# Patient Record
Sex: Female | Born: 1975 | Race: White | Hispanic: Yes | Marital: Married | Smoking: Never smoker
Health system: Southern US, Community
[De-identification: ages and names within clinical notes are randomized; demographics above are authoritative.]

## PROBLEM LIST (undated history)

## (undated) DIAGNOSIS — E119 Type 2 diabetes mellitus without complications: Secondary | ICD-10-CM

## (undated) DIAGNOSIS — R87619 Unspecified abnormal cytological findings in specimens from cervix uteri: Secondary | ICD-10-CM

## (undated) DIAGNOSIS — K219 Gastro-esophageal reflux disease without esophagitis: Secondary | ICD-10-CM

## (undated) DIAGNOSIS — Z9109 Other allergy status, other than to drugs and biological substances: Secondary | ICD-10-CM

## (undated) DIAGNOSIS — J45909 Unspecified asthma, uncomplicated: Secondary | ICD-10-CM

## (undated) HISTORY — PX: CERVICAL BIOPSY  W/ LOOP ELECTRODE EXCISION: SUR135

---

## 2001-05-21 ENCOUNTER — Encounter: Payer: Self-pay | Admitting: Emergency Medicine

## 2001-05-21 ENCOUNTER — Emergency Department (HOSPITAL_COMMUNITY): Admission: EM | Admit: 2001-05-21 | Discharge: 2001-05-22 | Payer: Self-pay | Admitting: Emergency Medicine

## 2001-06-19 ENCOUNTER — Emergency Department (HOSPITAL_COMMUNITY): Admission: EM | Admit: 2001-06-19 | Discharge: 2001-06-19 | Payer: Self-pay

## 2001-06-20 ENCOUNTER — Emergency Department (HOSPITAL_COMMUNITY): Admission: EM | Admit: 2001-06-20 | Discharge: 2001-06-20 | Payer: Self-pay | Admitting: Emergency Medicine

## 2001-07-25 ENCOUNTER — Emergency Department (HOSPITAL_COMMUNITY): Admission: EM | Admit: 2001-07-25 | Discharge: 2001-07-26 | Payer: Self-pay | Admitting: Emergency Medicine

## 2001-09-14 ENCOUNTER — Encounter: Payer: Self-pay | Admitting: *Deleted

## 2001-09-14 ENCOUNTER — Inpatient Hospital Stay (HOSPITAL_COMMUNITY): Admission: AD | Admit: 2001-09-14 | Discharge: 2001-09-14 | Payer: Self-pay | Admitting: Obstetrics & Gynecology

## 2001-10-21 ENCOUNTER — Emergency Department (HOSPITAL_COMMUNITY): Admission: EM | Admit: 2001-10-21 | Discharge: 2001-10-21 | Payer: Self-pay | Admitting: Emergency Medicine

## 2001-12-27 ENCOUNTER — Other Ambulatory Visit: Admission: RE | Admit: 2001-12-27 | Discharge: 2001-12-27 | Payer: Self-pay | Admitting: Obstetrics and Gynecology

## 2002-01-23 ENCOUNTER — Encounter: Admission: RE | Admit: 2002-01-23 | Discharge: 2002-04-23 | Payer: Self-pay | Admitting: Obstetrics and Gynecology

## 2002-02-05 ENCOUNTER — Inpatient Hospital Stay (HOSPITAL_COMMUNITY): Admission: AD | Admit: 2002-02-05 | Discharge: 2002-02-05 | Payer: Self-pay | Admitting: Obstetrics and Gynecology

## 2002-02-07 ENCOUNTER — Inpatient Hospital Stay (HOSPITAL_COMMUNITY): Admission: AD | Admit: 2002-02-07 | Discharge: 2002-02-07 | Payer: Self-pay | Admitting: *Deleted

## 2002-02-08 ENCOUNTER — Encounter: Payer: Self-pay | Admitting: *Deleted

## 2002-02-08 ENCOUNTER — Observation Stay (HOSPITAL_COMMUNITY): Admission: AD | Admit: 2002-02-08 | Discharge: 2002-02-09 | Payer: Self-pay | Admitting: *Deleted

## 2002-02-12 ENCOUNTER — Encounter (HOSPITAL_COMMUNITY): Admission: RE | Admit: 2002-02-12 | Discharge: 2002-02-12 | Payer: Self-pay | Admitting: *Deleted

## 2002-02-14 ENCOUNTER — Inpatient Hospital Stay (HOSPITAL_COMMUNITY): Admission: AD | Admit: 2002-02-14 | Discharge: 2002-02-16 | Payer: Self-pay | Admitting: *Deleted

## 2002-05-16 ENCOUNTER — Emergency Department (HOSPITAL_COMMUNITY): Admission: EM | Admit: 2002-05-16 | Discharge: 2002-05-16 | Payer: Self-pay | Admitting: *Deleted

## 2003-01-03 ENCOUNTER — Encounter: Payer: Self-pay | Admitting: Emergency Medicine

## 2003-01-03 ENCOUNTER — Emergency Department (HOSPITAL_COMMUNITY): Admission: EM | Admit: 2003-01-03 | Discharge: 2003-01-04 | Payer: Self-pay | Admitting: Emergency Medicine

## 2003-11-13 ENCOUNTER — Emergency Department (HOSPITAL_COMMUNITY): Admission: EM | Admit: 2003-11-13 | Discharge: 2003-11-13 | Payer: Self-pay | Admitting: Emergency Medicine

## 2003-12-02 ENCOUNTER — Encounter: Admission: RE | Admit: 2003-12-02 | Discharge: 2003-12-02 | Payer: Self-pay | Admitting: Obstetrics and Gynecology

## 2003-12-29 ENCOUNTER — Ambulatory Visit (HOSPITAL_COMMUNITY): Admission: RE | Admit: 2003-12-29 | Discharge: 2003-12-29 | Payer: Self-pay | Admitting: Obstetrics and Gynecology

## 2003-12-29 ENCOUNTER — Encounter (INDEPENDENT_AMBULATORY_CARE_PROVIDER_SITE_OTHER): Payer: Self-pay | Admitting: *Deleted

## 2004-01-04 ENCOUNTER — Inpatient Hospital Stay (HOSPITAL_COMMUNITY): Admission: AD | Admit: 2004-01-04 | Discharge: 2004-01-04 | Payer: Self-pay | Admitting: Family Medicine

## 2004-01-07 ENCOUNTER — Emergency Department (HOSPITAL_COMMUNITY): Admission: EM | Admit: 2004-01-07 | Discharge: 2004-01-07 | Payer: Self-pay | Admitting: Emergency Medicine

## 2004-03-28 ENCOUNTER — Emergency Department (HOSPITAL_COMMUNITY): Admission: EM | Admit: 2004-03-28 | Discharge: 2004-03-28 | Payer: Self-pay | Admitting: Emergency Medicine

## 2004-07-16 ENCOUNTER — Ambulatory Visit: Payer: Self-pay | Admitting: Family Medicine

## 2004-08-12 ENCOUNTER — Emergency Department (HOSPITAL_COMMUNITY): Admission: EM | Admit: 2004-08-12 | Discharge: 2004-08-13 | Payer: Self-pay | Admitting: Emergency Medicine

## 2004-11-12 ENCOUNTER — Emergency Department (HOSPITAL_COMMUNITY): Admission: EM | Admit: 2004-11-12 | Discharge: 2004-11-12 | Payer: Self-pay | Admitting: Emergency Medicine

## 2004-11-19 ENCOUNTER — Emergency Department (HOSPITAL_COMMUNITY): Admission: EM | Admit: 2004-11-19 | Discharge: 2004-11-19 | Payer: Self-pay | Admitting: Emergency Medicine

## 2004-12-31 ENCOUNTER — Emergency Department (HOSPITAL_COMMUNITY): Admission: EM | Admit: 2004-12-31 | Discharge: 2005-01-01 | Payer: Self-pay | Admitting: Emergency Medicine

## 2005-08-27 ENCOUNTER — Emergency Department (HOSPITAL_COMMUNITY): Admission: EM | Admit: 2005-08-27 | Discharge: 2005-08-28 | Payer: Self-pay | Admitting: Emergency Medicine

## 2005-10-11 ENCOUNTER — Emergency Department (HOSPITAL_COMMUNITY): Admission: EM | Admit: 2005-10-11 | Discharge: 2005-10-11 | Payer: Self-pay | Admitting: Emergency Medicine

## 2005-10-12 ENCOUNTER — Emergency Department (HOSPITAL_COMMUNITY): Admission: EM | Admit: 2005-10-12 | Discharge: 2005-10-13 | Payer: Self-pay | Admitting: Emergency Medicine

## 2005-10-14 ENCOUNTER — Emergency Department (HOSPITAL_COMMUNITY): Admission: EM | Admit: 2005-10-14 | Discharge: 2005-10-14 | Payer: Self-pay | Admitting: Family Medicine

## 2005-10-17 ENCOUNTER — Emergency Department (HOSPITAL_COMMUNITY): Admission: EM | Admit: 2005-10-17 | Discharge: 2005-10-17 | Payer: Self-pay | Admitting: Family Medicine

## 2005-10-21 ENCOUNTER — Emergency Department (HOSPITAL_COMMUNITY): Admission: EM | Admit: 2005-10-21 | Discharge: 2005-10-21 | Payer: Self-pay | Admitting: Family Medicine

## 2005-10-21 ENCOUNTER — Ambulatory Visit (HOSPITAL_COMMUNITY): Admission: RE | Admit: 2005-10-21 | Discharge: 2005-10-21 | Payer: Self-pay | Admitting: Family Medicine

## 2006-04-01 ENCOUNTER — Emergency Department (HOSPITAL_COMMUNITY): Admission: EM | Admit: 2006-04-01 | Discharge: 2006-04-02 | Payer: Self-pay | Admitting: Emergency Medicine

## 2006-05-09 ENCOUNTER — Ambulatory Visit (HOSPITAL_COMMUNITY): Admission: RE | Admit: 2006-05-09 | Discharge: 2006-05-09 | Payer: Self-pay | Admitting: Family Medicine

## 2006-05-09 ENCOUNTER — Ambulatory Visit: Payer: Self-pay | Admitting: Family Medicine

## 2006-05-11 ENCOUNTER — Ambulatory Visit: Payer: Self-pay | Admitting: *Deleted

## 2006-05-13 ENCOUNTER — Emergency Department (HOSPITAL_COMMUNITY): Admission: EM | Admit: 2006-05-13 | Discharge: 2006-05-14 | Payer: Self-pay | Admitting: Emergency Medicine

## 2006-05-30 ENCOUNTER — Ambulatory Visit: Payer: Self-pay | Admitting: Family Medicine

## 2006-05-31 ENCOUNTER — Inpatient Hospital Stay (HOSPITAL_COMMUNITY): Admission: AD | Admit: 2006-05-31 | Discharge: 2006-05-31 | Payer: Self-pay | Admitting: Gynecology

## 2006-06-07 ENCOUNTER — Inpatient Hospital Stay (HOSPITAL_COMMUNITY): Admission: RE | Admit: 2006-06-07 | Discharge: 2006-06-07 | Payer: Self-pay | Admitting: Gynecology

## 2006-06-28 ENCOUNTER — Inpatient Hospital Stay (HOSPITAL_COMMUNITY): Admission: AD | Admit: 2006-06-28 | Discharge: 2006-06-28 | Payer: Self-pay | Admitting: Gynecology

## 2006-08-17 ENCOUNTER — Inpatient Hospital Stay: Admission: AD | Admit: 2006-08-17 | Discharge: 2006-08-17 | Payer: Self-pay | Admitting: Obstetrics and Gynecology

## 2006-09-07 ENCOUNTER — Ambulatory Visit (HOSPITAL_COMMUNITY): Admission: RE | Admit: 2006-09-07 | Discharge: 2006-09-07 | Payer: Self-pay | Admitting: Obstetrics & Gynecology

## 2006-10-06 ENCOUNTER — Ambulatory Visit (HOSPITAL_COMMUNITY): Admission: RE | Admit: 2006-10-06 | Discharge: 2006-10-06 | Payer: Self-pay | Admitting: Obstetrics & Gynecology

## 2006-11-03 ENCOUNTER — Ambulatory Visit: Payer: Self-pay | Admitting: Vascular Surgery

## 2006-11-03 ENCOUNTER — Ambulatory Visit: Admission: RE | Admit: 2006-11-03 | Discharge: 2006-11-03 | Payer: Self-pay | Admitting: Family Medicine

## 2006-12-10 ENCOUNTER — Inpatient Hospital Stay (HOSPITAL_COMMUNITY): Admission: AD | Admit: 2006-12-10 | Discharge: 2006-12-10 | Payer: Self-pay | Admitting: Obstetrics & Gynecology

## 2006-12-10 ENCOUNTER — Ambulatory Visit: Payer: Self-pay | Admitting: Obstetrics & Gynecology

## 2007-01-07 ENCOUNTER — Emergency Department (HOSPITAL_COMMUNITY): Admission: EM | Admit: 2007-01-07 | Discharge: 2007-01-07 | Payer: Self-pay | Admitting: Emergency Medicine

## 2007-01-15 ENCOUNTER — Inpatient Hospital Stay (HOSPITAL_COMMUNITY): Admission: AD | Admit: 2007-01-15 | Discharge: 2007-01-15 | Payer: Self-pay | Admitting: Obstetrics & Gynecology

## 2007-01-25 ENCOUNTER — Inpatient Hospital Stay (HOSPITAL_COMMUNITY): Admission: AD | Admit: 2007-01-25 | Discharge: 2007-01-28 | Payer: Self-pay | Admitting: Gynecology

## 2007-01-26 ENCOUNTER — Ambulatory Visit: Payer: Self-pay | Admitting: Obstetrics & Gynecology

## 2007-02-06 ENCOUNTER — Ambulatory Visit: Payer: Self-pay | Admitting: Certified Nurse Midwife

## 2007-02-06 ENCOUNTER — Inpatient Hospital Stay (HOSPITAL_COMMUNITY): Admission: AD | Admit: 2007-02-06 | Discharge: 2007-02-06 | Payer: Self-pay | Admitting: Obstetrics & Gynecology

## 2007-02-08 ENCOUNTER — Emergency Department (HOSPITAL_COMMUNITY): Admission: EM | Admit: 2007-02-08 | Discharge: 2007-02-08 | Payer: Self-pay | Admitting: Emergency Medicine

## 2007-02-20 ENCOUNTER — Ambulatory Visit: Payer: Self-pay | Admitting: Family Medicine

## 2007-03-22 ENCOUNTER — Ambulatory Visit: Payer: Self-pay | Admitting: Family Medicine

## 2007-03-31 ENCOUNTER — Emergency Department (HOSPITAL_COMMUNITY): Admission: EM | Admit: 2007-03-31 | Discharge: 2007-04-01 | Payer: Self-pay | Admitting: Emergency Medicine

## 2007-04-05 ENCOUNTER — Ambulatory Visit: Payer: Self-pay | Admitting: Family Medicine

## 2007-04-24 ENCOUNTER — Emergency Department (HOSPITAL_COMMUNITY): Admission: EM | Admit: 2007-04-24 | Discharge: 2007-04-24 | Payer: Self-pay | Admitting: Emergency Medicine

## 2007-06-06 ENCOUNTER — Encounter (INDEPENDENT_AMBULATORY_CARE_PROVIDER_SITE_OTHER): Payer: Self-pay | Admitting: *Deleted

## 2007-09-21 ENCOUNTER — Ambulatory Visit: Payer: Self-pay | Admitting: Internal Medicine

## 2007-10-15 ENCOUNTER — Encounter: Payer: Self-pay | Admitting: Family Medicine

## 2007-10-15 ENCOUNTER — Encounter (INDEPENDENT_AMBULATORY_CARE_PROVIDER_SITE_OTHER): Payer: Self-pay | Admitting: Internal Medicine

## 2007-10-15 ENCOUNTER — Ambulatory Visit: Payer: Self-pay | Admitting: Family Medicine

## 2007-10-15 LAB — CONVERTED CEMR LAB
Chlamydia, DNA Probe: NEGATIVE
GC Probe Amp, Genital: NEGATIVE

## 2008-01-30 ENCOUNTER — Emergency Department (HOSPITAL_COMMUNITY): Admission: EM | Admit: 2008-01-30 | Discharge: 2008-01-30 | Payer: Self-pay | Admitting: Emergency Medicine

## 2008-04-16 ENCOUNTER — Emergency Department (HOSPITAL_COMMUNITY): Admission: EM | Admit: 2008-04-16 | Discharge: 2008-04-17 | Payer: Self-pay | Admitting: Emergency Medicine

## 2008-05-02 ENCOUNTER — Ambulatory Visit: Payer: Self-pay | Admitting: Internal Medicine

## 2008-10-16 ENCOUNTER — Ambulatory Visit: Payer: Self-pay | Admitting: Internal Medicine

## 2008-10-24 ENCOUNTER — Emergency Department (HOSPITAL_COMMUNITY): Admission: EM | Admit: 2008-10-24 | Discharge: 2008-10-25 | Payer: Self-pay | Admitting: Emergency Medicine

## 2008-10-27 ENCOUNTER — Emergency Department (HOSPITAL_COMMUNITY): Admission: EM | Admit: 2008-10-27 | Discharge: 2008-10-27 | Payer: Self-pay | Admitting: Emergency Medicine

## 2008-10-29 ENCOUNTER — Ambulatory Visit: Payer: Self-pay | Admitting: Internal Medicine

## 2008-10-31 ENCOUNTER — Ambulatory Visit: Payer: Self-pay | Admitting: Family Medicine

## 2008-12-10 ENCOUNTER — Ambulatory Visit: Payer: Self-pay | Admitting: Internal Medicine

## 2009-06-18 ENCOUNTER — Emergency Department (HOSPITAL_COMMUNITY): Admission: EM | Admit: 2009-06-18 | Discharge: 2009-06-18 | Payer: Self-pay | Admitting: Emergency Medicine

## 2009-06-21 ENCOUNTER — Emergency Department (HOSPITAL_COMMUNITY): Admission: EM | Admit: 2009-06-21 | Discharge: 2009-06-21 | Payer: Self-pay | Admitting: Emergency Medicine

## 2009-06-29 ENCOUNTER — Ambulatory Visit: Payer: Self-pay | Admitting: Internal Medicine

## 2009-08-20 ENCOUNTER — Ambulatory Visit: Payer: Self-pay | Admitting: Family Medicine

## 2009-09-19 HISTORY — PX: OTHER SURGICAL HISTORY: SHX169

## 2009-10-15 ENCOUNTER — Ambulatory Visit (HOSPITAL_COMMUNITY): Admission: RE | Admit: 2009-10-15 | Discharge: 2009-10-15 | Payer: Self-pay | Admitting: Family Medicine

## 2009-10-15 ENCOUNTER — Ambulatory Visit: Payer: Self-pay | Admitting: Family Medicine

## 2009-10-15 LAB — CONVERTED CEMR LAB
AST: 12 units/L (ref 0–37)
Albumin: 4 g/dL (ref 3.5–5.2)
Alkaline Phosphatase: 116 units/L (ref 39–117)
BUN: 12 mg/dL (ref 6–23)
Creatinine, Ser: 0.67 mg/dL (ref 0.40–1.20)
Glucose, Bld: 99 mg/dL (ref 70–99)
Potassium: 3.8 meq/L (ref 3.5–5.3)
Total Bilirubin: 0.2 mg/dL — ABNORMAL LOW (ref 0.3–1.2)

## 2010-03-04 ENCOUNTER — Ambulatory Visit: Payer: Self-pay | Admitting: Family Medicine

## 2010-03-04 LAB — CONVERTED CEMR LAB
Eosinophils Absolute: 0.3 10*3/uL (ref 0.0–0.7)
Eosinophils Relative: 3 % (ref 0–5)
Free T4: 0.89 ng/dL (ref 0.80–1.80)
HCT: 41.1 % (ref 36.0–46.0)
Hemoglobin: 13.5 g/dL (ref 12.0–15.0)
Lymphocytes Relative: 33 % (ref 12–46)
Lymphs Abs: 2.8 10*3/uL (ref 0.7–4.0)
MCV: 89.7 fL (ref 78.0–100.0)
Monocytes Relative: 7 % (ref 3–12)
Platelets: 342 10*3/uL (ref 150–400)
RBC: 4.58 M/uL (ref 3.87–5.11)
Vit D, 25-Hydroxy: 24 ng/mL — ABNORMAL LOW (ref 30–89)
WBC: 8.5 10*3/uL (ref 4.0–10.5)

## 2010-04-06 ENCOUNTER — Ambulatory Visit: Payer: Self-pay | Admitting: Family Medicine

## 2010-04-14 ENCOUNTER — Emergency Department (HOSPITAL_COMMUNITY): Admission: EM | Admit: 2010-04-14 | Discharge: 2010-04-14 | Payer: Self-pay | Admitting: Emergency Medicine

## 2010-04-15 ENCOUNTER — Ambulatory Visit: Payer: Self-pay | Admitting: Internal Medicine

## 2010-04-29 ENCOUNTER — Ambulatory Visit: Payer: Self-pay | Admitting: Internal Medicine

## 2010-04-30 ENCOUNTER — Encounter: Admission: RE | Admit: 2010-04-30 | Discharge: 2010-04-30 | Payer: Self-pay | Admitting: Family Medicine

## 2010-06-17 ENCOUNTER — Ambulatory Visit: Payer: Self-pay | Admitting: Obstetrics and Gynecology

## 2010-06-24 ENCOUNTER — Ambulatory Visit (HOSPITAL_COMMUNITY): Admission: RE | Admit: 2010-06-24 | Discharge: 2010-06-24 | Payer: Self-pay | Admitting: Obstetrics and Gynecology

## 2010-07-07 ENCOUNTER — Ambulatory Visit: Payer: Self-pay | Admitting: Obstetrics & Gynecology

## 2010-10-09 ENCOUNTER — Encounter: Payer: Self-pay | Admitting: *Deleted

## 2010-12-03 ENCOUNTER — Emergency Department (HOSPITAL_COMMUNITY)
Admission: EM | Admit: 2010-12-03 | Discharge: 2010-12-04 | Disposition: A | Discharge status: Home / Self Care | Payer: Self-pay | Attending: Emergency Medicine | Admitting: Emergency Medicine

## 2010-12-03 ENCOUNTER — Emergency Department (HOSPITAL_COMMUNITY): Payer: Self-pay

## 2010-12-03 DIAGNOSIS — J45909 Unspecified asthma, uncomplicated: Secondary | ICD-10-CM | POA: Insufficient documentation

## 2010-12-03 DIAGNOSIS — R112 Nausea with vomiting, unspecified: Secondary | ICD-10-CM | POA: Insufficient documentation

## 2010-12-03 DIAGNOSIS — L989 Disorder of the skin and subcutaneous tissue, unspecified: Secondary | ICD-10-CM | POA: Insufficient documentation

## 2010-12-03 DIAGNOSIS — R079 Chest pain, unspecified: Secondary | ICD-10-CM | POA: Insufficient documentation

## 2010-12-03 DIAGNOSIS — R3915 Urgency of urination: Secondary | ICD-10-CM | POA: Insufficient documentation

## 2010-12-03 DIAGNOSIS — K2990 Gastroduodenitis, unspecified, without bleeding: Secondary | ICD-10-CM | POA: Insufficient documentation

## 2010-12-03 DIAGNOSIS — K2970 Gastritis, unspecified, without bleeding: Secondary | ICD-10-CM | POA: Insufficient documentation

## 2010-12-03 DIAGNOSIS — R1013 Epigastric pain: Secondary | ICD-10-CM | POA: Insufficient documentation

## 2010-12-03 DIAGNOSIS — R350 Frequency of micturition: Secondary | ICD-10-CM | POA: Insufficient documentation

## 2010-12-03 LAB — CBC
Hemoglobin: 11.4 g/dL — ABNORMAL LOW (ref 12.0–15.0)
MCV: 85.4 fL (ref 78.0–100.0)
Platelets: 323 10*3/uL (ref 150–400)
RBC: 3.9 MIL/uL (ref 3.87–5.11)
WBC: 11.3 10*3/uL — ABNORMAL HIGH (ref 4.0–10.5)

## 2010-12-03 LAB — DIFFERENTIAL
Basophils Relative: 0 % (ref 0–1)
Eosinophils Absolute: 0.3 10*3/uL (ref 0.0–0.7)
Lymphs Abs: 3.6 10*3/uL (ref 0.7–4.0)
Neutro Abs: 6.4 10*3/uL (ref 1.7–7.7)
Neutrophils Relative %: 57 % (ref 43–77)

## 2010-12-03 LAB — COMPREHENSIVE METABOLIC PANEL
Albumin: 3.1 g/dL — ABNORMAL LOW (ref 3.5–5.2)
BUN: 8 mg/dL (ref 6–23)
Creatinine, Ser: 0.5 mg/dL (ref 0.4–1.2)
GFR calc Af Amer: >60 mL/min (ref >60)
Total Protein: 6.7 g/dL (ref 6.0–8.3)

## 2010-12-03 LAB — POCT CARDIAC MARKERS
CKMB, poc: <1 ng/mL — ABNORMAL LOW (ref 1.0–8.0)
Troponin i, poc: <0.05 ng/mL (ref 0.00–0.09)

## 2010-12-04 LAB — URINALYSIS, ROUTINE W REFLEX MICROSCOPIC
Nitrite: NEGATIVE
Specific Gravity, Urine: 1.009 (ref 1.005–1.030)
pH: 6 (ref 5.0–8.0)

## 2010-12-04 LAB — POCT PREGNANCY, URINE
Preg Test, Ur: NEGATIVE
Preg Test, Ur: NEGATIVE

## 2010-12-05 LAB — URINE CULTURE

## 2010-12-23 LAB — POCT I-STAT, CHEM 8
Chloride: 105 mEq/L (ref 96–112)
Glucose, Bld: 97 mg/dL (ref 70–99)
HCT: 49 % — ABNORMAL HIGH (ref 36.0–46.0)
Potassium: 3.8 mEq/L (ref 3.5–5.1)

## 2010-12-24 LAB — URINE MICROSCOPIC-ADD ON

## 2010-12-24 LAB — DIFFERENTIAL
Basophils Absolute: 0 10*3/uL (ref 0.0–0.1)
Basophils Relative: 0 % (ref 0–1)
Neutro Abs: 11.2 10*3/uL — ABNORMAL HIGH (ref 1.7–7.7)
Neutrophils Relative %: 88 % — ABNORMAL HIGH (ref 43–77)

## 2010-12-24 LAB — URINALYSIS, ROUTINE W REFLEX MICROSCOPIC
Hgb urine dipstick: NEGATIVE
Nitrite: NEGATIVE
Protein, ur: NEGATIVE mg/dL
Specific Gravity, Urine: 1.024 (ref 1.005–1.030)
Urobilinogen, UA: 0.2 mg/dL (ref 0.0–1.0)

## 2010-12-24 LAB — COMPREHENSIVE METABOLIC PANEL
Alkaline Phosphatase: 130 U/L — ABNORMAL HIGH (ref 39–117)
BUN: 7 mg/dL (ref 6–23)
Chloride: 103 mEq/L (ref 96–112)
GFR calc non Af Amer: >60 mL/min (ref >60)
Glucose, Bld: 146 mg/dL — ABNORMAL HIGH (ref 70–99)
Potassium: 3.8 mEq/L (ref 3.5–5.1)
Total Bilirubin: 0.5 mg/dL (ref 0.3–1.2)
Total Protein: 8.5 g/dL — ABNORMAL HIGH (ref 6.0–8.3)

## 2010-12-24 LAB — CBC
HCT: 42.4 % (ref 36.0–46.0)
Hemoglobin: 14.6 g/dL (ref 12.0–15.0)
RDW: 12.6 % (ref 11.5–15.5)

## 2010-12-24 LAB — POCT CARDIAC MARKERS
Myoglobin, poc: 54.6 ng/mL (ref 12–200)
Troponin i, poc: <0.05 ng/mL (ref 0.00–0.09)

## 2010-12-24 LAB — LIPASE, BLOOD: Lipase: 16 U/L (ref 11–59)

## 2010-12-24 LAB — PROTIME-INR
INR: 1.3 (ref 0.00–1.49)
Prothrombin Time: 15.8 seconds — ABNORMAL HIGH (ref 11.6–15.2)

## 2010-12-24 LAB — GLUCOSE, CAPILLARY

## 2011-01-04 LAB — CBC
HCT: 37.1 % (ref 36.0–46.0)
Platelets: 329 10*3/uL (ref 150–400)
RBC: 4.26 MIL/uL (ref 3.87–5.11)
WBC: 11.9 10*3/uL — ABNORMAL HIGH (ref 4.0–10.5)

## 2011-01-04 LAB — URINALYSIS, ROUTINE W REFLEX MICROSCOPIC
Bilirubin Urine: NEGATIVE
Ketones, ur: NEGATIVE mg/dL
Nitrite: NEGATIVE
Urobilinogen, UA: 1 mg/dL (ref 0.0–1.0)

## 2011-01-04 LAB — CULTURE, ROUTINE-ABSCESS

## 2011-01-04 LAB — POCT I-STAT, CHEM 8
BUN: 10 mg/dL (ref 6–23)
Creatinine, Ser: 0.6 mg/dL (ref 0.4–1.2)
Potassium: 3.2 mEq/L — ABNORMAL LOW (ref 3.5–5.1)
Sodium: 141 mEq/L (ref 135–145)

## 2011-01-04 LAB — DIFFERENTIAL
Eosinophils Relative: 5 % (ref 0–5)
Lymphocytes Relative: 26 % (ref 12–46)
Lymphs Abs: 3.1 10*3/uL (ref 0.7–4.0)
Neutrophils Relative %: 61 % (ref 43–77)

## 2011-01-04 LAB — WET PREP, GENITAL: Yeast Wet Prep HPF POC: NONE SEEN

## 2011-01-04 LAB — URINE MICROSCOPIC-ADD ON

## 2011-02-04 NOTE — Group Therapy Note (Signed)
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  The patient had profuse bleeding on the colposcopic biopsy and  therefore we decided to do the LEEP in the hospital.  The patient has no  significant past medical history although she was a gestational diabetic  during her pregnancy and we will get a metabolic panel.  She has no  allergies, takes no medication on a regular basis, and is using Depo-Provera  for contraception.  She has had no history of surgery in the past with the  exception of three normal deliveries.   IMPRESSION:  Moderate dysplasia, cervical intraepithelial neoplasia grade 2.  To be scheduled for inpatient LEEP because of a history of excessive  bleeding on colposcopy.                                               Argentina Donovan, MD    PR/MEDQ  D:  12/02/2003  T:  12/02/2003  Job:  [id number]

## 2011-02-04 NOTE — Group Therapy Note (Signed)
NAMETASHEENA, WAMBOLT NO.:  [id number]   MEDICAL RECORD NO.:  [id number]          PATIENT TYPE:  WOC   LOCATION:  [facility]                   FACILITY:  [facility]   PHYSICIAN:  Elsie Lincoln, MD      DATE OF BIRTH:  15-Jan-1976   DATE OF SERVICE:  07/16/2004                                    CLINIC NOTE   CHIEF COMPLAINT:  Referral for right lower quadrant mass.   SUBJECTIVE:  Alexandra Aguirre is here today for referral from [facility] on  [address].  She has two separate complaints.  One is of a right lower  quadrant growth on her abdomen.  She states she noticed it after her son was  born 2 years ago and it has slowly gotten bigger.  She says it is usually  nontender but there is some pain if she pushes on it.  There is no change  with coughing or Valsalva.  No change with bowel movements and she has had  no change in her bowel movements.  Denies any fevers and no external skin  changes.  Denies any trauma to that area.   Her second complaint is of suprapubic abdominal pain.  She states that is  also worse with bending over.  Denies any dyspareunia but her husband is in  the room with her.  States she has occasional dysuria off and on for the  last 1-2 months.  No hematuria.  She has a history of some white-colored  vaginal discharge but none currently.  She does have a history of a LEEP  back on December 29, 2003 for an abnormal colposcopy with CIN-2 to CIN-3.  She  has a follow-up Pap smear done in September at [facility] which was  normal.   OBSTETRICAL AND GYNECOLOGICAL HISTORY:  She is a G3 P3-0-0-3.  She is on  Depo-Provera for birth control.   PAST MEDICAL PROBLEMS:  Negative.   PAST SURGERIES:  LEEP.   OBJECTIVE:  VITAL SIGNS:  Noted.  GENERAL:  She is an overweight, short, Hispanic female in no acute distress.  ABDOMEN:  Soft, nontender, nondistended, no hepatosplenomegaly.  There is a  small, mobile, 2 cm x 2 cm nodule to the right of her umbilicus  near the  right lower quadrant.  There is no skin dimpling, no tenderness to  palpation, no drainage from the lesion.  PELVIC:  Reveals normal external female genitalia, normal urethra and  vaginal walls.  The cervix appears quite short and adherent to the vaginal  wall.  There are no lesions noted and there is no discharge noted.   ASSESSMENT AND PLAN:  1.  Skin nodule.  I suspect this is either a fibroadenoma or a lipoma,      although it is somewhat firm to be a lipoma.  Due to the fact that it      has increased in size in the last 2 years I am going to refer her to      dermatology through the [employer] since she is self-      pay.  That paperwork needs to be filled out before she can be referred.      [facility] does accept [facility].  2.  Suprapubic pain.  This is likely just secondary to her LEEP procedure.      I have checked a urinalysis to rule out infection.  I do not see      anything on the abdomen that is concerning.   She is to return to have her [employer] paperwork filled  out with a translator and then should return to [facility] for follow-up  Pap smears from her LEEP procedure.      LC/MEDQ  D:  07/16/2004  T:  07/16/2004  Job:  [id number]

## 2011-02-04 NOTE — Op Note (Signed)
Alexandra Aguirre, Alexandra Aguirre                            ACCOUNT NO.:  [id number]   MEDICAL RECORD NO.:  [id number]                   PATIENT TYPE:  AMB   LOCATION:  [facility]                                  [facility]   PHYSICIAN:  Phil D. Okey Dupre, M.D.                  DATE OF BIRTH:  1975-09-22   DATE OF PROCEDURE:  12/29/2003  DATE OF DISCHARGE:                                 OPERATIVE REPORT   PREOPERATIVE DIAGNOSIS:  Moderate cervical dysplasia.   POSTOPERATIVE DIAGNOSIS:  Pending pathology report.   PROCEDURE:  Loop electrosurgical excision procedure conization of the  cervix.   SURGEON:  Javier Glazier. Okey Dupre, M.D.   ANESTHESIA:  General.   The procedure went as follows:  Prior to the procedure with an interpreter  present, we discussed the procedure with the patient, possible  complications, and limitations of activity afterwards.  We also told her  that usually we do this in the clinic but because when they did the  colposcopy she had a problem with bleeding, so that could be better handled  in the OR if bleeding happens.   The procedure went as follows:  Under satisfactory general anesthesia, the  patient in the dorsal lithotomy position, a Graves speculum that was  insulated was placed in the vagina to isolate the cervix.  In the middle of  the speculum, lateral retractors were then inserted and using a 2 x 0.8 cm  LEEP loop with 45 watts of blended cutting, the LEEP biopsy was taken after  lateral sutures of 1 chromic catgut were placed in both of the lateral  cervical angles because of the risk of bleeding in this patient.  Hot  cautery was then used using 50 watts and a large ball coagulating device to  control the bleeding as much as possible, although she did bleed rather  briskly.  Several other figure-of-eight sutures were placed in the area of  the biopsy site for bleeding control and eventually packed with Monsel's  solution, the bleeding seemed to be completely controlled at  the end of the  procedure.  The biopsy specimen was pinned at 12 o'clock on the cork and  sent for pathologic diagnosis.  The patient was then transferred to the  recovery room in satisfactory condition, having tolerated the procedure  well.                                               Phil D. Okey Dupre, M.D.    PDR/MEDQ  D:  12/29/2003  T:  12/30/2003  Job:  [id number]

## 2011-04-01 ENCOUNTER — Other Ambulatory Visit (HOSPITAL_COMMUNITY): Payer: Self-pay | Admitting: Family Medicine

## 2011-04-01 ENCOUNTER — Other Ambulatory Visit: Payer: Self-pay | Admitting: Family Medicine

## 2011-04-01 DIAGNOSIS — R102 Pelvic and perineal pain: Secondary | ICD-10-CM

## 2011-04-05 ENCOUNTER — Ambulatory Visit (HOSPITAL_COMMUNITY)
Admission: RE | Admit: 2011-04-05 | Discharge: 2011-04-05 | Disposition: A | Discharge status: Home / Self Care | Payer: Self-pay | Source: Ambulatory Visit | Attending: Family Medicine | Admitting: Family Medicine

## 2011-04-05 ENCOUNTER — Other Ambulatory Visit (HOSPITAL_COMMUNITY): Payer: Self-pay | Admitting: Family Medicine

## 2011-04-05 DIAGNOSIS — R1031 Right lower quadrant pain: Secondary | ICD-10-CM | POA: Insufficient documentation

## 2011-04-05 DIAGNOSIS — R102 Pelvic and perineal pain: Secondary | ICD-10-CM

## 2011-06-15 LAB — POCT I-STAT, CHEM 8
BUN: 11
Creatinine, Ser: 0.6
Glucose, Bld: 111 — ABNORMAL HIGH
Hemoglobin: 15.3 — ABNORMAL HIGH
Potassium: 4.2

## 2011-06-15 LAB — CBC
HCT: 41.4
MCV: 87.1
RBC: 4.76
WBC: 11 — ABNORMAL HIGH

## 2011-06-15 LAB — DIFFERENTIAL
Eosinophils Absolute: 0.6
Lymphs Abs: 2.6
Monocytes Relative: 8
Neutrophils Relative %: 63

## 2011-06-17 LAB — URINE MICROSCOPIC-ADD ON

## 2011-06-17 LAB — URINALYSIS, ROUTINE W REFLEX MICROSCOPIC
Bilirubin Urine: NEGATIVE
Glucose, UA: NEGATIVE
Hgb urine dipstick: NEGATIVE
Ketones, ur: NEGATIVE
Nitrite: NEGATIVE
Protein, ur: NEGATIVE
Specific Gravity, Urine: 1.018
Urobilinogen, UA: 0.2
pH: 5.5

## 2011-06-17 LAB — URINE CULTURE: Colony Count: 85000

## 2011-06-17 LAB — POCT PREGNANCY, URINE: Preg Test, Ur: NEGATIVE

## 2011-07-03 ENCOUNTER — Emergency Department (HOSPITAL_COMMUNITY)
Admission: EM | Admit: 2011-07-03 | Discharge: 2011-07-03 | Disposition: A | Discharge status: Home / Self Care | Payer: Self-pay | Attending: Emergency Medicine | Admitting: Emergency Medicine

## 2011-07-03 ENCOUNTER — Emergency Department (HOSPITAL_COMMUNITY): Payer: Self-pay

## 2011-07-03 DIAGNOSIS — M79609 Pain in unspecified limb: Secondary | ICD-10-CM | POA: Insufficient documentation

## 2011-07-03 DIAGNOSIS — M25579 Pain in unspecified ankle and joints of unspecified foot: Secondary | ICD-10-CM | POA: Insufficient documentation

## 2011-07-03 DIAGNOSIS — J45909 Unspecified asthma, uncomplicated: Secondary | ICD-10-CM | POA: Insufficient documentation

## 2011-07-04 LAB — COMPREHENSIVE METABOLIC PANEL
Albumin: 3.3 — ABNORMAL LOW
Alkaline Phosphatase: 129 — ABNORMAL HIGH
BUN: 9
CO2: 25
Chloride: 106
Creatinine, Ser: 0.61
GFR calc non Af Amer: >60
Glucose, Bld: 94
Potassium: 3.9
Total Bilirubin: 0.4

## 2011-07-04 LAB — CBC
HCT: 37.7
Hemoglobin: 12.8
MCV: 85.3
RBC: 4.43
WBC: 10

## 2011-07-04 LAB — DIFFERENTIAL
Basophils Absolute: 0.1
Basophils Relative: 1
Lymphocytes Relative: 26
Monocytes Absolute: 0.9 — ABNORMAL HIGH
Neutro Abs: 5.7

## 2011-07-04 LAB — SEDIMENTATION RATE: Sed Rate: 25 — ABNORMAL HIGH

## 2011-07-05 LAB — DIFFERENTIAL
Basophils Absolute: 0.1
Basophils Relative: 1
Lymphocytes Relative: 32
Monocytes Relative: 8
Neutro Abs: 5.6
Neutrophils Relative %: 51

## 2011-07-05 LAB — CBC
Hemoglobin: 12.5
MCHC: 34.3
RBC: 4.39
RDW: 18.4 — ABNORMAL HIGH

## 2011-07-16 ENCOUNTER — Emergency Department (HOSPITAL_COMMUNITY)
Admission: EM | Admit: 2011-07-16 | Discharge: 2011-07-16 | Disposition: A | Discharge status: Home / Self Care | Payer: Self-pay | Attending: Emergency Medicine | Admitting: Emergency Medicine

## 2011-07-16 DIAGNOSIS — Z8639 Personal history of other endocrine, nutritional and metabolic disease: Secondary | ICD-10-CM | POA: Insufficient documentation

## 2011-07-16 DIAGNOSIS — J45909 Unspecified asthma, uncomplicated: Secondary | ICD-10-CM | POA: Insufficient documentation

## 2011-07-16 DIAGNOSIS — Z862 Personal history of diseases of the blood and blood-forming organs and certain disorders involving the immune mechanism: Secondary | ICD-10-CM | POA: Insufficient documentation

## 2011-07-16 DIAGNOSIS — M79609 Pain in unspecified limb: Secondary | ICD-10-CM | POA: Insufficient documentation

## 2012-05-04 ENCOUNTER — Encounter (HOSPITAL_COMMUNITY): Payer: Self-pay | Admitting: *Deleted

## 2012-05-04 ENCOUNTER — Emergency Department (HOSPITAL_COMMUNITY)
Admission: EM | Admit: 2012-05-04 | Discharge: 2012-05-04 | Disposition: A | Discharge status: Home / Self Care | Payer: Self-pay | Attending: Emergency Medicine | Admitting: Emergency Medicine

## 2012-05-04 ENCOUNTER — Emergency Department (HOSPITAL_COMMUNITY): Payer: Self-pay

## 2012-05-04 DIAGNOSIS — R350 Frequency of micturition: Secondary | ICD-10-CM | POA: Insufficient documentation

## 2012-05-04 DIAGNOSIS — R0602 Shortness of breath: Secondary | ICD-10-CM | POA: Insufficient documentation

## 2012-05-04 DIAGNOSIS — R109 Unspecified abdominal pain: Secondary | ICD-10-CM | POA: Insufficient documentation

## 2012-05-04 DIAGNOSIS — R079 Chest pain, unspecified: Secondary | ICD-10-CM | POA: Insufficient documentation

## 2012-05-04 DIAGNOSIS — R42 Dizziness and giddiness: Secondary | ICD-10-CM | POA: Insufficient documentation

## 2012-05-04 DIAGNOSIS — M549 Dorsalgia, unspecified: Secondary | ICD-10-CM | POA: Insufficient documentation

## 2012-05-04 HISTORY — DX: Unspecified asthma, uncomplicated: J45.909

## 2012-05-04 HISTORY — DX: Gastro-esophageal reflux disease without esophagitis: K21.9

## 2012-05-04 LAB — URINALYSIS, ROUTINE W REFLEX MICROSCOPIC
Bilirubin Urine: NEGATIVE
Glucose, UA: NEGATIVE mg/dL
Hgb urine dipstick: NEGATIVE
Ketones, ur: NEGATIVE mg/dL
Nitrite: NEGATIVE
Protein, ur: NEGATIVE mg/dL
Specific Gravity, Urine: 1.023 (ref 1.005–1.030)
Urobilinogen, UA: 1 mg/dL (ref 0.0–1.0)
pH: 6 (ref 5.0–8.0)

## 2012-05-04 LAB — CBC WITH DIFFERENTIAL/PLATELET
Basophils Absolute: 0 10*3/uL (ref 0.0–0.1)
Basophils Relative: 0 % (ref 0–1)
Eosinophils Absolute: 0.4 10*3/uL (ref 0.0–0.7)
Eosinophils Relative: 3 % (ref 0–5)
HCT: 39.1 % (ref 36.0–46.0)
Hemoglobin: 13.3 g/dL (ref 12.0–15.0)
Lymphocytes Relative: 32 % (ref 12–46)
Lymphs Abs: 3.8 10*3/uL (ref 0.7–4.0)
MCH: 29.8 pg (ref 26.0–34.0)
MCHC: 34 g/dL (ref 30.0–36.0)
MCV: 87.5 fL (ref 78.0–100.0)
Monocytes Absolute: 1 K/uL (ref 0.1–1.0)
Monocytes Relative: 8 % (ref 3–12)
Neutro Abs: 6.5 K/uL (ref 1.7–7.7)
Neutrophils Relative %: 56 % (ref 43–77)
Platelets: 336 10*3/uL (ref 150–400)
RBC: 4.47 MIL/uL (ref 3.87–5.11)
RDW: 13.1 % (ref 11.5–15.5)
WBC: 11.7 K/uL — ABNORMAL HIGH (ref 4.0–10.5)

## 2012-05-04 LAB — COMPREHENSIVE METABOLIC PANEL WITH GFR
AST: 23 U/L (ref 0–37)
Alkaline Phosphatase: 122 U/L — ABNORMAL HIGH (ref 39–117)
BUN: 10 mg/dL (ref 6–23)
CO2: 25 meq/L (ref 19–32)
Chloride: 101 meq/L (ref 96–112)
Creatinine, Ser: 0.51 mg/dL (ref 0.50–1.10)
GFR calc non Af Amer: >90 mL/min (ref >90)
Potassium: 3.6 meq/L (ref 3.5–5.1)
Total Bilirubin: 0.2 mg/dL — ABNORMAL LOW (ref 0.3–1.2)

## 2012-05-04 LAB — URINE MICROSCOPIC-ADD ON

## 2012-05-04 LAB — COMPREHENSIVE METABOLIC PANEL
ALT: 22 U/L (ref 0–35)
Albumin: 3.5 g/dL (ref 3.5–5.2)
Calcium: 9.2 mg/dL (ref 8.4–10.5)
GFR calc Af Amer: >90 mL/min (ref >90)
Glucose, Bld: 111 mg/dL — ABNORMAL HIGH (ref 70–99)
Sodium: 137 mEq/L (ref 135–145)
Total Protein: 7.6 g/dL (ref 6.0–8.3)

## 2012-05-04 LAB — LIPASE, BLOOD: Lipase: 29 U/L (ref 11–59)

## 2012-05-04 LAB — PREGNANCY, URINE: Preg Test, Ur: NEGATIVE

## 2012-05-04 MED ORDER — MORPHINE SULFATE 4 MG/ML IJ SOLN
4.0000 mg | Freq: Once | INTRAMUSCULAR | Status: AC
  Administered 2012-05-04: 4 mg via INTRAVENOUS
  Filled 2012-05-04: qty 1

## 2012-05-04 MED ORDER — SODIUM CHLORIDE 0.9 % IV BOLUS (SEPSIS)
1000.0000 mL | Freq: Once | INTRAVENOUS | Status: AC
  Administered 2012-05-04: 1000 mL via INTRAVENOUS

## 2012-05-04 MED ORDER — GI COCKTAIL ~~LOC~~
30.0000 mL | Freq: Once | ORAL | Status: AC
  Administered 2012-05-04: 30 mL via ORAL
  Filled 2012-05-04: qty 30

## 2012-05-04 NOTE — ED Notes (Signed)
Pt reports several day hx of lower back pain, reports urinary frequency, mid abdomimal pain with no nause or vomiting, and left sided chest pain, described as needle sensation (intermittent), pt also reports dizziness.

## 2012-05-04 NOTE — ED Provider Notes (Signed)
History     CSN: [id number]  Arrival date & time 05/04/12  1616   First MD Initiated Contact with Patient 05/04/12 1825      Chief Complaint  Patient presents with  . Back Pain  . Abdominal Pain  . Urinary Frequency    (Consider location/radiation/quality/duration/timing/severity/associated sxs/prior treatment) Patient is a 36 y.o. female presenting with back pain, chest pain, and abdominal pain. The history is provided by the patient. The history is limited by a language barrier. A language interpreter was used.  Back Pain  This is a new problem. Episode onset: 15 days ago. The problem occurs constantly. The problem has not changed since onset.The pain is associated with no known injury. The pain is present in the thoracic spine. The pain is moderate. The symptoms are aggravated by certain positions. The pain is the same all the time. Associated symptoms include chest pain and abdominal pain. Pertinent negatives include no fever, no numbness, no headaches, no abdominal swelling, no bowel incontinence, no perianal numbness, no bladder incontinence, no dysuria, no pelvic pain, no paresthesias, no paresis, no tingling and no weakness. She has tried NSAIDs for the symptoms. The treatment provided moderate relief.  Chest Pain Episode onset: 8 days ago. Chest pain occurs intermittently. The severity of the pain is mild. The quality of the pain is described as sharp. The pain does not radiate. Exacerbated by: pressing on it. Primary symptoms include abdominal pain, nausea and vomiting (vomited once this morning). Pertinent negatives for primary symptoms include no fever, no shortness of breath, no cough and no dizziness.  Pertinent negatives for associated symptoms include no lower extremity edema, no numbness and no weakness. Associated medical issues comments: history of gastritis. She tried NSAIDs for the symptoms.    Abdominal Pain The primary symptoms of the illness include abdominal pain,  nausea and vomiting (vomited once this morning). The primary symptoms of the illness do not include fever, shortness of breath, diarrhea, dysuria, vaginal discharge or vaginal bleeding. Episode onset: 4 days ago. The onset of the illness was sudden. The problem has been gradually worsening.  The abdominal pain is located in the epigastric region. The severity of the abdominal pain is 7/10. The abdominal pain is relieved by nothing. The abdominal pain is exacerbated by eating.  Additional symptoms associated with the illness include back pain. Symptoms associated with the illness do not include chills, constipation, urgency or frequency. Associated medical issues comments: history of gastritis.    Past Medical History  Diagnosis Date  . GERD (gastroesophageal reflux disease)   . Asthma     History reviewed. No pertinent past surgical history.  History reviewed. No pertinent family history.  History  Substance Use Topics  . Smoking status: Never Smoker   . Smokeless tobacco: Not on file  . Alcohol Use: No    OB History    Grav Para Term Preterm Abortions TAB SAB Ect Mult Living                  Review of Systems  Constitutional: Negative for fever and chills.  HENT: Negative for congestion and rhinorrhea.   Eyes: Negative.   Respiratory: Negative for cough and shortness of breath.   Cardiovascular: Positive for chest pain. Negative for leg swelling.  Gastrointestinal: Positive for nausea, vomiting (vomited once this morning) and abdominal pain. Negative for diarrhea, constipation and bowel incontinence.  Genitourinary: Negative for bladder incontinence, dysuria, urgency, frequency, flank pain, decreased urine volume, vaginal bleeding, vaginal discharge, difficulty  urinating, vaginal pain and pelvic pain.  Musculoskeletal: Positive for back pain.  Skin: Negative for wound.  Neurological: Negative for dizziness, tingling, weakness, light-headedness, numbness, headaches and  paresthesias.  Psychiatric/Behavioral: Negative.   All other systems reviewed and are negative.    Allergies  Review of patient's allergies indicates no known allergies.  Home Medications   Current Outpatient Rx  Name Route Sig Dispense Refill  . PANTOPRAZOLE SODIUM 40 MG PO TBEC Oral Take 40 mg by mouth daily.      BP 112/73  Pulse 78  Temp 98 F (36.7 C) (Oral)  Resp 16  SpO2 99%  Physical Exam  Nursing note and vitals reviewed. Constitutional: She is oriented to person, place, and time. She appears well-developed and well-nourished. No distress.  HENT:  Head: Normocephalic and atraumatic.  Right Ear: External ear normal.  Left Ear: External ear normal.  Nose: Nose normal.  Mouth/Throat: Oropharynx is clear and moist.  Eyes: Right eye exhibits no discharge. Left eye exhibits no discharge.  Neck: Neck supple. No thyromegaly present.  Cardiovascular: Normal rate, regular rhythm, normal heart sounds and intact distal pulses.   Pulmonary/Chest: Effort normal and breath sounds normal. No respiratory distress. She has no wheezes. She has no rales.    Abdominal: Soft. She exhibits no distension. There is tenderness in the epigastric area.  Musculoskeletal: She exhibits no edema.       Thoracic back: She exhibits tenderness (lateral mid-thoracic back bilaterally).  Lymphadenopathy:    She has no axillary adenopathy.  Neurological: She is alert and oriented to person, place, and time. She has normal strength. No sensory deficit. She exhibits normal muscle tone.  Skin: Skin is warm and dry. She is not diaphoretic. No pallor.    ED Course  Procedures (including critical care time)  Labs Reviewed  URINALYSIS, ROUTINE W REFLEX MICROSCOPIC - Abnormal; Notable for the following:    Leukocytes, UA SMALL (*)     All other components within normal limits  URINE MICROSCOPIC-ADD ON - Abnormal; Notable for the following:    Squamous Epithelial / LPF FEW (*)     Casts HYALINE  CASTS (*)     All other components within normal limits  CBC WITH DIFFERENTIAL - Abnormal; Notable for the following:    WBC 11.7 (*)     All other components within normal limits  COMPREHENSIVE METABOLIC PANEL - Abnormal; Notable for the following:    Glucose, Bld 111 (*)     Alkaline Phosphatase 122 (*)     Total Bilirubin 0.2 (*)     All other components within normal limits  LIPASE, BLOOD  PREGNANCY, URINE  URINE CULTURE   Dg Chest 2 View  05/04/2012  *RADIOLOGY REPORT*  Clinical Data: Back pain.  Abdominal pain.  Urinary frequency. Left-sided chest pain, shortness of breath, dizziness.  History of asthma.  CHEST - 2 VIEW  Comparison: 12/04/2010  Findings: Cardiomediastinal silhouette is within normal limits. There are mild perihilar bronchitic changes.  There are no focal consolidations or pleural effusions.  Mild lumbar degenerative changes are noted.  IMPRESSION:  1.  Bronchitic changes. 2. No focal pulmonary abnormality.  Original Report Authenticated By: Patterson Hammersmith, M.D.     Date: 05/04/2012  Rate: 66  Rhythm: normal sinus rhythm  QRS Axis: normal  Intervals: normal  ST/T Wave abnormalities: normal  Conduction Disutrbances:none  Narrative Interpretation:   Old EKG Reviewed: unchanged   1. Abdominal pain   2. Back pain  MDM  36 yo hispanic female with multiple complaints over past 2 weeks. 2 weeks of non-midline bilateral thoracic pain without urinary or neurologic symptoms. Also having intermittent chest pain worsened by pushing on her breast with intermittent dyspnea. Worst pain seems to be epigastric abdominal pain similar to prior episode of gastritis per patient. Remains on her protonix currently. No signs of peritonitis. EKG benign, CXR normal. Doubt ACS, PE or dissection. Urine shows small leukocytes but otherwise no urinary symptoms or fever, will send for culture. Labs benign, symptoms improved with GI cocktail and morphine. Likely recurrence of her  GERD/Gastritis. Recommended continuing PPI and adding zantac if she feels necessary. Otherwise back pain and chest pain appear benign. Discussed needing PCP as well as GI follow up.        Pricilla Loveless, MD 05/04/12 2220

## 2012-05-06 LAB — URINE CULTURE: Colony Count: 65000

## 2012-05-07 NOTE — ED Provider Notes (Signed)
I saw and evaluated the patient, reviewed the resident's note and I agree with the findings and plan.  I agree with resident ECG interpretation, I reviewed it myself.  Pt with multiple chronic sounding complaints, no acute distress, vitals are ok.  Pt responded to treatment in the ED.  No acute need for imaging given resolution of back pains and epigastric pain.  Doubt surgical process.  Pt is already on PPI, urged follow up with PCP or GI.      Gavin Pound. Purvi Ruehl, MD 05/07/12 1601

## 2013-01-17 ENCOUNTER — Emergency Department (HOSPITAL_COMMUNITY): Payer: Self-pay

## 2013-01-17 ENCOUNTER — Encounter (HOSPITAL_COMMUNITY): Payer: Self-pay | Admitting: Emergency Medicine

## 2013-01-17 ENCOUNTER — Emergency Department (HOSPITAL_COMMUNITY)
Admission: EM | Admit: 2013-01-17 | Discharge: 2013-01-17 | Disposition: A | Discharge status: Home / Self Care | Payer: Self-pay | Attending: Emergency Medicine | Admitting: Emergency Medicine

## 2013-01-17 DIAGNOSIS — J45901 Unspecified asthma with (acute) exacerbation: Secondary | ICD-10-CM | POA: Insufficient documentation

## 2013-01-17 DIAGNOSIS — N898 Other specified noninflammatory disorders of vagina: Secondary | ICD-10-CM | POA: Insufficient documentation

## 2013-01-17 DIAGNOSIS — R059 Cough, unspecified: Secondary | ICD-10-CM | POA: Insufficient documentation

## 2013-01-17 DIAGNOSIS — Z79899 Other long term (current) drug therapy: Secondary | ICD-10-CM | POA: Insufficient documentation

## 2013-01-17 DIAGNOSIS — R05 Cough: Secondary | ICD-10-CM | POA: Insufficient documentation

## 2013-01-17 DIAGNOSIS — K219 Gastro-esophageal reflux disease without esophagitis: Secondary | ICD-10-CM | POA: Insufficient documentation

## 2013-01-17 DIAGNOSIS — N939 Abnormal uterine and vaginal bleeding, unspecified: Secondary | ICD-10-CM

## 2013-01-17 DIAGNOSIS — J40 Bronchitis, not specified as acute or chronic: Secondary | ICD-10-CM

## 2013-01-17 DIAGNOSIS — J45909 Unspecified asthma, uncomplicated: Secondary | ICD-10-CM

## 2013-01-17 LAB — BASIC METABOLIC PANEL
BUN: 8 mg/dL (ref 6–23)
Chloride: 100 mEq/L (ref 96–112)
GFR calc Af Amer: >90 mL/min (ref >90)
Potassium: 3.6 mEq/L (ref 3.5–5.1)
Sodium: 140 mEq/L (ref 135–145)

## 2013-01-17 LAB — CBC WITH DIFFERENTIAL/PLATELET
Basophils Absolute: 0.1 10*3/uL (ref 0.0–0.1)
Basophils Relative: 1 % (ref 0–1)
HCT: 38.1 % (ref 36.0–46.0)
MCH: 30 pg (ref 26.0–34.0)
MCV: 83.4 fL (ref 78.0–100.0)
Monocytes Relative: 8 % (ref 3–12)
Neutro Abs: 6.9 10*3/uL (ref 1.7–7.7)
Platelets: 399 10*3/uL (ref 150–400)
RBC: 4.57 MIL/uL (ref 3.87–5.11)
RDW: 13.3 % (ref 11.5–15.5)
WBC: 12.8 10*3/uL — ABNORMAL HIGH (ref 4.0–10.5)

## 2013-01-17 LAB — POCT PREGNANCY, URINE: Preg Test, Ur: NEGATIVE

## 2013-01-17 MED ORDER — ALBUTEROL SULFATE HFA 108 (90 BASE) MCG/ACT IN AERS: 2.0000 | INHALATION_SPRAY | RESPIRATORY_TRACT | Status: DC | PRN

## 2013-01-17 MED ORDER — PREDNISONE 20 MG PO TABS: 60.0000 mg | ORAL_TABLET | Freq: Every day | ORAL | Status: DC

## 2013-01-17 MED ORDER — ALBUTEROL SULFATE (5 MG/ML) 0.5% IN NEBU
INHALATION_SOLUTION | RESPIRATORY_TRACT | Status: AC
  Administered 2013-01-17: 2.5 mg via RESPIRATORY_TRACT
  Filled 2013-01-17: qty 0.5

## 2013-01-17 MED ORDER — ALBUTEROL SULFATE HFA 108 (90 BASE) MCG/ACT IN AERS
2.0000 | INHALATION_SPRAY | RESPIRATORY_TRACT | Status: DC | PRN
  Administered 2013-01-17: 2 via RESPIRATORY_TRACT
  Filled 2013-01-17: qty 6.7

## 2013-01-17 MED ORDER — ALBUTEROL SULFATE (5 MG/ML) 0.5% IN NEBU
2.5000 mg | INHALATION_SOLUTION | Freq: Once | RESPIRATORY_TRACT | Status: AC
  Administered 2013-01-17: 2.5 mg via RESPIRATORY_TRACT

## 2013-01-17 MED ORDER — IPRATROPIUM BROMIDE 0.02 % IN SOLN
0.5000 mg | Freq: Once | RESPIRATORY_TRACT | Status: AC
  Administered 2013-01-17: 0.5 mg via RESPIRATORY_TRACT
  Filled 2013-01-17: qty 2.5

## 2013-01-17 MED ORDER — IPRATROPIUM BROMIDE 0.02 % IN SOLN
0.5000 mg | Freq: Once | RESPIRATORY_TRACT | Status: AC
  Administered 2013-01-17: 0.5 mg via RESPIRATORY_TRACT
  Filled 2013-01-17 (×2): qty 2.5

## 2013-01-17 MED ORDER — AZITHROMYCIN 250 MG PO TABS: ORAL_TABLET | ORAL | Status: DC

## 2013-01-17 MED ORDER — ALBUTEROL SULFATE (5 MG/ML) 0.5% IN NEBU
5.0000 mg | INHALATION_SOLUTION | Freq: Once | RESPIRATORY_TRACT | Status: AC
  Administered 2013-01-17: 5 mg via RESPIRATORY_TRACT
  Filled 2013-01-17 (×2): qty 1

## 2013-01-17 MED ORDER — METHYLPREDNISOLONE SODIUM SUCC 125 MG IJ SOLR
125.0000 mg | Freq: Once | INTRAMUSCULAR | Status: AC
  Administered 2013-01-17: 125 mg via INTRAMUSCULAR
  Filled 2013-01-17: qty 2

## 2013-01-17 NOTE — ED Provider Notes (Signed)
History     CSN: [id number]  Arrival date & time 01/17/13  1430   First MD Initiated Contact with Patient 01/17/13 1758      No chief complaint on file.   (Consider location/radiation/quality/duration/timing/severity/associated sxs/prior treatment) HPI Comments: Comes to the emergency department for evaluation of difficulty breathing. Patient has a history of asthma, reports that her breathing has been progressively worsening for about 3 weeks. She is out of her albuterol. She has been using Primatene tabs without improvement. Patient has had a mild headache. She has not had any fever. Patient does report some abdominal cramping as well, has attributed that to her menstrual period. No nausea or vomiting. No diarrhea.   Past Medical History  Diagnosis Date  . GERD (gastroesophageal reflux disease)   . Asthma     History reviewed. No pertinent past surgical history.  No family history on file.  History  Substance Use Topics  . Smoking status: Never Smoker   . Smokeless tobacco: Not on file  . Alcohol Use: No    OB History   Grav Para Term Preterm Abortions TAB SAB Ect Mult Living                  Review of Systems  Constitutional: Negative for fever.  Respiratory: Positive for cough, shortness of breath and wheezing.   Genitourinary: Positive for vaginal bleeding.  All other systems reviewed and are negative.    Allergies  Review of patient's allergies indicates no known allergies.  Home Medications   Current Outpatient Rx  Name  Route  Sig  Dispense  Refill  . albuterol (PROVENTIL HFA;VENTOLIN HFA) 108 (90 BASE) MCG/ACT inhaler   Inhalation   Inhale 2 puffs into the lungs every 6 (six) hours as needed for wheezing or shortness of breath.         Marland Kitchen Ephedrine-Guaifenesin (PRIMATENE ASTHMA PO)   Oral   Take 1 tablet by mouth every 4 (four) hours as needed (asthma).         . loratadine (CLARITIN) 10 MG tablet   Oral   Take 10 mg by mouth daily.         . pantoprazole (PROTONIX) 40 MG tablet   Oral   Take 40 mg by mouth daily.           BP 104/91  Pulse 102  Temp(Src) 98.8 F (37.1 C) (Oral)  Resp 24  SpO2 97%  LMP 01/10/2013  Physical Exam  Constitutional: She is oriented to person, place, and time. She appears well-developed and well-nourished. No distress.  HENT:  Head: Normocephalic and atraumatic.  Right Ear: Hearing normal.  Nose: Nose normal.  Mouth/Throat: Oropharynx is clear and moist and mucous membranes are normal.  Eyes: Conjunctivae and EOM are normal. Pupils are equal, round, and reactive to light.  Neck: Normal range of motion. Neck supple.  Cardiovascular: Normal rate, regular rhythm, S1 normal and S2 normal.  Exam reveals no gallop and no friction rub.   No murmur heard. Pulmonary/Chest: Effort normal. No respiratory distress. She has no decreased breath sounds. She has wheezes in the right middle field, the right lower field, the left middle field and the left lower field. She has no rhonchi. She has no rales. She exhibits no tenderness.  Abdominal: Soft. Normal appearance and bowel sounds are normal. There is no hepatosplenomegaly. There is no tenderness. There is no rebound, no guarding, no tenderness at McBurney's point and negative Murphy's sign. No hernia.  Musculoskeletal: Normal  range of motion.  Neurological: She is alert and oriented to person, place, and time. She has normal strength. No cranial nerve deficit or sensory deficit. Coordination normal. GCS eye subscore is 4. GCS verbal subscore is 5. GCS motor subscore is 6.  Skin: Skin is warm, dry and intact. No rash noted. No cyanosis.  Psychiatric: She has a normal mood and affect. Her speech is normal and behavior is normal. Thought content normal.    ED Course  Procedures (including critical care time)  Labs Reviewed - No data to display Dg Abd Acute W/chest  01/17/2013  *RADIOLOGY REPORT*  Clinical Data: Asthma, abdominal pain  ACUTE ABDOMEN  SERIES (ABDOMEN 2 VIEW & CHEST 1 VIEW)  Comparison: 05/04/2012  Findings: Cardiomediastinal silhouette is stable.  No acute infiltrate or pleural effusion.  No pulmonary edema.  Bony thorax is stable.  There is nonspecific nonobstructive bowel gas pattern.  No free abdominal air.  Hepatomegaly is suspected.  IMPRESSION: No acute disease.  Nonspecific nonobstructive bowel gas pattern. No free abdominal air.  Hepatomegaly is suspected.   Original Report Authenticated By: Natasha Mead, M.D.      Diagnosis: 1. Asthma exacerbation 2. Bronchitis 3. Abnormal Uterine Bleeding     MDM  Comes to the emergency department for evaluation of cough and difficulty breathing. Patient has run out of her albuterol, has been using her son's. She has not had relief. Patient has not had any fever. Cough is nonproductive. She did get improvement with nebulizers here in the ER. Oxygen saturation is normal. She has minimal bronchospasm present. Will be discharged and continue outpatient treatment with bronchodilators, Zithromax and prednisone.  At time of discharge, patient mentioned that she has been having irregular menstrual bleeding. She did not have any abdominal pain, pelvic pain. Pregnancy was negative. Will refer her to gynecology for further evaluation.        Gilda Crease, MD 01/22/13 [phone]

## 2013-01-17 NOTE — ED Notes (Signed)
astma has beenbad x 3 weeks meds not working

## 2013-01-17 NOTE — ED Notes (Signed)
MD at bedside. 

## 2013-03-12 ENCOUNTER — Emergency Department (HOSPITAL_COMMUNITY)
Admission: EM | Admit: 2013-03-12 | Discharge: 2013-03-12 | Disposition: A | Discharge status: Home / Self Care | Payer: Self-pay | Attending: Emergency Medicine | Admitting: Emergency Medicine

## 2013-03-12 ENCOUNTER — Emergency Department (HOSPITAL_COMMUNITY): Payer: Self-pay

## 2013-03-12 ENCOUNTER — Encounter (HOSPITAL_COMMUNITY): Payer: Self-pay | Admitting: *Deleted

## 2013-03-12 DIAGNOSIS — K219 Gastro-esophageal reflux disease without esophagitis: Secondary | ICD-10-CM | POA: Insufficient documentation

## 2013-03-12 DIAGNOSIS — Y929 Unspecified place or not applicable: Secondary | ICD-10-CM | POA: Insufficient documentation

## 2013-03-12 DIAGNOSIS — Y93G1 Activity, food preparation and clean up: Secondary | ICD-10-CM | POA: Insufficient documentation

## 2013-03-12 DIAGNOSIS — S61209A Unspecified open wound of unspecified finger without damage to nail, initial encounter: Secondary | ICD-10-CM | POA: Insufficient documentation

## 2013-03-12 DIAGNOSIS — J45909 Unspecified asthma, uncomplicated: Secondary | ICD-10-CM | POA: Insufficient documentation

## 2013-03-12 DIAGNOSIS — Z79899 Other long term (current) drug therapy: Secondary | ICD-10-CM | POA: Insufficient documentation

## 2013-03-12 DIAGNOSIS — S61219A Laceration without foreign body of unspecified finger without damage to nail, initial encounter: Secondary | ICD-10-CM

## 2013-03-12 DIAGNOSIS — Z23 Encounter for immunization: Secondary | ICD-10-CM | POA: Insufficient documentation

## 2013-03-12 DIAGNOSIS — W292XXA Contact with other powered household machinery, initial encounter: Secondary | ICD-10-CM | POA: Insufficient documentation

## 2013-03-12 MED ORDER — TETANUS-DIPHTH-ACELL PERTUSSIS 5-2.5-18.5 LF-MCG/0.5 IM SUSP
0.5000 mL | Freq: Once | INTRAMUSCULAR | Status: AC
  Administered 2013-03-12: 0.5 mL via INTRAMUSCULAR
  Filled 2013-03-12: qty 0.5

## 2013-03-12 MED ORDER — OXYCODONE-ACETAMINOPHEN 5-325 MG PO TABS
1.0000 | ORAL_TABLET | Freq: Once | ORAL | Status: AC
  Administered 2013-03-12: 1 via ORAL
  Filled 2013-03-12: qty 1

## 2013-03-12 NOTE — ED Notes (Signed)
Suture cart set-up done.

## 2013-03-12 NOTE — ED Notes (Signed)
PT cut left hand with knife while washing dishes today to left middle tip of finger

## 2013-03-12 NOTE — ED Provider Notes (Signed)
History    This chart was scribed for a non-physician practitioner, Dierdre Forth, PA-C, working with Carleene Cooper III, MD by Frederik Pear, ED Scribe. This patient was seen in room TR09C/TR09C and the patient's care was started at 2029.  CSN: [id number] Arrival date & time 03/12/13  1752  First MD Initiated Contact with Patient 03/12/13 2029     Chief Complaint  Patient presents with  . Laceration   (Consider location/radiation/quality/duration/timing/severity/associated sxs/prior Treatment) Patient is a 37 y.o. female presenting with skin laceration. The history is provided by the patient and medical records. No language interpreter was used.  Laceration Location:  Hand Hand laceration location:  L finger Length (cm):  3 Bleeding: uncontrolled   Time since incident:  4 hours Laceration mechanism:  Knife Tetanus status:  Out of date   HPI Comments: Alexandra Aguirre is a 37 y.o. female who presents to the Emergency Department complaining of a laceration to the distal tip of the left middle finger that began at 1700 when she was washing dishes and sliced her hand on a knife that she was washing. In ED, the bleeding is controlled. She denies any associated symptoms. She reports that she was unable to get the bleeding under control so she placed her hands in coffee grounds until it subdued and then cleaned the wound with alcohol. She has a h/o of allergies for which has an albuterol inhaler that she takes as needed.   No PCP.   Past Medical History  Diagnosis Date  . GERD (gastroesophageal reflux disease)   . Asthma    History reviewed. No pertinent past surgical history. No family history on file. History  Substance Use Topics  . Smoking status: Never Smoker   . Smokeless tobacco: Not on file  . Alcohol Use: No   OB History   Grav Para Term Preterm Abortions TAB SAB Ect Mult Living                 Review of Systems  Constitutional: Negative for fever, diaphoresis,  appetite change, fatigue and unexpected weight change.  HENT: Negative for mouth sores and neck stiffness.   Eyes: Negative for visual disturbance.  Respiratory: Negative for cough, chest tightness, shortness of breath and wheezing.   Cardiovascular: Negative for chest pain.  Gastrointestinal: Negative for nausea, vomiting, abdominal pain, diarrhea and constipation.  Endocrine: Negative for polydipsia, polyphagia and polyuria.  Genitourinary: Negative for dysuria, urgency, frequency and hematuria.  Musculoskeletal: Negative for back pain.  Skin: Positive for wound (laceration). Negative for rash.  Allergic/Immunologic: Negative for immunocompromised state.  Neurological: Negative for syncope, light-headedness and headaches.  Hematological: Does not bruise/bleed easily.  Psychiatric/Behavioral: Negative for sleep disturbance. The patient is not nervous/anxious.     Allergies  Review of patient's allergies indicates no known allergies.  Home Medications   Current Outpatient Rx  Name  Route  Sig  Dispense  Refill  . albuterol (PROVENTIL HFA;VENTOLIN HFA) 108 (90 BASE) MCG/ACT inhaler   Inhalation   Inhale 2 puffs into the lungs every 6 (six) hours as needed for wheezing or shortness of breath.         . pantoprazole (PROTONIX) 40 MG tablet   Oral   Take 40 mg by mouth daily.         Marland Kitchen PRESCRIPTION MEDICATION   Oral   Take 1 tablet by mouth at bedtime. Birth control pill from clinic at [facility].          BP  122/65  Pulse 82  Temp(Src) 97.9 F (36.6 C) (Oral)  Resp 18  SpO2 99%  LMP 03/12/2013 Physical Exam  Nursing note and vitals reviewed. Constitutional: She appears well-developed and well-nourished. No distress.  HENT:  Head: Normocephalic and atraumatic.  Mouth/Throat: Oropharynx is clear and moist. No oropharyngeal exudate.  Eyes: Conjunctivae are normal. No scleral icterus.  Neck: Normal range of motion. Neck supple.  Cardiovascular: Normal rate,  regular rhythm and intact distal pulses.   Pulmonary/Chest: Effort normal and breath sounds normal. No respiratory distress. She has no wheezes.  Abdominal: Soft. Bowel sounds are normal. She exhibits no mass. There is no tenderness. There is no rebound and no guarding.  Musculoskeletal: Normal range of motion. She exhibits no edema.  Neurological: She is alert.  Speech is clear and goal oriented Moves extremities without ataxia  Skin: Skin is warm and dry. Laceration noted. She is not diaphoretic.  3 cm hemostatic, straight laceration to the distal tip of the left middle finger that is not through and through.   Psychiatric: She has a normal mood and affect.    ED Course  Procedures (including critical care time)  DIAGNOSTIC STUDIES: Oxygen Saturation is 99% on room air, normal by my interpretation.    COORDINATION OF CARE:  20:40- Discussed planned course of treatment with the patient, including repairing the laceration and a tetanus shot, who is agreeable at this time.  20:45: - LACERATION REPAIR Performed by: Therese Sarah PA-C Consent: Verbal consent obtained. Risks and benefits: risks, benefits and alternatives were discussed Patient identity confirmed: provided demographic data Time out performed prior to procedure Prepped and Draped in normal sterile fashion Wound explored Laceration Location: distal tip of the left middle finger Laceration Length: 3 cm No Foreign Bodies seen or palpated Anesthesia: local infiltration Local anesthetic: lidocaine 2% without epinephrine Anesthetic total: 4 cc Irrigation method: syringe Amount of cleaning: standard Skin closure: sutures with 5/0 prolene Number of sutures or staples: 5 Technique: simple interrupted Patient tolerance: Patient tolerated the procedure well with no immediate complications.  Labs Reviewed - No data to display Dg Hand Complete Left  03/12/2013   *RADIOLOGY REPORT*  Clinical Data: Laceration to the  long finger.  LEFT HAND - COMPLETE 3+ VIEW  Comparison: None.  Findings: There is no fracture, dislocation, arthritis, or other osseous abnormality.  Laceration to the tip of the long finger is noted.  No radiodense foreign body in the soft tissues.  IMPRESSION: Soft tissue laceration of the long finger.  Otherwise, normal exam.   Original Report Authenticated By: Francene Boyers, M.D.   1. Laceration of finger, left, initial encounter     MDM  Alexandra Aguirre presents with laceration.  Tdap booster given.Pressure irrigation performed. Laceration occurred < 8 hours prior to repair which was well tolerated. Pt has no co morbidities to effect normal wound healing. Discussed suture home care w pt and answered questions. Pt to f-u for wound check and suture removal in 7 days. Pt is hemodynamically stable w no complaints prior to dc.  I have also discussed reasons to return immediately to the ER.  Patient expresses understanding and agrees with plan.  I personally performed the services described in this documentation, which was scribed in my presence. The recorded information has been reviewed and is accurate.    Dahlia Client Mackenzye Mackel, PA-C 03/12/13 2102

## 2013-03-12 NOTE — ED Notes (Signed)
PT ambulated with baseline gait; VSS; A&Ox3; no signs of distress; respirations even and unlabored; skin warm and dry; no questions upon discharge.  

## 2013-03-12 NOTE — ED Notes (Signed)
Pt taken to xray from Triage.

## 2013-03-13 NOTE — ED Provider Notes (Signed)
Medical screening examination/treatment/procedure(s) were performed by non-physician practitioner and as supervising physician I was immediately available for consultation/collaboration.   Carleene Cooper III, MD 03/13/13 [phone]

## 2013-03-18 ENCOUNTER — Encounter (HOSPITAL_COMMUNITY): Payer: Self-pay | Admitting: Emergency Medicine

## 2013-03-18 ENCOUNTER — Emergency Department (HOSPITAL_COMMUNITY)
Admission: EM | Admit: 2013-03-18 | Discharge: 2013-03-18 | Disposition: A | Discharge status: Home / Self Care | Payer: Self-pay | Attending: Emergency Medicine | Admitting: Emergency Medicine

## 2013-03-18 DIAGNOSIS — Z8719 Personal history of other diseases of the digestive system: Secondary | ICD-10-CM | POA: Insufficient documentation

## 2013-03-18 DIAGNOSIS — Z4802 Encounter for removal of sutures: Secondary | ICD-10-CM | POA: Insufficient documentation

## 2013-03-18 DIAGNOSIS — Z79899 Other long term (current) drug therapy: Secondary | ICD-10-CM | POA: Insufficient documentation

## 2013-03-18 DIAGNOSIS — J45909 Unspecified asthma, uncomplicated: Secondary | ICD-10-CM | POA: Insufficient documentation

## 2013-03-18 NOTE — ED Notes (Signed)
Pt had sutures to finger on 6/24 after cutting it with a knife. States finger throbs. Edges of wound do not appear completely healed.

## 2013-03-18 NOTE — ED Provider Notes (Signed)
History    CSN: [id number] Arrival date & time 03/18/13  1103  First MD Initiated Contact with Patient 03/18/13 1317     Chief Complaint  Patient presents with  . Suture / Staple Removal   (Consider location/radiation/quality/duration/timing/severity/associated sxs/prior Treatment) The history is provided by the patient and a relative. No language interpreter was used.  Alexandra Aguirre is a 37 y/o F with PMHx of GERD and Asthma presenting to the ED, with children - son as interpreter- for suture removal - patient seen in ED on 03/12/2013 for laceration to the distal finger pad of the left long finger via knife accident - laceration was closed with 5 sutures. Patient reported mild throbbing sensation. Stated that she has been cleaning it every day. Denied numbness, tingling, drainage, swelling.  Tetanus booster given at time of suture application.   Past Medical History  Diagnosis Date  . GERD (gastroesophageal reflux disease)   . Asthma    History reviewed. No pertinent past surgical history. History reviewed. No pertinent family history. History  Substance Use Topics  . Smoking status: Never Smoker   . Smokeless tobacco: Not on file  . Alcohol Use: No   OB History   Grav Para Term Preterm Abortions TAB SAB Ect Mult Living                 Review of Systems  Constitutional: Negative for fever and chills.  Skin: Positive for wound.  Neurological: Negative for weakness and numbness.  All other systems reviewed and are negative.    Allergies  Review of patient's allergies indicates no known allergies.  Home Medications   Current Outpatient Rx  Name  Route  Sig  Dispense  Refill  . Acetaminophen (TYLENOL PO)   Oral   Take 1-2 tablets by mouth every 6 (six) hours as needed (pain).         Marland Kitchen albuterol (PROVENTIL HFA;VENTOLIN HFA) 108 (90 BASE) MCG/ACT inhaler   Inhalation   Inhale 2 puffs into the lungs every 6 (six) hours as needed for wheezing or shortness of  breath.         . Ibuprofen (ADVIL PO)   Oral   Take 2 tablets by mouth every 8 (eight) hours as needed (pain).          BP 115/64  Pulse 85  Temp(Src) 98.4 F (36.9 C) (Oral)  Resp 18  SpO2 97%  LMP 03/12/2013 Physical Exam  Nursing note and vitals reviewed. Constitutional: She is oriented to person, place, and time. She appears well-developed and well-nourished. No distress.  HENT:  Head: Normocephalic and atraumatic.  Neck: Normal range of motion. Neck supple.  Cardiovascular: Normal rate, regular rhythm and normal heart sounds.  Exam reveals no friction rub.   No murmur heard. Pulses:      Radial pulses are 2+ on the right side, and 2+ on the left side.  Pulmonary/Chest: Effort normal and breath sounds normal. No respiratory distress. She has no wheezes. She has no rales.  Musculoskeletal:  Full ROM to the left long finger without discomfort Strength 5+/5+ with resistance Mild discomfort upon palpation to the tip of the distal pad of left long finger  Neurological: She is alert and oriented to person, place, and time. She exhibits normal muscle tone. Coordination normal.  Sensation intact   Skin: Skin is warm and dry. No rash noted. She is not diaphoretic. No erythema.  Approximately, 3 cm laceration healed over - 5 sutures placed to distal  portion of finger pad of left long finger. Proper closure - scabbed. Negative erythema, swelling, drainage noted. Negative signs of infection.   Psychiatric: She has a normal mood and affect. Her behavior is normal. Thought content normal.    ED Course  Procedures (including critical care time) Labs Reviewed - No data to display No results found.  1. Encounter for removal of sutures     MDM  Patient presenting to ED for wound check and suture removal from laceration that occurred on 03/12/2013. Properly healed over laceration to distal finger pad of left long finger. Mild tenderness upon palpation to the left long finger.  Negative swelling, erythema, drainage, dehiscence. Strength 5+/5+ - full ROM to finger without discomfort. Sutures removed - patient tolerated well. Wound cleaned. Steri-strip placed to site. Discussed wound care. Patient stable, afebrile. Discharged patient. Discussed with patient to continue to monitor symptoms and if symptoms are to worsen or change to report back to the ED - strict return instructions given.  Patient agreed to plan of care, understood, all questions answered.    Raymon Mutton, PA-C 03/18/13 1804

## 2013-03-18 NOTE — ED Notes (Signed)
Pt here for suture removal to left finger 

## 2013-03-19 NOTE — ED Provider Notes (Signed)
Medical screening examination/treatment/procedure(s) were performed by non-physician practitioner and as supervising physician I was immediately available for consultation/collaboration.  Derwood Kaplan, MD 03/19/13 1535

## 2013-08-27 ENCOUNTER — Ambulatory Visit: Payer: Self-pay

## 2013-08-29 ENCOUNTER — Ambulatory Visit: Payer: No Typology Code available for payment source | Attending: Internal Medicine

## 2013-08-29 VITALS — BP 132/76 | HR 75 | Temp 98.4°F | Resp 16 | Ht 61.0 in | Wt 191.0 lb

## 2013-08-29 DIAGNOSIS — Z23 Encounter for immunization: Secondary | ICD-10-CM

## 2013-08-29 DIAGNOSIS — K2970 Gastritis, unspecified, without bleeding: Secondary | ICD-10-CM

## 2013-08-29 DIAGNOSIS — G43909 Migraine, unspecified, not intractable, without status migrainosus: Secondary | ICD-10-CM

## 2013-08-29 MED ORDER — BUTALBITAL-APAP-CAFFEINE 50-325-40 MG PO TABS: ORAL_TABLET | ORAL | Status: DC

## 2013-08-29 MED ORDER — OMEPRAZOLE 20 MG PO CPDR: 20.0000 mg | DELAYED_RELEASE_CAPSULE | Freq: Every day | ORAL | Status: DC

## 2013-08-29 MED ORDER — TOPIRAMATE 50 MG PO TABS: ORAL_TABLET | ORAL | Status: DC

## 2013-08-29 NOTE — Progress Notes (Unsigned)
Pt here to establish care for hx asthma,migraine headaches starting from bilat temporal to posterior x 2 weeks Swelling to lower extrem with sob Pacific interpretor used for spanish language Pt requesting flu vaccine Need medication for gastritis

## 2013-08-29 NOTE — Patient Instructions (Signed)
Dolor de cabeza, preguntas frecuentes y sus respuestas  (Headaches, Frequently Asked Questions)  CEFALEAS MIGRAÑOSAS  P: ¿Qué es la migraña? ¿Qué la ocasiona? ¿Cómo puedo tratarla?  R: En general, la migraña comienza como un dolor apagado. Luego progresa hacia un dolor, constante, punzante y como un latido. Sentirá este dolor en las sienes. Podrá sentir dolor en la parte anterior o posterior de la cabeza, o en uno o ambos lados. El dolor suele estar acompañado de una combinación de:  · Náuseas.  · Vómitos.  · Sensibilidad a la luz y los ruidos.  Algunas personas (un 15%) experimentan un aura (ver abajo) antes de un ataque. La causa de la migraña se debe a reacciones químicas del cerebro. El tratamiento para la migraña puede incluir medicamentos de venta libre. También puede incluir técnicas de autoayuda. Estas incluyen entrenamientos para la relajación y biorretroalimentación.   P: ¿Qué es un aura?  R: Alrededor del 15% de las personas con migraña tiene un "aura". Es una señal de síntomas neurológicos que ocurren antes de un dolor de cabeza por migrañas. Podrá ver líneas onduladas o irregulares, puntos o luces parpadeantes. Podrá experimentar visión de túnel o puntos ciegos en uno o ambos ojos. El aura puede incluir alucinaciones visuales o auditivas (algo que se imagina). Puede incluir trastornos en el olfato (como olores extraños), el tacto o el gusto. Entre otros síntomas se incluyen:  · Adormecimiento.  · Sensación de hormigueo.  · Dificultad para recordar o decir la palabra correcta.  Estos episodios neurológicos pueden durar hasta 60 minutos. Los síntomas desaparecerán a medida que el dolor de cabeza comience.  P:¿Qué es un disparador?  R: Ciertos factores físicos o ambientales pueden llevar a "disparar" una migraña. Estos son:  · Alimentos.  · Cambios hormonales.  · Clima.  · Estrés.  Es importante recordar que los disparadores son diferentes entre si. Para ayudar a prevenir ataques de migrañas, necesitará  descubrir cuáles son los disparadores que le afectan. Lleve un diario sobre sus dolores de cabeza. Este es un buen modo para descubrir los disparadores. El diario le ayudará en el momento de hablar con el profesional acerca de su enfermedad.  P: ¿El clima afecta en las migrañas?  R: La luz solar, el calor, la humedad y lo cambios drásticos en la presión barométrica pueden llevar a, o "disparar" un ataque de migraña en algunas personas. Pero estudios han demostrado que el clima no actúa como disparador para todas las personas con migraña.  P: ¿Cuál es la relación entre la migraña y la hormonas?  R: Las hormonas inician y regulan muchas de las funciones corporales. Las hormonas mantienen el balance en el cuerpo dentro de los constantes cambios de ambiente. Algunas veces, el nivel de hormonas en el cuerpo se desbalancea. Por ejemplo, durante la menstruación, el embarazo o la menopausia. Pueden ser la causa de un ataque de migraña. De hecho, alrededor de tres cuartos de las mujeres con migraña informan que sus ataques están relacionados con el ciclo menstrual.   P: ¿Aumenta el riesgo de sufrir un choque cardíaco en las personas que padecen migraña?  R: La probabilidad de que un ataque de migraña ocasione un ataque cardíaco es muy remota. Esto no quiere decir que una persona que sufre de migraña no pueda tener un ataque cardíaco asociado con ella. En las personas menores de 40 años, el factor más común para un ataque es la migraña. Pero durante la vida de una persona, la ocurrencia de un dolor de cabeza   por migraña está asociada con una reducción en el riesgo de morir por un ataque cerebrovascular.   P: ¿Cuáles son los medicamentos para la migraña?  R: La medicación precisa se utiliza para tratar el dolor de cabeza una vez que ha comenzado. Son ejemplos, medicamentos de venta libre, desinflamatorios sin esteroides, ergotamínicos y triptanos.   P: ¿Qué son los triptanos?  R: Lo triptanos son una nueva clase de  medicamentos abortivos. Son específicos para tratar este problema. Los triptanos son vasoconstrictores. Moderan algunas reacciones químicas del cerebro. Los triptanos trabajan como receptores del cerebro. Ayudan a restaurar el balance de un neurotransmisor denominado serotonina. Se cree que las fluctuaciones en los niveles de serotonina son la causa principal de la migraña.   P: ¿Son efectivos los medicamentos de venta libre para la migraña?  R: Los medicamentos de venta libre pueden ser efectivos para aliviar dolores leves a moderados y los síntomas asociados a la migraña. Pero deberá consultar a un médico antes de comenzar cualquier tratamiento para la migraña.   P: ¿Cuáles son los medicamentos de prevención de la migraña?  R: Se suele denominar tratamiento "profiláctico" a los medicamentos para la prevención de la migraña. Se utilizan para reducir la frecuencia, gravedad y duración de los ataques de migraña. Son ejemplos de medicamentos de prevención: antiepilépticos, antidepresivos, bloqueadores beta, bloqueadores de los canales de calcio y medicamentos antiinflamatorios sin esteroides.  P: ¿ Por qué se utilizan anticonvulsivantes para tratar la migraña?  R: Durante los últimos años, ha habido un creciente interés en las drogas antiepilépticas para la prevención de la migraña. A menudo se los conoce como "anticonvulsivantes". La epilepsia y la migraña suceden por reacciones similares en el cerebro.   P: ¿ Por qué se utilizan antidepresivos para tratar la migraña?  R: Los antidepresivos típicamente se utilizan para tratar a las personas con depresión. Pueden reducir la frecuencia de la migraña a través de la regulación de los niveles químicos, como la serotonina, en el cerebro.   P: ¿ Por qué se utilizan terapias alternativas para tratar la migraña?  R: El término "terapias alternativas" suelen utilizarse para describir los tratamientos que se considera que están por fuera de alcance la medicina occidental  convencional. Son ejemplos de las terapias alternativas: la acupuntura, la acupresión y el yoga. Otra terapia alternativa común es la terapia herbal. Se cree que algunas hierbas ayudan a aliviar los dolores de cabeza. Siempre consulte con el profesional acerca de las terapias alternativas antes de utilizarlas. Algunos productos herbales contienen arsénico y otras toxinas.  DOLORES DE CABEZA POR TENSIÓN  P: ¿Qué es un dolor de cabeza por tensión? ¿Qué lo ocasiona? ¿Cómo puedo tratarlo?  R: Los dolores de cabeza por tensión ocurren al azar. A menudo son el resultado de estrés temporario, ansiedad, fatiga o ira. Los síntomas incluyen dolor en las sienes, una sensación como de tener una banda alrededor de la cabeza (un dolor que "presiona"). Los síntomas pueden incluir una sensación de empuje, de presión y contracción de los músculos de la cabeza y el cuello. El dolor comienza en la frente, sienes o en la parte posterior de la cabeza y el cuello. El tratamiento para los dolores de cabeza por tensión puede incluir medicamentos de venta libre. También puede incluir técnicas de autoayuda con entrenamientos para la relajación y biorretroalimentación.  CEFALEA EN RACIMOS  P: ¿Qué es una cefalea en racimos? ¿Qué la ocasiona? ¿Cómo puedo tratarla?  R: La cefalea en racimos toma su nombre debido a que los ataques vienen   en grupos. El dolor aparece con poco o ningún aviso. Normalmente ocurre de un lado de la cabeza. Muchas veces el dolor viene acompañado de un lagrimeo u ojo rojo y goteo de la nariz del mismo lado que el dolor. Se cree que la causa es una reacción en las sustancias químicas del cerebro. Se describe como el caso más grave e intenso de cualquier tipo de dolor de cabeza. El tratamiento incluye medicamentos bajo receta y oxígeno.  CEFALEA SINUSAL  P: ¿Qué es una cefalea sinusal? ¿Qué la ocasiona? ¿Cómo puedo tratarla?  R: Cuando se inflama una cavidad en los huesos de la cara y el cráneo (sinus) ocasiona un dolor  localizado. Esta enfermedad generalmente es el resultado de una reacción alérgica, un tumor o una infección. Si el dolor de cabeza está ocasionado por un bloqueo del sinus, como una infección, probablemente tendrá fiebre. Una imagen de rayos X confirmará el bloqueo del sinus. El tratamiento indicado por el médico podrá incluir antibióticos para la infección, y también antihistamínicos o descongestivos.   DOLOR DE CABEZA POR EFECTO "REBOTE"  P: ¿Qué es un dolor de cabeza por efecto "rebote"? ¿Qué lo ocasiona? ¿Cómo puedo tratarlo?  R: Si se toman medicamentos para el dolor de cabeza muy a menudo puede llevar a la enfermedad conocida como "dolor de cabeza por rebote". Un patrón de abuso de medicamentos para el dolor de cabeza supone tomarlos más de dos veces por semana o en cantidades excesivas. Esto significa más que lo que indica el envase o el médico. Con los dolores de cabeza por rebote, los medicamentos no sólo dejan de aliviar el dolor sino que además comienzan a ocasionar dolores de cabeza. Los médicos tratan los dolores de cabeza por rebote mediante la disminución del medicamento del que se ha abusado. A veces el medico podrá sustituir gradualmente por un tipo diferente de tratamiento o medicación. Dejar de consumirlo podría ser difícil. El abuso regular de un medicamento aumenta el potencial que se produzcan efectos secundarios graves. Consulte con un médico si utiliza regularmente medicamentos para el dolor de cabeza más de dos días por semana o más de lo que indica el envase.  PREGUNTAS Y RESPUESTAS ADICIONALES  P: ¿Qué es la biorretroalimentación?  R: La biorretroalimentación es un tratamiento de autoayuda. La biorretroalimentación utiliza un equipamiento especial para controlar los movimientos involuntarios del cuerpo y las respuestas físicas. La biorretroalimentación controla:  · Respiración.  · Pulso.  · Latidos cardíacos.  · Temperatura.  · Tensión muscular.  · Actividad cerebrales.  La  biorretroalimentación le ayudará a mejorar y perfeccionar sus ejercicios de relajación. Aprenderá a controlar las respuestas físicas relacionadas con el estrés. Una vez que se dominan las técnicas no necesitará más el equipamiento.  P: ¿Son hereditarios los dolores de cabeza?  R: Según algunas estimaciones, aproximadamente 28 millones de estadounidenses sufren migraña. Cuatro de cada cinco (80%) informan una historia familiar de migraña. Los investigadores no pueden asegurar si se trata de un problema genético o una predisposición familiar. A pesar de esto, un niño tiene 50% de probabilidades de sufrir migraña si uno de sus padres la sufre. El niño tiene un 75% de probabilidades si ambos padres la sufren.   P. ¿Puede un niño tener migraña?  R: En el momento de ingresar a la escuela secundaria, la mayoría de los jóvenes han experimentado algún tipo de cefalea. Algunos abordajes o medicamentos seguros y efectivos pueden evitar las cefaleas o detenerlas luego de que han comenzado.   P. ¿Qué tipo de   especialista debe ver para diagnosticar y tratar una cefalea?  R: Comience con su médico de cabecera. Converse acerca de su experiencia y abordaje de las cefaleas. Comente los métodos de clasificación, diagnóstico y tratamiento. El profesional decidirá si lo derivará a un especialista, según los síntomas u otras enfermedades. El hecho de sufrir diabetes, alergias, etc, puede requerir un abordaje más complejo. La [employer] ([employer] Cefaleas) proporcionará, a pedido, una lista de los médicos que son miembros de su estado.  Document Released: 08/18/2008 Document Revised: 11/28/2011  [employer]® Patient Information ©2014 [employer].

## 2013-09-19 ENCOUNTER — Emergency Department (HOSPITAL_COMMUNITY): Payer: No Typology Code available for payment source

## 2013-09-19 ENCOUNTER — Emergency Department (HOSPITAL_COMMUNITY)
Admission: EM | Admit: 2013-09-19 | Discharge: 2013-09-19 | Disposition: A | Discharge status: Home / Self Care | Payer: No Typology Code available for payment source | Attending: Emergency Medicine | Admitting: Emergency Medicine

## 2013-09-19 ENCOUNTER — Encounter (HOSPITAL_COMMUNITY): Payer: Self-pay | Admitting: Emergency Medicine

## 2013-09-19 DIAGNOSIS — R319 Hematuria, unspecified: Secondary | ICD-10-CM | POA: Insufficient documentation

## 2013-09-19 DIAGNOSIS — Z79899 Other long term (current) drug therapy: Secondary | ICD-10-CM | POA: Insufficient documentation

## 2013-09-19 DIAGNOSIS — Y92009 Unspecified place in unspecified non-institutional (private) residence as the place of occurrence of the external cause: Secondary | ICD-10-CM | POA: Insufficient documentation

## 2013-09-19 DIAGNOSIS — R109 Unspecified abdominal pain: Secondary | ICD-10-CM | POA: Insufficient documentation

## 2013-09-19 DIAGNOSIS — R42 Dizziness and giddiness: Secondary | ICD-10-CM | POA: Insufficient documentation

## 2013-09-19 DIAGNOSIS — Y939 Activity, unspecified: Secondary | ICD-10-CM | POA: Insufficient documentation

## 2013-09-19 DIAGNOSIS — J45909 Unspecified asthma, uncomplicated: Secondary | ICD-10-CM | POA: Insufficient documentation

## 2013-09-19 DIAGNOSIS — R630 Anorexia: Secondary | ICD-10-CM | POA: Insufficient documentation

## 2013-09-19 DIAGNOSIS — S060X0A Concussion without loss of consciousness, initial encounter: Secondary | ICD-10-CM

## 2013-09-19 DIAGNOSIS — K219 Gastro-esophageal reflux disease without esophagitis: Secondary | ICD-10-CM | POA: Insufficient documentation

## 2013-09-19 DIAGNOSIS — R51 Headache: Secondary | ICD-10-CM

## 2013-09-19 DIAGNOSIS — N889 Noninflammatory disorder of cervix uteri, unspecified: Secondary | ICD-10-CM | POA: Insufficient documentation

## 2013-09-19 DIAGNOSIS — R519 Headache, unspecified: Secondary | ICD-10-CM

## 2013-09-19 DIAGNOSIS — W07XXXA Fall from chair, initial encounter: Secondary | ICD-10-CM | POA: Insufficient documentation

## 2013-09-19 DIAGNOSIS — W1809XA Striking against other object with subsequent fall, initial encounter: Secondary | ICD-10-CM | POA: Insufficient documentation

## 2013-09-19 DIAGNOSIS — IMO0002 Reserved for concepts with insufficient information to code with codable children: Secondary | ICD-10-CM | POA: Insufficient documentation

## 2013-09-19 DIAGNOSIS — M533 Sacrococcygeal disorders, not elsewhere classified: Secondary | ICD-10-CM

## 2013-09-19 LAB — URINALYSIS, ROUTINE W REFLEX MICROSCOPIC
BILIRUBIN URINE: NEGATIVE
GLUCOSE, UA: NEGATIVE mg/dL
KETONES UR: NEGATIVE mg/dL
Leukocytes, UA: NEGATIVE
Nitrite: NEGATIVE
PH: 7 (ref 5.0–8.0)
PROTEIN: NEGATIVE mg/dL
Specific Gravity, Urine: 1.013 (ref 1.005–1.030)
Urobilinogen, UA: 1 mg/dL (ref 0.0–1.0)

## 2013-09-19 LAB — URINE MICROSCOPIC-ADD ON

## 2013-09-19 LAB — HCG, QUANTITATIVE, PREGNANCY: hCG, Beta Chain, Quant, S: <1 m[IU]/mL (ref <5)

## 2013-09-19 MED ORDER — HYDROCODONE-ACETAMINOPHEN 5-325 MG PO TABS
1.0000 | ORAL_TABLET | Freq: Once | ORAL | Status: AC
  Administered 2013-09-19: 1 via ORAL
  Filled 2013-09-19: qty 1

## 2013-09-19 MED ORDER — HYDROCODONE-ACETAMINOPHEN 5-325 MG PO TABS: 1.0000 | ORAL_TABLET | ORAL | Status: DC | PRN

## 2013-09-19 NOTE — ED Provider Notes (Signed)
CSN: [id number]     Arrival date & time 09/19/13  1615 History   First MD Initiated Contact with Patient 09/19/13 1822     Chief Complaint  Patient presents with  . Fall  . Head Injury  . Tailbone Pain   (Consider location/radiation/quality/duration/timing/severity/associated sxs/prior Treatment) HPI Comments: Alexandra Aguirre is a 38 year-old female with a past medical history of GERD and Asthma, presenting the Emergency Department with a chief complaint of headache and back pain.  She reports she was standing on a chair when she lost her balance and fell on to her bottom and hit her head on a table.  She denies LOC.  She reports feeling lightheaded and confused for a short time after the fall.  She was able to immediately speak and say "my tailbone hurts" to her children.  She reports a constant occipital headache which is aggravated by palpation.  The patient also reports she has hematuria. Patient's last menstrual period was 08/27/2013. No history of a Pap smear.   The history is provided by the patient. A language interpreter was used.    Past Medical History  Diagnosis Date  . GERD (gastroesophageal reflux disease)   . Asthma    No past surgical history on file. No family history on file. History  Substance Use Topics  . Smoking status: Never Smoker   . Smokeless tobacco: Not on file  . Alcohol Use: No   OB History   Grav Para Term Preterm Abortions TAB SAB Ect Mult Living                 Review of Systems  Constitutional: Negative for fever and chills.  Gastrointestinal: Positive for abdominal pain. Negative for vomiting, diarrhea, constipation, blood in stool and anal bleeding.  Genitourinary: Positive for hematuria.  Musculoskeletal: Positive for back pain and gait problem.  Neurological: Positive for light-headedness and headaches. Negative for seizures.    Allergies  Review of patient's allergies indicates no known allergies.  Home Medications   Current Outpatient  Rx  Name  Route  Sig  Dispense  Refill  . acetaminophen (TYLENOL) 500 MG tablet   Oral   Take 500 mg by mouth daily as needed for moderate pain.         Marland Kitchen albuterol (PROVENTIL HFA;VENTOLIN HFA) 108 (90 BASE) MCG/ACT inhaler   Inhalation   Inhale 2 puffs into the lungs every 6 (six) hours as needed for wheezing or shortness of breath.         Marland Kitchen ibuprofen (ADVIL,MOTRIN) 200 MG tablet   Oral   Take 200 mg by mouth daily as needed for mild pain.         Marland Kitchen omeprazole (PRILOSEC) 20 MG capsule   Oral   Take 20 mg by mouth daily.         Marland Kitchen topiramate (TOPAMAX) 50 MG tablet   Oral   Take 50 mg by mouth daily as needed (for headache).          BP 97/47  Pulse 65  Temp(Src) 98.2 F (36.8 C) (Oral)  Resp 18  SpO2 99%  LMP 08/28/2013 Physical Exam  Nursing note and vitals reviewed. Constitutional: She is oriented to person, place, and time. She appears well-developed and well-nourished. No distress.  Appears uncomfortable  HENT:  Head: Normocephalic. Head is with raccoon's eyes and with contusion. Head is without Battle's sign, without abrasion and without laceration.    Eyes: EOM are normal. Pupils are equal, round, and  reactive to light.  Neck: Neck supple.  Cardiovascular: Normal rate and regular rhythm.   Pulmonary/Chest: Effort normal. She has no decreased breath sounds. She has no wheezes. She has no rales.  Abdominal: Soft. Normal appearance. There is tenderness in the suprapubic area. There is no rigidity, no guarding, no CVA tenderness, no tenderness at McBurney's point and negative Murphy's sign.  Genitourinary:  Bleeding per cervical os.  Cervix with raised condyloma like abnormality appears to be small  at the 3 o'clock position. Cervix tender to palpation and hard.  Uterus without large palpable mass.  Musculoskeletal:       Back:  No midline C-spine, T-spine, or L-spine tenderness with no step-offs, crepitus, or deformities noted. Tenderness to Coccyx, no  crepitus, no ecchymosis.     Neurological: She is alert and oriented to person, place, and time. No cranial nerve deficit or sensory deficit. She exhibits normal muscle tone. Coordination normal. GCS eye subscore is 4. GCS verbal subscore is 5. GCS motor subscore is 6.  Skin: Skin is warm. No rash noted.  Psychiatric: She has a normal mood and affect. Her behavior is normal.    ED Course  Procedures (including critical care time) Labs Review Labs Reviewed  URINALYSIS, ROUTINE W REFLEX MICROSCOPIC - Abnormal; Notable for the following:    Hgb urine dipstick LARGE (*)    All other components within normal limits  HCG, QUANTITATIVE, PREGNANCY  URINE MICROSCOPIC-ADD ON   Imaging Review No results found.  EKG Interpretation   None       MDM   1. Coccyx pain   2. Cervix abnormality   3. Concussion, without loss of consciousness, initial encounter   4. Headache    Pt with a history of fall presents several days after.  No neurologic findings, mild tenderness to occipital scalp.  GU exam concerning for a cervical lesion, patient without a history of pap smear and will have her follow up with OB/GYN. Coccyx likely contusion. Discussed patient history, condition, and labs with Dr. Karma GanjaLinker who agrees the patient can be evaluated as an out-pt. [facility], he will assume patient care at this time.     Clabe SealLauren M Kinta Martis, PA-C 09/21/13 1650

## 2013-09-19 NOTE — Discharge Instructions (Signed)
Your x-rays did not show any broken bones. Please followup with a primary care provider for an OB/GYN specialist for further evaluations of your abnormal pelvic exam and cervix findings.    Concusin - Adultos (Concussion, Adult) Una concusin, o traumatismo cerebral cerrado, es una lesin cerebral causada por un golpe directo en la cabeza o por un movimiento rpido y brusco sacudida) de la cabeza o el cuello. Generalmente no pone en peligro la vida. An as, los efectos de una concusin pueden ser graves. Si sufri una concusin antes, es ms probable que experimente sntomas similares a una concusin despus de un golpe directo en la cabeza.  CAUSAS   Un golpe directo en la cabeza, como al chocar contra otro jugador en un partido de ftbol, recibir un golpe en una lucha o golpearse la cabeza con una superficie dura.  Una sacudida de la cabeza o el cuello que hace que el cerebro se mueva de adelante hacia atrs dentro del crneo, como en un choque automovilstico. SIGNOS Y SNTOMAS  Los signos de una concusin pueden ser difciles de Chief Strategy Officer. En un primer momento, los pacientes, familiares y profesionales tal vez no los adviertan. Puede ser que aparentemente est normal pero que acte o se sienta diferente. Los sntomas son transitorios pero generalmente duran [address], [address] o an ms. Algunos sntomas pueden aparecer inmediatamente mientras otros pueden manifestarse despus de algunas horas o [address]  [address]. Cada lesin en la cabeza es diferente. Los sntomas son:   Dolor de cabeza leve a moderado, que no se [place].  Sensacin de presin dentro de la cabeza.  Presentar ms dificultad que lo habitual para:  Aprender o recordar cosas que ha escuchado.  Responder preguntas.  Prestar atencin o concentrarse.  Organizar las tareas diarias.  Tomar decisiones y [employer].  Lentitud para pensar, actuar, hablar o leer.  Sentirse perdido o fcilmente confundido.  Sentirse cansado  todo el tiempo o con falta de Engineer, drilling (fatigado).  Sentirse somnoliento.  Trastornos del sueo.  Dormir ms que lo habitual.  Dormir menos que lo habitual.  Problemas para conciliar el sueo.  Problemas para dormir (insomnio).  Prdida del equilibrio, sensacin de mareo.  Nuseas o vmitos.  Adormecimiento u hormigueo.  Mayor sensibilidad para:  Los sonidos.  Las luces.  Distracciones.  Problemas visuales o fcil cansancio en los ojos.  Prdida del sentido del gusto o Cabin crew.  Pitidos en el odo.  Cambios en el humor como sentirse triste o ansioso.  Irritacin, enojo por cosas pequeas o sin motivos.  Falta de motivacin.  Ver o escuchar cosas que otras personas no ven ni escuchan (alucinaciones). DIAGNSTICO  El mdico diagnosticar una concusin basndose en la descripcin del traumatismo y los sntomas. Le preguntar si sufri un desmayo (perdi la conciencia) y si tiene dificultad para recordar los sucesos ocurridos inmediatamente antes y Therapist, sports traumatismo.  La evaluacin tambin puede incluir:   Un escner cerebral para encontrar signos de lesin cerebral. Aunque los estudios no [employer], igual puede haber sufrido una concusin.  Anlisis de sangre para asegurarse de que no hay otros problemas. TRATAMIENTO   La mayor parte de las concusiones se tratan en el servicio de emergencias o en el consultorio mdico. Es posible que Hydrologist en el hospital durante la noche para completar el [place].  Informe a su mdico si toma medicamentos, incluyendo los medicamentos recetados, medicamentos de venta libre y remedios naturales. Algunos medicamentos, como los anticoagulantes y la aspirina, pueden aumentar la probabilidad de sufrir  complicaciones. Tambin informe a su mdico si ha bebido alcohol o consume drogas. Esta informacin puede Licensed conveyancer.  El profesional le dar el alta con algunas instrucciones que deber seguir.  La  velocidad de recuperacin de una concusin depende de varios factores. Entre ellos se incluyen la gravedad de la concusin, la zona del cerebro lesionada, la edad y [employer] de salud previo a la lesin.  La [employer]son de las personas que han sufrido una lesin leve se recuperan completamente. La recuperacin puede llevar tiempo. En general, la recuperacin es ms lenta en las [employer]. A aquellas personas que ya han sufrido una concusin en el pasado, les llevar ms tiempo recuperarse de la lesin actual. INSTRUCCIONES PARA EL CUIDADO EN EL HOGAR  Instrucciones generales   Siga cuidadosamente las indicaciones que su mdico le ha dado.  Slo tome medicamentos de venta libre o recetados para Primary school teacher, Environmental health practitioner o bajar la [place], segn las indicaciones de su mdico.  Tome slo los medicamentos que su mdico le haya autorizado.  No beba alcohol hasta que su mdico lo autorice. El alcohol y otras sustancias pueden demorar su recuperacin y ponerlo en riesgo de nuevas lesiones.  Si le resulta ms difcil que lo habitual recordar las cosas, escrbalas.  Trate de hacer una cosa por vez si se distrae con facilidad. Por ejemplo, no mire televisin mientras prepara la cena.  Consulte con familiares y amigos si debe tomar decisiones importantes.  Cumpla con todas las visitas de control. Se recomienda realizar varias evaluaciones de los sntomas para favorecer su recuperacin.  Controle sus sntomas y pdale a otras personas que tambin lo hagan. En algunos casos pueden aparecer complicaciones luego de una concusin. Los [employer] con lesin cerebral Conservator, museum/gallery un mayor riesgo de complicaciones importantes como la formacin de un cogulo de sangre en el cerebro.  Informe a los [employer], al departamento de enfermera de la escuela, al consejero escolar, Conservation officer, historic buildings acerca de los sntomas y Engineer, structural que tiene. Hgales saber lo que puede y lo que no Scientist, product/process development. Ellos  debern observar:  Aumento en los problemas de atencin o Librarian, academic.  Aumento en dificultades de memoria o en el aprendizaje de informacin nueva.  Aumento del tiempo que necesita para completar tareas o encargos.  Aumento de la irritabilidad o disminucin de la capacidad para Social worker.  Aparicin de nuevos sntomas.  Reposo. El descanso favorece la curacin del cerebro. Asegrese de que:  Duerme bien por la noche. Evite quedarse despierto muy tarde por la noche.  Debe irse a dormir a la [employer] de [address] y los fines de [place].  Descanse Administrator. Promueva las siestas durante el da o momentos de descanso cuando parece cansado.  Limite las actividades que requieren mucho atencin o Librarian, academic. Ellas son:  Tareas para el hogar o trabajos relacionados con el empleo.  Mirar televisin.  Trabajar en la computadora.  Evite toda situacin en la que se pudiera producir otra lesin cerebral (ftbol, hockey, artes marciales, andar a caballo). El problema empeorar cada vez que experimente un sndrome postconmocional. Debe evitar estas actividades hasta que sea evaluado por el mdico correspondiente. Regreso a las [employer] regreso a las actividades habituales debe ser gradual. El cuerpo y el cerebro necesitan su tiempo para recuperarse.  No retome la prctica de deportes ni otras actividades deportivas hasta que su mdico le diga que puede [place].  Consulte a su mdico sobre cundo puede  conducir automviles, andar en bicicleta u operar maquinarias pesadas. La capacidad para reaccionar puede ser ms lenta luego de una lesin cerebral. Nunca realice estas actividades si se siente mareado.  Pregunte a su mdico cuando podr volver a la escuela o al Aleen Campi. Prevencin de otra concusin Es muy importante que evite otro golpe en la cabeza, especialmente antes de que se haya recuperado. En casos raros, un nuevo traumatismo puede causar  daos cerebrales permanentes, hinchazn del cerebro y [employer]. El riesgo es mayor durante los primeros 7 a 10 das despus de una lesin en la cabeza. Evite los traumatismos:   Use el cinturn de seguridad al conducir su automvil.  Beba alcohol slo con moderacin.  Use un casco cuando ande en bicicleta, esque, patine o realice actividades similares.  Evite actividades que podran causar una segunda conmocin cerebral, como deportes de contacto o recreativos hasta que su mdico lo autorice.  Implemente medidas de seguridad en el hogar.  Evite el desorden en pisos y escaleras.  Coloque barras en los baos y pasamanos en las escaleras.  Ponga alfombras antideslizantes en pisos y baeras.  Mejore la iluminacin en zonas de penumbra. SOLICITE ATENCIN MDICA SI:   Aumentan sus problemas de atencin o concentracin.  Aumentan las dificultades en la memoria o en el aprendizaje de informacin nueva.  Necesita ms tiempo que antes para completar las tareas o asignaciones.  Aumenta la irritabilidad o disminuye la capacidad para Social worker.  Tiene ms sntomas que antes. Solicite atencin mdica si presenta alguno de los siguientes sntomas durante ms de 2 semanas despus de su lesin:   Cefaleas que duran mucho (crnica).  Mareos o problemas con el equilibrio.  Nuseas.  Problemas de visin.  Aumento de la sensibilidad a los ruidos o a Statistician.  Depresin y cambios en el estado de nimo.  Ansiedad o irritabilidad.  Tiene problemas de [place].  Dificultad para concentrarse o Engineer, technical sales.  Problemas para dormir.  Cansancio permanente. SOLICITE ATENCIN MDICA DE INMEDIATO SI:   Los dolores de [place] son intensos o empeoran. Esto puede ser un signo de que hay un cogulo en la cabeza.  Siente debilidad (an si es slo en Edison Simon, una pierna o parte del rostro).  Siente entumecimiento.  Le falta coordinacin.  Vomita repetidas  veces.  Est ms somnoliento.  Una pupila est ms grande que la otra.  Sufre convulsiones.  Tiene dificultad para hablar.  Se siente repentinamente confuso. Esto puede ser un signo de que hay un cogulo en el cerebro.  Aumenta la confusin, la agitacin o la irritabilidad.  No puede Nutritional therapist o lugares.  Siente dolor en el cuello.  Le resulta difcil despertarse.  Tiene cambios de conducta inusuales.  Pierde la conciencia. ASEGRESE DE QUE:   Comprende estas instrucciones.  Controlar su afeccin.  Recibir ayuda de inmediato si no mejora o si empeora. Document Released: 08/18/2008 Document Revised: 05/08/2013 [facility] Patient Information 2014 [place], [place].    Traumatismo del cxis  (Tailbone Injury)  El cxis es el hueso pequeo que se encuentra en el extremo inferior de la columna vertebral. Una lesin en el cxis puede implicar la distensin de los tejidos, hematomas o un hueso roto (fracture). Las mujeres son ms vulnerables debido a que su pelvis es ms ancha.  CAUSAS  Este tipo de lesin generalmente se produce al caer sobre coxis. La tensin o la friccin repetida por actividades como el remo y el ciclismo tambin puede lesionar la  zona. El cccix puede lesionarse durante el parto. Las infecciones o tumores tambin pueden ejercer presin en el coxis y Programmer, multimedia. A veces, la causa es desconocida.  SNTOMAS   Hematomas.  Dolor al sentarse.  Movimientos intestinales dolorosos.  En las mujeres, dolor durante las [employer]. DIAGNSTICO  El mdico podr hacer el diagnstico basndose en los sntomas y el examen fsico Podrn tomarle una radiografa si se sospecha una fractura. Tambin podr indicarle una resonancia magntica para evaluar los sntomas.  TRATAMIENTO  El mdico podr prescribir algunos medicamentos para Primary school teacher. La mayora de las lesiones en el coxis mejoran por s mismas despus de 4 a 6 semanas. Sin embargo, si la  causa es una infeccin o un tumor, el tiempo de Paediatric nurse.  PREVENCIN  Use protectores y equipo deportivo adecuados , cuando practique ciclismo y remo. Esto puede ayudar a prevenir una lesin por tensin repetida o friccin.  INSTRUCCIONES PARA EL CUIDADO DOMICILIARIO  Aplique hielo sobre la zona lesionada.  Ponga el hielo en una bolsa plstica.  Colquese una toalla entre la piel y la bolsa de hielo.  Deje la bolsa de hielo durante 15 a 3 a 4 veces por da, durante los primeros 1  2 das.  Si se sienta sobre un aro de goma o inflable o sobre un almohadn podr Engineer, materials. Inclnese hacia adelante al sentarse para disminuir las [employer].  Evite estar sentado durante largos perodos.  Aumente la actividad cuando el dolor se lo permita.  Utilice los medicamentos de venta libre o de prescripcin para Chief Technology Officer, Environmental health practitioner o la [place], segn se lo indique el profesional que lo asiste.  Puede usar laxantes si siente dolor al mover el intestino, o segn las indicaciones del mdico.  Consuma una dieta rica en fibra para prevenir la constipacin.  Cumpla con todas las visitas de control, segn le indique su mdico. SOLICITE ATENCIN MDICA SI:  El dolor empeora.  Al mover el intestino siente una molestia intensa.  No puede mover el intestino.  Tiene fiebre. ASEGRESE DE QUE:   Comprende estas instrucciones.  Controlar su enfermedad.  Solicitar ayuda de inmediato si no mejora o si empeora. Document Released: 06/15/2005 Document Revised: 11/28/2011 [facility] Patient Information 2014 [place], [place].   Cncer de cuello uterino (Cervical Cancer) El cuello uterino es la abertura y la parte inferior del tero, que se encuentra entre la vagina y Careers information officer. El cncer de cuello uterino es un tipo de tumor bastante frecuente. Ocurre con ms frecuencia en mujeres entre los 40 y los 55 aos. Las clulas del cuello uterino actan en gran parte como las  clulas de la piel. Estas clulas estn expuestas a toxinas, virus y bacterias que pueden causar modificaciones anormales.  Hay dos tipos de cncer de cuello de tero:   Carcinoma de clulas escamosas: este tipo de cncer se inicia en las clulas planas, similares a escamas que recubren el cuello uterino. El carcinoma de clulas escamosas puede desarrollarse por una infeccin transmitida sexualmente causada por el virus del papiloma humano (VPH).   Adenocarcinoma: este tipo de cncer cervical se inicia en las clulas glandulares que cubren el cuello del tero: FACTORES DE RIESGO El riesgo de sufrir cncer de cuello uterino se relaciona con el estilo de vida, la historia sexual, la salud y Copywriter, advertising inmunolgico. Los riesgos de cncer de cuello uterino incluyen:   Tener una infeccin viral de transmisin sexual. Se incluyen:  Clamidia.   Herpes.  VPH.  Ser sexualmente activa antes de los 18 aos.   Tener ms de una pareja sexual o tener sexo con alguien que tenga ms de una pareja sexual.   No usar condones con sus Chief Strategy Officer.   Haber sufrido cncer en la vagina o en la vulva.   Tener una pareja sexual que tenga o haya sufrido cncer en el pene o que haya tenido una compaera sexual con displasia o cncer de cuello uterino.   Usar anticonceptivos orales (tambin llamados pldoras para el control de la natalidad).  El hbito de fumar.   Tener un sistema inmunolgico debilitado. Por ejemplo padecer el virus de inmunodeficiencia humano (HIV) u otros trastornos de dficit inmunolgico.   Ser hija de Nurse, mental health que tom dietilestilbestrol (DES) durante el Psychiatrist.   Tener una hermana o madre que hayan sufrido cncer de cuello uterino.   Pertenecer a Engineer, production, asitica o ser [employer] de las [place].   Historia de displasia en el cuello uterino. SIGNOS Y SNTOMAS  Los sntomas generalmente no estn presentes en las primeras etapas. Una  vez que el cncer invade el cuello uterino y los tejidos que lo rodean, la mujer puede tener:   Sangrado vaginal anormal o sangrado menstrual que es ms prolongado que lo habitual.   Sangrar despus de Child psychotherapist sexuales, de hacerse una ducha vaginal o un Papanicolau.   Sangrado vaginal luego de la menopausia.   Flujo vaginal anormal.  Molestias o dolor plvico.  Papanicolau anormal.  Dolor durante las relaciones sexuales. Los sntomas de cncer de cuello uterino ms avanzado pueden incluir:   Prdida del apetito o prdida de peso.   Cansancio (fatiga).   Dolor en la espalda y en las piernas.   Incapacidad de Scientist, physiological orina o los movimientos intestinales. DIAGNSTICO  Se indicar un examen plvico y un Papanicolau para diagnosticar la enfermedad. Si se encuentran anormalidades durante el Papanicolau, deber repetirse a los 3 meses, o el mdico podr indicar estudios o procedimientos adicionales como:   Colposcopa: Este procedimiento utiliza un microscopio especial que permite al [employer] aumentar y Ecologist de cerca las clulas del cuello uterino, la vagina y la vulva.   Biopsia cervical: es un procedimiento en el que se toman pequeas muestras de tejido del cuello del tero para que un especialista las examine en el microscopio.   Biopsia en cono: este procedimiento se realiza para evaluar o para extirpar tejido canceroso.  Puede ser necesario realizar ms pruebas, por ejemplo:   Una cistoscopa.   Proctoscopa o sigmoideoscopa.   Ecografas.   Tomografa computada.   Resonancia magntica.   Laparoscopa.  El cncer de cuello uterino tiene diferentes etapas:   Etapa 0, carcinoma in situ (CIS): esta es la primera etapa del cncer y es la ltima etapa y la ms grave de la displasia.   Etapa 1: significa que el tumor se encuentra slo en el tero y en el cuello uterino.   Etapa 2: significa que el tumor se ha diseminado hacia la zona  superior de la vagina. El cncer se ha diseminado ms all del tero, pero no a las paredes de la pelvis o hacia el tercio inferior de la vagina.   Etapa 3: significa que el tumor ha invadido las paredes laterales de la pelvis y el tercio inferior de la vagina. La obstruccin de los conductos que llevan la orina (urteres) debido al tumor puede hacer que la orina vuelva hacia arriba y los riones  se hinchen (hidronefrosis).   Etapa 4: significa que el tumor se ha diseminado hacia el recto o la vejiga. En una etapa posterior de esta etapa, puede diseminarse a rganos distantes, como los pulmones.  TRATAMIENTO  Las opciones de tratamiento son:   Biopsia en cono para retirar el tejido canceroso.   Extirpacin del todo el tero y el cuello uterino.   Extirpacin del tero, el cuello uterino, la porcin superior de la vagina, los ganglios linfticos y los tejidos que los rodean (histerectoma radical modificada). Los ovarios puede dejarse o ser extirpados.   Medicamentos para tratar Management consultantel cncer.   Una combinacin de [place], radiacin y quimioterapia.   Modificadores de Paediatric nursela respuesta biolgica. Estas son sustancias que ayudan a Mining engineerfortalecer el sistema inmunolgico en su lucha contra el cncer o las infecciones. Pueden utilizarse en combinacin con la quimioterapia.  INSTRUCCIONES PARA EL CUIDADO EN EL HOGAR   Hgase un examen ginecolgico y un Papanicolau una vez por ao, o segn las indicaciones de su mdico.   Aplquese la vacuna contra el VPH.   No fume.  No tenga relaciones sexuales hasta que el mdico la autorice.  Use un condn cada vez que tenga The St. Paul Travelersrelaciones sexuales. SOLICITE ATENCIN MDICA SI:   Aumenta el dolor abdominal o plvico.   Se siente cada vez ms cansada.   Aumenta el dolor en la pierna o en la espalda.   Tiene fiebre.  Observa una hemorragia o secrecin vaginal anormal.  Pierde peso. SOLICITE ATENCIN MDICA DE INMEDIATO SI:   No puede  orinar.  Observa sangre en la orina.   Maxie Betterbserva sangre o siente presin al mover el intestino.   Comienza a sentir dolor intenso en la espalda, el estmago o la pelvis. Document Released: 12/31/2012 Document Revised: 05/08/2013 [facility] Patient Information 2014 [place].

## 2013-09-19 NOTE — ED Notes (Signed)
Pt states she fell off a chair yesterday at home. Pt reports pain in bottom and posterior head from hitting it on glass frame. Pt reports vaginal bleeding began after fall. Reports bleeding has been heavier today. Reports LOC with fall. Pt reports feeling dizzy after fall, confused. Pt reports she took tylenol and ibuprofen with no relief. Pt currently awake, alert, oriented x4. MAE.

## 2013-09-19 NOTE — ED Notes (Signed)
PA at bedside.

## 2013-09-20 NOTE — ED Provider Notes (Signed)
Alexandra Aguirre S 8:00 PM patient discussed in sign out. Patient fell while standing on a chair onto her bottom and backwards hitting the back of her head. No LOC. Urinalysis and pregnancy test pending.  Spoke with patient. She continues to have pain and headache despite pain medication. She has significant tenderness over the tailbone area. No gross deformity. Discussed options for x-ray to evaluate for fracture. I did discuss that this would not necessary change treatment plan of pain medications and comfortable seating. Patient doesn't wish to have x-rays performed regardless. She has a normal nonfocal neuro exam. She describes symptoms of headache, lightheadedness and some increased fatigue. Symptoms consistent with mild concussion. There was no LOC. No indications for imaging of the skull and brain.  X-rays of sacrum show no fractures. Patient will be given prescriptions for medications to treat symptoms. Also given instructions on concussion symptoms.  Angus SellerPeter S Advait Buice, PA-C 09/20/13 0430

## 2013-09-21 ENCOUNTER — Encounter (HOSPITAL_COMMUNITY): Payer: Self-pay

## 2013-09-21 ENCOUNTER — Inpatient Hospital Stay (HOSPITAL_COMMUNITY)
Admission: AD | Admit: 2013-09-21 | Discharge: 2013-09-21 | Disposition: A | Discharge status: Home / Self Care | Payer: No Typology Code available for payment source | Source: Ambulatory Visit | Attending: Family Medicine | Admitting: Family Medicine

## 2013-09-21 DIAGNOSIS — N898 Other specified noninflammatory disorders of vagina: Secondary | ICD-10-CM

## 2013-09-21 DIAGNOSIS — N949 Unspecified condition associated with female genital organs and menstrual cycle: Secondary | ICD-10-CM | POA: Insufficient documentation

## 2013-09-21 DIAGNOSIS — Z87410 Personal history of cervical dysplasia: Secondary | ICD-10-CM | POA: Insufficient documentation

## 2013-09-21 DIAGNOSIS — N925 Other specified irregular menstruation: Secondary | ICD-10-CM | POA: Insufficient documentation

## 2013-09-21 DIAGNOSIS — N939 Abnormal uterine and vaginal bleeding, unspecified: Secondary | ICD-10-CM

## 2013-09-21 DIAGNOSIS — R51 Headache: Secondary | ICD-10-CM | POA: Insufficient documentation

## 2013-09-21 DIAGNOSIS — N938 Other specified abnormal uterine and vaginal bleeding: Secondary | ICD-10-CM | POA: Insufficient documentation

## 2013-09-21 NOTE — ED Provider Notes (Signed)
Medical screening examination/treatment/procedure(s) were performed by non-physician practitioner and as supervising physician I was immediately available for consultation/collaboration.  EKG Interpretation   None        Ethelda ChickMartha K Linker, MD 09/21/13 [phone]

## 2013-09-21 NOTE — MAU Provider Note (Signed)
History     CSN: [id number]  Arrival date and time: 09/21/13 1411   None     Chief Complaint  Patient presents with  . Vaginal Bleeding   HPI Alexandra Aguirre is 38 y.o. G1P1001 presents for vaginal bleeding. She is concerned because the discharge information she got on 09/19/13 mentioned she may have cervical cancer because she was bleeding earlier than her expected menses and she had a hx of dysplasia with treatment in 2007. She states she had [facility].  Was seen in October per her report for OCP renewal.  She is also [facility].  She was [facility] after a fall after standing on a chair and hitting the back of her head.  LOC after fall.  Witnessed by her children. She complained at that visit of headache, lightheadedness, and fatigue.  States the headache continues but not worsening. Denies nausea, vomiting, loss of balance/conciousness.  Decreased appetite.  She was having vaginal  bleeding on that visit that was 3 days early.  She states she had 3 pills left.  Pelvic exam confirmed vaginal bleeding.  She had a neg neuro exam per provider's note. She was given meds for pain, XRAY of sacrum was negative.  States with help of interpreter, that her LMP 12/8.  Did not have vaginal bleeding until 09/18/14 when she fell.  She normally doesn't have clots with periods but has seen some small one with this episode.  She is sexually active with husband only and is taking OCPs for contraception but does not have with her, ? Name.She complains of continued headache--taking hydrocodone Rx from [facility] but is not helping.   Orders placed during the hospital encounter of 05/04/12  . ED EKG  . ED EKG  . EKG 12-LEAD  . EKG 12-LEAD  . ED EKG  . ED EKG  . EKG      Past Medical History  Diagnosis Date  . GERD (gastroesophageal reflux disease)   . Asthma     History reviewed. No pertinent past surgical  history.  No family history on file.  History  Substance Use Topics  . Smoking status: Never Smoker   . Smokeless tobacco: Not on file  . Alcohol Use: No    Allergies: No Known Allergies  Prescriptions prior to admission  Medication Sig Dispense Refill  . acetaminophen (TYLENOL) 500 MG tablet Take 500 mg by mouth daily as needed for moderate pain.      Marland Kitchen. albuterol (PROVENTIL HFA;VENTOLIN HFA) 108 (90 BASE) MCG/ACT inhaler Inhale 2 puffs into the lungs every 6 (six) hours as needed for wheezing or shortness of breath.      Marland Kitchen. HYDROcodone-acetaminophen (NORCO/VICODIN) 5-325 MG per tablet Take 1-2 tablets by mouth every 4 (four) hours as needed for moderate pain.  20 tablet  0  . ibuprofen (ADVIL,MOTRIN) 200 MG tablet Take 200 mg by mouth daily as needed for mild pain.      Marland Kitchen. omeprazole (PRILOSEC) 20 MG capsule Take 20 mg by mouth daily.      Marland Kitchen. topiramate (TOPAMAX) 50 MG tablet Take 50 mg by mouth daily as needed (for headache).        Review of Systems  Constitutional: Negative for fever and chills.  Eyes: Positive for blurred vision (when she takes Percocet).  Gastrointestinal: Negative for abdominal pain.  Genitourinary:       +  for vaginal bleeding  Musculoskeletal: Positive for back pain.  Neurological: Positive for headaches. Negative for dizziness, sensory change, speech change and loss of consciousness.   Physical Exam   Height 5' 0.25" (1.53 m), weight 191 lb 6 oz (86.807 kg), last menstrual period 08/28/2013.  Physical Exam  Constitutional: She is oriented to person, place, and time. She appears well-developed and well-nourished. No distress.  HENT:  Head: Normocephalic.  Neck: Normal range of motion.  Cardiovascular: Normal rate.   Respiratory: Effort normal.  GI: Soft. She exhibits no distension and no mass. There is no tenderness. There is no rebound and no guarding.  Genitourinary: There is no rash, tenderness or lesion on the right labia. There is no rash,  tenderness or lesion on the left labia. Uterus is not enlarged and not tender. Cervix exhibits no discharge. Right adnexum displays no mass, no tenderness and no fullness. Left adnexum displays no mass, no tenderness and no fullness. There is bleeding (small amount of red blood without clots.  vaginal cleaned with 2 foxswabs. No active bleeding) around the vagina. No erythema or tenderness around the vagina. No vaginal discharge found.  Neurological: She is alert and oriented to person, place, and time.  Skin: Skin is warm and dry.  Psychiatric: She has a normal mood and affect. Her behavior is normal.   No results found for this or any previous visit (from the past 24 hour(s)). Procedures  MDM  Discussed with the patient the physical findings, hx of dysplasia, ED's concern and wanting appropriate follow up.  Interpreter in room with me.    Assessment and Plan  A: vaginal bleeding     Remote hx of dysplasia 2007    Concern for cancer     S/P fall with headache-[facility]  P:  Continue medication given at [facility] for headache-discuss med is a narcotic and she may have drowsiness with medication     Discussed at length, the vaginal bleeding, hx of AGC, appropriate follow up.  She is a patient at [facility]--instructed to call them for review of last pap,? Need for additional screening.       Return to [facility] for any neurological changes--discussed increased headache. Loss of consciousness, nausea/vomiting, loss of balance      Rekisha Welling,EVE M 09/21/2013, 3:25 PM

## 2013-09-21 NOTE — Discharge Instructions (Signed)
Contusin  (Contusion)  Una contusin es un hematoma interno. Las contusiones son el resultado de un traumatismo que produce un sangrado debajo de la piel. La contusin [employer], prpura o [place]. Un traumatismo menor ocasionar un hematoma indoloro, pero las contusiones ms importantes pueden doler y Personal assistant hinchadas durante varias semanas.  CAUSAS  Generalmente la causa de la contusin es un golpe, un traumatismo o una fuerza directa ejercida en una zona del cuerpo.  SNTOMAS   Hinchazn y enrojecimiento en la zona lesionada.  Hematoma en la zona lesionada.  Sensibilidad e inflamacin en la zona lesionada.  Dolor. DIAGNSTICO  El diagnstico puede hacerse a travs de la historia clnica y el examen fsico. Ser necesaria una radiografa o una tomografa computada para determinar si hay lesiones asociadas, como fracturas.  TRATAMIENTO  El tratamiento especfico depender de qu parte del cuerpo se lesion. En general, el mejor tratamiento para una contusin es el reposo, hielo, elevacin, y la aplicacin de compresas fras en el rea afectada. Tambin se recomiendan los medicamentos de venta libre para el control del dolor. Pregunte a su mdico cul es el mejor tratamiento para su contusin.  INSTRUCCIONES PARA EL CUIDADO EN EL HOGAR   Aplique hielo sobre la zona lesionada.  Ponga el hielo en una bolsa plstica.  Colquese una toalla entre la piel y la bolsa de hielo.  Deje el hielo durante 15 a 20 minutos, 3 a 4 veces por da.  Slo tome medicamentos de venta libre o recetados para Primary school teacher, las molestias o bajar la fiebre segn las indicaciones de su mdico. El mdico puede indicarle que evite los antiinflamatorios (aspirina, ibuprofeno y naproxeno) durante 48 horas debido a que estos medicamentos pueden aumentar el hematoma.  Haga que la zona lesionada repose.  En lo posible, eleve la zona lesionada para disminuir la hinchazn. SOLICITE ATENCIN MDICA DE  INMEDIATO SI:   El hematoma o la hinchazn aumentan.  Siente que Community education officer.  El dolor o la hinchazn no se alivian con los medicamentos. ASEGRESE DE QUE:   Comprende estas instrucciones.  Controlar su enfermedad.  Solicitar ayuda de inmediato si no mejora o si empeora. Document Released: 06/15/2005 Document Revised: 11/28/2011 [facility] Patient Information 2014 [place], [place].  Hemorragia uterina disfuncional (Dysfunctional Uterine Bleeding) Normalmente, los perodos menstruales comienzan en las jvenes de entre 11 y [place]. Un ciclo o perodo menstrual puede repetirse [name] [address] y los [address] y dura [place] 1 y [address] [employer] [address] y los [address] antes de que comience el ciclo menstrual, se produce la ovulacin (los ovarios producen vulos). Cuando cuente los periodos, hgalo desde Film/video editor del comienzo de la hemorragia del perodo anterior Engineering geologist de la hemorragia del perodo siguiente. La hemorragia uterina disfuncional (anormal) es diferente del perodo menstrual normal. Los perodos pueden comenzar antes o despus que lo habitual. Pueden ser menos abundantes, presentar cogulos, o ser ms abundantes. Puede tener [employer] perodos o Actor un perodo o ms. Puede tener hemorragia luego de [employer], despus de la menopausia o faltarle el perodo menstrual. CAUSAS  Embarazo (normal, aborto espontneo, embarazo ectpico).  DIU (dispositivo intrauterino, anticonceptivo)  Pldoras anticonceptivas  Tratamiento hormonal  La menopausia  Infeccin en el cervix.  Problemas de coagulacin.  Infecciones de la superficie interna del tero.  Endometriosis: la superficie interna del tero se desarrolla en la pelvis y otros rganos femeninos.  Adherencias (tejido cicatrizal) dentro del tero.  Obesidad o severa prdida de peso.  Plipos en el tero.  Cncer en el crvix, la vagina o el tero.  Quiste de ovarios o  Sndrome ovrico poliqustico.  Otras enfermedades (diabetes, enfermedad de la tiroides, Catering manageretc).  Fibromas en el tero (tumores no cancerosos).  Problemas con las hormonas femeninas.  Hiperplasia del endometrio: capa gruesa y clulas agrandadas dentro del tero.  Medicamentos que interfieren con la ovulacin.  Radiacin en la pelvis o el abdomen.  Quimioterapia. DIAGNSTICO  El mdico Radio broadcast assistantanalizar la historia de sus perodos [place] Charlesmenstruales, los medicamentos que toma, los cambios en el peso corporal, el estrs de la vida diaria y cualquier problema mdico que tenga.  El mdico realizar un examen fsico, incluyendo un examen plvico.  Tambin le solicitar anlisis para completar el diagnstico. Ellos son:  Papanicolau.  Anlisis de Kellytonsangre.  Cultivos para descartar infecciones.  Tomografa computada.  Ultrasonido.  Histeroscopa.  Laparoscopa.  Resonancia magntica.  Histerosalpingografa.  Dilatacin y curetaje.  Biopsia de endometrio. TRATAMIENTO El tratamiento depender de la causa de la hemorragia.. El tratamiento consiste en:  Observacin de los perodos menstruales durante algunos meses.  Prescripcin de medicamentos como:  Antibiticos:  Hormonas.  Pldoras anticonceptivas  Retirar el DIU (dispositivo intrauterino, anticonceptivo).  Ciruga:  Dilatacin y curetaje (raspado y remocin de tejido de la zona interna del tero).  Laparoscopa (examen del interior del abdomen con un tubo con luz).  Ablacin uterina (destruccin de la membrana que cubre el tero con corriente elctrica, rayos lser, congelamiento o calor ).  Histeroscopa (examen del cuello y el tero con un tubo con luz).  Histerectoma (extirpacin del tero). INSTRUCCIONES PARA EL CUIDADO DOMICILIARIO  Si el profesional que la asiste le prescribe medicamentos, tmelos tal como se le indic. No cambie ni reemplace medicamentos sin consultarlo con Mining engineerel profesional.  Las hemorragias de  larga duracin pueden traen como consecuencia un dficit de hierro. El profesional que lo asiste podr Pharmacist, hospitalprescribirle hierro. Esto ayuda a Scientific laboratory technicianreponer el hierro que el organismo pierde luego de una hemorragia abundante. Tome los medicamentos tal como se le indic.  No tome aspirina o medicamentos que la contengan u desde una semana antes del perodo menstrual ni durante el mismo. La aspirina puede hacer que la hemorragia empeore.  Si necesita cambiar el apsito o el tampn mas de una vez cada 2 horas, permanezca en cama con los pies elevados y aplique una compresa fra en la zona baja del abdomen. Descanse todo lo que pueda hasta que la hemorragia se detenga.  Consuma alimentos balanceados. Coma alimentos ricos en hierro. Por ejemplo:  Vegetales verdes frescos.  Cereales integrales y cereales y panes con salvado.  Huevos.  Carnes.  Hgado.  No trate de perder peso hasta que la hemorragia anormal se detenga y los niveles de hierro en la sangre vuelvan a la normalidad. No levante pesos de ms de 10 libras (4.5 kg.) ni realice actividades extenuantes mientras tenga la hemorragia.  Durante un par de meses tome nota en un almanaque y [employer] el comienzo y el fin de su perodo menstrual y el tipo de sangrado (escaso, Aftonmedio, Sac Cityabundante, gotas, cogulos o falta de perodo). El objetivo es que su mdico evale mejor el problema. SOLICITE ATENCIN MDICA SI:  Debe cambiar el apsito o el tampn ms de una vez cada hora.  Se siente mareada o dbil.  Tiene algn problema que pueda relacionarse con el medicamento que est tomando.  Siente dolor.  Desea retirar su DIU.  Quiere suspender o cambiar sus  pldoras anticonceptivas u hormonas.  Tiene algn tipo de hemorragia anormal mencionada anteriormente.  Tiene ms de 80 aos y an no ha tenido su perodo menstrual.  Tiene 1 aos y an tiene Environmental manager.  Tiene alguno de los sntomas mencionados anteriormente.  Aparece una erupcin  cutnea. SOLICITE ATENCIN MDICA DE INMEDIATO SI:  La temperatura oral se eleva sin motivo por encima de 38,9 C (102 F).  Comienza a sentir escalofros.  Debe cambiar el apsito o el tampn ms de una vez cada hora.  Siente dolor abdominal.  Pierde el conocimiento, se desmaya. Document Released: 06/15/2005 Document Revised: 05/30/2012 [facility] Patient Information 2014 [place], [place].

## 2013-09-21 NOTE — MAU Provider Note (Signed)
Attestation of Attending Supervision of Advanced Practitioner (PA/CNM/NP): Evaluation and management procedures were performed by the Advanced Practitioner under my supervision and collaboration.  I have reviewed the Advanced Practitioner's note and chart, and I agree with the management and plan.  Reva BoresPRATT,Kimari Coudriet S, MD [facility] Attending 09/21/2013 5:18 PM

## 2013-09-24 DIAGNOSIS — R87619 Unspecified abnormal cytological findings in specimens from cervix uteri: Secondary | ICD-10-CM

## 2013-09-24 HISTORY — DX: Unspecified abnormal cytological findings in specimens from cervix uteri: R87.619

## 2013-09-30 ENCOUNTER — Ambulatory Visit: Payer: No Typology Code available for payment source | Attending: Internal Medicine | Admitting: Internal Medicine

## 2013-09-30 ENCOUNTER — Encounter: Payer: Self-pay | Admitting: Internal Medicine

## 2013-09-30 VITALS — BP 108/67 | HR 82 | Temp 98.0°F | Resp 16 | Ht 61.0 in | Wt 191.0 lb

## 2013-09-30 DIAGNOSIS — M549 Dorsalgia, unspecified: Secondary | ICD-10-CM | POA: Insufficient documentation

## 2013-09-30 DIAGNOSIS — R609 Edema, unspecified: Secondary | ICD-10-CM

## 2013-09-30 DIAGNOSIS — K2990 Gastroduodenitis, unspecified, without bleeding: Secondary | ICD-10-CM

## 2013-09-30 DIAGNOSIS — K2970 Gastritis, unspecified, without bleeding: Secondary | ICD-10-CM | POA: Insufficient documentation

## 2013-09-30 DIAGNOSIS — R600 Localized edema: Secondary | ICD-10-CM | POA: Insufficient documentation

## 2013-09-30 DIAGNOSIS — G43909 Migraine, unspecified, not intractable, without status migrainosus: Secondary | ICD-10-CM | POA: Insufficient documentation

## 2013-09-30 LAB — POCT GLYCOSYLATED HEMOGLOBIN (HGB A1C): Hemoglobin A1C: 5.9

## 2013-09-30 MED ORDER — ESOMEPRAZOLE MAGNESIUM 40 MG PO CPDR: 40.0000 mg | DELAYED_RELEASE_CAPSULE | Freq: Every day | ORAL | Status: DC

## 2013-09-30 MED ORDER — TRAMADOL HCL 50 MG PO TABS: 50.0000 mg | ORAL_TABLET | Freq: Three times a day (TID) | ORAL | Status: DC | PRN

## 2013-09-30 NOTE — Progress Notes (Signed)
Patient ID: Alexandra Aguirre, female   DOB: [birth year], 38 y.o.   MRN: [id number] Patient Demographics  Alexandra Aguirre, is a 38 y.o. female  WUJ:[id number]  [id number]  DOB - 20-Sep-1975  Chief Complaint  Patient presents with  . Follow-up        Subjective:   Alexandra Aguirre is a 38 y.o. female here today for a follow up visit. The patient's only medical conditions include GERD, she is complaining of bilateral lower leg swelling especially in the mornings, but still feels like a tightening sometimes indentation. She does not have history of kidney fever of heart disease and no family history of these. She claims to eat right, she does not drink alcohol, she does not smoke cigarettes. She would like to know why her legs swells up in the morning, no facial swelling, no periorbital swelling. No abdominal pain, no urinary symptoms. Patient also has migraine which was seen in the ER recently for the same, she was prescribed Vicodin but she still takes, migraine is resolved. Patient has No headache, No chest pain, No abdominal pain - No Nausea, No new weakness tingling or numbness, No Cough - SOB.  ALLERGIES: No Known Allergies  PAST MEDICAL HISTORY: Past Medical History  Diagnosis Date  . GERD (gastroesophageal reflux disease)   . Asthma     MEDICATIONS AT HOME: Prior to Admission medications   Medication Sig Start Date End Date Taking? Authorizing Provider  acetaminophen (TYLENOL) 500 MG tablet Take 500 mg by mouth daily as needed for moderate pain.   Yes Historical Provider, MD  albuterol (PROVENTIL HFA;VENTOLIN HFA) 108 (90 BASE) MCG/ACT inhaler Inhale 2 puffs into the lungs every 6 (six) hours as needed for wheezing or shortness of breath.   Yes Historical Provider, MD  HYDROcodone-acetaminophen (NORCO/VICODIN) 5-325 MG per tablet Take 1-2 tablets by mouth every 4 (four) hours as needed for moderate pain. 09/19/13  Yes Peter S Dammen, PA-C  Multiple Vitamins-Minerals (MULTIVITAMIN PO) Take  1 tablet by mouth daily.   Yes Historical Provider, MD  omeprazole (PRILOSEC) 20 MG capsule Take 20 mg by mouth daily.   Yes Historical Provider, MD  topiramate (TOPAMAX) 50 MG tablet Take 50 mg by mouth daily as needed (for headache).   Yes Historical Provider, MD  esomeprazole (NEXIUM) 40 MG capsule Take 1 capsule (40 mg total) by mouth daily. 09/30/13   Jeanann Lewandowsky, MD     Objective:   Filed Vitals:   09/30/13 1655  BP: 108/67  Pulse: 82  Temp: 98 F (36.7 C)  TempSrc: Oral  Resp: 16  Height: 5\' 1"  (1.549 m)  Weight: 191 lb (86.637 kg)  SpO2: 95%    Exam General appearance : Awake, alert, not in any distress. Speech Clear. Not toxic looking HEENT: Atraumatic and Normocephalic, pupils equally reactive to light and accomodation Neck: supple, no JVD. No cervical lymphadenopathy.  Chest:Good air entry bilaterally, no added sounds  CVS: S1 S2 regular, no murmurs.  Abdomen: Bowel sounds present, Non tender and not distended with no gaurding, rigidity or rebound. Extremities:  B/L Lower Ext shows no edema, both legs are warm to touch Neurology: Awake alert, and oriented X 3, CN II-XII intact, Non focal Skin:No Rash Wounds:N/A   Data Review   CBC No results found for this basename: WBC, HGB, HCT, PLT, MCV, MCH, MCHC, RDW, NEUTRABS, LYMPHSABS, MONOABS, EOSABS, BASOSABS, BANDABS, BANDSABD,  in the last 168 hours  Chemistries   No results found for this basename: NA, K, CL, CO2,  GLUCOSE, BUN, CREATININE, GFRCGP, CALCIUM, MG, AST, ALT, ALKPHOS, BILITOT,  in the last 168 hours ------------------------------------------------------------------------------------------------------------------ No results found for this basename: HGBA1C,  in the last 72 hours ------------------------------------------------------------------------------------------------------------------ No results found for this basename: CHOL, HDL, LDLCALC, TRIG, CHOLHDL, LDLDIRECT,  in the last 72  hours ------------------------------------------------------------------------------------------------------------------ No results found for this basename: TSH, T4TOTAL, FREET3, T3FREE, THYROIDAB,  in the last 72 hours ------------------------------------------------------------------------------------------------------------------ No results found for this basename: VITAMINB12, FOLATE, FERRITIN, TIBC, IRON, RETICCTPCT,  in the last 72 hours  Coagulation profile  No results found for this basename: INR, PROTIME,  in the last 168 hours    Assessment & Plan   1. On and off Pedal edema Patient educated about symptoms, since it is on and off, we will need to do some workup to exclude hypoalbuminemia, kidney failure and liver dysfunction - CMP and Liver - TSH - POCT glycosylated hemoglobin (Hb A1C) his 5.9% today. Patient is prediabetic and has been informed, patient is extensively counseled about nutrition and exercise.  2. Migraine headache Continue Topamax and medication prescribed from the ER  3. Unspecified gastritis and gastroduodenitis without mention of hemorrhage  - esomeprazole (NEXIUM) 40 MG capsule; Take 1 capsule (40 mg total) by mouth daily.  Dispense: 30 capsule; Refill: 3  Patient was extensively counseled about nutrition and exercise Follow up in 3 months or when necessary    Interpreter was used to communicate directly with patient for the entire encounter including providing detailed patient instructions.   The patient was given clear instructions to go to ER or return to medical center if symptoms don't improve, worsen or new problems develop. The patient verbalized understanding. The patient was told to call to get lab results if they haven't heard anything in the next week.    Jeanann LewandowskyJEGEDE, Alexandra Neyman, MD, MHA, FACP, FAAP [facility] [place], [place] [phone]   09/30/2013, 5:32 PM

## 2013-09-30 NOTE — Progress Notes (Signed)
Pt is here following up on her migraines and GERD. Pt fell in December and injured her lower back today the pain scale is a 10

## 2013-10-01 LAB — CMP AND LIVER
ALK PHOS: 129 U/L — AB (ref 39–117)
ALT: 43 U/L — ABNORMAL HIGH (ref 0–35)
AST: 41 U/L — ABNORMAL HIGH (ref 0–37)
Albumin: 4 g/dL (ref 3.5–5.2)
BUN: 9 mg/dL (ref 6–23)
Bilirubin, Direct: <0.1 mg/dL (ref 0.0–0.3)
CO2: 26 meq/L (ref 19–32)
Calcium: 9.4 mg/dL (ref 8.4–10.5)
Chloride: 100 mEq/L (ref 96–112)
Creat: 0.56 mg/dL (ref 0.50–1.10)
Glucose, Bld: 154 mg/dL — ABNORMAL HIGH (ref 70–99)
Potassium: 3.8 mEq/L (ref 3.5–5.3)
SODIUM: 134 meq/L — AB (ref 135–145)
TOTAL PROTEIN: 7.5 g/dL (ref 6.0–8.3)
Total Bilirubin: 0.2 mg/dL — ABNORMAL LOW (ref 0.3–1.2)

## 2013-10-01 LAB — TSH: TSH: 1.571 u[IU]/mL (ref 0.350–4.500)

## 2013-10-08 ENCOUNTER — Telehealth: Payer: Self-pay | Admitting: *Deleted

## 2013-10-08 ENCOUNTER — Encounter (HOSPITAL_COMMUNITY): Payer: Self-pay

## 2013-10-08 ENCOUNTER — Encounter (INDEPENDENT_AMBULATORY_CARE_PROVIDER_SITE_OTHER): Payer: Self-pay

## 2013-10-08 ENCOUNTER — Ambulatory Visit (HOSPITAL_COMMUNITY)
Admission: RE | Admit: 2013-10-08 | Discharge: 2013-10-08 | Disposition: A | Discharge status: Home / Self Care | Payer: No Typology Code available for payment source | Source: Ambulatory Visit | Attending: Obstetrics and Gynecology | Admitting: Obstetrics and Gynecology

## 2013-10-08 ENCOUNTER — Other Ambulatory Visit: Payer: Self-pay | Admitting: Obstetrics and Gynecology

## 2013-10-08 VITALS — BP 100/72 | Temp 97.8°F | Ht 62.0 in | Wt 191.8 lb

## 2013-10-08 DIAGNOSIS — R87619 Unspecified abnormal cytological findings in specimens from cervix uteri: Secondary | ICD-10-CM | POA: Insufficient documentation

## 2013-10-08 DIAGNOSIS — Z1239 Encounter for other screening for malignant neoplasm of breast: Secondary | ICD-10-CM

## 2013-10-08 DIAGNOSIS — N644 Mastodynia: Secondary | ICD-10-CM | POA: Insufficient documentation

## 2013-10-08 HISTORY — DX: Unspecified abnormal cytological findings in specimens from cervix uteri: R87.619

## 2013-10-08 NOTE — Telephone Encounter (Signed)
Message copied by Raynelle CharyWINFREE, Avanthika Dehnert R on Tue Oct 08, 2013  2:40 PM ------      Message from: Quentin AngstJEGEDE, OLUGBEMIGA E      Created: Fri Oct 04, 2013  4:37 PM       Please inform patient that her lab results shows that her liver enzymes are slightly elevated and her blood sugar shows that she is at risk of developing diabetes. Will advise dietary control and regular exercise. We will repeat this test in about 3-6 month. Thyroid function is normal ------

## 2013-10-08 NOTE — Progress Notes (Signed)
Complaints of breast pain  Pap Smear:    Pap smear not completed today. [facility]. [facility] for a colposcopy and endometrial biopsy per recommendation. Appointment scheduled for Friday, November 01, 2013 at 0915. 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 A LEEP was completed 12/29/2003 for follow up of abnormal colposcopy. Last Pap smear result is scanned in EPIC under media.  Physical exam: Breasts Breasts symmetrical. No skin abnormalities bilateral breasts. No nipple retraction bilateral breasts. No nipple discharge bilateral breasts. No lymphadenopathy. No lumps palpated bilateral breasts. Complaints of right outer breast pain on exam. [facility]. Appointment scheduled for Tuesday, October 15, 2013 at 0900.     Pelvic/Bimanual No Pap smear completed today since last Pap smear was 09/24/2013 Pap smear not indicated per BCCCP guidelines.

## 2013-10-08 NOTE — Telephone Encounter (Signed)
Wrong number.

## 2013-10-08 NOTE — Patient Instructions (Signed)
Taught Alexandra Gibneyorilda Aguirre how to perform BSE and gave educational materials to take home. Patient did not need a Pap smear today due to last Pap smear was 09/24/2013 and abnormal. [facility] for a colposcopy and endometrial biopsy per recommendation. Appointment scheduled for Friday, November 01, 2013 at 0915. [facility] for diagnostic mamm[facility]. Appointment scheduled for Tuesday, October 15, 2013 at 0900. Patient aware of [facility] and will be there. Will call patient with follow up appointment at [facility]. Alexandra GibneyNorilda Aguirre verbalized understanding.  Verdelle Valtierra, Kathaleen Maserhristine Poll, RN 4:14 PM

## 2013-10-09 ENCOUNTER — Encounter (HOSPITAL_COMMUNITY): Payer: Self-pay

## 2013-10-15 ENCOUNTER — Telehealth: Payer: Self-pay | Admitting: Internal Medicine

## 2013-10-15 NOTE — Telephone Encounter (Signed)
Pt has come in today to get the results of her labs; pt is in the lobby

## 2013-10-23 ENCOUNTER — Ambulatory Visit
Admission: RE | Admit: 2013-10-23 | Discharge: 2013-10-23 | Disposition: A | Discharge status: Home / Self Care | Payer: No Typology Code available for payment source | Source: Ambulatory Visit | Attending: Obstetrics and Gynecology | Admitting: Obstetrics and Gynecology

## 2013-10-23 DIAGNOSIS — N644 Mastodynia: Secondary | ICD-10-CM

## 2013-11-01 ENCOUNTER — Other Ambulatory Visit (HOSPITAL_COMMUNITY)
Admission: RE | Admit: 2013-11-01 | Discharge: 2013-11-01 | Disposition: A | Discharge status: Home / Self Care | Payer: No Typology Code available for payment source | Source: Ambulatory Visit | Attending: Obstetrics & Gynecology | Admitting: Obstetrics & Gynecology

## 2013-11-01 ENCOUNTER — Encounter: Payer: Self-pay | Admitting: Obstetrics & Gynecology

## 2013-11-01 ENCOUNTER — Ambulatory Visit (INDEPENDENT_AMBULATORY_CARE_PROVIDER_SITE_OTHER): Payer: Self-pay | Admitting: Obstetrics & Gynecology

## 2013-11-01 VITALS — BP 119/76 | HR 81 | Temp 98.6°F | Ht 61.0 in | Wt 186.8 lb

## 2013-11-01 DIAGNOSIS — Z01812 Encounter for preprocedural laboratory examination: Secondary | ICD-10-CM

## 2013-11-01 DIAGNOSIS — R87619 Unspecified abnormal cytological findings in specimens from cervix uteri: Secondary | ICD-10-CM

## 2013-11-01 DIAGNOSIS — Z87410 Personal history of cervical dysplasia: Secondary | ICD-10-CM | POA: Insufficient documentation

## 2013-11-01 LAB — POCT PREGNANCY, URINE: Preg Test, Ur: NEGATIVE

## 2013-11-01 NOTE — Patient Instructions (Addendum)
COLPOSCOPY POST-PROCEDURE INSTRUCTIONS  1. You may take Ibuprofen, Aleve or Tylenol for cramping if needed.  2. If Monsel's solution was used, you will have a black discharge.  3. Light bleeding is normal.  If bleeding is heavier than your period, please call.  4. Put nothing in your vagina until the bleeding or discharge stops (usually 2 or3 days).  5. We will call you within one week with biopsy results or discuss the results at your follow-up appointment if needed. Colposcopa - Cuidados posteriores (Colposcopy, Care After) Siga estas instrucciones durante las prximas semanas. Estas indicaciones le proporcionan informacin general acerca de cmo deber cuidarse despus del procedimiento. El mdico tambin podr darle instrucciones ms especficas. El tratamiento se ha planificado de acuerdo a las prcticas mdicas actuales, pero a veces se producen problemas. Comunquese con el mdico si tiene algn problema o tiene dudas despus del procedimiento. QU ESPERAR DESPUS DEL PROCEDIMIENTO  Despus del procedimiento, es tpico tener las siguientes sensaciones: Clicos. Generalmente se calman en algunos minutos. Dolor. Puede durar [facility] Hospitalhasta dos das. Aturdimiento. Si esto le ocurre, recustese durante algunos minutos. Podr tener un sangrado leve o una secrecin oscura que debe detenerse en Samosetalgunos das. Durante este tiempo deber usar un apsito sanitario. INSTRUCCIONES PARA EL CUIDADO EN EL HOGAR Evite las [address] Nerelaciones sexuales, las duchas vaginales y el uso de tampones durante [address]  [address]das, o segn lo que le indique su mdico. Tome slo medicamentos de venta libre o recetados, segn las indicaciones del mdico. No tome aspirina, ya que puede causar hemorragias. Si utiliza pldoras anticonceptivas, contine tomndolas. No todos los resultados estarn disponibles durante su visita. En este caso, tenga otra entrevista con su mdico para conocerlos. No suponga que es normal si no tiene noticias de su  mdico o del establecimiento de salud. Es Copyimportante el seguimiento de todos los Ant[place]resultados de [place]los estudios. Siga los consejos de su mdico con respecto a los [place] C[place] Springsmedicamentos, [place]actividades, visitas y [place]apanicolau de control. SOLICITE ATENCIN MDICA SI: Aparece una erupcin cutnea. Tiene problemas con los medicamentos. SOLICITE ATENCIN MDICA DE INMEDIATO SI: Tiene una hemorragia abundante o elimina cogulos. Tiene fiebre. Tiene flujo vaginal anormal. Tiene clicos que no se alivian luego de tomar analgsicos. Se siente mareada, tiene vahdos o se desmaya. Siente Physiological scientistdolor en el estmago. Document Released: 06/26/2013 [facility] LPExit[facility] Patient Information 2014 [place] Valley SpringsExitCare, MarylandLLC.

## 2013-11-01 NOTE — Progress Notes (Signed)
Patient ID: Alexandra Aguirre, female   DOB: 10-11-1975, 38 y.o.   MRN: [id number]  Chief Complaint  Patient presents with  . Colposcopy  . endometrial biopsy    HPI Alexandra Aguirre is a 38 y.o. female.  [id number] Patient's last menstrual period was 10/17/2013. [facility] referral for AGUS pap. 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 Prior cervical treatment: LLETZ.  Past Medical History  Diagnosis Date  . GERD (gastroesophageal reflux disease)   . Asthma   . Abnormal Pap smear of cervix 09/24/2013    AGUS    Past Surgical History  Procedure Laterality Date  . Cervical biopsy  w/ loop electrode excision    . Removal of cyst Right 2011    Family History  Problem Relation Age of Onset  . Cancer Mother     cervical/ovarian  . Diabetes Brother   . Diabetes Maternal Uncle   . Diabetes Maternal Uncle   . Diabetes Maternal Uncle     Social History History  Substance Use Topics  . Smoking status: Never Smoker   . Smokeless tobacco: Never Used  . Alcohol Use: No    No Known Allergies  Current Outpatient Prescriptions  Medication Sig Dispense Refill  . acetaminophen (TYLENOL) 500 MG tablet Take 500 mg by mouth daily as needed for moderate pain.      Marland Kitchen. albuterol (PROVENTIL HFA;VENTOLIN HFA) 108 (90 BASE) MCG/ACT inhaler Inhale 2 puffs into the lungs every 6 (six) hours as needed for wheezing or shortness of breath.      . esomeprazole (NEXIUM) 40 MG capsule Take 1 capsule (40 mg total) by mouth daily.  30 capsule  3  . HYDROcodone-acetaminophen (NORCO/VICODIN) 5-325 MG per tablet Take 1-2 tablets by mouth every 4 (four) hours as needed for moderate pain.  20 tablet  0  . Multiple Vitamins-Minerals (MULTIVITAMIN PO) Take 1 tablet by mouth daily.      Marland Kitchen. PRESCRIPTION MEDICATION 1 tablet daily. [facility], not sure of name      . topiramate (TOPAMAX) 50  MG tablet Take 50 mg by mouth daily as needed (for headache).      . traMADol (ULTRAM) 50 MG tablet Take 1 tablet (50 mg total) by mouth every 8 (eight) hours as needed.  30 tablet  1  . omeprazole (PRILOSEC) 20 MG capsule Take 20 mg by mouth daily.       No current facility-administered medications for this visit.    Review of Systems Review of Systems  Blood pressure 119/76, pulse 81, temperature 98.6 F (37 C), height 5\' 1"  (1.549 m), weight 186 lb 12.8 oz (84.732 kg), last menstrual period 10/17/2013.  Physical Exam Physical Exam  Data Reviewed Pap result and surg path 2005  Assessment   AGUS Procedure Details  The risks and benefits of the procedure and Written informed consent obtained.  Speculum placed in vagina and excellent visualization of cervix achieved, cervix swabbed x 3 with acetic acid solution. Ectropion, small area of AWE at 10:00,  Specimens: ECC, Bx 10 and 600, EMbx  Patient given informed consent, signed copy in the chart, time out was performed. Appropriate time out taken. . The patient was placed in the lithotomy position and the cervix brought into view with sterile speculum.  Portio of cervix cleansed x 2 with betadine swabs.  A tenaculum was placed in the anterior lip  of the cervix.  The uterus was sounded for depth of 7 cm. A pipelle was introduced to into the uterus, suction created,  and an endometrial sample was obtained. All equipment was removed and accounted for.  The patient tolerated the procedure well.    Patient given post procedure instructions. The patient will return in 2 weeks for results.  Complications: none.     Plan    Specimens labelled and sent to Pathology. Return to discuss Pathology results in 2 weeks.      Kaevion Sinclair 11/01/2013, 10:18 AM

## 2013-11-01 NOTE — Progress Notes (Signed)
Here  For endometrial biopsy and colpscopy. C/o dizziness when she bends over and raises back up- encouraged to discuss with her primary care provider. Also c/o having periods twice a month for 3 months.

## 2013-11-13 NOTE — Progress Notes (Unsigned)
   Subjective:    Patient ID: Alexandra Aguirre, female    DOB: [birth year], 38 y.o.   MRN: [id number]  HPI Pt here for f/u on gerd and headaches.   gerd - no n/v. Stable on ppi.  Migraines - does well on topamax as preventititve but doesn't have a medicine that she can afford for abortive. Headaches have not changed in years. No new sx.    Review of Systems A 12 point review of systems is negative except as per hpi.       Objective:   Physical Exam  Nursing note and vitals reviewed. Constitutional: She is oriented to person, place, and time. She appears well-developed and well-nourished.  HENT:  Right Ear: External ear normal.  Left Ear: External ear normal.  Nose: Nose normal.  Mouth/Throat: Oropharynx is clear and moist. No oropharyngeal exudate.  Eyes: Conjunctivae are normal. Pupils are equal, round, and reactive to light.  Neck: Normal range of motion. Neck supple. No thyromegaly present.  Cardiovascular: Normal rate, regular rhythm and normal heart sounds.   Pulmonary/Chest: Effort normal and breath sounds normal.  Abdominal: Soft. Bowel sounds are normal. She exhibits no distension. There is no tenderness. There is no rebound.  Lymphadenopathy:    She has no cervical adenopathy.  Neurological: She is alert and oriented to person, place, and time. She has normal reflexes. cranial nerves intact grossly. Full neuro exam including finger nose and rhomberg enl. Skin: Skin is warm and dry.  Psychiatric: She has a normal mood and affect. Her behavior is normal.        Assessment & Plan:  Donzetta Mattersorilda was seen today for establish care, asthma, gastrophageal reflux and headache.  Diagnoses and associated orders for this visit:  Unspecified gastritis and gastroduodenitis without mention of hemorrhage - : omeprazole (PRILOSEC) 20 MG capsule; Take 1 capsule (20 mg total) by mouth daily.  Migraine headache - topiramate (TOPAMAX) 50 MG tablet; 1/2 - 1 tab by mouth in the evening for  headache prevention -  butalbital-acetaminophen-caffeine (FIORICET) 50-325-40 MG per tablet; 1-2 tabs by mouth up to every 4 hours as needed for headache. Max of 6 tabs in 24 hours.  Need for prophylactic vaccination and inoculation against influenza

## 2013-11-25 ENCOUNTER — Encounter: Payer: Self-pay | Admitting: Obstetrics & Gynecology

## 2013-11-25 ENCOUNTER — Telehealth: Payer: Self-pay | Admitting: Internal Medicine

## 2013-11-25 ENCOUNTER — Ambulatory Visit (INDEPENDENT_AMBULATORY_CARE_PROVIDER_SITE_OTHER): Payer: No Typology Code available for payment source | Admitting: Obstetrics & Gynecology

## 2013-11-25 VITALS — BP 112/69 | HR 67 | Temp 97.8°F | Wt 187.2 lb

## 2013-11-25 DIAGNOSIS — R42 Dizziness and giddiness: Secondary | ICD-10-CM

## 2013-11-25 MED ORDER — MECLIZINE HCL 32 MG PO TABS: 32.0000 mg | ORAL_TABLET | Freq: Three times a day (TID) | ORAL | Status: DC | PRN

## 2013-11-25 NOTE — Progress Notes (Signed)
Patient ID: Alexandra Aguirre, female   DOB: Apr 06, 1976, 38 y.o.   MRN: [id number] [facility]. Also reports vertigo.  Bx all benign, no dysplasia. Will do pap with cotesting in 12 and 24 months. Rx meclizine 32 mg q 6 hr prn vertigo  Adam PhenixJames G Arnold, MD 11/25/2013

## 2013-11-25 NOTE — Patient Instructions (Signed)
Informe de Papanicolau anormal  (Abnormal Pap Test Information) En el Papanicolaou se estudian las clulas de la superficie del cuello del tero para observar si son normales, anormales o si muestran signos de Building services engineeralteraciones debido a un tipo de virus llamado virus del Geneticist, molecularpapiloma humano o VPH. Se llama displasia a las clulas cervicales afectadas por el VPH. La displasia no es cncer, pero indica que hay clulas anormales que se encuentran en la superficie del cuello uterino. Segn el grado de displasia, algunas de las clulas puede ser consideradas pre-cancerosas y pueden convertirse en cncer con el tiempo, si se retrasa la atencin mdica.  QU SIGNIFICA QUE UN PAPANICOAU ES ANORMAL?  Tener una prueba de Papanicolaou anormal no significa que tiene cncer. Sin embargo, ciertos tipos de Papanicolaou anormales pueden ser signo de un riesgo ms alto de Geophysical data processordesarrollar cncer. El mdico le ordenar otras pruebas para investigar ms sobre las clulas anormales. Un resultado anormal de la prueba de Papanicolaou puede mostrar:   Cambios pequeos e inespecficos que deben controlarse cuidadosamente.   Displasia cervical que ha provocado cambios leves y que puede controlarse a travs del Sanduskytiempo.   Displasia cervical ms grave que debe seguirse y tratarse para estar seguros de que el problema desaparece.  Cncer.  Cuando se detecta una displasia cervical grave y se trata a tiempo, rara vez llega a ser cncer.  QU DEBO HACER LUEGO DE UN PAPANICOLAU ANORMAL?   Puede ser necesario realizar una colposcopia. Este es un procedimiento en el que se examina el cuello del tero con iluminacin y Set designermagnificacin.  Podrn extraerle Carlynn Heralduna pequea muestra de tejido del cuello uterino (biopsia) para ser [place] Saharaanalizada. Esto generalmente se realiza si hay reas que parecen infectadas.  La muestra de clulas del canal cervical se toma con un cepillo pequeo o un instrumento de raspado (cureta). Segn los Norfolk Southernresultados de los  procedimientos anteriores, Oregonel mdico podr indicar un tratamiento con crioterapia en el cuello uterino o un LEEP quirrgico, en el que se extirpa una porcin del cuello uterino. LEEP son las siglas en ingls de "procedimiento de escisin electroquirrgica con asa". En raras ocasiones, el mdico podr indicar una biopsia en cono. Este es un procedimiento en el que se extrae una pequea muestra en forma de cono del cuello del tero. Se extrae en el rea en la que se encuentran las clulas.   QU DEBO HACER SI TENGO DISPLASIA O CNCER?  La derivarn a Music therapistun especialista. La radioterapia es un tratamiento para el cncer en los casos ms avanzados. La histerectoma es la ltima opcin de tratamiento para la displasia, pero es un tratamiento ms frecuente para Management consultantel cncer. El mdico comentar con usted todas las opciones de [place]moretratamiento.  QU DEBE HACER DESPUS DEL TRATAMIENTO?  Si su Papanicolaou no es normal, debe continuar con las pruebas y controles habituales segn las indicaciones de su mdico. Su problema cervical se vigilar cuidadosamente para que no empeore. Adems, el mdico controlar y tratar los nuevos problemas que puedan surgir.  Document Released: 11/28/2011 Document Revised: 12/31/2012 [facility]ExitCare Patient Information 2014 [place]ownExitCare, MarylandLLC.

## 2013-11-25 NOTE — Telephone Encounter (Signed)
Message will be sent to Dr. Hyman HopesJegede

## 2013-11-25 NOTE — Telephone Encounter (Signed)
Pt came in to let Dr.Jegede know that she has received her results from the GYN. Pt was instructed to let the doctor know.

## 2013-11-26 ENCOUNTER — Telehealth: Payer: Self-pay | Admitting: *Deleted

## 2013-11-26 DIAGNOSIS — R42 Dizziness and giddiness: Secondary | ICD-10-CM

## 2013-11-26 MED ORDER — MECLIZINE HCL 25 MG PO TABS: 25.0000 mg | ORAL_TABLET | Freq: Three times a day (TID) | ORAL | Status: DC | PRN

## 2013-11-26 NOTE — Telephone Encounter (Signed)
[facility], Meclizine not available in ordered amount, change needed.  Dr. Jolayne Pantheronstant approved change to 25 mg. Verbal order given to pharmacy.

## 2013-12-09 ENCOUNTER — Encounter: Payer: Self-pay | Admitting: *Deleted

## 2013-12-30 ENCOUNTER — Ambulatory Visit: Payer: No Typology Code available for payment source | Admitting: Internal Medicine

## 2014-01-14 ENCOUNTER — Encounter: Payer: Self-pay | Admitting: Family Medicine

## 2014-01-14 ENCOUNTER — Ambulatory Visit: Payer: No Typology Code available for payment source | Attending: Family Medicine | Admitting: Family Medicine

## 2014-01-14 VITALS — BP 122/79 | HR 86 | Temp 97.8°F | Ht 61.0 in | Wt 188.2 lb

## 2014-01-14 DIAGNOSIS — J45901 Unspecified asthma with (acute) exacerbation: Secondary | ICD-10-CM

## 2014-01-14 DIAGNOSIS — F17200 Nicotine dependence, unspecified, uncomplicated: Secondary | ICD-10-CM | POA: Insufficient documentation

## 2014-01-14 DIAGNOSIS — J45909 Unspecified asthma, uncomplicated: Secondary | ICD-10-CM | POA: Insufficient documentation

## 2014-01-14 DIAGNOSIS — J309 Allergic rhinitis, unspecified: Secondary | ICD-10-CM | POA: Insufficient documentation

## 2014-01-14 MED ORDER — ALBUTEROL SULFATE HFA 108 (90 BASE) MCG/ACT IN AERS: 2.0000 | INHALATION_SPRAY | Freq: Four times a day (QID) | RESPIRATORY_TRACT | Status: DC | PRN

## 2014-01-14 MED ORDER — BECLOMETHASONE DIPROPIONATE 40 MCG/ACT IN AERS: 2.0000 | INHALATION_SPRAY | Freq: Two times a day (BID) | RESPIRATORY_TRACT | Status: DC

## 2014-01-14 MED ORDER — PREDNISONE 20 MG PO TABS
20.0000 mg | ORAL_TABLET | Freq: Two times a day (BID) | ORAL | Status: DC
Start: 2014-01-14 — End: 2015-11-07

## 2014-01-14 NOTE — Progress Notes (Addendum)
   Subjective:    Patient ID: Alexandra Aguirre, female    DOB: 06-13-1976, 38 y.o.   MRN: [id number]  Nurse Note: Patient presents today for asthma which has been increased by seasonal allergies. Patient has been using her inhaler more. Has tried Claratin, Benadryl, and OTC allergy medication with no relief. Patient is not in any breathing distress and no fever.    HPI  Asthma Worsening over last 2 weeks thinks due to allergies.  Wheezing most of the time with increased cough.  Shortness of breath with exertion.  No fever or sputum or chest pain.  Using albuterol many times a day.  Review of Symptoms - see HPI  PMH - Smoking status noted.     Review of Systems      Objective:   Physical Exam  Alert no acute distress conversational Heart - Regular rate and rhythm.  No murmurs, gallops or rubs.    Lungs:  diffuse wheezing each side with prolonged expiratory phase.  No dullness Extrem - no edema Skin no rash .      Assessment & Plan:

## 2014-01-14 NOTE — Patient Instructions (Addendum)
Thank you for coming in today.   We will prednisone 1 tablet 2 times a day for 5 days.  We will start you on beclomethasone 2 puffs 2 times daily. Please take this even if your breathing is fine, this will help decrease breathing issues.   Albuterol is taken as needed.   You should be better in 1 week.      Come back if breathing is not better, if symptoms are severe you should go to ER.

## 2014-01-15 DIAGNOSIS — J45909 Unspecified asthma, uncomplicated: Secondary | ICD-10-CM | POA: Insufficient documentation

## 2014-01-15 NOTE — Assessment & Plan Note (Signed)
Exacerbation without hypoxia.  No signs of pneumonia  Treat with oral steroid for 5 days and start inhaled.  Pharmacist gave asthma teaching.

## 2014-01-30 ENCOUNTER — Encounter: Payer: Self-pay | Admitting: Internal Medicine

## 2014-01-30 ENCOUNTER — Ambulatory Visit: Payer: No Typology Code available for payment source | Attending: Internal Medicine | Admitting: Internal Medicine

## 2014-01-30 VITALS — BP 115/82 | HR 90 | Temp 98.9°F | Resp 18

## 2014-01-30 DIAGNOSIS — N946 Dysmenorrhea, unspecified: Secondary | ICD-10-CM | POA: Insufficient documentation

## 2014-01-30 DIAGNOSIS — N921 Excessive and frequent menstruation with irregular cycle: Secondary | ICD-10-CM

## 2014-01-30 DIAGNOSIS — M549 Dorsalgia, unspecified: Secondary | ICD-10-CM

## 2014-01-30 DIAGNOSIS — IMO0002 Reserved for concepts with insufficient information to code with codable children: Secondary | ICD-10-CM | POA: Insufficient documentation

## 2014-01-30 DIAGNOSIS — J45909 Unspecified asthma, uncomplicated: Secondary | ICD-10-CM | POA: Insufficient documentation

## 2014-01-30 DIAGNOSIS — Z79899 Other long term (current) drug therapy: Secondary | ICD-10-CM | POA: Insufficient documentation

## 2014-01-30 DIAGNOSIS — N390 Urinary tract infection, site not specified: Secondary | ICD-10-CM | POA: Insufficient documentation

## 2014-01-30 DIAGNOSIS — N920 Excessive and frequent menstruation with regular cycle: Secondary | ICD-10-CM | POA: Insufficient documentation

## 2014-01-30 DIAGNOSIS — N923 Ovulation bleeding: Secondary | ICD-10-CM | POA: Insufficient documentation

## 2014-01-30 DIAGNOSIS — N898 Other specified noninflammatory disorders of vagina: Secondary | ICD-10-CM | POA: Insufficient documentation

## 2014-01-30 DIAGNOSIS — K219 Gastro-esophageal reflux disease without esophagitis: Secondary | ICD-10-CM | POA: Insufficient documentation

## 2014-01-30 LAB — POCT URINALYSIS DIPSTICK
Glucose, UA: NEGATIVE
Ketones, UA: NEGATIVE
Leukocytes, UA: NEGATIVE
Nitrite, UA: NEGATIVE
PH UA: 5.5
Protein, UA: 100
Spec Grav, UA: >=1.03
UROBILINOGEN UA: 0.2

## 2014-01-30 LAB — POCT URINE PREGNANCY: PREG TEST UR: NEGATIVE

## 2014-01-30 MED ORDER — NAPROXEN 500 MG PO TABS: 500.0000 mg | ORAL_TABLET | Freq: Two times a day (BID) | ORAL | Status: DC

## 2014-01-30 MED ORDER — KETOROLAC TROMETHAMINE 30 MG/ML IJ SOLN
30.0000 mg | Freq: Once | INTRAMUSCULAR | Status: AC
Start: 2014-01-30 — End: 2014-01-30
  Administered 2014-01-30: 30 mg via INTRAMUSCULAR

## 2014-01-30 NOTE — Progress Notes (Signed)
Patient here today complaining of feeling dizzy and having headaches on 01/29/2014. Patient reports she is having pain in her stomach and back. Patient reports she is using 2 pads every hour, having blurred vision, chills, and burning with urination.

## 2014-01-30 NOTE — Progress Notes (Signed)
Patient ID: Alexandra Aguirre, female   DOB: [birth year], 38 y.o.   MRN: [id number]   Alexandra Aguirre, is a 38 y.o. female  JYN:[id number]  [id number]  DOB - [birth year]  Chief Complaint  Patient presents with  . Abdominal Pain        Subjective:   Alexandra Aguirre is a 38 y.o. female here today for a follow up visit. The patient was seen as a walk-in, major complaint is severe low abdominal pain mostly in the suprapubic area. Her menstrual period started yesterday with heavy bleeding and since then has been having this pain. Bleeding is so much that she has to change her pad every hour. She does not know if she has uterine fibroid. This is one of the most severe pain she has had with menstrual period. She is also nauseated but no vomiting. The pain radiates to her back Patient has No headache, No chest pain, No new weakness tingling or numbness, No Cough - SOB.  Problem  Uti (Urinary Tract Infection)  Vaginal Bleeding Between Periods  Dysmenorrhea  Menorrhagia    ALLERGIES: No Known Allergies  PAST MEDICAL HISTORY: Past Medical History  Diagnosis Date  . GERD (gastroesophageal reflux disease)   . Asthma   . Abnormal Pap smear of cervix 09/24/2013    AGUS    MEDICATIONS AT HOME: Prior to Admission medications   Medication Sig Start Date End Date Taking? Authorizing Provider  acetaminophen (TYLENOL) 500 MG tablet Take 500 mg by mouth daily as needed for moderate pain.    Historical Provider, MD  albuterol (PROVENTIL HFA;VENTOLIN HFA) 108 (90 BASE) MCG/ACT inhaler Inhale 2 puffs into the lungs every 6 (six) hours as needed for wheezing or shortness of breath. 01/14/14   Carney LivingMarshall L Chambliss, MD  beclomethasone (QVAR) 40 MCG/ACT inhaler Inhale 2 puffs into the lungs 2 (two) times daily. 01/14/14   Carney LivingMarshall L Chambliss, MD  esomeprazole (NEXIUM) 40 MG capsule Take 1 capsule (40 mg total) by mouth daily. 09/30/13   Jeanann Lewandowskylugbemiga Adiya Selmer, MD  HYDROcodone-acetaminophen (NORCO/VICODIN) 5-325 MG per  tablet Take 1-2 tablets by mouth every 4 (four) hours as needed for moderate pain. 09/19/13   Angus SellerPeter S Dammen, PA-C  meclizine (ANTIVERT) 25 MG tablet Take 1 tablet (25 mg total) by mouth 3 (three) times daily as needed for dizziness. 11/26/13   Peggy Constant, MD  Multiple Vitamins-Minerals (MULTIVITAMIN PO) Take 1 tablet by mouth daily.    Historical Provider, MD  naproxen (NAPROSYN) 500 MG tablet Take 1 tablet (500 mg total) by mouth 2 (two) times daily with a meal. 01/30/14   Jeanann Lewandowskylugbemiga Rivaldo Hineman, MD  omeprazole (PRILOSEC) 20 MG capsule Take 20 mg by mouth daily.    Historical Provider, MD  predniSONE (DELTASONE) 20 MG tablet Take 1 tablet (20 mg total) by mouth 2 (two) times daily with a meal. 01/14/14   Carney LivingMarshall L Chambliss, MD  PRESCRIPTION MEDICATION 1 tablet daily. Birth control pill from [facility], not sure of name    Historical Provider, MD  topiramate (TOPAMAX) 50 MG tablet Take 50 mg by mouth daily as needed (for headache).    Historical Provider, MD  traMADol (ULTRAM) 50 MG tablet Take 1 tablet (50 mg total) by mouth every 8 (eight) hours as needed. 09/30/13   Jeanann Lewandowskylugbemiga Hanin Decook, MD     Objective:   Filed Vitals:   01/30/14 1645 01/30/14 1646  BP: 140/83 115/82  Pulse: 90 90  Temp: 98.9 F (37.2 C)   TempSrc: Oral   Resp:  18   SpO2: 99%     Exam General appearance : Awake, alert, not in any distress. Speech Clear. Not toxic looking, obese HEENT: Atraumatic and Normocephalic, pupils equally reactive to light and accomodation Neck: supple, no JVD. No cervical lymphadenopathy.  Chest:Good air entry bilaterally, no added sounds  CVS: S1 S2 regular, no murmurs.  Abdomen: suprapubic tenderness, Bowel sounds present, Non tender and not distended with no gaurding, rigidity or rebound. Extremities: B/L Lower Ext shows no edema, both legs are warm to touch Neurology: Awake alert, and oriented X 3, CN II-XII intact, Non focal Pelvic Examination deferred  Data Review Lab Results    Component Value Date   HGBA1C 5.9 09/30/2013   HGBA1C 5.8 10/15/2009     Assessment & Plan   1. Vaginal bleeding between periods  - POCT urine pregnancy  2. UTI (urinary tract infection)  - Urinalysis Dipstick  3. Back pain - ketorolac (TORADOL) 30 MG/ML injection 30 mg; Inject 1 mL (30 mg total) into the muscle once.  4. Dysmenorrhea  - US Pelvis Complete; Future - US Transvaginal Non-OB; Future  - naproxen (NAPROSYN) 500 MG tablet; Take 1 tablet (500 mg total) by mouth 2 (two) times daily with a meal.  Dispense: 30 tablet; Refill: 0  5. Menorrhagia  - US Pelvis Complete; Future - US Transvaginal Non-OB; Future   Patient was counseled extensively about nutrition and exercise  Return in about 6 months (around 08/02/2014), or if symptoms worsen or fail to improve, for Follow up Pain and comorbidities.  The patient was given clear instructions to go to ER or return to medical center if symptoms don't improve, worsen or new problems develop. The patient verbalized understanding. The patient was told to call to get lab results if they haven't heard anything in the next week.   This note has been created with Education officer, environmentalDragon speech recognition software and smart phrase technology. Any transcriptional errors are unintentional.    Jeanann Lewandowskylugbemiga Sequoyah Ramone, MD, MHA, FACP, [facility]sptlFAAP [facility] [place] , [place] [phone]   01/30/2014, 5:11 PM

## 2014-01-30 NOTE — Patient Instructions (Signed)

## 2014-01-31 ENCOUNTER — Telehealth: Payer: Self-pay | Admitting: Internal Medicine

## 2014-01-31 NOTE — Telephone Encounter (Signed)
Pt called regarding an appointment that was suppose to be made by the nurse for an ultrasound, please contact pt with spanish  interperter

## 2014-02-04 ENCOUNTER — Ambulatory Visit (HOSPITAL_COMMUNITY)
Admission: RE | Admit: 2014-02-04 | Discharge: 2014-02-04 | Disposition: A | Discharge status: Home / Self Care | Payer: No Typology Code available for payment source | Source: Ambulatory Visit | Attending: Internal Medicine | Admitting: Internal Medicine

## 2014-02-04 DIAGNOSIS — N946 Dysmenorrhea, unspecified: Secondary | ICD-10-CM

## 2014-02-04 DIAGNOSIS — N854 Malposition of uterus: Secondary | ICD-10-CM | POA: Insufficient documentation

## 2014-02-04 DIAGNOSIS — N920 Excessive and frequent menstruation with regular cycle: Secondary | ICD-10-CM

## 2014-02-05 ENCOUNTER — Telehealth: Payer: Self-pay | Admitting: *Deleted

## 2014-02-05 ENCOUNTER — Telehealth: Payer: Self-pay | Admitting: Emergency Medicine

## 2014-02-05 NOTE — Telephone Encounter (Signed)
Left message for pt to call clinic when message received 

## 2014-02-05 NOTE — Telephone Encounter (Signed)
Message copied by Raynelle CharyWINFREE, Batina Dougan R on Wed Feb 05, 2014 11:23 AM ------      Message from: Quentin AngstJEGEDE, OLUGBEMIGA E      Created: Tue Feb 04, 2014  2:01 PM       Please inform patient that her ultrasound is normal. His bleeding and pain continues, we will need to send her to a gynecologist for further evaluation and treatment ------

## 2014-02-05 NOTE — Telephone Encounter (Signed)
Left message

## 2014-02-05 NOTE — Telephone Encounter (Signed)
Message copied by Raynelle CharyWINFREE, Ichelle Harral R on Wed Feb 05, 2014 10:48 AM ------      Message from: Quentin AngstJEGEDE, OLUGBEMIGA E      Created: Tue Feb 04, 2014  2:01 PM       Please inform patient that her ultrasound is normal. His bleeding and pain continues, we will need to send her to a gynecologist for further evaluation and treatment ------

## 2014-02-05 NOTE — Telephone Encounter (Signed)
Message copied by Darlis LoanSMITH, JILL D on Wed Feb 05, 2014  3:34 PM ------      Message from: Quentin AngstJEGEDE, OLUGBEMIGA E      Created: Tue Feb 04, 2014  2:01 PM       Please inform patient that her ultrasound is normal. His bleeding and pain continues, we will need to send her to a gynecologist for further evaluation and treatment ------

## 2014-02-06 ENCOUNTER — Telehealth: Payer: Self-pay | Admitting: Emergency Medicine

## 2014-02-06 ENCOUNTER — Telehealth: Payer: Self-pay | Admitting: Internal Medicine

## 2014-02-06 NOTE — Telephone Encounter (Signed)
Left message per pacific interpretor line for pt to call for image results

## 2014-02-06 NOTE — Telephone Encounter (Signed)
Pt returning call regarding results, needs spanish interpreter. Please f/u with pt.

## 2014-02-14 ENCOUNTER — Telehealth: Payer: Self-pay | Admitting: Emergency Medicine

## 2014-02-14 NOTE — Telephone Encounter (Signed)
Left message with husband for pt to return call for confirmation appt scheduled 02/18/14 @930  am with Dr. Orpah Cobb

## 2014-02-18 ENCOUNTER — Other Ambulatory Visit: Payer: Self-pay | Admitting: Obstetrics & Gynecology

## 2014-02-18 ENCOUNTER — Ambulatory Visit: Payer: No Typology Code available for payment source | Attending: Internal Medicine | Admitting: Internal Medicine

## 2014-02-18 ENCOUNTER — Encounter: Payer: Self-pay | Admitting: Internal Medicine

## 2014-02-18 VITALS — BP 116/68 | HR 85 | Temp 99.1°F | Resp 16 | Wt 190.8 lb

## 2014-02-18 DIAGNOSIS — E559 Vitamin D deficiency, unspecified: Secondary | ICD-10-CM

## 2014-02-18 DIAGNOSIS — R7301 Impaired fasting glucose: Secondary | ICD-10-CM

## 2014-02-18 DIAGNOSIS — Z006 Encounter for examination for normal comparison and control in clinical research program: Secondary | ICD-10-CM | POA: Insufficient documentation

## 2014-02-18 DIAGNOSIS — G43909 Migraine, unspecified, not intractable, without status migrainosus: Secondary | ICD-10-CM

## 2014-02-18 DIAGNOSIS — Z79899 Other long term (current) drug therapy: Secondary | ICD-10-CM | POA: Insufficient documentation

## 2014-02-18 DIAGNOSIS — J45909 Unspecified asthma, uncomplicated: Secondary | ICD-10-CM | POA: Insufficient documentation

## 2014-02-18 DIAGNOSIS — E669 Obesity, unspecified: Secondary | ICD-10-CM | POA: Insufficient documentation

## 2014-02-18 DIAGNOSIS — Z833 Family history of diabetes mellitus: Secondary | ICD-10-CM | POA: Insufficient documentation

## 2014-02-18 DIAGNOSIS — K219 Gastro-esophageal reflux disease without esophagitis: Secondary | ICD-10-CM | POA: Insufficient documentation

## 2014-02-18 DIAGNOSIS — Z87898 Personal history of other specified conditions: Secondary | ICD-10-CM | POA: Insufficient documentation

## 2014-02-18 DIAGNOSIS — N920 Excessive and frequent menstruation with regular cycle: Secondary | ICD-10-CM

## 2014-02-18 LAB — COMPLETE METABOLIC PANEL WITH GFR
ALBUMIN: 3.7 g/dL (ref 3.5–5.2)
ALK PHOS: 90 U/L (ref 39–117)
ALT: 21 U/L (ref 0–35)
AST: 18 U/L (ref 0–37)
BUN: 8 mg/dL (ref 6–23)
CO2: 25 meq/L (ref 19–32)
Calcium: 8.8 mg/dL (ref 8.4–10.5)
Chloride: 101 mEq/L (ref 96–112)
Creat: 0.49 mg/dL — ABNORMAL LOW (ref 0.50–1.10)
GFR, Est African American: >89 mL/min
GLUCOSE: 117 mg/dL — AB (ref 70–99)
POTASSIUM: 4.1 meq/L (ref 3.5–5.3)
Sodium: 134 mEq/L — ABNORMAL LOW (ref 135–145)
Total Bilirubin: 0.3 mg/dL (ref 0.2–1.2)
Total Protein: 6.8 g/dL (ref 6.0–8.3)

## 2014-02-18 LAB — CBC WITH DIFFERENTIAL/PLATELET
BASOS ABS: 0.1 10*3/uL (ref 0.0–0.1)
Basophils Relative: 1 % (ref 0–1)
EOS PCT: 4 % (ref 0–5)
Eosinophils Absolute: 0.3 10*3/uL (ref 0.0–0.7)
HCT: 36.2 % (ref 36.0–46.0)
Hemoglobin: 12.2 g/dL (ref 12.0–15.0)
LYMPHS PCT: 30 % (ref 12–46)
Lymphs Abs: 2.5 10*3/uL (ref 0.7–4.0)
MCH: 29 pg (ref 26.0–34.0)
MCHC: 33.7 g/dL (ref 30.0–36.0)
MCV: 86.2 fL (ref 78.0–100.0)
Monocytes Absolute: 0.6 10*3/uL (ref 0.1–1.0)
Monocytes Relative: 7 % (ref 3–12)
NEUTROS ABS: 4.9 10*3/uL (ref 1.7–7.7)
Neutrophils Relative %: 58 % (ref 43–77)
Platelets: 448 10*3/uL — ABNORMAL HIGH (ref 150–400)
RBC: 4.2 MIL/uL (ref 3.87–5.11)
RDW: 13.8 % (ref 11.5–15.5)
WBC: 8.4 10*3/uL (ref 4.0–10.5)

## 2014-02-18 NOTE — Progress Notes (Signed)
MRN: [id number] Name: Alexandra Aguirre  Sex: female Age: 38 y.o. DOB: [birth year]  Allergies: Review of patient's allergies indicates no known allergies.  Chief Complaint  Patient presents with  . Follow-up    HPI: Patient is 38 y.o. female who has is she of migraine headaches, menorrhagia, impaired fasting glucose comes today for followup, as per patient her symptoms are stable and is taking medications regularly, her pelvic pain is improved, had ultrasound done which was negative denies any more pain, she wants to have a blood work done to check for diabetes because she has family history of diabetes, her previous blood work reviewed noticed hemoglobin A1c was 5.9% in January, she's advised for low carbohydrate diet, has history of menorrhagia which as per patient is now improved.  Past Medical History  Diagnosis Date  . GERD (gastroesophageal reflux disease)   . Asthma   . Abnormal Pap smear of cervix 09/24/2013    AGUS    Past Surgical History  Procedure Laterality Date  . Cervical biopsy  w/ loop electrode excision    . Removal of cyst Right 2011      Medication List       This list is accurate as of: 02/18/14 10:02 AM.  Always use your most recent med list.               acetaminophen 500 MG tablet  Commonly known as:  TYLENOL  Take 500 mg by mouth daily as needed for moderate pain.     albuterol 108 (90 BASE) MCG/ACT inhaler  Commonly known as:  PROVENTIL HFA;VENTOLIN HFA  Inhale 2 puffs into the lungs every 6 (six) hours as needed for wheezing or shortness of breath.     beclomethasone 40 MCG/ACT inhaler  Commonly known as:  QVAR  Inhale 2 puffs into the lungs 2 (two) times daily.     esomeprazole 40 MG capsule  Commonly known as:  NEXIUM  Take 1 capsule (40 mg total) by mouth daily.     HYDROcodone-acetaminophen 5-325 MG per tablet  Commonly known as:  NORCO/VICODIN  Take 1-2 tablets by mouth every 4 (four) hours as needed for moderate pain.     meclizine 25 MG tablet  Commonly known as:  ANTIVERT  Take 1 tablet (25 mg total) by mouth 3 (three) times daily as needed for dizziness.     MULTIVITAMIN PO  Take 1 tablet by mouth daily.     naproxen 500 MG tablet  Commonly known as:  NAPROSYN  Take 1 tablet (500 mg total) by mouth 2 (two) times daily with a meal.     omeprazole 20 MG capsule  Commonly known as:  PRILOSEC  Take 20 mg by mouth daily.     predniSONE 20 MG tablet  Commonly known as:  DELTASONE  Take 1 tablet (20 mg total) by mouth 2 (two) times daily with a meal.     PRESCRIPTION MEDICATION  1 tablet daily. Birth control pill from [facility], not sure of name     topiramate 50 MG tablet  Commonly known as:  TOPAMAX  Take 50 mg by mouth daily as needed (for headache).     traMADol 50 MG tablet  Commonly known as:  ULTRAM  Take 1 tablet (50 mg total) by mouth every 8 (eight) hours as needed.        No orders of the defined types were placed in this encounter.    Immunization History  Administered Date(s) Administered  .  Influenza,inj,Quad PF,36+ Mos 08/29/2013  . Tdap 03/12/2013    Family History  Problem Relation Age of Onset  . Cancer Mother     cervical/ovarian  . Diabetes Brother   . Diabetes Maternal Uncle   . Diabetes Maternal Uncle   . Diabetes Maternal Uncle     History  Substance Use Topics  . Smoking status: Never Smoker   . Smokeless tobacco: Never Used  . Alcohol Use: No    Review of Systems   As noted in HPI  Filed Vitals:   02/18/14 0939  BP: 116/68  Pulse: 85  Temp: 99.1 F (37.3 C)  Resp: 16    Physical Exam  Physical Exam  Constitutional:  Obese female sitting comfortably not in acute distress  Cardiovascular: Normal rate and regular rhythm.   Pulmonary/Chest: Breath sounds normal. No respiratory distress. She has no wheezes. She has no rales.  Abdominal: Soft. There is no tenderness.  No CVA tenderness  Musculoskeletal: She exhibits no edema.     CBC    Component Value Date/Time   WBC 12.8* 01/17/2013 1814   RBC 4.57 01/17/2013 1814   HGB 13.7 01/17/2013 1814   HCT 38.1 01/17/2013 1814   PLT 399 01/17/2013 1814   MCV 83.4 01/17/2013 1814   LYMPHSABS 4.4* 01/17/2013 1814   MONOABS 1.0 01/17/2013 1814   EOSABS 0.6 01/17/2013 1814   BASOSABS 0.1 01/17/2013 1814    CMP     Component Value Date/Time   NA 134* 09/30/2013 1738   K 3.8 09/30/2013 1738   CL 100 09/30/2013 1738   CO2 26 09/30/2013 1738   GLUCOSE 154* 09/30/2013 1738   BUN 9 09/30/2013 1738   CREATININE 0.56 09/30/2013 1738   CREATININE 0.47* 01/17/2013 1814   CALCIUM 9.4 09/30/2013 1738   PROT 7.5 09/30/2013 1738   ALBUMIN 4.0 09/30/2013 1738   AST 41* 09/30/2013 1738   ALT 43* 09/30/2013 1738   ALKPHOS 129* 09/30/2013 1738   BILITOT 0.2* 09/30/2013 1738   GFRNONAA >90 01/17/2013 1814   GFRAA >90 01/17/2013 1814    No results found for this basename: chol, tri, ldl    No components found with this basename: hga1c    Lab Results  Component Value Date/Time   AST 41* 09/30/2013  5:38 PM    Assessment and Plan  History of Menorrhagia - Plan: Will repeat her CBC with Differential  Migraine headache Symptoms are stable.  Unspecified vitamin D deficiency - Plan: Will repeat her Vit D  25 hydroxy (rtn osteoporosis monitoring)  IFG (impaired fasting glucose) - Plan: Advised patient for low carbohydrate diet, COMPLETE METABOLIC PANEL WITH GFR, Hemoglobin A1c   Return in about 3 months (around 05/21/2014) for IFG.  Doris Cheadleeepak Lucely Leard, MD

## 2014-02-18 NOTE — Patient Instructions (Signed)
Gua de planeamiento de la alimentacin para diabticos (Diabetes Meal Planning Guide) La gua de planeamiento de alimentacin para diabticos es una herramienta para ayudarlo a planear sus comidas y colaciones. Es importante para las personas con diabetes controlar sus niveles de azcar. Elegir los alimentos correctos y las cantidades adecuadas durante el da le ayudar a controlar el azcar en sangre. Comer bien puede incluso ayudarlo a mejorar la presin sangunea y alcanzar o mantener un peso saludable. CUENTE LOS HIDRATOS DE CARBONO CON FACILIDAD Cuando consume hidratos de carbono, stos se transforman en azcar (glucosa). Esto a su vez aumenta el nivel de azcar en sangre. El conteo de carbohidratos puede ayudarlo a controlar este nivel para que se sienta mejor. Al planear sus alimentos con el conteo de carbohidratos, podr tener ms flexibilidad en lo que come y equilibrar la medicacin con el consumo de alimentos. El conteo de carbohidratos significa simplemente sumar la cantidad total de gramos de carbohidratos a sus comidas o colaciones. Trate de consumir la misma cantidad en cada comida. A continuacin encontrar una lista de 1 porcin o 15 gr. de carbohidratos. A continuacin se enumeran. Pregunte al mdico cuntos gramos de carbohidratos necesita comer en cada comida o colacin. Almidones y granos  1 rebanada de pan.   bollo ingls o bollo para hamburguesa o hotdog.   taza de cereal fro (sin azcar).   taza de pasta o arroz cocido.   taza de vegetales que contengan almidn (maz, papas, arvejas, porotos, calabaza).  1 omelette (6 pulgadas).   bollo.  1 waffle o panqueque (del tamao de un CD).   taza de cereales cocidos.  4 a 6 galletas saldas pequeas. *Se recomienda el consumo de granos enteros. Frutas  1 taza de frutos rojos, meln, papaya o anan sin azcar.  1 fruta fresca pequea.   banana o mango.   taza de jugo de frutas (4 onzas sin endulzar).    taza de fruta envasada en jugo natural o agua.  2 cucharadas de frutas secas.  12-15 uvas o cerezas. Leche y yogurt  1 taza de leche descremada o al 1%.  1 taza de leche de soja.  6 onzas de yogurt descremado con edulcorante sin azcar.  6 onzas de yogur descremado de soja.  6 onzas de yogur natural. Vegetales  1 taza de vegetales crudos o  de vegetales cocidos se considera cero carbohidratos o una comida "libre".  Si come 3 o ms porciones en una comida, cuntelas como 1 porcin de carbohidratos. Otros carbohidratos   onzas de chips o pretzels.   taza de helado de crema o yogur helado.   taza de helado de agua.  5 cm de torta no congelada.  1 cucharada de miel, azcar, mermelada, jalea o almbar.  2 galletitas dulces pequeas.  3 cuadrados de crackers de graham.  3 tazas de palomitas de maz.  6 crackers.  1 taza de caldo.  Cuente 1 taza de guisado u otra mezcla de alimentos como 2 porciones de carbohidratos.  Los alimentos con menos de 20 caloras por porcin deben contarse como cero carbohidratos o alimento "libre". Si lo desea compre un libro o software de computacin que enumere la cantidad de gramos de carbohidratos de los diferentes alimentos. Adems, el panel nutricional en las etiquetas de los productos que consume es una buena fuente de informacin. Le indicar el tamao de la porcin y la cantidad total de carbohidratos que consumir por cada una. Divida este nmero por 15 para obtener el nmero   de conteo de carbohidratos por porcin. Recuerde: cada porcin son 15 gramos de carbohidratos. PORCIONES La medicin de los alimentos y el tamao de las porciones lo ayudarn a controlar la cantidad exacta de comida que debe ingerir. La lista que sigue le mostrar el tamao de algunas porciones comunes.   1 onza.................4 dados apilados.  3 onzas...............Un mazo de cartas.  1 cucharadita.....La punta de un dedo pequeo.  1  cucharada........Un dedo.  2 cucharadas......Una pelota de golf.   taza...............La mitad de un puo.  1 taza................Un puo. EJEMPLO DE PLAN DE ALIMENTACIN PARA DIABTICOS: A continuacin se muestra un ejemplo de plan de alimentacin que incluye comidas de los grupos de granos y fculas, vegetales, frutas y carnes. Un nutricionista podr confeccionarle un plan individualizado para cubrir sus necesidades calricas y decirle el nmero de porciones que necesita de cada grupo. Sin embargo, podra intercambiar los alimentos que contengan carbohidratos (lcteos, cereales y frutas). Controlar la cantidad total de carbohidratos en los alimentos o colaciones es ms importante que asegurarse de incluir todos los grupos alimenticios cada vez que come.  El siguiente plan de alimentacin es un ejemplo de una dieta de 2000 caloras mediante el conteo de carbohidratos. Este plan contiene 17 porciones de carbohidratos. Desayuno  1 taza de avena (2 porciones de carbohidratos).   taza de yogur light(1 porcin de carbohidratos).  1 taza de arndanos (1 porcin de carbohidratos).   taza de almendras. Colacin  1 manzana grande (2 porciones de carbohidratos).  1 palito de queso bajo en grasa. Almuerzo  Ensalada de pechuga de pollo.  1 taza de espinacas.   taza de tomates cortados.  2 oz (60 gr) de pechuga de pollo en rebanadas.  2 cucharadas de aderezo italiano bajo en contenido graso.  12 galletas integrales (2 porciones de carbohidratos).  12 a 15 uvas (1 porcin de carbohidratos).  1 taza de leche descremada (1porcin de carbohidratos). Colacin  1 taza de zanahorias.   taza de pur de garbanzos (1 porcin de carbohidratos). Cena  3 oz (80 gr) de salmn a la parrilla.  1 taza de arroz integral (3 porciones de carbohidratos). Colacin  1  taza de brcoli al vapor (1 porcin de carbohidrato) con una cucharadita de aceite de oliva y jugo de limn.  1 taza de  budn light (2 porciones de carbohidratos). HOJA DE PLANEAMIENTO DE LA ALIMENTACIN: El dietista podr utilizar esta hoja para ayudarlo a decidir cuntas porciones y qu tipos de alimentos son los adecuados para usted.  DESAYUNO Grupo de alimentos y porciones / Alimento elegido Granos/Fculas_________________________________________________ Lcteos________________________________________________________ Vegetales ______________________________________________________ Fruta __________________________________________________________ Carnes _________________________________________________________ Grasas _________________________________________________________ ALMUERZO Grupo de alimentos y porciones / Alimento elegido Granos/Fculas___________________________________________________ Lcteos_________________________________________________________ Fruta ___________________________________________________________ Carnes __________________________________________________________ Grasas __________________________________________________________ CENA Grupo de alimentos y porciones / Alimento elegido Granos/Fculas___________________________________________________ Lcteos_________________________________________________________ Fruta ___________________________________________________________ Carnes __________________________________________________________ Grasas __________________________________________________________ COLACIN Grupo de alimentos y porciones / Alimento elegido Granos/Fculas_________________________________________________ Lcteos________________________________________________________ Vegetales ______________________________________________________ Fruta _________________________________________________________ Carnes ________________________________________________________ Grasas ________________________________________________________ TOTALES  DIARIOS Fculas_______________________________________________________ Vegetales _____________________________________________________ Fruta ________________________________________________________ Lcteos_______________________________________________________ Carnes________________________________________________________ Grasas ________________________________________________________ Document Released: 12/13/2007 Document Revised: 11/28/2011 [employer] Patient Information 2014 [employer].  

## 2014-02-18 NOTE — Progress Notes (Signed)
Patient here with interpreter Complains of still having headaches that hurt near her temples and  To the back of her neck Feels tired all the time Still having some discomfort in her upper abd Would like results of ultra sound

## 2014-02-19 ENCOUNTER — Telehealth: Payer: Self-pay

## 2014-02-19 LAB — HEMOGLOBIN A1C
Hgb A1c MFr Bld: 6.3 % — ABNORMAL HIGH (ref <5.7)
Mean Plasma Glucose: 134 mg/dL — ABNORMAL HIGH (ref <117)

## 2014-02-19 LAB — VITAMIN D 25 HYDROXY (VIT D DEFICIENCY, FRACTURES): Vit D, 25-Hydroxy: 28 ng/mL — ABNORMAL LOW (ref 30–89)

## 2014-02-19 NOTE — Telephone Encounter (Signed)
Message copied by Lestine Mount on Wed Feb 19, 2014 12:14 PM ------      Message from: Doris Cheadle      Created: Wed Feb 19, 2014 10:26 AM       Blood work reviewed noticed impaired fasting glucose, call and advise patient for low carbohydrate diet.       noticed low vitamin D, advise patient to start taking OTC 2000 units daily.       ------

## 2014-02-19 NOTE — Telephone Encounter (Signed)
Interpreter line used Patient not available Unable to leave message 

## 2014-03-01 ENCOUNTER — Emergency Department (HOSPITAL_COMMUNITY)
Admission: EM | Admit: 2014-03-01 | Discharge: 2014-03-01 | Disposition: A | Discharge status: Home / Self Care | Payer: Self-pay | Attending: Emergency Medicine | Admitting: Emergency Medicine

## 2014-03-01 ENCOUNTER — Encounter (HOSPITAL_COMMUNITY): Payer: Self-pay | Admitting: Emergency Medicine

## 2014-03-01 ENCOUNTER — Emergency Department (HOSPITAL_COMMUNITY): Payer: Self-pay

## 2014-03-01 DIAGNOSIS — M79602 Pain in left arm: Secondary | ICD-10-CM

## 2014-03-01 DIAGNOSIS — Z79899 Other long term (current) drug therapy: Secondary | ICD-10-CM | POA: Insufficient documentation

## 2014-03-01 DIAGNOSIS — Y990 Civilian activity done for income or pay: Secondary | ICD-10-CM | POA: Insufficient documentation

## 2014-03-01 DIAGNOSIS — S5010XA Contusion of unspecified forearm, initial encounter: Secondary | ICD-10-CM | POA: Insufficient documentation

## 2014-03-01 DIAGNOSIS — K219 Gastro-esophageal reflux disease without esophagitis: Secondary | ICD-10-CM | POA: Insufficient documentation

## 2014-03-01 DIAGNOSIS — M79601 Pain in right arm: Secondary | ICD-10-CM

## 2014-03-01 DIAGNOSIS — S40022A Contusion of left upper arm, initial encounter: Secondary | ICD-10-CM

## 2014-03-01 DIAGNOSIS — IMO0002 Reserved for concepts with insufficient information to code with codable children: Secondary | ICD-10-CM | POA: Insufficient documentation

## 2014-03-01 DIAGNOSIS — J45909 Unspecified asthma, uncomplicated: Secondary | ICD-10-CM | POA: Insufficient documentation

## 2014-03-01 DIAGNOSIS — Y9289 Other specified places as the place of occurrence of the external cause: Secondary | ICD-10-CM | POA: Insufficient documentation

## 2014-03-01 DIAGNOSIS — Y9389 Activity, other specified: Secondary | ICD-10-CM | POA: Insufficient documentation

## 2014-03-01 DIAGNOSIS — W230XXA Caught, crushed, jammed, or pinched between moving objects, initial encounter: Secondary | ICD-10-CM | POA: Insufficient documentation

## 2014-03-01 MED ORDER — HYDROCODONE-ACETAMINOPHEN 5-325 MG PO TABS: 1.0000 | ORAL_TABLET | ORAL | Status: DC | PRN

## 2014-03-01 MED ORDER — IBUPROFEN 400 MG PO TABS
600.0000 mg | ORAL_TABLET | Freq: Once | ORAL | Status: AC
  Administered 2014-03-01: 600 mg via ORAL
  Filled 2014-03-01 (×2): qty 1

## 2014-03-01 NOTE — ED Notes (Signed)
Declined W/C at D/C and was escorted to lobby by RN. 

## 2014-03-01 NOTE — ED Notes (Signed)
Patient taken to Xray  

## 2014-03-01 NOTE — ED Notes (Signed)
Pt was at work and injured right arm when it was slammed in between 2 carts and today she injured left arm.

## 2014-03-01 NOTE — ED Provider Notes (Signed)
CSN: [id number]     Arrival date & time 03/01/14  1227 History  This chart was scribed for non-physician practitioner, Junius FinnerErin [id number], PA-C,working with Audree CamelScott T Goldston, MD, by Karle PlumberJennifer Tensley, ED Scribe.  This patient was seen in room TR05C/TR05C and the patient's care was started at 1:40 PM.  Chief Complaint  Patient presents with  . Arm Injury   The history is provided by the patient and a relative. The history is limited by a language barrier. A language interpreter was used (pt's son used for interpreting.).   HPI Comments:  Alexandra Aguirre is a 38 y.o. female who presents to the Emergency Department complaining of worsening right arm pain that started yesterday secondary to her arm being caught between two carts. She also reports injuring her left arm today by something falling on it. Pt reports her pain is 10/10 for her left arm and 7 or 8/10 for her right arm. She reports taking a medication for pain earlier today but does not recall what it was. She denies numbness or tingling of the UE. Pt denies any allergies to any medications. Denies head injuries. Denies other concerns.   Past Medical History  Diagnosis Date  . GERD (gastroesophageal reflux disease)   . Asthma   . Abnormal Pap smear of cervix 09/24/2013    AGUS   Past Surgical History  Procedure Laterality Date  . Cervical biopsy  w/ loop electrode excision    . Removal of cyst Right 2011   Family History  Problem Relation Age of Onset  . Cancer Mother     cervical/ovarian  . Diabetes Brother   . Diabetes Maternal Uncle   . Diabetes Maternal Uncle   . Diabetes Maternal Uncle    History  Substance Use Topics  . Smoking status: Never Smoker   . Smokeless tobacco: Never Used  . Alcohol Use: No   OB History   Grav Para Term Preterm Abortions TAB SAB Ect Mult Living   5 5 5       5      Review of Systems  Musculoskeletal: Positive for myalgias.  All other systems reviewed and are negative.   Allergies  Review of  patient's allergies indicates no known allergies.  Home Medications   Prior to Admission medications   Medication Sig Start Date End Date Taking? Authorizing Provider  acetaminophen (TYLENOL) 500 MG tablet Take 500 mg by mouth daily as needed for moderate pain.    Historical Provider, MD  albuterol (PROVENTIL HFA;VENTOLIN HFA) 108 (90 BASE) MCG/ACT inhaler Inhale 2 puffs into the lungs every 6 (six) hours as needed for wheezing or shortness of breath. 01/14/14   Carney LivingMarshall L Chambliss, MD  beclomethasone (QVAR) 40 MCG/ACT inhaler Inhale 2 puffs into the lungs 2 (two) times daily. 01/14/14   Carney LivingMarshall L Chambliss, MD  esomeprazole (NEXIUM) 40 MG capsule Take 1 capsule (40 mg total) by mouth daily. 09/30/13   Jeanann Lewandowskylugbemiga Jegede, MD  HYDROcodone-acetaminophen (NORCO/VICODIN) 5-325 MG per tablet Take 1-2 tablets by mouth every 4 (four) hours as needed for moderate pain. 09/19/13   Angus SellerPeter S Dammen, PA-C  HYDROcodone-acetaminophen (NORCO/VICODIN) 5-325 MG per tablet Take 1-2 tablets by mouth every 4 (four) hours as needed for moderate pain or severe pain. 03/01/14   Junius FinnerErin O'Malley, PA-C  meclizine (ANTIVERT) 25 MG tablet Take 1 tablet (25 mg total) by mouth 3 (three) times daily as needed for dizziness. 11/26/13   Peggy Constant, MD  Multiple Vitamins-Minerals (MULTIVITAMIN PO) Take 1 tablet by  mouth daily.    Historical Provider, MD  naproxen (NAPROSYN) 500 MG tablet Take 1 tablet (500 mg total) by mouth 2 (two) times daily with a meal. 01/30/14   Jeanann Lewandowsky, MD  omeprazole (PRILOSEC) 20 MG capsule Take 20 mg by mouth daily.    Historical Provider, MD  predniSONE (DELTASONE) 20 MG tablet Take 1 tablet (20 mg total) by mouth 2 (two) times daily with a meal. 01/14/14   Carney Living, MD  PRESCRIPTION MEDICATION 1 tablet daily. Birth control pill from [facility] health, not sure of name    Historical Provider, MD  topiramate (TOPAMAX) 50 MG tablet Take 50 mg by mouth daily as needed (for headache).     Historical Provider, MD  traMADol (ULTRAM) 50 MG tablet Take 1 tablet (50 mg total) by mouth every 8 (eight) hours as needed. 09/30/13   Jeanann Lewandowsky, MD  TRAVEL SICKNESS 25 MG CHEW TAKE ONE TABLET BY MOUTH THREE TIMES DAILY AS NEEDED 02/18/14   Adam Phenix, MD   Triage Vitals: BP 106/68  Pulse 81  Temp(Src) 98.5 F (36.9 C) (Oral)  Resp 18  SpO2 97%  LMP 01/16/2014 Physical Exam  Nursing note and vitals reviewed. Constitutional: She is oriented to person, place, and time. She appears well-developed and well-nourished.  HENT:  Head: Normocephalic and atraumatic.  Eyes: EOM are normal.  Neck: Normal range of motion.  Cardiovascular: Normal rate.   Pulmonary/Chest: Effort normal.  Musculoskeletal: Normal range of motion. She exhibits edema and tenderness.  Mild to moderate edema with tenderness. Full ROM of left hand and wrist. Full ROM of the left elbow. Full ROM of right shoulder, elbow, and wrist. Tenderness to musculature of RUE. No ecchymosis or edema.  Neurological: She is alert and oriented to person, place, and time.  Sensation intact of BUE.  Skin: Skin is warm and dry.  Ecchymosis to volar aspect of left forearm. No ecchymosis of right arm.  Psychiatric: She has a normal mood and affect. Her behavior is normal.    ED Course  Procedures (including critical care time) DIAGNOSTIC STUDIES: Oxygen Saturation is 97% on RA, normal by my interpretation.   COORDINATION OF CARE: 1:44 PM- Will X-Ray left forearm. Pt verbalizes understanding and agrees to plan.  Medications  ibuprofen (ADVIL,MOTRIN) tablet 600 mg (600 mg Oral Given 03/01/14 1413)    Labs Review Labs Reviewed - No data to display  Imaging Review Dg Forearm Left  03/01/2014   CLINICAL DATA:  Forearm pain.  Smashing injury.  EXAM: LEFT FOREARM - 2 VIEW  COMPARISON:  None.  FINDINGS: There is no evidence of fracture or other focal bone lesions. Soft tissues are unremarkable.  IMPRESSION: Negative.    Electronically Signed   By: Charlett Nose M.D.   On: 03/01/2014 14:15     EKG Interpretation None      MDM   Final diagnoses:  Left arm pain  Contusion of left arm  Right arm pain    Pt is a 38yo female c/o right arm pain and left arm pain while at work yesterday and today.  Left and right arm are neurovascularly in tact with edema and ecchymosis to left forearm.  Plain films: negative for acute bony injury.  Will tx symptomatically for pain.  Rx: norco. Advised to also apply ice. Advised to f/u with PCP for recheck of symptoms if not improving. Work note provided. Return precautions provided. Pt verbalized understanding and agreement with tx plan.   I personally  performed the services described in this documentation, which was scribed in my presence. The recorded information has been reviewed and is accurate.    Junius FinnerErin O'Malley, PA-C 03/02/14 [phone]

## 2014-03-02 NOTE — ED Provider Notes (Signed)
Medical screening examination/treatment/procedure(s) were performed by non-physician practitioner and as supervising physician I was immediately available for consultation/collaboration.   EKG Interpretation None        Khaza Blansett T Yordi Krager, MD 03/02/14 0959 

## 2014-03-26 ENCOUNTER — Ambulatory Visit: Payer: Self-pay

## 2014-04-09 ENCOUNTER — Ambulatory Visit: Payer: Self-pay | Attending: Internal Medicine

## 2014-07-21 ENCOUNTER — Encounter (HOSPITAL_COMMUNITY): Payer: Self-pay | Admitting: Emergency Medicine

## 2014-08-15 ENCOUNTER — Emergency Department (HOSPITAL_COMMUNITY)
Admission: EM | Admit: 2014-08-15 | Discharge: 2014-08-15 | Disposition: A | Discharge status: Home / Self Care | Payer: Self-pay | Attending: Emergency Medicine | Admitting: Emergency Medicine

## 2014-08-15 ENCOUNTER — Emergency Department (HOSPITAL_COMMUNITY): Payer: Self-pay

## 2014-08-15 ENCOUNTER — Encounter (HOSPITAL_COMMUNITY): Payer: Self-pay | Admitting: *Deleted

## 2014-08-15 DIAGNOSIS — Z791 Long term (current) use of non-steroidal anti-inflammatories (NSAID): Secondary | ICD-10-CM | POA: Insufficient documentation

## 2014-08-15 DIAGNOSIS — J45909 Unspecified asthma, uncomplicated: Secondary | ICD-10-CM | POA: Insufficient documentation

## 2014-08-15 DIAGNOSIS — R109 Unspecified abdominal pain: Secondary | ICD-10-CM | POA: Insufficient documentation

## 2014-08-15 DIAGNOSIS — R112 Nausea with vomiting, unspecified: Secondary | ICD-10-CM | POA: Insufficient documentation

## 2014-08-15 DIAGNOSIS — Z3202 Encounter for pregnancy test, result negative: Secondary | ICD-10-CM | POA: Insufficient documentation

## 2014-08-15 DIAGNOSIS — Z8719 Personal history of other diseases of the digestive system: Secondary | ICD-10-CM | POA: Insufficient documentation

## 2014-08-15 DIAGNOSIS — Z79899 Other long term (current) drug therapy: Secondary | ICD-10-CM | POA: Insufficient documentation

## 2014-08-15 DIAGNOSIS — N939 Abnormal uterine and vaginal bleeding, unspecified: Secondary | ICD-10-CM | POA: Insufficient documentation

## 2014-08-15 DIAGNOSIS — Z7952 Long term (current) use of systemic steroids: Secondary | ICD-10-CM | POA: Insufficient documentation

## 2014-08-15 LAB — URINE MICROSCOPIC-ADD ON

## 2014-08-15 LAB — COMPREHENSIVE METABOLIC PANEL
ALBUMIN: 3.3 g/dL — AB (ref 3.5–5.2)
ALT: 24 U/L (ref 0–35)
AST: 23 U/L (ref 0–37)
Alkaline Phosphatase: 124 U/L — ABNORMAL HIGH (ref 39–117)
Anion gap: 14 (ref 5–15)
BUN: 9 mg/dL (ref 6–23)
CALCIUM: 9.1 mg/dL (ref 8.4–10.5)
CO2: 24 mEq/L (ref 19–32)
Chloride: 98 mEq/L (ref 96–112)
Creatinine, Ser: 0.59 mg/dL (ref 0.50–1.10)
GFR calc non Af Amer: >90 mL/min (ref >90)
GLUCOSE: 150 mg/dL — AB (ref 70–99)
POTASSIUM: 4.3 meq/L (ref 3.7–5.3)
Sodium: 136 mEq/L — ABNORMAL LOW (ref 137–147)
TOTAL PROTEIN: 7.4 g/dL (ref 6.0–8.3)
Total Bilirubin: <0.2 mg/dL — ABNORMAL LOW (ref 0.3–1.2)

## 2014-08-15 LAB — CBC WITH DIFFERENTIAL/PLATELET
Basophils Absolute: 0 10*3/uL (ref 0.0–0.1)
Basophils Relative: 0 % (ref 0–1)
EOS ABS: 0.3 10*3/uL (ref 0.0–0.7)
EOS PCT: 3 % (ref 0–5)
HCT: 37 % (ref 36.0–46.0)
Hemoglobin: 12.4 g/dL (ref 12.0–15.0)
LYMPHS ABS: 3.2 10*3/uL (ref 0.7–4.0)
Lymphocytes Relative: 33 % (ref 12–46)
MCH: 29.8 pg (ref 26.0–34.0)
MCHC: 33.5 g/dL (ref 30.0–36.0)
MCV: 88.9 fL (ref 78.0–100.0)
Monocytes Absolute: 0.6 10*3/uL (ref 0.1–1.0)
Monocytes Relative: 6 % (ref 3–12)
NEUTROS PCT: 58 % (ref 43–77)
Neutro Abs: 5.6 10*3/uL (ref 1.7–7.7)
PLATELETS: 355 10*3/uL (ref 150–400)
RBC: 4.16 MIL/uL (ref 3.87–5.11)
RDW: 12.9 % (ref 11.5–15.5)
WBC: 9.8 10*3/uL (ref 4.0–10.5)

## 2014-08-15 LAB — URINALYSIS, ROUTINE W REFLEX MICROSCOPIC
Bilirubin Urine: NEGATIVE
Glucose, UA: NEGATIVE mg/dL
Ketones, ur: NEGATIVE mg/dL
LEUKOCYTES UA: NEGATIVE
NITRITE: NEGATIVE
PROTEIN: NEGATIVE mg/dL
Specific Gravity, Urine: 1.013 (ref 1.005–1.030)
Urobilinogen, UA: 0.2 mg/dL (ref 0.0–1.0)
pH: 7.5 (ref 5.0–8.0)

## 2014-08-15 LAB — LIPASE, BLOOD: Lipase: 26 U/L (ref 11–59)

## 2014-08-15 LAB — WET PREP, GENITAL
Clue Cells Wet Prep HPF POC: NONE SEEN
Trich, Wet Prep: NONE SEEN
YEAST WET PREP: NONE SEEN

## 2014-08-15 LAB — PREGNANCY, URINE: PREG TEST UR: NEGATIVE

## 2014-08-15 MED ORDER — HYDROCODONE-ACETAMINOPHEN 5-325 MG PO TABS: 1.0000 | ORAL_TABLET | Freq: Four times a day (QID) | ORAL | Status: DC | PRN

## 2014-08-15 MED ORDER — SODIUM CHLORIDE 0.9 % IV BOLUS (SEPSIS)
1000.0000 mL | Freq: Once | INTRAVENOUS | Status: AC
  Administered 2014-08-15: 1000 mL via INTRAVENOUS

## 2014-08-15 MED ORDER — MORPHINE SULFATE 4 MG/ML IJ SOLN
4.0000 mg | Freq: Once | INTRAMUSCULAR | Status: AC
  Administered 2014-08-15: 4 mg via INTRAVENOUS
  Filled 2014-08-15: qty 1

## 2014-08-15 MED ORDER — IOHEXOL 300 MG/ML  SOLN
25.0000 mL | Freq: Once | INTRAMUSCULAR | Status: DC | PRN
Start: 2014-08-15 — End: 2014-08-16

## 2014-08-15 MED ORDER — IOHEXOL 300 MG/ML  SOLN
100.0000 mL | Freq: Once | INTRAMUSCULAR | Status: AC | PRN
  Administered 2014-08-15: 100 mL via INTRAVENOUS

## 2014-08-15 MED ORDER — ONDANSETRON HCL 4 MG PO TABS: 4.0000 mg | ORAL_TABLET | Freq: Four times a day (QID) | ORAL | Status: DC

## 2014-08-15 MED ORDER — ONDANSETRON HCL 4 MG/2ML IJ SOLN
4.0000 mg | Freq: Once | INTRAMUSCULAR | Status: AC
  Administered 2014-08-15: 4 mg via INTRAVENOUS
  Filled 2014-08-15: qty 2

## 2014-08-15 NOTE — ED Provider Notes (Signed)
CSN: [id number]     Arrival date & time 08/15/14  1727 History   First MD Initiated Contact with Patient 08/15/14 1825     Chief Complaint  Patient presents with  . Abdominal Pain     (Consider location/radiation/quality/duration/timing/severity/associated sxs/prior Treatment) HPI Comments: Patient presents to the emergency department with chief complaints of nausea, vomiting, headache, and some dizziness. She states that the symptoms started yesterday. She states that she has vomited multiple times today. She denies any sick contacts. She also complains of lower abdominal pain, and new vaginal bleeding. She states that she missed her period this month. She denies any fevers, chills, chest pain, shortness of breath, diarrhea, or constipation. There are no aggravating or alleviating factors.  The history is provided by the patient. A language interpreter was used.    Past Medical History  Diagnosis Date  . GERD (gastroesophageal reflux disease)   . Asthma   . Abnormal Pap smear of cervix 09/24/2013    AGUS   Past Surgical History  Procedure Laterality Date  . Cervical biopsy  w/ loop electrode excision    . Removal of cyst Right 2011   Family History  Problem Relation Age of Onset  . Cancer Mother     cervical/ovarian  . Diabetes Brother   . Diabetes Maternal Uncle   . Diabetes Maternal Uncle   . Diabetes Maternal Uncle    History  Substance Use Topics  . Smoking status: Never Smoker   . Smokeless tobacco: Never Used  . Alcohol Use: No   OB History    Gravida Para Term Preterm AB TAB SAB Ectopic Multiple Living   5 5 5       5      Review of Systems  Constitutional: Negative for fever and chills.  Respiratory: Negative for shortness of breath.   Cardiovascular: Negative for chest pain.  Gastrointestinal: Positive for nausea, vomiting and abdominal pain. Negative for diarrhea and constipation.  Genitourinary: Positive for vaginal bleeding. Negative for dysuria.  All  other systems reviewed and are negative.     Allergies  Review of patient's allergies indicates no known allergies.  Home Medications   Prior to Admission medications   Medication Sig Start Date End Date Taking? Authorizing Provider  albuterol (PROVENTIL HFA;VENTOLIN HFA) 108 (90 BASE) MCG/ACT inhaler Inhale 2 puffs into the lungs every 6 (six) hours as needed for wheezing or shortness of breath. 01/14/14  Yes Carney LivingMarshall L Chambliss, MD  beclomethasone (QVAR) 40 MCG/ACT inhaler Inhale 2 puffs into the lungs 2 (two) times daily. 01/14/14  Yes Carney LivingMarshall L Chambliss, MD  ibuprofen (ADVIL,MOTRIN) 200 MG tablet Take 400 mg by mouth every 6 (six) hours as needed for moderate pain.   Yes Historical Provider, MD  Multiple Vitamins-Minerals (MULTIVITAMIN PO) Take 1 tablet by mouth daily.   Yes Historical Provider, MD  PRESCRIPTION MEDICATION 1 tablet daily. [facility] from [facility], not sure of name   Yes Historical Provider, MD  sodium-potassium bicarbonate (ALKA-SELTZER GOLD) TBEF dissolvable tablet Take 2 tablets by mouth 2 (two) times daily as needed (for indigestion).   Yes Historical Provider, MD  HYDROcodone-acetaminophen (NORCO/VICODIN) 5-325 MG per tablet Take 1-2 tablets by mouth every 4 (four) hours as needed for moderate pain. 09/19/13   Angus SellerPeter S Dammen, PA-C  HYDROcodone-acetaminophen (NORCO/VICODIN) 5-325 MG per tablet Take 1-2 tablets by mouth every 4 (four) hours as needed for moderate pain or severe pain. 03/01/14   Junius FinnerErin O'Malley, PA-C  meclizine (ANTIVERT) 25 MG  tablet Take 1 tablet (25 mg total) by mouth 3 (three) times daily as needed for dizziness. 11/26/13   Peggy Constant, MD  naproxen (NAPROSYN) 500 MG tablet Take 1 tablet (500 mg total) by mouth 2 (two) times daily with a meal. 01/30/14   Quentin Angst, MD  predniSONE (DELTASONE) 20 MG tablet Take 1 tablet (20 mg total) by mouth 2 (two) times daily with a meal. 01/14/14   Carney Living, MD  traMADol (ULTRAM) 50  MG tablet Take 1 tablet (50 mg total) by mouth every 8 (eight) hours as needed. 09/30/13   Quentin Angst, MD  TRAVEL SICKNESS 25 MG CHEW TAKE ONE TABLET BY MOUTH THREE TIMES DAILY AS NEEDED 02/18/14   Adam Phenix, MD   BP 120/47 mmHg  Pulse 87  Temp(Src) 98.1 F (36.7 C) (Oral)  Resp 22  SpO2 99%  LMP 06/27/2014 Physical Exam  Constitutional: She is oriented to person, place, and time. She appears well-developed and well-nourished.  HENT:  Head: Normocephalic and atraumatic.  Eyes: Conjunctivae and EOM are normal. Pupils are equal, round, and reactive to light.  Neck: Normal range of motion. Neck supple.  Cardiovascular: Normal rate and regular rhythm.  Exam reveals no gallop and no friction rub.   No murmur heard. Pulmonary/Chest: Effort normal and breath sounds normal. No respiratory distress. She has no wheezes. She has no rales. She exhibits no tenderness.  Abdominal: Soft. Bowel sounds are normal. She exhibits no distension and no mass. There is tenderness. There is no rebound and no guarding.  Abdomen moderately tender to palpation in the epigastric region as well as over the urinary bladder, no right lower quadrant tenderness, no right upper quadrant tenderness  Genitourinary:  Pelvic exam chaperoned by female ER tech, no right or left adnexal tenderness, moderate uterine tenderness, no vaginal discharge, no bleeding, no CMT or friability, no foreign body, no injury to the external genitalia, no other significant findings   Musculoskeletal: Normal range of motion. She exhibits no edema or tenderness.  Neurological: She is alert and oriented to person, place, and time.  Skin: Skin is warm and dry.  Psychiatric: She has a normal mood and affect. Her behavior is normal. Judgment and thought content normal.  Nursing note and vitals reviewed.   ED Course  Procedures (including critical care time) Results for orders placed or performed during the hospital encounter of 08/15/14   Wet prep, genital  Result Value Ref Range   Yeast Wet Prep HPF POC NONE SEEN NONE SEEN   Trich, Wet Prep NONE SEEN NONE SEEN   Clue Cells Wet Prep HPF POC NONE SEEN NONE SEEN   WBC, Wet Prep HPF POC MANY (A) NONE SEEN  CBC WITH DIFFERENTIAL  Result Value Ref Range   WBC 9.8 4.0 - 10.5 K/uL   RBC 4.16 3.87 - 5.11 MIL/uL   Hemoglobin 12.4 12.0 - 15.0 g/dL   HCT 16.1 09.6 - 04.5 %   MCV 88.9 78.0 - 100.0 fL   MCH 29.8 26.0 - 34.0 pg   MCHC 33.5 30.0 - 36.0 g/dL   RDW 40.9 81.1 - 91.4 %   Platelets 355 150 - 400 K/uL   Neutrophils Relative % 58 43 - 77 %   Neutro Abs 5.6 1.7 - 7.7 K/uL   Lymphocytes Relative 33 12 - 46 %   Lymphs Abs 3.2 0.7 - 4.0 K/uL   Monocytes Relative 6 3 - 12 %   Monocytes Absolute 0.6 0.1 -  1.0 K/uL   Eosinophils Relative 3 0 - 5 %   Eosinophils Absolute 0.3 0.0 - 0.7 K/uL   Basophils Relative 0 0 - 1 %   Basophils Absolute 0.0 0.0 - 0.1 K/uL  Comprehensive metabolic panel  Result Value Ref Range   Sodium 136 (L) 137 - 147 mEq/L   Potassium 4.3 3.7 - 5.3 mEq/L   Chloride 98 96 - 112 mEq/L   CO2 24 19 - 32 mEq/L   Glucose, Bld 150 (H) 70 - 99 mg/dL   BUN 9 6 - 23 mg/dL   Creatinine, Ser 6.06 0.50 - 1.10 mg/dL   Calcium 9.1 8.4 - 30.1 mg/dL   Total Protein 7.4 6.0 - 8.3 g/dL   Albumin 3.3 (L) 3.5 - 5.2 g/dL   AST 23 0 - 37 U/L   ALT 24 0 - 35 U/L   Alkaline Phosphatase 124 (H) 39 - 117 U/L   Total Bilirubin <0.2 (L) 0.3 - 1.2 mg/dL   GFR calc non Af Amer >90 >90 mL/min   GFR calc Af Amer >90 >90 mL/min   Anion gap 14 5 - 15  Lipase, blood  Result Value Ref Range   Lipase 26 11 - 59 U/L  Pregnancy, urine  Result Value Ref Range   Preg Test, Ur NEGATIVE NEGATIVE  Urinalysis with microscopic  Result Value Ref Range   Color, Urine YELLOW YELLOW   APPearance CLEAR CLEAR   Specific Gravity, Urine 1.013 1.005 - 1.030   pH 7.5 5.0 - 8.0   Glucose, UA NEGATIVE NEGATIVE mg/dL   Hgb urine dipstick LARGE (A) NEGATIVE   Bilirubin Urine NEGATIVE  NEGATIVE   Ketones, ur NEGATIVE NEGATIVE mg/dL   Protein, ur NEGATIVE NEGATIVE mg/dL   Urobilinogen, UA 0.2 0.0 - 1.0 mg/dL   Nitrite NEGATIVE NEGATIVE   Leukocytes, UA NEGATIVE NEGATIVE  Urine microscopic-add on  Result Value Ref Range   Squamous Epithelial / LPF RARE RARE   WBC, UA 0-2 <3 WBC/hpf   RBC / HPF 21-50 <3 RBC/hpf   Bacteria, UA RARE RARE   Ct Abdomen Pelvis W Contrast  08/15/2014   CLINICAL DATA:  Lower abdominal pain.  EXAM: CT ABDOMEN AND PELVIS WITH CONTRAST  TECHNIQUE: Multidetector CT imaging of the abdomen and pelvis was performed using the standard protocol following bolus administration of intravenous contrast.  CONTRAST:  OMNIPAQUE IOHEXOL 300 MG/ML  SOLN  COMPARISON:  CT scan of October 27, 2008.  FINDINGS: Visualized lung bases appear normal. No osseous abnormality is noted.  Fatty infiltration of the liver is noted with sparing noted around the gallbladder fossa. The spleen and pancreas appear normal. No gallstones are noted. Adrenal glands and kidneys appear normal. No hydronephrosis or renal obstruction is noted. The appendix appears normal. There is no evidence of bowel obstruction. No abnormal fluid collection is noted. Urinary bladder and uterus appear normal. Ovaries appear normal. No significant adenopathy is noted.  IMPRESSION: Fatty infiltration of the liver. No other significant abnormality seen in the abdomen or pelvis.   Electronically Signed   By: Roque Lias M.D.   On: 08/15/2014 20:58     Imaging Review No results found.   EKG Interpretation None      MDM   Final diagnoses:  Abdominal pain  Abdominal pain    Patient with abdominal pain, nausea, vomiting, headache, also complaining of new vaginal bleeding. We'll check basic labs, will reassess. Will also check pelvic exam.  Pelvic exam remarkable for no  adnexal tenderness, scant amount of full blood in vaginal vault.  Abdomen is still quite tender in the upper and lower abdomen,  will order CT.  9:17 PM Patient reassessed. She states that she is feeling better. CT scan on labs are unremarkable. Suspect that lower abdominal cramping is related to patient starting her menstrual cycle. Additionally patient now endorses history of gastritis, and thinks that her upper abdominal pain and vomiting is from this. I will discharge her with some pain medicine and nausea medicine. Recommend primary care follow-up. Patient understands and agrees with the plan. She is stable and ready for discharge.   Roxy HorsemanRobert Catalina Salasar, PA-C 08/15/14 2123  Flint MelterElliott L Wentz, MD 08/15/14 [phone]

## 2014-08-15 NOTE — ED Notes (Signed)
Pt tolerated fluids well.  

## 2014-08-15 NOTE — Discharge Instructions (Signed)
Dolor abdominal °(Abdominal Pain) °El dolor puede tener muchas causas. Normalmente la causa del dolor abdominal no es una enfermedad y mejorará sin tratamiento. Frecuentemente puede controlarse y tratarse en casa. Su médico le realizará un examen físico y posiblemente solicite análisis de sangre y radiografías para ayudar a determinar la gravedad de su dolor. Sin embargo, en muchos casos, debe transcurrir más tiempo antes de que se pueda encontrar una causa evidente del dolor. Antes de llegar a ese punto, es posible que su médico no sepa si necesita más pruebas o un tratamiento más profundo. °INSTRUCCIONES PARA EL CUIDADO EN EL HOGAR  °Esté atento al dolor para ver si hay cambios. Las siguientes indicaciones ayudarán a aliviar cualquier molestia que pueda sentir: °· Tome solo medicamentos de venta libre o recetados, según las indicaciones del médico. °· No tome laxantes a menos que se lo haya indicado su médico. °· Pruebe con una dieta líquida absoluta (caldo, té o agua) según se lo indique su médico. Introduzca gradualmente una dieta normal, según su tolerancia. °SOLICITE ATENCIÓN MÉDICA SI: °· Tiene dolor abdominal sin explicación. °· Tiene dolor abdominal relacionado con náuseas o diarrea. °· Tiene dolor cuando orina o defeca. °· Experimenta dolor abdominal que lo despierta de noche. °· Tiene dolor abdominal que empeora o mejora cuando come alimentos. °· Tiene dolor abdominal que empeora cuando come alimentos grasosos. °· Tiene fiebre. °SOLICITE ATENCIÓN MÉDICA DE INMEDIATO SI:  °· El dolor no desaparece en un plazo máximo de 2 horas. °· No deja de (vomitar). °· El dolor se siente solo en partes del abdomen, como el lado derecho o la parte inferior izquierda del abdomen. °· Evacúa materia fecal sanguinolenta o negra, de aspecto alquitranado. °ASEGÚRESE DE QUE: °· Comprende estas instrucciones. °· Controlará su afección. °· Recibirá ayuda de inmediato si no mejora o si empeora. °Document Released: 09/05/2005  Document Revised: 09/10/2013 °[employer]® Patient Information ©2015 [employer]. This information is not intended to replace advice given to you by your health care provider. Make sure you discuss any questions you have with your health care provider. ° °

## 2014-08-15 NOTE — ED Notes (Signed)
Interpreter phone used.  Pt reports that beginning yesterday she had a headache with dizziness and then began vomiting later that afternoon.  Pt reports she vomited all day and is unsure of how many times.  Pt is now complaining of dizziness and diffuse abdominal pain that is rated a 10/10.  Pt is also complaining of a pressure pain in her vagina.  Pt reports that LMP was Oct. 9th.  Pregnancy is a possibility according to pt.

## 2014-08-15 NOTE — ED Notes (Signed)
Pt in c/o headache and vomiting since yesterday, today patient developed lower abd pain and pressure, states she feels like something is about to come out, reports vaginal discharge and bleeding yesterday, LMP 06/27/14, unsure if she could be pregnant

## 2014-08-16 LAB — GC/CHLAMYDIA PROBE AMP
CT Probe RNA: NEGATIVE
GC Probe RNA: NEGATIVE

## 2014-10-15 ENCOUNTER — Ambulatory Visit: Payer: Self-pay | Attending: Internal Medicine | Admitting: Physician Assistant

## 2014-10-15 ENCOUNTER — Other Ambulatory Visit: Payer: Self-pay | Admitting: Internal Medicine

## 2014-10-15 VITALS — BP 106/71 | HR 79 | Temp 97.8°F | Resp 16 | Ht 61.0 in | Wt 186.4 lb

## 2014-10-15 DIAGNOSIS — R300 Dysuria: Secondary | ICD-10-CM | POA: Insufficient documentation

## 2014-10-15 DIAGNOSIS — R109 Unspecified abdominal pain: Secondary | ICD-10-CM

## 2014-10-15 DIAGNOSIS — Z23 Encounter for immunization: Secondary | ICD-10-CM

## 2014-10-15 DIAGNOSIS — R1032 Left lower quadrant pain: Secondary | ICD-10-CM | POA: Insufficient documentation

## 2014-10-15 DIAGNOSIS — H9202 Otalgia, left ear: Secondary | ICD-10-CM | POA: Insufficient documentation

## 2014-10-15 DIAGNOSIS — Z7951 Long term (current) use of inhaled steroids: Secondary | ICD-10-CM | POA: Insufficient documentation

## 2014-10-15 LAB — POCT URINALYSIS DIPSTICK
BILIRUBIN UA: NEGATIVE
Blood, UA: NEGATIVE
Glucose, UA: NEGATIVE
Ketones, UA: NEGATIVE
Nitrite, UA: NEGATIVE
PROTEIN UA: NEGATIVE
Spec Grav, UA: 1.02
Urobilinogen, UA: 0.2
pH, UA: 6

## 2014-10-15 MED ORDER — AMOXICILLIN-POT CLAVULANATE 875-125 MG PO TABS: 1.0000 | ORAL_TABLET | Freq: Two times a day (BID) | ORAL | Status: DC

## 2014-10-15 MED ORDER — SODIUM & POTASSIUM BICARBONATE PO TBEF: 2.0000 | EFFERVESCENT_TABLET | Freq: Two times a day (BID) | ORAL | Status: DC | PRN

## 2014-10-15 NOTE — Progress Notes (Signed)
Chief Complaint: Left side pain and left ear pain  Subjective: 39 yo Hispanic female presents acutely with three issues: 1. Left lower quad pain that started last night about 9 pm. She also has had burning on urination for 2 days. No Nausea or vomiting. Positive chills. No diarrhea. She has had recent blood work and CT scan of abd that was essentially normal. 2. One week of left ear pain and throbbing. No trouble hearing. No sick contacts.  3. Refill on "gastritis" medication.    ROS:  GEN: denies fever or chills, denies change in weight Skin: denies lesions or rashes HEENT: denies headache, earache, epistaxis, sore throat, or neck pain LUNGS: denies SHOB, dyspnea, PND, orthopnea CV: denies CP or palpitations ABD: denies abd pain, N or V EXT: denies muscle spasms or swelling; no pain in lower ext, no weakness NEURO: denies numbness or tingling, denies sz, stroke or TIA   Objective:  Filed Vitals:   10/15/14 1555  BP: 106/71  Pulse: 79  Temp: 97.8 F (36.6 C)  TempSrc: Oral  Resp: 16  Height: 5\' 1"  (1.549 m)  Weight: 186 lb 6.4 oz (84.55 kg)  SpO2: 99%    Physical Exam:  General: in no acute distress. HEENT: no pallor, no icterus, moist oral mucosa, no JVD, no lymphadenopathy EAR-left erythematous, no pus; normal on right Heart: Normal  s1 &s2  Regular rate and rhythm, without murmurs, rubs, gallops. Lungs: Clear to auscultation bilaterally. Abdomen: Soft, nontender, nondistended, positive bowel sounds. No rebound tenderness.  Extremities: No clubbing cyanosis or edema with positive pedal pulses. Neuro: Alert, awake, oriented x3, nonfocal.  Pertinent Lab Results: few leukocytes on U/A   Medications: Prior to Admission medications   Medication Sig Start Date End Date Taking? Authorizing Provider  albuterol (PROVENTIL HFA;VENTOLIN HFA) 108 (90 BASE) MCG/ACT inhaler Inhale 2 puffs into the lungs every 6 (six) hours as needed for wheezing or shortness of breath.  01/14/14  Yes Carney LivingMarshall L Chambliss, MD  beclomethasone (QVAR) 40 MCG/ACT inhaler Inhale 2 puffs into the lungs 2 (two) times daily. 01/14/14  Yes Carney LivingMarshall L Chambliss, MD  ibuprofen (ADVIL,MOTRIN) 200 MG tablet Take 400 mg by mouth every 6 (six) hours as needed for moderate pain.   Yes Historical Provider, MD  Multiple Vitamins-Minerals (MULTIVITAMIN PO) Take 1 tablet by mouth daily.   Yes Historical Provider, MD  traMADol (ULTRAM) 50 MG tablet Take 1 tablet (50 mg total) by mouth every 8 (eight) hours as needed. 09/30/13  Yes Quentin Angstlugbemiga E Jegede, MD  amoxicillin-clavulanate (AUGMENTIN) 875-125 MG per tablet Take 1 tablet by mouth 2 (two) times daily. 10/15/14   Vivianne Masteriffany S Monai Hindes, PA-C  HYDROcodone-acetaminophen (NORCO/VICODIN) 5-325 MG per tablet Take 1-2 tablets by mouth every 6 (six) hours as needed for moderate pain or severe pain. Patient not taking: Reported on 10/15/2014 08/15/14   Roxy Horsemanobert Browning, PA-C  meclizine (ANTIVERT) 25 MG tablet Take 1 tablet (25 mg total) by mouth 3 (three) times daily as needed for dizziness. Patient not taking: Reported on 10/15/2014 11/26/13   Catalina AntiguaPeggy Constant, MD  naproxen (NAPROSYN) 500 MG tablet Take 1 tablet (500 mg total) by mouth 2 (two) times daily with a meal. Patient not taking: Reported on 10/15/2014 01/30/14   Quentin Angstlugbemiga E Jegede, MD  ondansetron (ZOFRAN) 4 MG tablet Take 1 tablet (4 mg total) by mouth every 6 (six) hours. Patient not taking: Reported on 10/15/2014 08/15/14   Roxy Horsemanobert Browning, PA-C  predniSONE (DELTASONE) 20 MG tablet Take 1 tablet (20 mg  total) by mouth 2 (two) times daily with a meal. Patient not taking: Reported on 10/15/2014 01/14/14   Carney Living, MD  PRESCRIPTION MEDICATION 1 tablet daily. Birth control pill from [employer] [facility], not sure of name    Historical Provider, MD  sodium-potassium bicarbonate (ALKA-SELTZER GOLD) TBEF dissolvable tablet Take 2 tablets by mouth 2 (two) times daily as needed (for indigestion). 10/15/14   Nicholai Willette Netta Cedars, PA-C  TRAVEL SICKNESS 25 MG CHEW TAKE ONE TABLET BY MOUTH THREE TIMES DAILY AS NEEDED Patient not taking: Reported on 10/15/2014 02/18/14   Adam Phenix, MD   Assessment: 1. LLQ Abdominal pain r/o UTI, r/o Diverticular dz 2. Left ear pain 3. Medication refill  Plan: Dipstick-few leukocytes Augmentin to treat both Refill Alka seltzer 1 week follow up  Follow up:1 week  The patient was given clear instructions to go to ER or return to medical center if symptoms don't improve, worsen or new problems develop. The patient verbalized understanding. The patient was told to call to get lab results if they haven't heard anything in the next week.   This note has been created with Education officer, environmental. Any transcriptional errors are unintentional.   Scot Jun, PA-C 10/15/2014, 4:33 PM

## 2014-10-15 NOTE — Progress Notes (Signed)
[facility] Inter[facility], Graciaella Patient presents for 1 day hx LLQ pain radiating to left flank; rates 10/10, burning with urination, chills Denies fever

## 2014-10-17 ENCOUNTER — Emergency Department (HOSPITAL_COMMUNITY)
Admission: EM | Admit: 2014-10-17 | Discharge: 2014-10-18 | Disposition: A | Discharge status: Home / Self Care | Payer: Self-pay | Attending: Emergency Medicine | Admitting: Emergency Medicine

## 2014-10-17 ENCOUNTER — Encounter (HOSPITAL_COMMUNITY): Payer: Self-pay

## 2014-10-17 ENCOUNTER — Emergency Department (HOSPITAL_COMMUNITY): Payer: Self-pay

## 2014-10-17 DIAGNOSIS — R1013 Epigastric pain: Secondary | ICD-10-CM | POA: Insufficient documentation

## 2014-10-17 DIAGNOSIS — R109 Unspecified abdominal pain: Secondary | ICD-10-CM

## 2014-10-17 DIAGNOSIS — R1032 Left lower quadrant pain: Secondary | ICD-10-CM | POA: Insufficient documentation

## 2014-10-17 DIAGNOSIS — J45909 Unspecified asthma, uncomplicated: Secondary | ICD-10-CM | POA: Insufficient documentation

## 2014-10-17 DIAGNOSIS — Z8669 Personal history of other diseases of the nervous system and sense organs: Secondary | ICD-10-CM | POA: Insufficient documentation

## 2014-10-17 DIAGNOSIS — Z8719 Personal history of other diseases of the digestive system: Secondary | ICD-10-CM | POA: Insufficient documentation

## 2014-10-17 DIAGNOSIS — R630 Anorexia: Secondary | ICD-10-CM | POA: Insufficient documentation

## 2014-10-17 DIAGNOSIS — Z79899 Other long term (current) drug therapy: Secondary | ICD-10-CM | POA: Insufficient documentation

## 2014-10-17 DIAGNOSIS — Z792 Long term (current) use of antibiotics: Secondary | ICD-10-CM | POA: Insufficient documentation

## 2014-10-17 DIAGNOSIS — Z7951 Long term (current) use of inhaled steroids: Secondary | ICD-10-CM | POA: Insufficient documentation

## 2014-10-17 DIAGNOSIS — Z3202 Encounter for pregnancy test, result negative: Secondary | ICD-10-CM | POA: Insufficient documentation

## 2014-10-17 LAB — COMPREHENSIVE METABOLIC PANEL
ALT: 28 U/L (ref 0–35)
AST: 31 U/L (ref 0–37)
Albumin: 3.5 g/dL (ref 3.5–5.2)
Alkaline Phosphatase: 122 U/L — ABNORMAL HIGH (ref 39–117)
Anion gap: 6 (ref 5–15)
BUN: 7 mg/dL (ref 6–23)
CHLORIDE: 102 mmol/L (ref 96–112)
CO2: 29 mmol/L (ref 19–32)
CREATININE: 0.6 mg/dL (ref 0.50–1.10)
Calcium: 8.7 mg/dL (ref 8.4–10.5)
GFR calc non Af Amer: >90 mL/min (ref >90)
Glucose, Bld: 189 mg/dL — ABNORMAL HIGH (ref 70–99)
Potassium: 3.7 mmol/L (ref 3.5–5.1)
Sodium: 137 mmol/L (ref 135–145)
TOTAL PROTEIN: 7.5 g/dL (ref 6.0–8.3)
Total Bilirubin: 0.2 mg/dL — ABNORMAL LOW (ref 0.3–1.2)

## 2014-10-17 LAB — CBC WITH DIFFERENTIAL/PLATELET
BASOS ABS: 0 10*3/uL (ref 0.0–0.1)
Basophils Relative: 0 % (ref 0–1)
EOS ABS: 0.3 10*3/uL (ref 0.0–0.7)
EOS PCT: 3 % (ref 0–5)
HCT: 36.5 % (ref 36.0–46.0)
HEMOGLOBIN: 12.5 g/dL (ref 12.0–15.0)
LYMPHS ABS: 3.1 10*3/uL (ref 0.7–4.0)
Lymphocytes Relative: 35 % (ref 12–46)
MCH: 29.5 pg (ref 26.0–34.0)
MCHC: 34.2 g/dL (ref 30.0–36.0)
MCV: 86.1 fL (ref 78.0–100.0)
Monocytes Absolute: 0.6 10*3/uL (ref 0.1–1.0)
Monocytes Relative: 7 % (ref 3–12)
Neutro Abs: 4.8 10*3/uL (ref 1.7–7.7)
Neutrophils Relative %: 55 % (ref 43–77)
PLATELETS: 369 10*3/uL (ref 150–400)
RBC: 4.24 MIL/uL (ref 3.87–5.11)
RDW: 12.7 % (ref 11.5–15.5)
WBC: 8.8 10*3/uL (ref 4.0–10.5)

## 2014-10-17 LAB — URINALYSIS, ROUTINE W REFLEX MICROSCOPIC
BILIRUBIN URINE: NEGATIVE
Glucose, UA: NEGATIVE mg/dL
Hgb urine dipstick: NEGATIVE
Ketones, ur: NEGATIVE mg/dL
LEUKOCYTES UA: NEGATIVE
Nitrite: NEGATIVE
Protein, ur: NEGATIVE mg/dL
SPECIFIC GRAVITY, URINE: 1.018 (ref 1.005–1.030)
Urobilinogen, UA: 1 mg/dL (ref 0.0–1.0)
pH: 6 (ref 5.0–8.0)

## 2014-10-17 LAB — PREGNANCY, URINE: PREG TEST UR: NEGATIVE

## 2014-10-17 MED ORDER — OXYCODONE-ACETAMINOPHEN 5-325 MG PO TABS
1.0000 | ORAL_TABLET | Freq: Once | ORAL | Status: AC
  Administered 2014-10-17: 1 via ORAL
  Filled 2014-10-17: qty 1

## 2014-10-17 MED ORDER — OXYCODONE-ACETAMINOPHEN 5-325 MG PO TABS: 1.0000 | ORAL_TABLET | ORAL | Status: DC | PRN

## 2014-10-17 NOTE — ED Notes (Signed)
Pt here for abd pain since Tuesday but is worse. Gets nauseated after eating. No vomiting or diarrhea.

## 2014-10-17 NOTE — ED Provider Notes (Signed)
CSN: [id number]     Arrival date & time 10/17/14  1823 History   First MD Initiated Contact with Patient 10/17/14 2126     Chief Complaint  Patient presents with  . Abdominal Pain     (Consider location/radiation/quality/duration/timing/severity/associated sxs/prior Treatment) Patient is a 39 y.o. female presenting with abdominal pain. The history is provided by the patient and a relative.  Abdominal Pain  Alexandra Aguirre is a 39 y.o. female percents for evaluation of abdominal pain present for 3 days, and worsening.  The pain is in the epigastric and left lower quadrants.  She is able to eat but has had decreased appetite.  She reportedly had a bowel movement today.  There's been no urinary tract symptoms.  She denies fever, cough, chest pain, weakness or dizziness.  Taking Augmentin, for an ear infection, which was started 2 days ago, after onset of the pain.  There are no other known modifying factors.   Past Medical History  Diagnosis Date  . GERD (gastroesophageal reflux disease)   . Asthma   . Abnormal Pap smear of cervix 09/24/2013    AGUS   Past Surgical History  Procedure Laterality Date  . Cervical biopsy  w/ loop electrode excision    . Removal of cyst Right 2011   Family History  Problem Relation Age of Onset  . Cancer Mother     cervical/ovarian  . Diabetes Brother   . Diabetes Maternal Uncle   . Diabetes Maternal Uncle   . Diabetes Maternal Uncle    History  Substance Use Topics  . Smoking status: Never Smoker   . Smokeless tobacco: Never Used  . Alcohol Use: No   OB History    Gravida Para Term Preterm AB TAB SAB Ectopic Multiple Living   Review of Systems  Gastrointestinal: Positive for abdominal pain.  All other systems reviewed and are negative.     Allergies  Review of patient's allergies indicates no known allergies.  Home Medications   Prior to Admission medications   Medication Sig Start Date End Date Taking? Authorizing  Provider  albuterol (PROVENTIL HFA;VENTOLIN HFA) 108 (90 BASE) MCG/ACT inhaler Inhale 2 puffs into the lungs every 6 (six) hours as needed for wheezing or shortness of breath. 01/14/14  Yes Carney Living, MD  amoxicillin-clavulanate (AUGMENTIN) 875-125 MG per tablet Take 1 tablet by mouth 2 (two) times daily. 10/15/14  Yes Tiffany Netta Cedars, PA-C  ibuprofen (ADVIL,MOTRIN) 200 MG tablet Take 400 mg by mouth every 6 (six) hours as needed for moderate pain.   Yes Historical Provider, MD  Multiple Vitamins-Minerals (MULTIVITAMIN PO) Take 1 tablet by mouth daily.   Yes Historical Provider, MD  beclomethasone (QVAR) 40 MCG/ACT inhaler Inhale 2 puffs into the lungs 2 (two) times daily. 01/14/14   Carney Living, MD  meclizine (ANTIVERT) 25 MG tablet Take 1 tablet (25 mg total) by mouth 3 (three) times daily as needed for dizziness. Patient not taking: Reported on 10/15/2014 11/26/13   Catalina Antigua, MD  naproxen (NAPROSYN) 500 MG tablet Take 1 tablet (500 mg total) by mouth 2 (two) times daily with a meal. Patient not taking: Reported on 10/15/2014 01/30/14   Quentin Angst, MD  ondansetron (ZOFRAN) 4 MG tablet Take 1 tablet (4 mg total) by mouth every 6 (six) hours. Patient not taking: Reported on 10/15/2014 08/15/14   Roxy Horseman, PA-C  oxyCODONE-acetaminophen (PERCOCET) 5-325 MG per  tablet Take 1 tablet by mouth every 4 (four) hours as needed for severe pain. 10/17/14   Flint Melter, MD  predniSONE (DELTASONE) 20 MG tablet Take 1 tablet (20 mg total) by mouth 2 (two) times daily with a meal. Patient not taking: Reported on 10/15/2014 01/14/14   Carney Living, MD  sodium-potassium bicarbonate (ALKA-SELTZER GOLD) TBEF dissolvable tablet Take 2 tablets by mouth 2 (two) times daily as needed (for indigestion). 10/15/14   Tiffany Netta Cedars, PA-C   BP 96/56 mmHg  Pulse 73  Temp(Src) 97.8 F (36.6 C) (Oral)  Resp 16  SpO2 97%  LMP 10/08/2014 Physical Exam  Constitutional: She is oriented  to person, place, and time. She appears well-developed and well-nourished.  HENT:  Head: Normocephalic and atraumatic.  Right Ear: External ear normal.  Left Ear: External ear normal.  Eyes: Conjunctivae and EOM are normal. Pupils are equal, round, and reactive to light.  Neck: Normal range of motion and phonation normal. Neck supple.  Cardiovascular: Normal rate, regular rhythm and normal heart sounds.   Pulmonary/Chest: Effort normal and breath sounds normal. She exhibits no bony tenderness.  Abdominal: Soft. Bowel sounds are normal. She exhibits no distension and no mass. There is tenderness (Mild epigastric, and left lower quadrant tenderness.). There is no rebound and no guarding.  Musculoskeletal: Normal range of motion.  Neurological: She is alert and oriented to person, place, and time. No cranial nerve deficit or sensory deficit. She exhibits normal muscle tone. Coordination normal.  Skin: Skin is warm, dry and intact.  Psychiatric: She has a normal mood and affect. Her behavior is normal. Judgment and thought content normal.  Nursing note and vitals reviewed.   ED Course  Procedures (including critical care time)  Medications  oxyCODONE-acetaminophen (PERCOCET/ROXICET) 5-325 MG per tablet 1 tablet (1 tablet Oral Given 10/17/14 2138)    Patient Vitals for the past 24 hrs:  BP Temp Temp src Pulse Resp SpO2  10/18/14 0000 96/56 mmHg - - 73 - 97 %  10/17/14 2316 (!) 91/52 mmHg - - 74 - 97 %  10/17/14 2200 100/64 mmHg - - 67 - 100 %  10/17/14 2130 103/63 mmHg - - 77 16 100 %  10/17/14 2121 112/61 mmHg - - 69 - 100 %  10/17/14 1845 117/70 mmHg 97.8 F (36.6 C) Oral 84 20 98 %    At D/C Reevaluation with update and discussion. After initial assessment and treatment, an updated evaluation reveals pt is comfortable. Findings discussed, questions answered. Jurell Basista L    Labs Review Labs Reviewed  COMPREHENSIVE METABOLIC PANEL - Abnormal; Notable for the following:     Glucose, Bld 189 (*)    Alkaline Phosphatase 122 (*)    Total Bilirubin 0.2 (*)    All other components within normal limits  CBC WITH DIFFERENTIAL/PLATELET  URINALYSIS, ROUTINE W REFLEX MICROSCOPIC  PREGNANCY, URINE    Imaging Review Dg Abd Acute W/chest  10/17/2014   CLINICAL DATA:  Umbilical pain for 3 days.  Chills.  EXAM: ACUTE ABDOMEN SERIES (ABDOMEN 2 VIEW & CHEST 1 VIEW)  COMPARISON:  CT 08/15/2014  FINDINGS: The liver appears to be generous in size. Stool and air is present throughout the colon to the rectum. Bowel gas pattern is negative for obstruction or perforation. Upright view of the chest is negative for significant cardiopulmonary abnormality.  IMPRESSION: Question hepatomegaly. No evidence of bowel obstruction or perforation.   Electronically Signed   By: Ellery Plunk M.D.   On:  10/17/2014 22:49     EKG Interpretation None      MDM   Final diagnoses:  Abdominal pain, unspecified abdominal location    Nonspecific abdominal pain. Numerous prior abdominal CTs. This pain is not c/w kidney stone process, or UTI. Doubt SBI or metabolic instability.   Nursing Notes Reviewed/ Care Coordinated Applicable Imaging Reviewed Interpretation of Laboratory Data incorporated into ED treatment  The patient appears reasonably screened and/or stabilized for discharge and I doubt any other medical condition or other [facility] requiring further screening, evaluation, or treatment in the ED at this time prior to discharge.  Plan: Home Medications- Percocet; Home Treatments- rest; return here if the recommended treatment, does not improve the symptoms; Recommended follow up- PCP prn    Flint MelterElliott L Amsi Grimley, MD 10/18/14 1126

## 2014-10-17 NOTE — Discharge Instructions (Signed)
Dolor abdominal °(Abdominal Pain) °El dolor puede tener muchas causas. Normalmente la causa del dolor abdominal no es una enfermedad y mejorará sin tratamiento. Frecuentemente puede controlarse y tratarse en casa. Su médico le realizará un examen físico y posiblemente solicite análisis de sangre y radiografías para ayudar a determinar la gravedad de su dolor. Sin embargo, en muchos casos, debe transcurrir más tiempo antes de que se pueda encontrar una causa evidente del dolor. Antes de llegar a ese punto, es posible que su médico no sepa si necesita más pruebas o un tratamiento más profundo. °INSTRUCCIONES PARA EL CUIDADO EN EL HOGAR  °Esté atento al dolor para ver si hay cambios. Las siguientes indicaciones ayudarán a aliviar cualquier molestia que pueda sentir: °· Tome solo medicamentos de venta libre o recetados, según las indicaciones del médico. °· No tome laxantes a menos que se lo haya indicado su médico. °· Pruebe con una dieta líquida absoluta (caldo, té o agua) según se lo indique su médico. Introduzca gradualmente una dieta normal, según su tolerancia. °SOLICITE ATENCIÓN MÉDICA SI: °· Tiene dolor abdominal sin explicación. °· Tiene dolor abdominal relacionado con náuseas o diarrea. °· Tiene dolor cuando orina o defeca. °· Experimenta dolor abdominal que lo despierta de noche. °· Tiene dolor abdominal que empeora o mejora cuando come alimentos. °· Tiene dolor abdominal que empeora cuando come alimentos grasosos. °· Tiene fiebre. °SOLICITE ATENCIÓN MÉDICA DE INMEDIATO SI:  °· El dolor no desaparece en un plazo máximo de 2 horas. °· No deja de (vomitar). °· El dolor se siente solo en partes del abdomen, como el lado derecho o la parte inferior izquierda del abdomen. °· Evacúa materia fecal sanguinolenta o negra, de aspecto alquitranado. °ASEGÚRESE DE QUE: °· Comprende estas instrucciones. °· Controlará su afección. °· Recibirá ayuda de inmediato si no mejora o si empeora. °Document Released: 09/05/2005  Document Revised: 09/10/2013 °[employer]® Patient Information ©2015 [employer]. This information is not intended to replace advice given to you by your health care provider. Make sure you discuss any questions you have with your health care provider. ° °

## 2014-11-05 ENCOUNTER — Ambulatory Visit: Payer: Self-pay | Admitting: Internal Medicine

## 2014-11-07 ENCOUNTER — Ambulatory Visit: Payer: Self-pay

## 2014-11-13 ENCOUNTER — Ambulatory Visit: Payer: Self-pay

## 2014-12-05 ENCOUNTER — Ambulatory Visit: Payer: Self-pay

## 2014-12-09 ENCOUNTER — Ambulatory Visit: Payer: Self-pay | Attending: Internal Medicine

## 2014-12-10 ENCOUNTER — Encounter (HOSPITAL_COMMUNITY): Payer: Self-pay | Admitting: Emergency Medicine

## 2014-12-10 ENCOUNTER — Emergency Department (HOSPITAL_COMMUNITY): Payer: No Typology Code available for payment source

## 2014-12-10 ENCOUNTER — Emergency Department (HOSPITAL_COMMUNITY)
Admission: EM | Admit: 2014-12-10 | Discharge: 2014-12-11 | Disposition: A | Discharge status: Home / Self Care | Payer: Self-pay | Attending: Emergency Medicine | Admitting: Emergency Medicine

## 2014-12-10 ENCOUNTER — Encounter (HOSPITAL_COMMUNITY): Payer: Self-pay

## 2014-12-10 ENCOUNTER — Emergency Department (INDEPENDENT_AMBULATORY_CARE_PROVIDER_SITE_OTHER)
Admission: EM | Admit: 2014-12-10 | Discharge: 2014-12-10 | Disposition: A | Discharge status: Other Acute Inpatient Hospital | Payer: Self-pay | Source: Home / Self Care | Attending: Family Medicine | Admitting: Family Medicine

## 2014-12-10 DIAGNOSIS — R197 Diarrhea, unspecified: Secondary | ICD-10-CM | POA: Insufficient documentation

## 2014-12-10 DIAGNOSIS — R1011 Right upper quadrant pain: Secondary | ICD-10-CM | POA: Insufficient documentation

## 2014-12-10 DIAGNOSIS — R630 Anorexia: Secondary | ICD-10-CM | POA: Insufficient documentation

## 2014-12-10 DIAGNOSIS — R112 Nausea with vomiting, unspecified: Secondary | ICD-10-CM | POA: Insufficient documentation

## 2014-12-10 DIAGNOSIS — Z3202 Encounter for pregnancy test, result negative: Secondary | ICD-10-CM | POA: Insufficient documentation

## 2014-12-10 DIAGNOSIS — J45909 Unspecified asthma, uncomplicated: Secondary | ICD-10-CM | POA: Insufficient documentation

## 2014-12-10 DIAGNOSIS — Z7951 Long term (current) use of inhaled steroids: Secondary | ICD-10-CM | POA: Insufficient documentation

## 2014-12-10 DIAGNOSIS — Z8719 Personal history of other diseases of the digestive system: Secondary | ICD-10-CM | POA: Insufficient documentation

## 2014-12-10 DIAGNOSIS — Z79899 Other long term (current) drug therapy: Secondary | ICD-10-CM | POA: Insufficient documentation

## 2014-12-10 LAB — I-STAT CHEM 8, ED
BUN: 11 mg/dL (ref 6–23)
CREATININE: 0.4 mg/dL — AB (ref 0.50–1.10)
Calcium, Ion: 1.19 mmol/L (ref 1.12–1.23)
Chloride: 103 mmol/L (ref 96–112)
Glucose, Bld: 155 mg/dL — ABNORMAL HIGH (ref 70–99)
HCT: 43 % (ref 36.0–46.0)
HEMOGLOBIN: 14.6 g/dL (ref 12.0–15.0)
Potassium: 4.2 mmol/L (ref 3.5–5.1)
Sodium: 137 mmol/L (ref 135–145)
TCO2: 19 mmol/L (ref 0–100)

## 2014-12-10 LAB — HEPATIC FUNCTION PANEL
ALT: 27 U/L (ref 0–35)
AST: 34 U/L (ref 0–37)
Albumin: 3.9 g/dL (ref 3.5–5.2)
Alkaline Phosphatase: 129 U/L — ABNORMAL HIGH (ref 39–117)
BILIRUBIN TOTAL: 0.9 mg/dL (ref 0.3–1.2)
Bilirubin, Direct: 0.4 mg/dL (ref 0.0–0.5)
Indirect Bilirubin: 0.5 mg/dL (ref 0.3–0.9)
Total Protein: 8.4 g/dL — ABNORMAL HIGH (ref 6.0–8.3)

## 2014-12-10 LAB — URINALYSIS, ROUTINE W REFLEX MICROSCOPIC
BILIRUBIN URINE: NEGATIVE
Glucose, UA: NEGATIVE mg/dL
Hgb urine dipstick: NEGATIVE
KETONES UR: NEGATIVE mg/dL
Leukocytes, UA: NEGATIVE
Nitrite: NEGATIVE
PROTEIN: NEGATIVE mg/dL
Specific Gravity, Urine: 1.026 (ref 1.005–1.030)
Urobilinogen, UA: 0.2 mg/dL (ref 0.0–1.0)
pH: 5.5 (ref 5.0–8.0)

## 2014-12-10 LAB — CBC WITH DIFFERENTIAL/PLATELET
BASOS ABS: 0 10*3/uL (ref 0.0–0.1)
Basophils Relative: 0 % (ref 0–1)
Eosinophils Absolute: 0 10*3/uL (ref 0.0–0.7)
Eosinophils Relative: 0 % (ref 0–5)
HCT: 40 % (ref 36.0–46.0)
Hemoglobin: 13.6 g/dL (ref 12.0–15.0)
LYMPHS PCT: 10 % — AB (ref 12–46)
Lymphs Abs: 1.6 10*3/uL (ref 0.7–4.0)
MCH: 29.4 pg (ref 26.0–34.0)
MCHC: 34 g/dL (ref 30.0–36.0)
MCV: 86.6 fL (ref 78.0–100.0)
Monocytes Absolute: 0.5 10*3/uL (ref 0.1–1.0)
Monocytes Relative: 4 % (ref 3–12)
NEUTROS ABS: 13.5 10*3/uL — AB (ref 1.7–7.7)
NEUTROS PCT: 86 % — AB (ref 43–77)
Platelets: 442 10*3/uL — ABNORMAL HIGH (ref 150–400)
RBC: 4.62 MIL/uL (ref 3.87–5.11)
RDW: 13.3 % (ref 11.5–15.5)
WBC: 15.6 10*3/uL — AB (ref 4.0–10.5)

## 2014-12-10 LAB — URINE MICROSCOPIC-ADD ON

## 2014-12-10 LAB — POC URINE PREG, ED: PREG TEST UR: NEGATIVE

## 2014-12-10 LAB — LIPASE, BLOOD: Lipase: 22 U/L (ref 11–59)

## 2014-12-10 MED ORDER — ONDANSETRON HCL 4 MG/2ML IJ SOLN
4.0000 mg | Freq: Once | INTRAMUSCULAR | Status: AC
  Administered 2014-12-10: 4 mg via INTRAVENOUS
  Filled 2014-12-10: qty 2

## 2014-12-10 MED ORDER — HYDROMORPHONE HCL 1 MG/ML IJ SOLN
1.0000 mg | Freq: Once | INTRAMUSCULAR | Status: AC
  Administered 2014-12-10: 1 mg via INTRAVENOUS
  Filled 2014-12-10: qty 1

## 2014-12-10 MED ORDER — SODIUM CHLORIDE 0.9 % IV BOLUS (SEPSIS)
1000.0000 mL | Freq: Once | INTRAVENOUS | Status: AC
  Administered 2014-12-10: 1000 mL via INTRAVENOUS

## 2014-12-10 NOTE — ED Provider Notes (Signed)
CSN: [id number]     Arrival date & time 12/10/14  2004 History   First MD Initiated Contact with Patient 12/10/14 2143     Chief Complaint  Patient presents with  . Abdominal Pain     (Consider location/radiation/quality/duration/timing/severity/associated sxs/prior Treatment) Patient is a 39 y.o. female presenting with abdominal pain. The history is provided by the patient.  Abdominal Pain Pain location:  RUQ Pain quality: aching, cramping and sharp   Pain radiates to:  Does not radiate Pain severity:  Severe Onset quality:  Gradual Duration:  2 days Timing:  Constant Progression:  Worsening Chronicity:  New Context: eating   Relieved by:  Nothing Worsened by:  Eating and palpation Ineffective treatments:  None tried Associated symptoms: anorexia, diarrhea, nausea and vomiting   Associated symptoms: no chills, no dysuria and no fever     Past Medical History  Diagnosis Date  . GERD (gastroesophageal reflux disease)   . Asthma   . Abnormal Pap smear of cervix 09/24/2013    AGUS   Past Surgical History  Procedure Laterality Date  . Cervical biopsy  w/ loop electrode excision    . Removal of cyst Right 2011   Family History  Problem Relation Age of Onset  . Cancer Mother     cervical/ovarian  . Diabetes Brother   . Diabetes Maternal Uncle   . Diabetes Maternal Uncle   . Diabetes Maternal Uncle    History  Substance Use Topics  . Smoking status: Never Smoker   . Smokeless tobacco: Never Used  . Alcohol Use: No   OB History    Gravida Para Term Preterm AB TAB SAB Ectopic Multiple Living   Review of Systems  Constitutional: Negative for fever and chills.  Gastrointestinal: Positive for nausea, vomiting, abdominal pain, diarrhea and anorexia.  Genitourinary: Negative for dysuria.  All other systems reviewed and are negative.     Allergies  Fruit & vegetable daily  Home Medications   Prior to Admission medications   Medication Sig  Start Date End Date Taking? Authorizing Provider  albuterol (PROVENTIL HFA;VENTOLIN HFA) 108 (90 BASE) MCG/ACT inhaler Inhale 2 puffs into the lungs every 6 (six) hours as needed for wheezing or shortness of breath. 01/14/14  Yes Carney Living, MD  aspirin-acetaminophen-caffeine (EXCEDRIN MIGRAINE) [phone] MG per tablet Take 1 tablet by mouth as needed for headache or migraine.   Yes Historical Provider, MD  beclomethasone (QVAR) 40 MCG/ACT inhaler Inhale 2 puffs into the lungs 2 (two) times daily. 01/14/14  Yes Carney Living, MD  bismuth subsalicylate (PEPTO BISMOL) 262 MG chewable tablet Chew 524 mg by mouth as needed for indigestion or diarrhea or loose stools.   Yes Historical Provider, MD  bismuth subsalicylate (PEPTO BISMOL) 262 MG/15ML suspension Take 30 mLs by mouth 2 (two) times daily as needed for indigestion or diarrhea or loose stools.   Yes Historical Provider, MD  ibuprofen (ADVIL,MOTRIN) 200 MG tablet Take 200 mg by mouth every 6 (six) hours as needed for mild pain or moderate pain.    Yes Historical Provider, MD  Multiple Vitamins-Minerals (MULTIVITAMIN PO) Take 1 tablet by mouth daily.   Yes Historical Provider, MD  sodium-potassium bicarbonate (ALKA-SELTZER GOLD) TBEF dissolvable tablet Take 2 tablets by mouth 2 (two) times daily as needed (for indigestion). Patient taking differently: Take 1-2 tablets by mouth 2 (two) times daily as needed (for indigestion).  10/15/14  Yes  Vivianne Masteriffany S Noel, PA-C  amoxicillin-clavulanate (AUGMENTIN) 875-125 MG per tablet Take 1 tablet by mouth 2 (two) times daily. Patient not taking: Reported on 12/10/2014 10/15/14   Vivianne Masteriffany S Noel, PA-C  dexamethasone (DECADRON) 6 MG tablet Take 12 mg by mouth once.    Historical Provider, MD  meclizine (ANTIVERT) 25 MG tablet Take 1 tablet (25 mg total) by mouth 3 (three) times daily as needed for dizziness. Patient not taking: Reported on 10/15/2014 11/26/13   Catalina AntiguaPeggy Constant, MD  montelukast (SINGULAIR) 5  MG chewable tablet Chew 10 mg by mouth once.    Historical Provider, MD  naproxen (NAPROSYN) 500 MG tablet Take 1 tablet (500 mg total) by mouth 2 (two) times daily with a meal. Patient not taking: Reported on 10/15/2014 01/30/14   Quentin Angstlugbemiga E Jegede, MD  ondansetron (ZOFRAN) 4 MG tablet Take 1 tablet (4 mg total) by mouth every 6 (six) hours. Patient not taking: Reported on 10/15/2014 08/15/14   Roxy Horsemanobert Browning, PA-C  oxyCODONE-acetaminophen (PERCOCET) 5-325 MG per tablet Take 1 tablet by mouth every 4 (four) hours as needed for severe pain. Patient not taking: Reported on 12/10/2014 10/17/14   Mancel BaleElliott Wentz, MD  predniSONE (DELTASONE) 20 MG tablet Take 1 tablet (20 mg total) by mouth 2 (two) times daily with a meal. Patient not taking: Reported on 10/15/2014 01/14/14   Carney LivingMarshall L Chambliss, MD   BP 112/62 mmHg  Pulse 88  Temp(Src) 97.7 F (36.5 C) (Oral)  Resp 18  SpO2 97%  LMP 11/30/2014 Physical Exam  Constitutional: She appears well-developed and well-nourished. She appears distressed (in obvious pain).  HENT:  Head: Normocephalic and atraumatic.  Mouth/Throat: Oropharynx is clear and moist.  Eyes: Pupils are equal, round, and reactive to light.  Cardiovascular: Normal rate, regular rhythm, normal heart sounds and intact distal pulses.  Exam reveals no gallop and no friction rub.   No murmur heard. Pulmonary/Chest: Breath sounds normal. No respiratory distress. She has no wheezes. She has no rales.  Abdominal: Soft. She exhibits no distension. There is tenderness (RUQ pain with positive Murphy's. No rebound or guarding.). There is no rebound and no guarding.  Skin: Skin is warm and dry. No rash noted. She is not diaphoretic.  Psychiatric: She has a normal mood and affect. Her behavior is normal. Judgment and thought content normal.  Nursing note and vitals reviewed.   ED Course  Procedures (including critical care time) Labs Review Labs Reviewed  CBC WITH DIFFERENTIAL/PLATELET -  Abnormal; Notable for the following:    WBC 15.6 (*)    Platelets 442 (*)    Neutrophils Relative % 86 (*)    Neutro Abs 13.5 (*)    Lymphocytes Relative 10 (*)    All other components within normal limits  URINALYSIS, ROUTINE W REFLEX MICROSCOPIC - Abnormal; Notable for the following:    APPearance TURBID (*)    All other components within normal limits  URINE MICROSCOPIC-ADD ON - Abnormal; Notable for the following:    Bacteria, UA MANY (*)    All other components within normal limits  HEPATIC FUNCTION PANEL - Abnormal; Notable for the following:    Total Protein 8.4 (*)    Alkaline Phosphatase 129 (*)    All other components within normal limits  I-STAT CHEM 8, ED - Abnormal; Notable for the following:    Creatinine, Ser 0.40 (*)    Glucose, Bld 155 (*)    All other components within normal limits  LIPASE, BLOOD  POC URINE PREG,  ED    Imaging Review No results found.   EKG Interpretation None      MDM   Final diagnoses:  RUQ pain   39 year old female presents with right upper quadrant pain, nausea, vomiting, diarrhea. Symptoms present for 2 days. Attributes this to eating oysters at a buffet. Also with anorexia since this episode. Laboratory workup at urgent care with leukocytosis, negative UA, reassuring basic metabolic panel. We will add on LFTs and lipase. Right upper quadrant ultrasound ordered. Fluids and pain meds and nausea meds given. Differential includes cholecystitis, viral enteritis, food-related illness.  11:30 PM hepatic function panel reassuring. Lipase negative. Pain improved. Right upper quadrant ultrasound pending for disposition. If negative, will discharge to home with supportive care, antiemetics, pain control.  Dorna Leitz, MD 12/10/14 1610  Gerhard Munch, MD 12/11/14 2352

## 2014-12-10 NOTE — ED Notes (Signed)
Pt sent to ED from Urgent Care ref. Upper abd pain and diarrhea onset yesterday after eating oysters at a buffet.  Denies vomiting.

## 2014-12-10 NOTE — ED Notes (Signed)
MD at bedside. 

## 2014-12-10 NOTE — ED Provider Notes (Signed)
Alexandra Aguirre is a 39 y.o. female who presents to Urgent Care today for Abdominal pain and diarrhea starting yesterday after eating oysters at a [employer]. She has tried Pepto-Bismol which has not helped. She denies any history of abdominal surgery. She denies any fevers or chills. She denies any significant vomiting or blood in the stool. The pain and diarrhea worsen after eating.   Past Medical History  Diagnosis Date  . GERD (gastroesophageal reflux disease)   . Asthma   . Abnormal Pap smear of cervix 09/24/2013    AGUS   Past Surgical History  Procedure Laterality Date  . Cervical biopsy  w/ loop electrode excision    . Removal of cyst Right 2011   History  Substance Use Topics  . Smoking status: Never Smoker   . Smokeless tobacco: Never Used  . Alcohol Use: No   ROS as above Medications: No current facility-administered medications for this encounter.   Current Outpatient Prescriptions  Medication Sig Dispense Refill  . albuterol (PROVENTIL HFA;VENTOLIN HFA) 108 (90 BASE) MCG/ACT inhaler Inhale 2 puffs into the lungs every 6 (six) hours as needed for wheezing or shortness of breath. 1 Inhaler 3  . amoxicillin-clavulanate (AUGMENTIN) 875-125 MG per tablet Take 1 tablet by mouth 2 (two) times daily. 20 tablet 0  . beclomethasone (QVAR) 40 MCG/ACT inhaler Inhale 2 puffs into the lungs 2 (two) times daily. 1 Inhaler 12  . ibuprofen (ADVIL,MOTRIN) 200 MG tablet Take 400 mg by mouth every 6 (six) hours as needed for moderate pain.    . meclizine (ANTIVERT) 25 MG tablet Take 1 tablet (25 mg total) by mouth 3 (three) times daily as needed for dizziness. (Patient not taking: Reported on 10/15/2014) 30 tablet 3  . Multiple Vitamins-Minerals (MULTIVITAMIN PO) Take 1 tablet by mouth daily.    . naproxen (NAPROSYN) 500 MG tablet Take 1 tablet (500 mg total) by mouth 2 (two) times daily with a meal. (Patient not taking: Reported on 10/15/2014) 30 tablet 0  . ondansetron (ZOFRAN) 4 MG  tablet Take 1 tablet (4 mg total) by mouth every 6 (six) hours. (Patient not taking: Reported on 10/15/2014) 12 tablet 0  . oxyCODONE-acetaminophen (PERCOCET) 5-325 MG per tablet Take 1 tablet by mouth every 4 (four) hours as needed for severe pain. 20 tablet 0  . predniSONE (DELTASONE) 20 MG tablet Take 1 tablet (20 mg total) by mouth 2 (two) times daily with a meal. (Patient not taking: Reported on 10/15/2014) 10 tablet 1  . sodium-potassium bicarbonate (ALKA-SELTZER GOLD) TBEF dissolvable tablet Take 2 tablets by mouth 2 (two) times daily as needed (for indigestion). 30 tablet 0   No Known Allergies   Exam:  BP 116/82 mmHg  Pulse 100  Temp(Src) 97.7 F (36.5 C) (Oral)  Resp 16  SpO2 99% Gen: Well NAD HEENT: EOMI,  MMM Lungs: Normal work of breathing. CTABL Heart: RRR no MRG Abd: NABS, Soft. Tender palpation right upper quadrant with positive Murphy sign. Exts: Brisk capillary refill, warm and well perfused.   No results found for this or any previous visit (from the past 24 hour(s)). No results found.  Assessment and Plan: 39 y.o. female with abdominal pain and diarrhea. This is concerning for acute cholecystitis. Transfer to ED for further evaluation and management.  Discussed warning signs or symptoms. Please see discharge instructions. Patient expresses understanding.     Rodolph BongEvan S Gauri Galvao, MD 12/10/14 [phone]

## 2014-12-10 NOTE — ED Notes (Signed)
Spanish speaker, son here to translate. Ate chinese buffet yesterday, and reportedly felt bad after eating oysters. Has many loose watery stools since last PM. No relief w Pepto bismol. NAD at present.

## 2014-12-11 MED ORDER — DICYCLOMINE HCL 10 MG PO CAPS
20.0000 mg | ORAL_CAPSULE | Freq: Once | ORAL | Status: AC
  Administered 2014-12-11: 20 mg via ORAL
  Filled 2014-12-11: qty 2

## 2014-12-11 MED ORDER — RANITIDINE HCL 150 MG/10ML PO SYRP
300.0000 mg | ORAL_SOLUTION | Freq: Once | ORAL | Status: AC
  Administered 2014-12-11: 300 mg via ORAL
  Filled 2014-12-11: qty 20

## 2014-12-11 MED ORDER — RANITIDINE HCL 150 MG PO CAPS: 150.0000 mg | ORAL_CAPSULE | Freq: Every day | ORAL | Status: DC

## 2014-12-11 NOTE — ED Provider Notes (Signed)
Patient signed out as pending right upper quadrant ultrasound to evaluate for cholecystitis. This was returned as negative. Upon my evaluation the patient she still has minimal midepigastric pain. Gastritis also noted differential, we'll give the patient Bentyl and ranitidine the emergency department and discharged with a prescription. She does have primary care follow-up and she was advised to make an appointment within the next 3 days. Patient to diarrhea as well, this could be a viral gastroenteritis that has been going around town recently. Her vital signs currently remain within her normal limits patient appears well and she is safe for discharge.  Tomasita CrumbleAdeleke Quartez [place], MD 12/11/14 0120

## 2014-12-11 NOTE — Discharge Instructions (Signed)
Dolor abdominal en las mujeres (Abdominal Pain, Women) Ms. Alexandra Aguirre, your ultrasound did not show any problems with her gallbladder. Take medicine as prescribed and follow-up with her primary care physician within 3 days. If symptoms worsen come back to emergency department immediately. Be sure to stay well-hydrated as this illness runs its course. Thank you.  Sra. [name], su ultrasonido no mostr problemas con su vescula biliar. Tome los United Parcelmedicamentos segn lo prescrito y el seguimiento con su mdico de atencin primaria dentro de los [address]  [address]. Si los sntomas empeoran volver a el departamento de Sports administratoremergencias. Asegrese de United Technologies Corporationmantenerse bien hidratado ya que esta enfermedad sigue su curso. Gracias. El dolor abdominal (en el estmago, la pelvis o el vientre) puede tener muchas causas. Es importante que le informe a su mdico:  La ubicacin del Engineer, miningdolor.  Viene y se va, o persiste todo el tiempo?  Hay situaciones que Location managerinician el dolor (comer ciertos alimentos, la actividad fsica)?  Tiene otros sntomas asociados al dolor (fiebre, nuseas, vmitos, diarrea)? Todo es de gran ayuda cuando se trata de hallar la causa del dolor. CAUSAS  Estmago: Infecciones por virus o bacterias, o lcera.  Intestino: Apendicitis (apndice inflamado), ileitis regional (enfermedad de Crohn), colitis ulcerosa (colon inflamado), sndrome del colon irritable, diverticulitis (inflamacin de los divertculos del colon) o cncer de estmago oo intestino.  Enfermedades de la vescula biliar o clculos.  Enfermedades renales, clculos o infecciones en el rin.  Infeccin o cncer del pncreas.  Fibromialgia (trastorno doloroso)  Enfermedades de los rganos femeninos:  Uterus: tero: fibroma (tumor no canceroso) o infeccin  Trompas de [place]: infeccin o embarazo ectpico  En los ovarios, quistes o tumores.  Adherencias plvicas (tejido cicatrizal).  Endometriosis (el tejido que cubre el tero se desarrolla en la  pelvis y los rganos plvicos).  Sndrome de Agricultural engineercongestin plvica (los rganos femeninos se llenan de sangre antes del periodo menstrual(  Dolor durante el periodo menstrual.  Dolor durante la ovulacin (al producir vulos).  Dolor al usar el DIU (dispositivo intrauterino para el control de la natalidad)  Psychologist, clinicalCncer en los rganos femeninos.  Dolor funcional (no est originado en una enfermedad, puede mejorar sin tratamiento).  Dolor de origen psicolgico  Depresin. DIAGNSTICO Su mdico decidir la gravedad del dolor a travs del examen fsico  Anlisis de sangre  Radiografas  Ecografas  TC (tomografa computada, tipo especial de radiografas).  IMR (resonancia magntica)  Cultivos, en el caso una infeccin  Colon por enema de bario (se inserta una sustancia de contraste en el intestino grueso para mejorar la observacin con rayos X.)  Colonoscopa (observacin del intestino con un tubo luminoso).  Laparoscopa (examen del interior del abdomen con un tubo que tiene Intel Corporationuna luz).  Ciruga exploratoria abdominal mayor (se observa el abdomen realizando una gran incisin). TRATAMIENTO El tratamiento depender de la causa del problema.   Muchos de estos casos pueden controlarse y tratarse en casa.  Medicamentos de venta libre indicados por el mdico.  Medicamentos con receta.  Antibiticos, en caso de infeccin  Pldoras anticonceptivas, en el caso de perodos dolorosos o dolor al ovular.  Tratamiento hormonal, para la endometriosis  Inyecciones para bloqueo nervioso selectivo.  Fisioterapia.  Antidepresivos.  Consejos por parte de un psclogo o psiquiatra.  Ciruga mayor o menor. INSTRUCCIONES PARA EL CUIDADO DOMICILIARIO  No tome ni administre laxantes a menos que se lo haya indicado su mdico.  Tome analgsicos de venta libre slo si se lo ha indicado el profesional que lo asiste. No tome aspirina,  ya que puede causar [employer] en el estmago o  hemorragias.  Consuma una dieta lquida (caldo o agua) segn lo indicado por el mdico. Progrese lentamente a una dieta blanda, segn la tolerancia, si el dolor se relaciona con el estmago o el intestino.  Tenga un termmetro y tmese la temperatura varias veces al da.  Haga reposo en la cama y [place], si esto Research scientist (life sciences).  Evite las relaciones sexuales, Counsellor.  Evite las situaciones estresantes.  Cumpla con las visitas y los anlisis de control, segn las indicaciones de su mdico.  Si el dolor no se [place] con los medicamentos o la [place] tratar con:  Acupuntura.  Ejercicios de relajacin (yoga, meditacin).  Terapia grupal.  Psicoterapia. SOLICITE ATENCIN MDICA SI:  Nota que ciertos Pharmacist, community de [place].  El tratamiento indicado para Arboriculturist no Marketing executive.  Necesita analgsicos ms fuertes.  Quiere que le retiren el DIU.  Si se siente confundido o desfalleciente.  Presenta nuseas o vmitos.  Aparece una erupcin cutnea.  Sufre efectos adversos o una reaccin alrgica debido a los medicamentos que toma. SOLICITE ATENCIN MDICA DE INMEDIATO SI:  El dolor persiste o se agrava.  Tiene fiebre.  Siente el dolor slo en algunos sectores del abdomen. Si se localiza en la zona derecha, posiblemente podra tratarse de apendicitis. En un adulto, si se localiza en la regin inferior izquierda del abdomen, podra tratarse de colitis o diverticulitis.  Hay sangre en las heces (deposiciones de color rojo brillante o negro alquitranado), con o sin vmitos.  Usted presenta sangre en la orina.  Siente escalofros con o sin fiebre.  Se desmaya. ASEGRESE QUE:   Comprende estas instrucciones.  Controlar su enfermedad.  Solicitar ayuda de inmediato si no mejora o si empeora. Document Released: 12/22/2008 Document Revised: 11/28/2011 [facility] Patient Information 2015 [place], [place]. This  information is not intended to replace advice given to you by your health care provider. Make sure you discuss any questions you have with your health care provider.

## 2014-12-16 ENCOUNTER — Ambulatory Visit: Payer: No Typology Code available for payment source | Admitting: Internal Medicine

## 2014-12-17 ENCOUNTER — Ambulatory Visit: Payer: No Typology Code available for payment source | Attending: Internal Medicine | Admitting: Internal Medicine

## 2014-12-17 ENCOUNTER — Encounter: Payer: Self-pay | Admitting: Internal Medicine

## 2014-12-17 ENCOUNTER — Ambulatory Visit (HOSPITAL_COMMUNITY)
Admission: AD | Admit: 2014-12-17 | Discharge: 2014-12-17 | Disposition: A | Discharge status: Home / Self Care | Payer: No Typology Code available for payment source | Source: Ambulatory Visit | Attending: Internal Medicine | Admitting: Internal Medicine

## 2014-12-17 ENCOUNTER — Ambulatory Visit (HOSPITAL_COMMUNITY)
Admission: RE | Admit: 2014-12-17 | Discharge: 2014-12-17 | Disposition: A | Discharge status: Home / Self Care | Payer: No Typology Code available for payment source | Source: Ambulatory Visit | Attending: Internal Medicine | Admitting: Internal Medicine

## 2014-12-17 VITALS — BP 118/76 | HR 86 | Temp 98.0°F | Resp 16 | Wt 190.0 lb

## 2014-12-17 DIAGNOSIS — M545 Low back pain, unspecified: Secondary | ICD-10-CM

## 2014-12-17 DIAGNOSIS — G8929 Other chronic pain: Secondary | ICD-10-CM

## 2014-12-17 DIAGNOSIS — Z79899 Other long term (current) drug therapy: Secondary | ICD-10-CM | POA: Insufficient documentation

## 2014-12-17 DIAGNOSIS — Z9109 Other allergy status, other than to drugs and biological substances: Secondary | ICD-10-CM

## 2014-12-17 DIAGNOSIS — Z91048 Other nonmedicinal substance allergy status: Secondary | ICD-10-CM

## 2014-12-17 DIAGNOSIS — Z23 Encounter for immunization: Secondary | ICD-10-CM

## 2014-12-17 DIAGNOSIS — M47896 Other spondylosis, lumbar region: Secondary | ICD-10-CM | POA: Insufficient documentation

## 2014-12-17 DIAGNOSIS — J3089 Other allergic rhinitis: Secondary | ICD-10-CM | POA: Insufficient documentation

## 2014-12-17 DIAGNOSIS — K219 Gastro-esophageal reflux disease without esophagitis: Secondary | ICD-10-CM

## 2014-12-17 DIAGNOSIS — Z7982 Long term (current) use of aspirin: Secondary | ICD-10-CM | POA: Insufficient documentation

## 2014-12-17 DIAGNOSIS — J45909 Unspecified asthma, uncomplicated: Secondary | ICD-10-CM | POA: Insufficient documentation

## 2014-12-17 DIAGNOSIS — Z7952 Long term (current) use of systemic steroids: Secondary | ICD-10-CM | POA: Insufficient documentation

## 2014-12-17 MED ORDER — IBUPROFEN 600 MG PO TABS: 600.0000 mg | ORAL_TABLET | Freq: Four times a day (QID) | ORAL | Status: DC | PRN

## 2014-12-17 MED ORDER — CETIRIZINE HCL 10 MG PO TABS: 10.0000 mg | ORAL_TABLET | Freq: Every day | ORAL | Status: DC

## 2014-12-17 MED ORDER — RANITIDINE HCL 150 MG PO CAPS: 150.0000 mg | ORAL_CAPSULE | Freq: Every day | ORAL | Status: DC

## 2014-12-17 NOTE — Progress Notes (Signed)
MRN: [id number] Name: Alexandra Aguirre  Sex: female Age: 39 y.o. DOB: December 03, 1975  Allergies: Fruit & vegetable daily  Chief Complaint  Patient presents with  . Back Pain    HPI: Patient is 39 y.o. female who has history of GERD comes today requesting refill on Zantac which she takes for the symptoms, she also reported to have chronic lower back pain almost a year at that time she reported she fellbut she denies any recent fall or trauma she takes over-the-counter ibuprofen when necessary denies any numbness weakness denies any incontinence denies any shooting pain down to the legs.patient is also complaining of lot of allergy symptoms and is requesting some medication , denies any fever chills cough chest pain or shortness of breath.  Past Medical History  Diagnosis Date  . GERD (gastroesophageal reflux disease)   . Asthma   . Abnormal Pap smear of cervix 09/24/2013    AGUS    Past Surgical History  Procedure Laterality Date  . Cervical biopsy  w/ loop electrode excision    . Removal of cyst Right 2011      Medication List       This list is accurate as of: 12/17/14  5:30 PM.  Always use your most recent med list.               albuterol 108 (90 BASE) MCG/ACT inhaler  Commonly known as:  PROVENTIL HFA;VENTOLIN HFA  Inhale 2 puffs into the lungs every 6 (six) hours as needed for wheezing or shortness of breath.     amoxicillin-clavulanate 875-125 MG per tablet  Commonly known as:  AUGMENTIN  Take 1 tablet by mouth 2 (two) times daily.     aspirin-acetaminophen-caffeine 250-250-65 MG per tablet  Commonly known as:  EXCEDRIN MIGRAINE  Take 1 tablet by mouth as needed for headache or migraine.     beclomethasone 40 MCG/ACT inhaler  Commonly known as:  QVAR  Inhale 2 puffs into the lungs 2 (two) times daily.     bismuth subsalicylate 262 MG/15ML suspension  Commonly known as:  PEPTO BISMOL  Take 30 mLs by mouth 2 (two) times daily as needed for indigestion or  diarrhea or loose stools.     bismuth subsalicylate 262 MG chewable tablet  Commonly known as:  PEPTO BISMOL  Chew 524 mg by mouth as needed for indigestion or diarrhea or loose stools.     cetirizine 10 MG tablet  Commonly known as:  ZYRTEC  Take 1 tablet (10 mg total) by mouth daily.     dexamethasone 6 MG tablet  Commonly known as:  DECADRON  Take 12 mg by mouth once.     ibuprofen 600 MG tablet  Commonly known as:  ADVIL,MOTRIN  Take 1 tablet (600 mg total) by mouth every 6 (six) hours as needed for moderate pain.     meclizine 25 MG tablet  Commonly known as:  ANTIVERT  Take 1 tablet (25 mg total) by mouth 3 (three) times daily as needed for dizziness.     montelukast 5 MG chewable tablet  Commonly known as:  SINGULAIR  Chew 10 mg by mouth once.     MULTIVITAMIN PO  Take 1 tablet by mouth daily.     naproxen 500 MG tablet  Commonly known as:  NAPROSYN  Take 1 tablet (500 mg total) by mouth 2 (two) times daily with a meal.     ondansetron 4 MG tablet  Commonly known as:  ZOFRAN  Take 1 tablet (4 mg total) by mouth every 6 (six) hours.     oxyCODONE-acetaminophen 5-325 MG per tablet  Commonly known as:  PERCOCET  Take 1 tablet by mouth every 4 (four) hours as needed for severe pain.     predniSONE 20 MG tablet  Commonly known as:  DELTASONE  Take 1 tablet (20 mg total) by mouth 2 (two) times daily with a meal.     ranitidine 150 MG capsule  Commonly known as:  ZANTAC  Take 1 capsule (150 mg total) by mouth daily.     sodium-potassium bicarbonate Tbef dissolvable tablet  Commonly known as:  ALKA-SELTZER GOLD  Take 2 tablets by mouth 2 (two) times daily as needed (for indigestion).        Meds ordered this encounter  Medications  . cetirizine (ZYRTEC) 10 MG tablet    Sig: Take 1 tablet (10 mg total) by mouth daily.    Dispense:  30 tablet    Refill:  3  . ibuprofen (ADVIL,MOTRIN) 600 MG tablet    Sig: Take 1 tablet (600 mg total) by mouth every 6 (six)  hours as needed for moderate pain.    Dispense:  30 tablet    Refill:  0  . ranitidine (ZANTAC) 150 MG capsule    Sig: Take 1 capsule (150 mg total) by mouth daily.    Dispense:  30 capsule    Refill:  3    Immunization History  Administered Date(s) Administered  . Influenza,inj,Quad PF,36+ Mos 08/29/2013, 10/15/2014  . Tdap 03/12/2013    Family History  Problem Relation Age of Onset  . Cancer Mother     cervical/ovarian  . Diabetes Brother   . Diabetes Maternal Uncle   . Diabetes Maternal Uncle   . Diabetes Maternal Uncle     History  Substance Use Topics  . Smoking status: Never Smoker   . Smokeless tobacco: Never Used  . Alcohol Use: No    Review of Systems   As noted in HPI  Filed Vitals:   12/17/14 1706  BP: 118/76  Pulse: 86  Temp: 98 F (36.7 C)  Resp: 16    Physical Exam  Physical Exam  Constitutional: No distress.  Cardiovascular: Normal rate and regular rhythm.   Pulmonary/Chest: Breath sounds normal. No respiratory distress. She has no wheezes. She has no rales.  Abdominal: Soft. There is no tenderness. There is no rebound.  Musculoskeletal:  Lower lumbar paraspinal tenderness, SLR test negative, DTR 2+, equal strength both lower extremities.    CBC    Component Value Date/Time   WBC 15.6* 12/10/2014 2025   RBC 4.62 12/10/2014 2025   HGB 14.6 12/10/2014 2037   HCT 43.0 12/10/2014 2037   PLT 442* 12/10/2014 2025   MCV 86.6 12/10/2014 2025   LYMPHSABS 1.6 12/10/2014 2025   MONOABS 0.5 12/10/2014 2025   EOSABS 0.0 12/10/2014 2025   BASOSABS 0.0 12/10/2014 2025    CMP     Component Value Date/Time   NA 137 12/10/2014 2037   K 4.2 12/10/2014 2037   CL 103 12/10/2014 2037   CO2 29 10/17/2014 1850   GLUCOSE 155* 12/10/2014 2037   BUN 11 12/10/2014 2037   CREATININE 0.40* 12/10/2014 2037   CREATININE 0.49* 02/18/2014 0959   CALCIUM 8.7 10/17/2014 1850   PROT 8.4* 12/10/2014 2145   ALBUMIN 3.9 12/10/2014 2145   AST 34 12/10/2014  2145   ALT 27 12/10/2014 2145   ALKPHOS 129* 12/10/2014 2145  BILITOT 0.9 12/10/2014 2145   GFRNONAA >90 10/17/2014 1850   GFRNONAA >89 02/18/2014 0959   GFRAA >90 10/17/2014 1850   GFRAA >89 02/18/2014 0959    No results found for: CHOL  No components found for: HGA1C  Lab Results  Component Value Date/Time   AST 34 12/10/2014 09:45 PM    Assessment and Plan  Gastroesophageal reflux disease, esophagitis presence not specified - Plan:lifestyle modification, she is given refill on  ranitidine (ZANTAC) 150 MG capsule  Chronic lower back pain - Plan: DG Lumbar Spine Complete, ibuprofen (ADVIL,MOTRIN) 600 MG tablet  Environmental allergies - Plan: cetirizine (ZYRTEC) 10 MG tablet   Health Maintenance  -Vaccinations:  Up-to-date with flu shot  Return in about 3 months (around 03/19/2015), or if symptoms worsen or fail to improve.   This note has been created with Education officer, environmentalDragon speech recognition software and smart phrase technology. Any transcriptional errors are unintentional.    Doris CheadleADVANI, Larue Lightner, MD

## 2014-12-17 NOTE — Progress Notes (Signed)
Interpreter line used-Alexandra Aguirre ID [id number] Patient states she fell about a  Year ago Since her fall she complains of pain to her left hip area and pain to her coccyx area Patient states it hurts to even sit down

## 2014-12-25 ENCOUNTER — Telehealth: Payer: Self-pay | Admitting: Internal Medicine

## 2014-12-25 NOTE — Telephone Encounter (Signed)
Patient called to request her x-ray results, please f/u with pt. °

## 2014-12-26 ENCOUNTER — Telehealth: Payer: Self-pay

## 2014-12-26 NOTE — Telephone Encounter (Signed)
Alexandra Aguirre used Patient is aware of her x ray results

## 2014-12-26 NOTE — Telephone Encounter (Signed)
-----   Message from Doris Cheadleeepak Advani, MD sent at 12/18/2014 12:09 PM EDT ----- Call and let the patient know that her x-ray reported IMPRESSION: 1. Diffuse mild degenerative change. No acute abnormality. 2. IUD noted in good anatomic position.

## 2015-01-28 ENCOUNTER — Other Ambulatory Visit: Payer: Self-pay | Admitting: Family Medicine

## 2015-01-28 ENCOUNTER — Ambulatory Visit: Payer: No Typology Code available for payment source | Attending: Internal Medicine | Admitting: Family Medicine

## 2015-01-28 VITALS — BP 100/70 | HR 85 | Wt 191.0 lb

## 2015-01-28 DIAGNOSIS — L259 Unspecified contact dermatitis, unspecified cause: Secondary | ICD-10-CM

## 2015-01-28 MED ORDER — HYDROCORTISONE 0.5 % EX CREA: 1.0000 "application " | TOPICAL_CREAM | Freq: Two times a day (BID) | CUTANEOUS | Status: DC

## 2015-01-28 NOTE — Progress Notes (Signed)
Subjective:     Patient ID: Alexandra Aguirre, female   DOB: October 04, 1975, 39 y.o.   MRN: [id number]  HPI  Patient presents today with rash on both hands. This has been a problem for several months. 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 She noticed that when she move to another department, the rash cleared and when she was transferred back, the rash returned. She has tried several OTC creams and ointments and one prescription cream that had been prescribed for her son. It is unclear whether this helped and she keeps coming in contact with the same products.   Review of Systems   Denies rash elsewhere on body, Denies SOB, chest pain, headaches, no fever or chills.     Objective:   Physical Exam   GEN: Alert, oriented, appropriate, in no distress. Skin:  Warm and dry with a papular rash on both hands and wrists. Vital Signs:  BP 100/70, P 85, R. Even non labored     Assessment:     Contact Dermatitis    Plan:     Hydrocortisone cream .5% bid for 2 weeks. A note explaining this rash is very likely to be related to laundry products at work and it would be appropriate to move her to a different area. If unable to move to a different area, wear gloves  Follow-up  PRN   Alexandra HooverLinda C. Rena Sweeden, FNP-BC [facility].

## 2015-01-28 NOTE — Patient Instructions (Signed)
Use cream for two weeks. Will be best to move to another area at work.  If unable to do so at least needs to wear gloves.

## 2015-02-05 ENCOUNTER — Ambulatory Visit: Payer: No Typology Code available for payment source | Admitting: Internal Medicine

## 2015-02-13 ENCOUNTER — Encounter: Payer: Self-pay | Admitting: Internal Medicine

## 2015-02-13 ENCOUNTER — Ambulatory Visit: Payer: No Typology Code available for payment source | Attending: Internal Medicine | Admitting: Internal Medicine

## 2015-02-13 VITALS — BP 109/76 | HR 84 | Temp 98.7°F | Resp 16 | Ht 62.0 in | Wt 190.0 lb

## 2015-02-13 DIAGNOSIS — R21 Rash and other nonspecific skin eruption: Secondary | ICD-10-CM

## 2015-02-13 DIAGNOSIS — K029 Dental caries, unspecified: Secondary | ICD-10-CM

## 2015-02-13 DIAGNOSIS — L299 Pruritus, unspecified: Secondary | ICD-10-CM

## 2015-02-13 DIAGNOSIS — R252 Cramp and spasm: Secondary | ICD-10-CM

## 2015-02-13 MED ORDER — CYCLOBENZAPRINE HCL 10 MG PO TABS: 10.0000 mg | ORAL_TABLET | Freq: Every day | ORAL | Status: DC

## 2015-02-13 MED ORDER — METHYLPREDNISOLONE 4 MG PO TBPK: ORAL_TABLET | ORAL | Status: DC

## 2015-02-13 MED ORDER — CETIRIZINE HCL 10 MG PO TABS: 10.0000 mg | ORAL_TABLET | Freq: Every day | ORAL | Status: DC

## 2015-02-13 MED ORDER — HYDROCORTISONE 2.5 % EX CREA: TOPICAL_CREAM | Freq: Two times a day (BID) | CUTANEOUS | Status: DC

## 2015-02-13 NOTE — Progress Notes (Signed)
MRN: [id number] Name: Alexandra Aguirre  Sex: female Age: 39 y.o. DOB: 09/19/1976  Allergies: Fruit & vegetable daily  Chief Complaint  Patient presents with  . Follow-up  . Rash    HPI: Patient is 39 y.o. female who comes today complaining of itchy rash on her both to hands and arms, she was seen in by nurse practitioner and was thought to be secondary to continued otitis, she was prescribed 0.5% hydrocortisone, she reports some improvement but still has the symptoms denies any fever chills chest and shortness of breath, she is also requesting referral to see a dentist.patient is also complaining of leg cramps.  Past Medical History  Diagnosis Date  . GERD (gastroesophageal reflux disease)   . Asthma   . Abnormal Pap smear of cervix 09/24/2013    AGUS    Past Surgical History  Procedure Laterality Date  . Cervical biopsy  w/ loop electrode excision    . Removal of cyst Right 2011      Medication List       This list is accurate as of: 02/13/15 12:52 PM.  Always use your most recent med list.               albuterol 108 (90 BASE) MCG/ACT inhaler  Commonly known as:  PROVENTIL HFA;VENTOLIN HFA  Inhale 2 puffs into the lungs every 6 (six) hours as needed for wheezing or shortness of breath.     amoxicillin-clavulanate 875-125 MG per tablet  Commonly known as:  AUGMENTIN  Take 1 tablet by mouth 2 (two) times daily.     aspirin-acetaminophen-caffeine 250-250-65 MG per tablet  Commonly known as:  EXCEDRIN MIGRAINE  Take 1 tablet by mouth as needed for headache or migraine.     beclomethasone 40 MCG/ACT inhaler  Commonly known as:  QVAR  Inhale 2 puffs into the lungs 2 (two) times daily.     bismuth subsalicylate 262 MG/15ML suspension  Commonly known as:  PEPTO BISMOL  Take 30 mLs by mouth 2 (two) times daily as needed for indigestion or diarrhea or loose stools.     bismuth subsalicylate 262 MG chewable tablet  Commonly known as:  PEPTO BISMOL  Chew 524 mg by  mouth as needed for indigestion or diarrhea or loose stools.     cetirizine 10 MG tablet  Commonly known as:  ZYRTEC  Take 1 tablet (10 mg total) by mouth daily.     cyclobenzaprine 10 MG tablet  Commonly known as:  FLEXERIL  Take 1 tablet (10 mg total) by mouth at bedtime.     dexamethasone 6 MG tablet  Commonly known as:  DECADRON  Take 12 mg by mouth once.     hydrocortisone 2.5 % cream  Apply topically 2 (two) times daily.     ibuprofen 600 MG tablet  Commonly known as:  ADVIL,MOTRIN  Take 1 tablet (600 mg total) by mouth every 6 (six) hours as needed for moderate pain.     meclizine 25 MG tablet  Commonly known as:  ANTIVERT  Take 1 tablet (25 mg total) by mouth 3 (three) times daily as needed for dizziness.     methylPREDNISolone 4 MG Tbpk tablet  Commonly known as:  MEDROL DOSEPAK  follow package directions     montelukast 5 MG chewable tablet  Commonly known as:  SINGULAIR  Chew 10 mg by mouth once.     MULTIVITAMIN PO  Take 1 tablet by mouth daily.     naproxen  500 MG tablet  Commonly known as:  NAPROSYN  Take 1 tablet (500 mg total) by mouth 2 (two) times daily with a meal.     ondansetron 4 MG tablet  Commonly known as:  ZOFRAN  Take 1 tablet (4 mg total) by mouth every 6 (six) hours.     oxyCODONE-acetaminophen 5-325 MG per tablet  Commonly known as:  PERCOCET  Take 1 tablet by mouth every 4 (four) hours as needed for severe pain.     predniSONE 20 MG tablet  Commonly known as:  DELTASONE  Take 1 tablet (20 mg total) by mouth 2 (two) times daily with a meal.     ranitidine 150 MG capsule  Commonly known as:  ZANTAC  Take 1 capsule (150 mg total) by mouth daily.     sodium-potassium bicarbonate Tbef dissolvable tablet  Commonly known as:  ALKA-SELTZER GOLD  Take 2 tablets by mouth 2 (two) times daily as needed (for indigestion).        Meds ordered this encounter  Medications  . hydrocortisone 2.5 % cream    Sig: Apply topically 2 (two)  times daily.    Dispense:  30 g    Refill:  1  . methylPREDNISolone (MEDROL DOSEPAK) 4 MG TBPK tablet    Sig: follow package directions    Dispense:  21 tablet    Refill:  0  . cetirizine (ZYRTEC) 10 MG tablet    Sig: Take 1 tablet (10 mg total) by mouth daily.    Dispense:  30 tablet    Refill:  3  . cyclobenzaprine (FLEXERIL) 10 MG tablet    Sig: Take 1 tablet (10 mg total) by mouth at bedtime.    Dispense:  30 tablet    Refill:  1    Immunization History  Administered Date(s) Administered  . Influenza,inj,Quad PF,36+ Mos 08/29/2013, 10/15/2014, 12/17/2014  . Tdap 03/12/2013    Family History  Problem Relation Age of Onset  . Cancer Mother     cervical/ovarian  . Diabetes Brother   . Diabetes Maternal Uncle   . Diabetes Maternal Uncle   . Diabetes Maternal Uncle     History  Substance Use Topics  . Smoking status: Never Smoker   . Smokeless tobacco: Never Used  . Alcohol Use: No    Review of Systems   As noted in HPI  Filed Vitals:   02/13/15 0914  BP: 109/76  Pulse: 84  Temp: 98.7 F (37.1 C)  Resp: 16    Physical Exam  Physical Exam  Constitutional: No distress.  HENT:  Dental cavities  Eyes: EOM are normal. Pupils are equal, round, and reactive to light.  Cardiovascular: Normal rate and regular rhythm.   Pulmonary/Chest: Breath sounds normal. No respiratory distress. She has no wheezes. She has no rales.  Musculoskeletal:  Macular /papular rash noted on both arms and hands    CBC    Component Value Date/Time   WBC 15.6* 12/10/2014 2025   RBC 4.62 12/10/2014 2025   HGB 14.6 12/10/2014 2037   HCT 43.0 12/10/2014 2037   PLT 442* 12/10/2014 2025   MCV 86.6 12/10/2014 2025   LYMPHSABS 1.6 12/10/2014 2025   MONOABS 0.5 12/10/2014 2025   EOSABS 0.0 12/10/2014 2025   BASOSABS 0.0 12/10/2014 2025    CMP     Component Value Date/Time   NA 137 12/10/2014 2037   K 4.2 12/10/2014 2037   CL 103 12/10/2014 2037   CO2 29 10/17/2014 1850  GLUCOSE 155* 12/10/2014 2037   BUN 11 12/10/2014 2037   CREATININE 0.40* 12/10/2014 2037   CREATININE 0.49* 02/18/2014 0959   CALCIUM 8.7 10/17/2014 1850   PROT 8.4* 12/10/2014 2145   ALBUMIN 3.9 12/10/2014 2145   AST 34 12/10/2014 2145   ALT 27 12/10/2014 2145   ALKPHOS 129* 12/10/2014 2145   BILITOT 0.9 12/10/2014 2145   GFRNONAA >90 10/17/2014 1850   GFRNONAA >89 02/18/2014 0959   GFRAA >90 10/17/2014 1850   GFRAA >89 02/18/2014 0959    No results found for: CHOL  Lab Results  Component Value Date/Time   HGBA1C 6.3* 02/18/2014 09:59 AM   HGBA1C 5.9 09/30/2013 05:53 PM    Lab Results  Component Value Date/Time   AST 34 12/10/2014 09:45 PM    Assessment and Plan  Rash and nonspecific skin eruption - Plan: hydrocortisone 2.5 % cream, methylPREDNISolone (MEDROL DOSEPAK) 4 MG TBPK tablet, if symptomatically not improved consider referral to dermatology.  Dental cavities - Plan: Ambulatory referral to Dentistry  Itching - Plan: cetirizine (ZYRTEC) 10 MG tablet  Cramps of lower extremity, unspecified laterality - Plan: cyclobenzaprine (FLEXERIL) 10 MG tablet   Return in about 3 months (around 05/16/2015), or if symptoms worsen or fail to improve.   This note has been created with Education officer, environmental. Any transcriptional errors are unintentional.    Doris Cheadle, MD

## 2015-02-13 NOTE — Progress Notes (Signed)
F/U Rash Stated medication helping with itching, spot still same as last visit Requesting Dental referral

## 2015-04-09 ENCOUNTER — Other Ambulatory Visit: Payer: Self-pay | Admitting: Internal Medicine

## 2015-06-01 ENCOUNTER — Ambulatory Visit: Payer: No Typology Code available for payment source

## 2015-11-07 ENCOUNTER — Encounter (HOSPITAL_COMMUNITY): Payer: Self-pay | Admitting: *Deleted

## 2015-11-07 ENCOUNTER — Emergency Department (HOSPITAL_COMMUNITY): Payer: Self-pay

## 2015-11-07 ENCOUNTER — Emergency Department (HOSPITAL_COMMUNITY)
Admission: EM | Admit: 2015-11-07 | Discharge: 2015-11-07 | Disposition: A | Discharge status: Home / Self Care | Payer: Self-pay | Attending: Emergency Medicine | Admitting: Emergency Medicine

## 2015-11-07 DIAGNOSIS — R51 Headache: Secondary | ICD-10-CM | POA: Insufficient documentation

## 2015-11-07 DIAGNOSIS — J45909 Unspecified asthma, uncomplicated: Secondary | ICD-10-CM | POA: Insufficient documentation

## 2015-11-07 DIAGNOSIS — Z880 Allergy status to penicillin: Secondary | ICD-10-CM | POA: Insufficient documentation

## 2015-11-07 DIAGNOSIS — R519 Headache, unspecified: Secondary | ICD-10-CM

## 2015-11-07 DIAGNOSIS — K219 Gastro-esophageal reflux disease without esophagitis: Secondary | ICD-10-CM | POA: Insufficient documentation

## 2015-11-07 DIAGNOSIS — M79672 Pain in left foot: Secondary | ICD-10-CM | POA: Insufficient documentation

## 2015-11-07 DIAGNOSIS — Z79899 Other long term (current) drug therapy: Secondary | ICD-10-CM | POA: Insufficient documentation

## 2015-11-07 DIAGNOSIS — R42 Dizziness and giddiness: Secondary | ICD-10-CM | POA: Insufficient documentation

## 2015-11-07 MED ORDER — METOCLOPRAMIDE HCL 5 MG/ML IJ SOLN
10.0000 mg | Freq: Once | INTRAMUSCULAR | Status: AC
  Administered 2015-11-07: 10 mg via INTRAVENOUS
  Filled 2015-11-07: qty 2

## 2015-11-07 MED ORDER — DEXAMETHASONE SODIUM PHOSPHATE 10 MG/ML IJ SOLN
10.0000 mg | Freq: Once | INTRAMUSCULAR | Status: AC
Start: 2015-11-07 — End: 2015-11-07
  Administered 2015-11-07: 10 mg via INTRAVENOUS
  Filled 2015-11-07: qty 1

## 2015-11-07 MED ORDER — KETOROLAC TROMETHAMINE 30 MG/ML IJ SOLN
30.0000 mg | Freq: Once | INTRAMUSCULAR | Status: AC
  Administered 2015-11-07: 30 mg via INTRAVENOUS
  Filled 2015-11-07: qty 1

## 2015-11-07 MED ORDER — DIPHENHYDRAMINE HCL 50 MG/ML IJ SOLN
25.0000 mg | Freq: Once | INTRAMUSCULAR | Status: AC
  Administered 2015-11-07: 25 mg via INTRAVENOUS
  Filled 2015-11-07: qty 1

## 2015-11-07 MED ORDER — SODIUM CHLORIDE 0.9 % IV BOLUS (SEPSIS)
1000.0000 mL | Freq: Once | INTRAVENOUS | Status: AC
  Administered 2015-11-07: 1000 mL via INTRAVENOUS

## 2015-11-07 NOTE — ED Notes (Signed)
Pt alert family at the bedside.  Water given

## 2015-11-07 NOTE — Discharge Instructions (Signed)
May continue taking your voltaren as needed.  May also want to take excedrin migraine if headache recurs. Follow-up with your primary care physician. May also follow-up with orthopedics if you continue having issues with your left foot. Return to the ED for new or worsening symptoms.

## 2015-11-07 NOTE — ED Provider Notes (Signed)
CSN: [id number]     Arrival date & time 11/07/15  1018 History   First MD Initiated Contact with Patient 11/07/15 1038     Chief Complaint  Patient presents with  . Dizziness  . Headache  . Foot Pain     (Consider location/radiation/quality/duration/timing/severity/associated sxs/prior Treatment) Patient is a 40 y.o. female presenting with dizziness, headaches, and lower extremity pain. The history is provided by the patient and medical records. The history is limited by a language barrier. A language interpreter was used.  Dizziness Associated symptoms: headaches   Headache Foot Pain Associated symptoms include arthralgias and headaches.    40 year old female with history of GERD, asthma, migraine headaches, presenting to the ED for persistent headache and dizziness for the past 6 days.  Patient is spanish speaking, history obtained via language interpreter, Alexandra Aguirre (ID [phone]).  Patient states initially when headache began 6 days ago it was mild and localized to her forehead. She states now headache has progressed and is generalized throughout her entire head. She reports headache is throbbing in nature. She states her dizziness is worse with movement, only seems to occur since she has had her headache so thinks it is related to that. She denies any falls or syncopal events. No chest pain or shortness of breath.  No abdominal pain, nausea, vomiting, diaphoresis, numbness, weakness, confusion, changes in speech, or difficulty walking.  No fever, chills, or neck pain.  No recent illness. Patient states she was diagnosed with migraine headaches approx 10 years ago, no real issues until now.  Patient also complains of persistent left foot pain. Patient fell in 2016 and injured her left foot-- no fracture or surgery but was told she injured the nerves. She was placed in an ASO brace and given follow-up with PT but states this is not improving.  Denies numbness/weakness of foot.  No new injuries or  trauma noted.  Has been taking voltaren at home without relief.  Past Medical History  Diagnosis Date  . GERD (gastroesophageal reflux disease)   . Asthma   . Abnormal Pap smear of cervix 09/24/2013    AGUS   Past Surgical History  Procedure Laterality Date  . Cervical biopsy  w/ loop electrode excision    . Removal of cyst Right 2011   Family History  Problem Relation Age of Onset  . Cancer Mother     cervical/ovarian  . Diabetes Brother   . Diabetes Maternal Uncle   . Diabetes Maternal Uncle   . Diabetes Maternal Uncle    Social History  Substance Use Topics  . Smoking status: Never Smoker   . Smokeless tobacco: Never Used  . Alcohol Use: No   OB History    Gravida Para Term Preterm AB TAB SAB Ectopic Multiple Living   Review of Systems  Musculoskeletal: Positive for arthralgias.  Neurological: Positive for headaches.  All other systems reviewed and are negative.     Allergies  Fruit & vegetable daily  Home Medications   Prior to Admission medications   Medication Sig Start Date End Date Taking? Authorizing Provider  albuterol (PROVENTIL HFA;VENTOLIN HFA) 108 (90 BASE) MCG/ACT inhaler Inhale 2 puffs into the lungs every 6 (six) hours as needed for wheezing or shortness of breath. 01/14/14  Yes Carney Living, MD  aspirin-acetaminophen-caffeine (EXCEDRIN MIGRAINE) [phone] MG per tablet Take 1 tablet by mouth as needed for headache or migraine.   Yes  Historical Provider, MD  bismuth subsalicylate (PEPTO BISMOL) 262 MG chewable tablet Chew 524 mg by mouth as needed for indigestion or diarrhea or loose stools.   Yes Historical Provider, MD  bismuth subsalicylate (PEPTO BISMOL) 262 MG/15ML suspension Take 30 mLs by mouth 2 (two) times daily as needed for indigestion or diarrhea or loose stools.   Yes Historical Provider, MD  Multiple Vitamins-Minerals (MULTIVITAMIN PO) Take 1 tablet by mouth daily.   Yes Historical Provider, MD  ranitidine  (ZANTAC) 150 MG capsule Take 1 capsule (150 mg total) by mouth daily. 12/17/14  Yes Doris Cheadle, MD  cetirizine (ZYRTEC) 10 MG tablet Take 1 tablet (10 mg total) by mouth daily. 02/13/15   Doris Cheadle, MD  montelukast (SINGULAIR) 5 MG chewable tablet Chew 10 mg by mouth once.    Historical Provider, MD   BP 120/68 mmHg  Pulse 94  Temp(Src) 98.7 F (37.1 C) (Oral)  Resp 16  SpO2 99%  LMP 10/28/2015 (Exact Date)   Physical Exam  Constitutional: She is oriented to person, place, and time. She appears well-developed and well-nourished. No distress.  HENT:  Head: Normocephalic and atraumatic.  Mouth/Throat: Oropharynx is clear and moist.  Eyes: Conjunctivae and EOM are normal. Pupils are equal, round, and reactive to light.  Neck: Normal range of motion and full passive range of motion without pain. No spinous process tenderness and no muscular tenderness present. No rigidity.  Full ROM, no meningismus  Cardiovascular: Normal rate, regular rhythm and normal heart sounds.   Pulmonary/Chest: Effort normal and breath sounds normal.  Abdominal: Soft. Bowel sounds are normal. There is no tenderness. There is no guarding.  Musculoskeletal: Normal range of motion. She exhibits no edema.       Left foot: There is tenderness and bony tenderness. There is no swelling, no crepitus and no deformity.       Feet:  Left foot with tenderness along medial aspect but does not extend into arch of foot; no swelling, deformities, or overlying skin changes noted; pain with flexion of foot, no pain in extension; DP pulse intact; normal sensation throughout foot, well perfused  Neurological: She is alert and oriented to person, place, and time.  AAOx3, answering questions appropriately; equal strength UE and LE bilaterally; CN grossly intact; moves all extremities appropriately without ataxia; no focal neuro deficits or facial asymmetry appreciated  Skin: Skin is warm and dry. She is not diaphoretic.    Psychiatric: She has a normal mood and affect.  Nursing note and vitals reviewed.   ED Course  Procedures (including critical care time) Labs Review Labs Reviewed - No data to display  Imaging Review Dg Foot Complete Left  11/07/2015  CLINICAL DATA:  Previous tripping injury, persistent foot pain radiating to the ankle. EXAM: LEFT FOOT - COMPLETE 3+ VIEW COMPARISON:  07/03/2011 FINDINGS: There is no evidence of fracture or dislocation. There is no evidence of arthropathy or other focal bone abnormality. Soft tissues are unremarkable. IMPRESSION: No acute osseous finding.  Stable exam. Electronically Signed   By: Judie Petit.  Shick M.D.   On: 11/07/2015 12:37   I have personally reviewed and evaluated these images and lab results as part of my medical decision-making.   EKG Interpretation None      MDM   Final diagnoses:  Headache, unspecified headache type  Left foot pain   40 year old female here with progressively worsening headache over the past 6 days as well as somewhat chronic left foot pain from accident in 2016.  Patient is afebrile, nontoxic. Her neurologic exam is nonfocal. She has no clinical manifestations concerning for meningitis at this time. She does have history of migraines , suspicious for the same. Patient  Will be treated with migraine cocktail including Decadron, Benadryl, Reglan, and IV fluids. Left foot pain normal in appearance, no new injuries reported.  Will obtain x-ray.  2:35 PM Patient reassessed.  States she is feeling better.  Dizziness resolved, still has slight headache in her occipital region.  She has gotten out of bed and ambulated to the bathroom without difficulty.  She requests additional medications prior to discharge, Toradol given.  X-ray left foot negative, results discussed with patient.  Will send to orthopedics for further management.  3:25 PM Patient  Continuing to feel better after Toradol. She is tolerating oral fluids.  She remains  neurologically intact. Continue to have low suspicion for intracranial pathology including ICH, SAH, TIA, CVA, or meningitis.  Patient appears stable for discharge.   In regards to her foot, this pain is chronic. She does not have any evidence of acute fracture today. Her foot is noninfectious in appearance. I recommended that she follow-up with orthopedics if she continues to have ongoing symptoms.  Discussed plan with patient, he/she acknowledged understanding and agreed with plan of care.  Return precautions given for new or worsening symptoms.  Garlon Hatchet, PA-C 11/07/15 1530  Lyndal Pulley, MD 11/09/15 [phone]

## 2015-11-07 NOTE — ED Notes (Signed)
Pt ambulatory to restroom without assistance 

## 2015-11-07 NOTE — ED Notes (Signed)
Pt is spanish speaking only. Via telephone interpreter - Pt reports headache and dizziness x5-6 days which has gotten progressively worse w/ associated "burning eyes" - pt denies n/v/d, recent illness or fever - pt admits "I feel like my head is spinning." Pt also reports she fell in August 2016 and injured her left foot, pt arrives w/ ASO on, and c/o worsening left foot pain, has been taking rx for voltaren w/o relief. Pt A&Ox4, no acute distress.

## 2015-11-07 NOTE — ED Notes (Signed)
Patient transported to X-ray 

## 2016-03-11 ENCOUNTER — Emergency Department (HOSPITAL_COMMUNITY): Payer: Self-pay

## 2016-03-11 ENCOUNTER — Encounter (HOSPITAL_COMMUNITY): Payer: Self-pay | Admitting: Emergency Medicine

## 2016-03-11 ENCOUNTER — Other Ambulatory Visit: Payer: Self-pay

## 2016-03-11 DIAGNOSIS — J45909 Unspecified asthma, uncomplicated: Secondary | ICD-10-CM | POA: Insufficient documentation

## 2016-03-11 DIAGNOSIS — E119 Type 2 diabetes mellitus without complications: Secondary | ICD-10-CM | POA: Insufficient documentation

## 2016-03-11 DIAGNOSIS — R079 Chest pain, unspecified: Secondary | ICD-10-CM | POA: Insufficient documentation

## 2016-03-11 DIAGNOSIS — K219 Gastro-esophageal reflux disease without esophagitis: Secondary | ICD-10-CM | POA: Insufficient documentation

## 2016-03-11 DIAGNOSIS — Z792 Long term (current) use of antibiotics: Secondary | ICD-10-CM | POA: Insufficient documentation

## 2016-03-11 LAB — BASIC METABOLIC PANEL
Anion gap: 8 (ref 5–15)
BUN: 9 mg/dL (ref 6–20)
CALCIUM: 9.3 mg/dL (ref 8.9–10.3)
CHLORIDE: 100 mmol/L — AB (ref 101–111)
CO2: 25 mmol/L (ref 22–32)
CREATININE: 0.73 mg/dL (ref 0.44–1.00)
GFR calc non Af Amer: >60 mL/min (ref >60)
Glucose, Bld: 340 mg/dL — ABNORMAL HIGH (ref 65–99)
Potassium: 3.9 mmol/L (ref 3.5–5.1)
SODIUM: 133 mmol/L — AB (ref 135–145)

## 2016-03-11 LAB — CBC
HCT: 40.9 % (ref 36.0–46.0)
Hemoglobin: 13.7 g/dL (ref 12.0–15.0)
MCH: 28.6 pg (ref 26.0–34.0)
MCHC: 33.5 g/dL (ref 30.0–36.0)
MCV: 85.4 fL (ref 78.0–100.0)
PLATELETS: 394 10*3/uL (ref 150–400)
RBC: 4.79 MIL/uL (ref 3.87–5.11)
RDW: 12.7 % (ref 11.5–15.5)
WBC: 11.5 10*3/uL — AB (ref 4.0–10.5)

## 2016-03-11 LAB — I-STAT TROPONIN, ED: TROPONIN I, POC: 0 ng/mL (ref 0.00–0.08)

## 2016-03-11 NOTE — ED Notes (Addendum)
Pt here with CP for 3 weeks. Pt reports today the pain got worse. Pt sts radiation around left breast to back. Pt denies SOB, cough. Pain worse with palpation. Pt endorsing fatigue. Pt also reports cramps in fingers for 3 weeks. Pt reports HA x 1 week.

## 2016-03-12 ENCOUNTER — Emergency Department (HOSPITAL_COMMUNITY)
Admission: EM | Admit: 2016-03-12 | Discharge: 2016-03-12 | Disposition: A | Discharge status: Home / Self Care | Payer: Self-pay | Attending: Emergency Medicine | Admitting: Emergency Medicine

## 2016-03-12 DIAGNOSIS — E119 Type 2 diabetes mellitus without complications: Secondary | ICD-10-CM

## 2016-03-12 DIAGNOSIS — R51 Headache: Secondary | ICD-10-CM

## 2016-03-12 DIAGNOSIS — R519 Headache, unspecified: Secondary | ICD-10-CM

## 2016-03-12 DIAGNOSIS — R1013 Epigastric pain: Secondary | ICD-10-CM

## 2016-03-12 LAB — CBG MONITORING, ED: GLUCOSE-CAPILLARY: 277 mg/dL — AB (ref 65–99)

## 2016-03-12 MED ORDER — OMEPRAZOLE 20 MG PO CPDR: DELAYED_RELEASE_CAPSULE | ORAL | Status: DC

## 2016-03-12 MED ORDER — METFORMIN HCL 500 MG PO TABS: ORAL_TABLET | ORAL | Status: DC

## 2016-03-12 MED ORDER — GI COCKTAIL ~~LOC~~
30.0000 mL | Freq: Once | ORAL | Status: AC
  Administered 2016-03-12: 30 mL via ORAL
  Filled 2016-03-12: qty 30

## 2016-03-12 MED ORDER — HYDROCODONE-ACETAMINOPHEN 5-325 MG PO TABS
1.0000 | ORAL_TABLET | Freq: Once | ORAL | Status: AC
  Administered 2016-03-12: 1 via ORAL
  Filled 2016-03-12: qty 1

## 2016-03-12 MED ORDER — ACETAMINOPHEN 500 MG PO TABS
1000.0000 mg | ORAL_TABLET | Freq: Once | ORAL | Status: AC
  Administered 2016-03-12: 1000 mg via ORAL
  Filled 2016-03-12: qty 2

## 2016-03-12 NOTE — ED Provider Notes (Signed)
CSN: [id number]     Arrival date & time 03/11/16  2026 History   First MD Initiated Contact with Patient 03/12/16 0146     Chief Complaint  Patient presents with  . Chest Pain     (Consider location/radiation/quality/duration/timing/severity/associated sxs/prior Treatment) HPI Comments: Patient with a history of asthma, GERD presents with left sided chest pain for the past 3 weeks that is constant but fluctuates in severity. No aggravating or alleviating factors. She reports that when the pain is severe it takes her breath away and causes nausea. No cough or fever. She reports a history of "stomach ulcer", no history of EGD, and is taking Zantac which does not alleviate her stomach pain.   Patient is a 40 y.o. female presenting with chest pain. The history is provided by the patient. A language interpreter was used Garment/textile technologist(Interpreter via [employer]).  Chest Pain Pain location:  L chest Pain quality: radiating and sharp   Pain radiates to:  L shoulder Pain radiates to the back: yes   Pain severity:  Moderate Onset quality:  Gradual Duration:  3 weeks Timing:  Constant Progression:  Waxing and waning Relieved by:  Nothing Worsened by:  Nothing tried Ineffective treatments:  None tried Associated symptoms: shortness of breath   Associated symptoms: no abdominal pain, no cough, no fever, no nausea, not vomiting and no weakness     Past Medical History  Diagnosis Date  . GERD (gastroesophageal reflux disease)   . Asthma   . Abnormal Pap smear of cervix 09/24/2013    AGUS   Past Surgical History  Procedure Laterality Date  . Cervical biopsy  w/ loop electrode excision    . Removal of cyst Right 2011   Family History  Problem Relation Age of Onset  . Cancer Mother     cervical/ovarian  . Diabetes Brother   . Diabetes Maternal Uncle   . Diabetes Maternal Uncle   . Diabetes Maternal Uncle    Social History  Substance Use Topics  . Smoking status: Never Smoker   .  Smokeless tobacco: Never Used  . Alcohol Use: No   OB History    Gravida Para Term Preterm AB TAB SAB Ectopic Multiple Living   5 5 5       5      Review of Systems  Constitutional: Negative for fever and chills.  Respiratory: Positive for shortness of breath. Negative for cough.   Cardiovascular: Positive for chest pain. Negative for leg swelling.  Gastrointestinal: Negative.  Negative for nausea, vomiting and abdominal pain.  Endocrine: Positive for polydipsia.  Genitourinary: Positive for frequency.  Musculoskeletal: Negative.   Neurological: Negative.  Negative for weakness and light-headedness.      Allergies  Fruit & vegetable daily  Home Medications   Prior to Admission medications   Medication Sig Start Date End Date Taking? Authorizing Provider  albuterol (PROVENTIL HFA;VENTOLIN HFA) 108 (90 BASE) MCG/ACT inhaler Inhale 2 puffs into the lungs every 6 (six) hours as needed for wheezing or shortness of breath. 01/14/14   Carney LivingMarshall L Chambliss, MD  aspirin-acetaminophen-caffeine (EXCEDRIN MIGRAINE) [phone] MG per tablet Take 1 tablet by mouth as needed for headache or migraine.    Historical Provider, MD  bismuth subsalicylate (PEPTO BISMOL) 262 MG chewable tablet Chew 524 mg by mouth as needed for indigestion or diarrhea or loose stools.    Historical Provider, MD  bismuth subsalicylate (PEPTO BISMOL) 262 MG/15ML suspension Take 30 mLs by mouth 2 (two) times daily as  needed for indigestion or diarrhea or loose stools.    Historical Provider, MD  cetirizine (ZYRTEC) 10 MG tablet Take 1 tablet (10 mg total) by mouth daily. 02/13/15   Doris Cheadleeepak Advani, MD  montelukast (SINGULAIR) 5 MG chewable tablet Chew 10 mg by mouth once.    Historical Provider, MD  Multiple Vitamins-Minerals (MULTIVITAMIN PO) Take 1 tablet by mouth daily.    Historical Provider, MD  ranitidine (ZANTAC) 150 MG capsule Take 1 capsule (150 mg total) by mouth daily. 12/17/14   Doris Cheadleeepak Advani, MD   BP 115/80 mmHg   Pulse 98  Temp(Src) 98.1 F (36.7 C) (Oral)  Resp 18  SpO2 98%  LMP 03/07/2016 Physical Exam  Constitutional: She is oriented to person, place, and time. She appears well-developed and well-nourished.  HENT:  Head: Normocephalic.  Neck: Normal range of motion. Neck supple.  Cardiovascular: Normal rate and regular rhythm.   No murmur heard. Pulmonary/Chest: Effort normal and breath sounds normal. She has no wheezes. She has no rales. She exhibits no tenderness.  Abdominal: Soft. Bowel sounds are normal. There is no tenderness. There is no rebound and no guarding.  Musculoskeletal: Normal range of motion. She exhibits no edema.  Neurological: She is alert and oriented to person, place, and time.  Skin: Skin is warm and dry. No rash noted.  Psychiatric: She has a normal mood and affect.    ED Course  Procedures (including critical care time) Labs Review Labs Reviewed  BASIC METABOLIC PANEL - Abnormal; Notable for the following:    Sodium 133 (*)    Chloride 100 (*)    Glucose, Bld 340 (*)    All other components within normal limits  CBC - Abnormal; Notable for the following:    WBC 11.5 (*)    All other components within normal limits  I-STAT TROPOININ, ED  CBG MONITORING, ED   Results for orders placed or performed during the hospital encounter of 03/12/16  Basic metabolic panel  Result Value Ref Range   Sodium 133 (L) 135 - 145 mmol/L   Potassium 3.9 3.5 - 5.1 mmol/L   Chloride 100 (L) 101 - 111 mmol/L   CO2 25 22 - 32 mmol/L   Glucose, Bld 340 (H) 65 - 99 mg/dL   BUN 9 6 - 20 mg/dL   Creatinine, Ser 7.820.73 0.44 - 1.00 mg/dL   Calcium 9.3 8.9 - 95.610.3 mg/dL   GFR calc non Af Amer >60 >60 mL/min   GFR calc Af Amer >60 >60 mL/min   Anion gap 8 5 - 15  CBC  Result Value Ref Range   WBC 11.5 (H) 4.0 - 10.5 K/uL   RBC 4.79 3.87 - 5.11 MIL/uL   Hemoglobin 13.7 12.0 - 15.0 g/dL   HCT 21.340.9 08.636.0 - 57.846.0 %   MCV 85.4 78.0 - 100.0 fL   MCH 28.6 26.0 - 34.0 pg   MCHC 33.5  30.0 - 36.0 g/dL   RDW 46.912.7 62.911.5 - 52.815.5 %   Platelets 394 150 - 400 K/uL  I-stat troponin, ED  Result Value Ref Range   Troponin i, poc 0.00 0.00 - 0.08 ng/mL   Comment 3             Imaging Review Dg Chest 2 View  03/11/2016  CLINICAL DATA:  Chest pain and fatigue EXAM: CHEST  2 VIEW COMPARISON:  10/17/2014 FINDINGS: Normal cardiac silhouette. New opacity LEFT RIGHT lung base. No focal consolidation. No pneumothorax or pulmonary edema. IMPRESSION: Linear  lung base opacities suggest bronchitis. Electronically Signed   By: Genevive Bi M.D.   On: 03/11/2016 21:23   I have personally reviewed and evaluated these images and lab results as part of my medical decision-making.   EKG Interpretation None      MDM   Final diagnoses:  None    1. Chest pain 2. GERD 3. New onset DM type 2  Patient presents with recurrent chest pain, starting on right lower and extending to left upper chest. Labs are reassuring, troponin and EKG negative for indication of ischemia. Doubt ACS. She does have an elevated blood sugar dictating new onset diabetes. The patient reports multiple family members with DM with poor outcomes and is upset with Dx. Reassured that well controlled DM was possible and encouraged follow up with PCP. Patient will be started on Metformin and Prilosec. Stable for discharge.   Elpidio Anis, PA-C 03/18/16 2018  Layla Maw Ward, DO 03/20/16 0003

## 2016-03-12 NOTE — Discharge Instructions (Signed)
Cmo evitar los problemas relacionados con la diabetes (How to Avoid Diabetes Problems) Usted puede hacer varias cosas para prevenir o disminuir los problemas relacionados con la diabetes. Seguir un plan para la diabetes y cuidarse usted mismo puede reducir el riesgo de complicaciones graves o potencialmente mortales. A continuacin, encontrar algunas cosas que puede hacer para prevenir los problemas de la diabetes. CONTROLE LA DIABETES Siga las instrucciones de su mdico, enfermera educadora en diabetes y nutricionista para Alexandra Aguirre, Alexandra Aguirre. Le ensearn los fundamentos para el cuidado de la diabetes. Le ayudar con las preguntas que pueda tener. Aprenda acerca de la diabetes y a tomar decisiones saludables en materia de alimentacin y Alexandra Aguirre fsica. Controle su nivel de glucosa en la sangre con regularidad. El Alexandra Aguirre a decidir con qu frecuencia debe revisar su nivel de glucosa en la sangre, en funcin de los objetivos de su tratamiento y el xito en cumplirlos.  NO USE NICOTINA La nicotina y la diabetes son Alexandra Aguirre combinacin peligrosa. La nicotina aumenta el riesgo de problemas con la diabetes. Si deja de Alexandra Aguirre, reducir el riesgo de infarto de miocardio, ictus, enfermedades del sistema nervioso y enfermedades renales. Pueden mejorar el colesterol y sus niveles de presin arterial. La circulacin sangunea mejorar tambin. No consuma ningn producto que contenga tabaco, incluidos cigarrillos, tabaco de Alexandra Aguirre o cigarrillos electrnicos. Si necesita ayuda para dejar de fumar, hable con el mdico. MANTENGA SU PRESIN ARTERIAL BAJO CONTROL El mdico determinar cul debera ser su presin arterial en funcin de su edad, los medicamentos que Alexandra Aguirre, el tiempo que hace que tiene diabetes y cualquier otra Aguirre que padezca. La presin arterial consiste en dos nmeros. En general, el objetivo es mantener el nmero de Alexandra Aguirre (presin sistlica) en un valor mximo de 130 y el  nmero de abajo (presin diastlica) en un valor mximo de 80. Si corresponde, el mdico recomendar un objetivo de presin arterial con valores ms bajos. La planificacin de comidas, los medicamentos y el ejercicio pueden ayudarle a Alexandra Aguirre sus objetivos. Asegrese de que el mdico le mida la presin arterial en cada visita. MANTENGA LOS NIVELES DE COLESTEROL BAJO CONTROL Los niveles normales de colesterol ayudan a prevenir enfermedades del corazn y el ictus. Estos son los Alexandra Aguirre de salud para las personas con diabetes. Mantener los niveles de colesterol bajo control tambin puede ayudar con el flujo sanguneo. Controle su nivel de colesterol por lo menos una vez al ao. Su mdico puede recetarle un medicamento llamado estatina. Las estatinas reducen Alexandra Aguirre. Si no est tomando una estatina, pregntele a su mdico si debera tomarla. La planificacin de comidas, el ejercicio y los medicamentos pueden ayudar a Alexandra Aguirre sus objetivos relacionados con el nivel de colesterol.  PLANIFIQUE Y CUMPLA CON SUS EXAMENES FISICOS Y OCULARES ANUALES El mdico le dir la frecuencia con la que quiere controlarlo en funcin de su plan de tratamiento. Es importante que cumpla con estos controles para identificar rpidamente posibles problemas y puedan evitarse o tratarse las complicaciones.  En cada visita, su mdico debe pesarlo, medirle la presin arterial y Alexandra Aguirre su control del nivel de glucosa.  La hemoglobina A1c debe controlarse:  Por lo Alexandra Aguirre al ao si usted est en el nivel adecuado.  Cada 3 meses si hay cambios en el tratamiento.  Si usted no est alcanzando sus objetivos.  Los lpidos de la sangre deben controlarse anualmente. Tambin hay que controlar anualmente la presencia de protenas en la orina (microalbuminuria).  Si tiene diabetes tipo1,  programe un examen de fondo de ojos en el perodo de 5aos a partir del diagnstico y despus una vez al ao. Si tiene diabetes  tipo2, programe un examen de fondo de ojos cuando reciba el diagnstico y despus una vez al ao. Los exmenes posteriores deben hacerse cada 2 o 3 aos si uno o ms exmenes han sido normales. MANTNGASE AL DA CON LAS VACUNAS Se recomienda que se vacune contra la gripe todos los Aguirre. Adems, que se vacune contra la neumona (vacuna antineumoccica). Si es mayor de 3365 aos y nunca se [name] contra la neumona, esta vacuna puede administrarse como una serie de dos vacunas por separado. Pregntele al mdico qu otras vacunas se pueden recomendar. CUIDE SUS PIES  La diabetes puede hacer que el flujo sanguneo (circulacin) en las piernas y los pies sea deficiente. Debido a esto, la piel se torna ms delgada, se rompe con facilidad y se cura ms lentamente. Tambin puede sufrir un dao en los nervios de las piernas y los pies, lo que disminuye la sensibilidad. Es posible que no advierta las heridas ms pequeas que pueden conducir a infecciones graves. El cuidado de los pies es muy importante. Se har una inspeccin visual en cada visita mdica de rutina. Estos controles observarn si hay cortes, lesiones u otros problemas en los pies. Una vez por ao debe hacerse un examen ms intensivo. Este examen incluye la inspeccin visual y Alexandra Aguirre evaluacin de los pulsos del pie y la sensibilidad. Usted tambin debe hacer lo siguiente:  Examine sus pies todos los [name] para detectar cortes, ampollas, callos, uas encarnadas, y signos de infeccin, tales como enrojecimiento, hinchazn o pus.  Lave y seque bien los pies, Alexandra Foodsespecialmente entre los dedos.  Evite sumergir sus pies regularmente en agua caliente.  Hidrate la piel seca con locin, evitando colocarla Alexandra Krogerentre los dedos.  Crtese las uas en lnea recta y lime los bordes.  Evite los zapatos que no calzan bien o tienen reas que irritan la piel.  Evite ir descalzo o con calcetines solamente. Sus pies necesitan proteccin. CUIDE SUS DIENTES Las personas con  diabetes mal controlada son ms propensas a Immunologisttener enfermedades en las encas (periodontales). Estas infecciones hacen que la diabetes sea difcil de Chief Operating Officercontrolar. Las Schering-Ploughenfermedades periodontales, si se dejan sin tratamiento, pueden conducir a la prdida de dientes. [place] sus RadioShackdientes dos veces al da, use hilo dental y visite al dentista para los controles y limpieza cada 6 meses o 2 veces al ao. CONSULTE A SU MDICO SOBRE EL CONSUMO DE ASPIRINA Tomar aspirina a diario se recomienda para ayudar a prevenir la Aguirre cardiovascular en personas con y sin diabetes. Pregntele a su mdico si esto lo beneficiara y cul es la dosis Wal-Martque le recomienda. BEBA DE MANERA RESPONSABLE Las cantidades moderadas de alcohol (menos de 1 bebida al da para mujeres adultas y menores de 2 bebidas al da para hombres adultos) tienen un mnimo efecto sobre la glucosa en la sangre si se ingiere con los alimentos. Es importante comer alimentos cuando se bebe alcohol para evitar la hipoglucemia. Las Building services engineerpersonas deben evitar el alcohol si tienen un historial de consumo excesivo o dependencia, si es una mujer [place]. [place]bar[place]ada, y si tiene una Aguirre heptica, pancreatitis, neuropata avanzada, o hipertrigliceridemia grave. DISMINUYA EL NIVEL DE ESTRS Vivir con diabetes puede ser estresante. Cuando usted est bajo estrs, el nivel de glucosa en la sangre puede verse afectada de dos maneras:  Las hormonas del estrs pueden hacer que la glucosa en la [place]  se eleve.  Probablemente no se cuid lo suficiente. Es Alexandra Aguirre buena idea estar al tanto del nivel de estrs y Radio producer los cambios que sean necesarios para ayudar a Company secretary mejor las situaciones difciles. Los grupos de apoyo, Manufacturing systems engineer planificada, un pasatiempo que le guste, la [place], las relaciones saludables y Radio producer ejercicio son factores que ayudan a reducir el nivel de Librarian, academic. Si sus esfuerzos no parecen [employer], pdale ayuda a su mdico o a Administrator, sports de  la salud mental capacitado.   Esta informacin no tiene Theme park Aguirre el consejo del mdico. Asegrese de hacerle al mdico cualquier pregunta que tenga.   Document Released: 08/25/2011 Document Revised: 09/26/2014 Elsevier Interactive Patient Education 2016 Alexandra Aguirre. Recuento bsico de carbohidratos para la diabetes mellitus (Basic Carbohydrate Counting for Diabetes Mellitus) El recuento de carbohidratos es un mtodo destinado a calcular la cantidad de carbohidratos en la dieta. El consumo de carbohidratos aumenta naturalmente el nivel de azcar (glucosa) en la sangre, por lo que es importante que sepa la cantidad que debe incluir en cada comida. El recuento de carbohidratos ayuda a Futures trader de glucosa en la sangre dentro de los lmites normales. La cantidad permitida de carbohidratos es diferente para cada persona. Un nutricionista puede ayudarlo a calcular la cantidad adecuada para usted. Una vez que sepa la cantidad de carbohidratos que puede consumir, podr calcular los carbohidratos de los alimentos que desea comer. Los siguientes alimentos incluyen carbohidratos:  Granos, como panes y cereales.  Frijoles secos y productos con soja.  Vegetales almidonados, como papas, guisantes y maz.  Nils Pyle y jugos de frutas.  Leche y Dentist.  Dulces y bocadillos, como pastel, galletas, caramelos, papas fritas de bolsa, refrescos y bebidas frutales con azcar. RECUENTO DE CARBOHIDRATOS [employer] de calcular los carbohidratos de los alimentos. Puede usar cualquiera de [address] o [place] combinacin de [place]. Leer la etiqueta de informacin nutricional de los alimentos envasados La informacin nutricional es una etiqueta incluida en casi todas las bebidas y los alimentos envasados de los [place]. Indica el tamao de la porcin de ese alimento o bebida e informacin sobre los nutrientes de cada porcin, incluso los gramos (g) de carbohidratos por porcin.  Decida la  cantidad de porciones que comer o tomar de este alimento o bebida. Multiplique la cantidad de porciones por el nmero de gramos de carbohidratos indicados en la etiqueta para esa porcin. El total ser la cantidad de carbohidratos que consumir al comer ese alimento o tomar esa bebida. Conocer las porciones estndar de los alimentos Cuando coma alimentos no envasados o que no incluyan la informacin nutricional en la etiqueta, deber medir las porciones para poder calcular la cantidad de carbohidratos. Una porcin de la mayora de los alimentos ricos en carbohidratos contiene alrededor de 15g de carbohidratos. La siguiente [employer] Aguirre tamaos de porcin de los alimentos ricos en carbohidratos que contienen alrededor de 15g de carbohidratos por porcin:   1rebanada de pan (1oz) o 1tortilla de seis pulgadas.  panecillo de hamburguesa o bollito tipo ingls.  4a 6galletas.   de taza de cereal sin azcar y seco.   taza de cereal caliente.   de taza de arroz o pastas.  taza de pur de papas o de una papa grande al horno.  1taza de frutas frescas o una fruta pequea.  taza de frutas o jugo de frutas enlatados o congelados.  1 taza [employer].   de taza de yogur descremado sin ningn  agregado o de yogur endulzado con edulcorante artificial.  taza de vegetales almidonados, como guisantes, maz o papas, o de frijoles secos cocidos. Decida la cantidad de porciones Advertising copywriter. Multiplique la cantidad de porciones por 15 (los gramos de carbohidratos en esa porcin). Por ejemplo, si come 2tazas de fresas, habr comido 2porciones y 30g de carbohidratos (2porciones x 15g = 30g). Para las comidas como sopas y guisos, en las que se mezcla ms de un alimento, deber [employer] carbohidratos de cada alimento incluido. EJEMPLO DE RECUENTO DE CARBOHIDRATOS Ejemplo de cena  3 onzas de pechugas de pollo.   de taza de arroz integral.   taza de maz.  1 taza de  [place].  1 taza de fresas con crema batida sin azcar. Clculo de carbohidratos [place] 1: Identifique los alimentos que contienen carbohidratos:   Arroz.  Maz.  Leche.  Jinny Sanders. [place] 2: Calcule el nmero de porciones que consumir de cada uno:   2 porciones de Surveyor, minerals.  1 porcin de maz.  1 porcin de leche.  1 porcin de fresas. [place] 3: Multiplique cada una de esas porciones por 15g:   2 porciones de arroz x 15 g = 30 g.  1 porcin de maz x 15 g = 15 g.  1 porcin de leche x 15 g = 15 g.  1 porcin de fresas x 15 g = 15 g. [place] 4: Sume todas las cantidades para Artist total de gramos de carbohidratos consumidos: 30 g + 15 g + 15 g + 15 g = 75 g.   Esta informacin no tiene Theme park Aguirre el consejo del mdico. Asegrese de hacerle al mdico cualquier pregunta que tenga.   Document Released: 11/28/2011 Document Revised: 09/26/2014 Elsevier Interactive Patient Education 2016 Alexandra Aguirre. Diabetes mellitus tipo2, adultos (Type 2 Diabetes Mellitus, Adult) La diabetes mellitus tipo2, generalmente denominada diabetes tipo2, es una Aguirre prolongada (crnica). En la diabetes tipo2, el pncreas no produce suficiente insulina (una hormona), las clulas son menos sensibles a la insulina que se produce (resistencia a la insulina), o ambos. Normalmente, la [employer] azcares de los alimentos a las clulas de los tejidos. Las clulas de los tejidos Cendant Aguirre azcares para Psychiatrist. La falta de insulina o la falta de una respuesta normal a la insulina hace que el exceso de azcar se acumule en la sangre en lugar de Customer service Aguirre en las clulas de los tejidos. Como resultado, se producen niveles altos de Banker (hiperglucemia). El [address] de los niveles altos de azcar (glucosa) puede causar muchas complicaciones.  La diabetes tipo2 antes tambin se denominaba diabetes del [place], pero puede ocurrir a Actuary.  FACTORES DE RIESGO  Una  persona tiene mayor predisposicin a desarrollar diabetes tipo 2 si alguien en su familia tiene la Aguirre y tambin tiene uno o ms de los siguientes factores de riesgo principales:  Aumento de [place], sobrepeso u obesidad.  Estilo de vida sedentario.  Una historia de consumo constante de alimentos de altas caloras. Mantener un peso saludable y realizar actividad fsica regular puede reducir la probabilidad de desarrollar diabetes tipo 2. SNTOMAS  Una persona con diabetes tipo 2 no presenta sntomas en un principio. Los sntomas de la diabetes tipo 2 aparecen lentamente. Los sntomas son:  Aumento de la sed (polidipsia).  Aumento de la miccin (poliuria).  Orina con ms frecuencia durante la noche (nocturia).  Cambios repentinos en el peso o sin motivo aparente.  Infecciones frecuentes y recurrentes.  Cansancio (fatiga).  Debilidad.  Cambios en la visin, como visin borrosa.  Olor a Water quality Alexandra Aguirre.  Dolor abdominal.  Nuseas o vmitos.  Cortes o moretones que tardan en sanarse.  Hormigueo o adormecimiento de las manos y los pies.  Una herida abierta en la piel (lcera). DIAGNSTICO Con frecuencia la diabetes tipo 2 no se diagnostica hasta que se presentan las complicaciones de la diabetes. La diabetes tipo 2 se diagnostica cuando los sntomas o las complicaciones se presentan y cuando aumentan los niveles de glucosa en la [place]. El nivel de glucosa en la sangre puede controlarse en uno o ms de los siguientes anlisis de sangre:  Medicin de glucosa en la sangre en [place]. No se le permitir comer durante al menos 8 horas antes de que se tome [place] de [place].  Pruebas al azar de glucosa en la sangre. El nivel de glucosa en la sangre se controla en cualquier momento del da sin importar el momento en que haya comido.  Prueba de A1c (hemoglobina glucosilada) Una prueba de A1c proporciona informacin sobre el control de la glucosa en la sangre durante los  ltimos 3 meses.  Prueba de tolerancia a la glucosa oral (PTGO). La glucosa en la sangre se mide despus de no haber comido (ayunas) durante dos horas y despus de beber una bebida que contenga glucosa. TRATAMIENTO   Usted puede necesitar administrarse insulina o medicamentos para la diabetes todos los das para [address] Aguirre niveles de glucosa en la sangre en el rango deseado.  Si [place] insulina, tal vez necesite ajustar la dosis segn los carbohidratos que haya consumido en cada comida o colacin.  [place] Aguirre del tratamiento, se recomienda hacer cambios en el estilo de vida. Estos pueden incluir lo siguiente:  Customer service Aguirre de alimentacin personalizado elaborado por un nutricionista.  Hacer ejercicio fsico a diario. El mdico establecer los objetivos personalizados del tratamiento para usted en funcin de su edad, los medicamentos que toma, el tiempo que hace que tiene diabetes y cualquier otra Aguirre que padezca. Generalmente, el objetivo del tratamiento es [address] Aguirre siguientes niveles sanguneos de glucosa:  Antes de las comidas (preprandial): de 80 a 130 mg/dl.  Antes de las comidas (preprandial): de 80 a 130 mg/dl.  A1c: menos del 6,5 % al 7 %. INSTRUCCIONES PARA EL CUIDADO EN EL HOGAR   Controle su nivel de hemoglobina A1c dos veces al ao.  Contrlese a diario Air cabin crew de glucosa en la sangre segn las indicaciones de su mdico.  Supervise las cetonas en la orina cuando est enferma y segn las indicaciones de su mdico.  Tome el medicamento para la diabetes o adminstrese insulina segn las indicaciones de su mdico para Radio producer nivel de glucosa en la sangre en el rango deseado.  Nunca se quede sin medicamento para la diabetes o sin insulina. Es necesario que la reciba [employer].  Si [place] insulina, tal vez deba ajustar la cantidad de insulina administrada segn los carbohidratos consumidos. Los hidratos de carbono pueden aumentar los niveles de glucosa en la  sangre, pero deben incluirse en su dieta. Los hidratos de carbono aportan vitaminas, minerales y [place] que son Alexandra Aguirre parte esencial de una dieta saludable. Los hidratos de carbono se encuentran en frutas, verduras, cereales integrales, productos lcteos, legumbres y alimentos que contienen azcares aadidos.  Consuma alimentos saludables. Programe una cita con un nutricionista certificado para que lo ayude a Chief Executive Officer de alimentacin adecuado para usted.  Baje de  peso si es necesario.  Lleve una tarjeta de alerta mdica o use una pulsera o medalla de alerta mdica.  Lleve con usted una colacin de 15gramos de hidratos de carbono en todo momento para controlar los niveles bajos de glucosa en la sangre (hipoglucemia). Algunos ejemplos de colaciones de 15gramos de hidratos de carbono son los siguientes:  Tabletas de glucosa, 3 o 4.  Gel de glucosa, tubo de 15 gramos.  Pasas de uva, 2 cucharadas (24 gramos).  Caramelos de goma, 6.  Galletas de Schneideranimales, 8.  Gaseosa comn, 4onzas (120mililitros).  Pastillas de goma, 9.  Reconocer la hipoglucemia. La hipoglucemia se produce cuando los niveles de glucosa en la sangre son de 70 mg/dl o menos. El riesgo de hipoglucemia aumenta durante el ayuno o cuando se saltea las comidas, durante o despus de Education officer, environmentalrealizar ejercicio intenso y Independencemientras duerme. Los sntomas de hipoglucemia son:  Temblores o sacudidas.  Disminucin de la capacidad de concentracin.  Sudoracin.  Aumento de la frecuencia cardaca.  Dolor de Turkmenistancabeza.  Sequedad en la boca.  Hambre.  Irritabilidad.  Ansiedad.  Sueo agitado.  Alteracin del habla o de la coordinacin.  Confusin.  Tratar la hipoglucemia rpidamente. Si usted est alerta y puede tragar con seguridad, siga la regla de 15/15 que consiste en:  [place] Southernome entre 15 y 20gramos de glucosa de accin rpida o carbohidratos. Las opciones de accin rpida son un gel de glucosa, tabletas de glucosa, o 4  onzas (120 ml) de jugo de frutas, gaseosa comn, o leche baja en grasa.  Compruebe su nivel de glucosa en la sangre 15 minutos despus de tomar la glucosa.  Tome entre 15 y 20 gramos ms de glucosa si el nivel de glucosa en la sangre todava es de 70mg /dl o inferior.  Ingiera una comida o una colacin en el lapso de 1 hora una vez que los niveles de glucosa en la sangre vuelven a la normalidad.  Est atento a si siente mucha sed u orina con mayor frecuencia, porque son signos tempranos de hiperglucemia. El reconocimiento temprano de la hiperglucemia permite un tratamiento oportuno. Trate la hiperglucemia segn le indic su mdico.  Haga, al menos, 150minutos de actividad fsica moderada a la semana, distribuidos en, por lo menos, 3das a la semana o como lo indique su mdico. Adems, debe realizar ejercicios de resistencia por lo menos 2veces a la semana o como lo indique su mdico. Trate de no permanecer inactivo durante ms de 90minutos seguidos.  Ajuste su medicamento y la ingesta de alimentos, segn sea necesario, si inicia un nuevo ejercicio o deporte.  Siga su plan para los [address]  [address] de Aguirre cuando no puede comer o beber como de Chickamaugacostumbre.  No consuma ningn producto que contenga tabaco, como cigarrillos, tabaco de Alexandra managermascar o Administrator, Civil Servicecigarrillos electrnicos. Si necesita ayuda para dejar de fumar, hable con el mdico.  Limite el consumo de alcohol a no ms de 1 medida por da en las mujeres no embarazadas y 2 medidas en los hombres. Debe beber alcohol solo mientras come. Hable con su mdico acerca de si el alcohol es seguro para usted. Informe a su mdico si bebe alcohol varias veces a la semana.  Concurra a todas las visitas de control como se lo haya indicado el mdico. Esto es importante.  Programe un examen de la vista poco despus del diagnstico de diabetes tipo 2 y luego anualmente.  Realice diariamente el cuidado de la piel y de los pies. Examine su piel y los pies diariamente  para  ver si tiene cortes, moretones, enrojecimiento, problemas en las uas, sangrado, ampollas o llagas. Su mdico debe hacerle un examen de los pies una vez por ao.  Cepllese los dientes y encas por lo menos dos veces al da y use hilo dental al menos una vez por da. Concurra regularmente a las visitas de control con el dentista.  Comparta su plan de control de diabetes en el trabajo o en la escuela.  Mantngase al da con las vacunas. Se recomienda que se vacune contra la gripe todos los [place]. Adems, que se vacune contra la neumona (vacuna antineumoccica). Si es mayor de 86 aos y nunca se Animator la neumona, esta vacuna puede administrarse como una serie de dos vacunas por separado. Pregntele al mdico qu otras vacunas se pueden recomendar.  Aprenda a Dealer.  Obtenga la mayor cantidad posible de informacin sobre la diabetes y solicite ayuda siempre que sea necesario.  Busque programas de rehabilitacin y participe en ellos para mantener o mejorar su independencia y su calidad de vida. Solicite la derivacin a fisioterapia o terapia ocupacional si se le [facility] o la mano, o tiene problemas para asearse, vestirse, comer, o durante la Alexandra Aguirre fsica. SOLICITE ATENCIN MDICA SI:   No puede comer alimentos o beber por ms de 6 horas.  Tuvo nuseas o ha vomitado durante ms de 6 horas.  Su nivel de glucosa en la sangre es mayor de 240 mg/dl.  Presenta algn cambio en el estado mental.  Desarrolla una Aguirre grave adicional.  Tuvo diarrea durante ms de 6 horas.  Ha estado enfermo o ha tenido fiebre durante un par [address] y no mejora.  Siente dolor al practicar cualquier actividad fsica. SOLICITE ATENCIN MDICA DE INMEDIATO SI:  Tiene dificultad para respirar.  Tiene niveles de cetonas moderados a altos.   Esta informacin no tiene Theme park Aguirre el consejo del mdico. Asegrese de hacerle al mdico cualquier pregunta que tenga.     Document Released: 09/05/2005 Document Revised: 05/27/2015 Elsevier Interactive Patient Education [employer]

## 2016-03-12 NOTE — ED Notes (Signed)
C/o headache in the back of head. No c/o of cold or sickness.

## 2016-03-12 NOTE — ED Notes (Signed)
CBG [id number]  

## 2016-06-16 ENCOUNTER — Encounter (HOSPITAL_COMMUNITY): Payer: Self-pay | Admitting: Physical Medicine and Rehabilitation

## 2016-06-16 ENCOUNTER — Emergency Department (HOSPITAL_COMMUNITY)
Admission: EM | Admit: 2016-06-16 | Discharge: 2016-06-17 | Disposition: A | Discharge status: Home / Self Care | Payer: Self-pay | Attending: Emergency Medicine | Admitting: Emergency Medicine

## 2016-06-16 ENCOUNTER — Emergency Department (HOSPITAL_COMMUNITY): Payer: Self-pay

## 2016-06-16 DIAGNOSIS — Z7984 Long term (current) use of oral hypoglycemic drugs: Secondary | ICD-10-CM | POA: Insufficient documentation

## 2016-06-16 DIAGNOSIS — R0602 Shortness of breath: Secondary | ICD-10-CM | POA: Insufficient documentation

## 2016-06-16 DIAGNOSIS — F439 Reaction to severe stress, unspecified: Secondary | ICD-10-CM | POA: Insufficient documentation

## 2016-06-16 DIAGNOSIS — E119 Type 2 diabetes mellitus without complications: Secondary | ICD-10-CM | POA: Insufficient documentation

## 2016-06-16 DIAGNOSIS — F419 Anxiety disorder, unspecified: Secondary | ICD-10-CM | POA: Insufficient documentation

## 2016-06-16 DIAGNOSIS — Z7982 Long term (current) use of aspirin: Secondary | ICD-10-CM | POA: Insufficient documentation

## 2016-06-16 DIAGNOSIS — J45909 Unspecified asthma, uncomplicated: Secondary | ICD-10-CM | POA: Insufficient documentation

## 2016-06-16 HISTORY — DX: Type 2 diabetes mellitus without complications: E11.9

## 2016-06-16 LAB — COMPREHENSIVE METABOLIC PANEL
ALK PHOS: 129 U/L — AB (ref 38–126)
ALT: 38 U/L (ref 14–54)
ANION GAP: 12 (ref 5–15)
AST: 33 U/L (ref 15–41)
Albumin: 3.7 g/dL (ref 3.5–5.0)
BILIRUBIN TOTAL: 0.5 mg/dL (ref 0.3–1.2)
BUN: 10 mg/dL (ref 6–20)
CALCIUM: 9.3 mg/dL (ref 8.9–10.3)
CO2: 24 mmol/L (ref 22–32)
Chloride: 99 mmol/L — ABNORMAL LOW (ref 101–111)
Creatinine, Ser: 0.6 mg/dL (ref 0.44–1.00)
GLUCOSE: 139 mg/dL — AB (ref 65–99)
Potassium: 3.7 mmol/L (ref 3.5–5.1)
SODIUM: 135 mmol/L (ref 135–145)
TOTAL PROTEIN: 7.8 g/dL (ref 6.5–8.1)

## 2016-06-16 LAB — CBC WITH DIFFERENTIAL/PLATELET
Basophils Absolute: 0 10*3/uL (ref 0.0–0.1)
Basophils Relative: 0 %
EOS ABS: 0.2 10*3/uL (ref 0.0–0.7)
Eosinophils Relative: 2 %
HEMATOCRIT: 39.3 % (ref 36.0–46.0)
HEMOGLOBIN: 12.9 g/dL (ref 12.0–15.0)
LYMPHS ABS: 3.5 10*3/uL (ref 0.7–4.0)
LYMPHS PCT: 32 %
MCH: 29.1 pg (ref 26.0–34.0)
MCHC: 32.8 g/dL (ref 30.0–36.0)
MCV: 88.7 fL (ref 78.0–100.0)
MONOS PCT: 6 %
Monocytes Absolute: 0.6 10*3/uL (ref 0.1–1.0)
NEUTROS PCT: 60 %
Neutro Abs: 6.6 10*3/uL (ref 1.7–7.7)
Platelets: 416 10*3/uL — ABNORMAL HIGH (ref 150–400)
RBC: 4.43 MIL/uL (ref 3.87–5.11)
RDW: 12.9 % (ref 11.5–15.5)
WBC: 11 10*3/uL — AB (ref 4.0–10.5)

## 2016-06-16 LAB — I-STAT TROPONIN, ED: Troponin i, poc: 0.01 ng/mL (ref 0.00–0.08)

## 2016-06-16 MED ORDER — GI COCKTAIL ~~LOC~~
30.0000 mL | Freq: Once | ORAL | Status: AC
  Administered 2016-06-16: 30 mL via ORAL
  Filled 2016-06-16: qty 30

## 2016-06-16 MED ORDER — SODIUM CHLORIDE 0.9 % IV BOLUS (SEPSIS)
1000.0000 mL | Freq: Once | INTRAVENOUS | Status: AC
  Administered 2016-06-16: 1000 mL via INTRAVENOUS

## 2016-06-16 NOTE — ED Triage Notes (Signed)
Pt reports midsternal chest pain radiating to neck since Wednesday afternoon. Describes as pressure sensation. Also states SOB.

## 2016-06-16 NOTE — ED Provider Notes (Signed)
[facility] DEPT Provider Note   CSN: [id number] Arrival date & time: 06/16/16  1634  History   Chief Complaint Chief Complaint  Patient presents with  . Chest Pain    HPI Alexandra Aguirre is a 40 y.o. female.  HPI   Patient has a PMH of abnormal pap smear, asthma, diabetes and GERD. She reports having midsternal CP that radiated into her back and both arms since Wednesday. She describes the sensation as pressure and it is associated with some shortness of breath. It started yesterday afternoon and has been worsening, it is different than her typical GERD symptoms.  Nothing makes it better or worse, no relief with Ibuprofen. Lasts a few seconds when she has an exacerbation, pain waxes and wanes and relieves typically for a few minutes.   +chills and poor appetite, + loss of voice, cough, bilateral flank pain - nasal congestion, palpitations, abdominal pain, change in bowel habits, no vomiting. Dysuria/hematuria  Past Medical History:  Diagnosis Date  . Abnormal Pap smear of cervix 09/24/2013   AGUS  . Asthma   . Diabetes mellitus without complication (HCC)   . GERD (gastroesophageal reflux disease)     Patient Active Problem List   Diagnosis Date Noted  . IFG (impaired fasting glucose) 02/18/2014  . UTI (urinary tract infection) 01/30/2014  . Vaginal bleeding between periods 01/30/2014  . Dysmenorrhea 01/30/2014  . Menorrhagia 01/30/2014  . Asthma with acute exacerbation 01/15/2014  . Vertigo 11/25/2013  . History of cervical dysplasia 11/01/2013  . Atypical glandular cells of undetermined significance (AGUS) on cervical Pap smear 10/08/2013  . Breast pain, right 10/08/2013  . Pedal edema 09/30/2013  . Migraine headache 09/30/2013  . Unspecified gastritis and gastroduodenitis without mention of hemorrhage 09/30/2013  . Back pain 09/30/2013    Past Surgical History:  Procedure Laterality Date  . CERVICAL BIOPSY  W/ LOOP ELECTRODE EXCISION    . removal of cyst Right  2011    OB History    Gravida Para Term Preterm AB Living   5 5 5     5    SAB TAB Ectopic Multiple Live Births                   Home Medications    Prior to Admission medications   Medication Sig Start Date End Date Taking? Authorizing Provider  albuterol (PROVENTIL HFA;VENTOLIN HFA) 108 (90 BASE) MCG/ACT inhaler Inhale 2 puffs into the lungs every 6 (six) hours as needed for wheezing or shortness of breath. 01/14/14   Carney Living, MD  aspirin-acetaminophen-caffeine (EXCEDRIN MIGRAINE) [phone] MG per tablet Take 1 tablet by mouth as needed for headache or migraine.    Historical Provider, MD  bismuth subsalicylate (PEPTO BISMOL) 262 MG chewable tablet Chew 524 mg by mouth as needed for indigestion or diarrhea or loose stools.    Historical Provider, MD  bismuth subsalicylate (PEPTO BISMOL) 262 MG/15ML suspension Take 30 mLs by mouth 2 (two) times daily as needed for indigestion or diarrhea or loose stools.    Historical Provider, MD  cetirizine (ZYRTEC) 10 MG tablet Take 1 tablet (10 mg total) by mouth daily. 02/13/15   Doris Cheadle, MD  LORazepam (ATIVAN) 1 MG tablet Take 1 tablet (1 mg total) by mouth 3 (three) times daily as needed for anxiety. 06/17/16   Zavien Clubb Neva Seat, PA-C  metFORMIN (GLUCOPHAGE) 500 MG tablet Take one tablet in the morning x 5 days Then take one in the morning and one in the evening after  that 03/12/16   Elpidio Anis, PA-C  omeprazole (PRILOSEC) 20 MG capsule Take one twice daily for the next 7 days then once daily after that 03/12/16   Elpidio Anis, PA-C  ranitidine (ZANTAC) 150 MG capsule Take 1 capsule (150 mg total) by mouth daily. 12/17/14   Doris Cheadle, MD    Family History Family History  Problem Relation Age of Onset  . Cancer Mother     cervical/ovarian  . Diabetes Maternal Uncle   . Diabetes Brother   . Diabetes Maternal Uncle   . Diabetes Maternal Uncle     Social History Social History  Substance Use Topics  . Smoking status:  Never Smoker  . Smokeless tobacco: Never Used  . Alcohol use No     Allergies   Fruit & vegetable daily [nutritional supplements]   Review of Systems Review of Systems Review of Systems All other systems negative except as documented in the HPI. All pertinent positives and negatives as reviewed in the HPI.   Physical Exam Updated Vital Signs BP 99/69   Pulse 71   Temp 98.3 F (36.8 C) (Oral)   Resp 23   SpO2 97%   Physical Exam  Constitutional: She appears well-developed and well-nourished. No distress.  HENT:  Head: Normocephalic and atraumatic.  Eyes: Pupils are equal, round, and reactive to light.  Neck: Normal range of motion. Neck supple.  Cardiovascular: Normal rate and regular rhythm.   Pulmonary/Chest: Effort normal.  Tenderness to palpation of chest wall and bilateral flank.  Abdominal: Soft.  Neurological: She is alert.  Skin: Skin is warm and dry.  Psychiatric: Her mood appears anxious. She exhibits a depressed mood.  + tearful  Nursing note and vitals reviewed.    ED Treatments / Results  Labs (all labs ordered are listed, but only abnormal results are displayed) Labs Reviewed  CBC WITH DIFFERENTIAL/PLATELET - Abnormal; Notable for the following:       Result Value   WBC 11.0 (*)    Platelets 416 (*)    All other components within normal limits  COMPREHENSIVE METABOLIC PANEL - Abnormal; Notable for the following:    Chloride 99 (*)    Glucose, Bld 139 (*)    Alkaline Phosphatase 129 (*)    All other components within normal limits  URINALYSIS, ROUTINE W REFLEX MICROSCOPIC (NOT AT [facility]) - Abnormal; Notable for the following:    APPearance CLOUDY (*)    Leukocytes, UA SMALL (*)    All other components within normal limits  URINE MICROSCOPIC-ADD ON - Abnormal; Notable for the following:    Squamous Epithelial / LPF 0-5 (*)    Bacteria, UA RARE (*)    All other components within normal limits  BRAIN NATRIURETIC PEPTIDE  D-DIMER, QUANTITATIVE  (NOT AT [facility])  I-STAT TROPOININ, ED  POC URINE PREG, ED    EKG  EKG Interpretation  Date/Time:  Friday June 17 2016 00:11:13 EDT Ventricular Rate:  68 PR Interval:  148 QRS Duration: 99 QT Interval:  430 QTC Calculation: 458 R Axis:   26 Text Interpretation:  Sinus rhythm Consider RVH or posterior infarct no acute changes  Confirmed by LIU MD, DANA [phone]) on 06/17/2016 12:42:40 AM       Radiology Dg Chest 2 View  Result Date: 06/16/2016 CLINICAL DATA:  40 year old acute onset of chest pain and pressure radiating into the neck which began yesterday, associated with shortness of breath. Current history of asthma and diabetes. EXAM: CHEST  2 VIEW COMPARISON:  03/11/2016, 10/17/2014 and earlier. FINDINGS: Cardiomediastinal silhouette unremarkable, unchanged. Stable mild chronic elevation of the left hemidiaphragm. Stable mild central peribronchial thickening. Lungs otherwise clear. No localized airspace consolidation. No pleural effusions. No pneumothorax. Normal pulmonary vascularity. Mild degenerative changes involving the lower thoracic spine and at the thoracolumbar junction. IMPRESSION: Stable mild changes of chronic bronchitis and/or asthma. No acute cardiopulmonary disease. Electronically Signed   By: Hulan Saashomas  Lawrence M.D.   On: 06/16/2016 17:42    Procedures Procedures (including critical care time)  Medications Ordered in ED Medications  LORazepam (ATIVAN) tablet 1 mg (not administered)  sodium chloride 0.9 % bolus 1,000 mL (0 mLs Intravenous Stopped 06/17/16 0027)  gi cocktail (Maalox,Lidocaine,Donnatal) (30 mLs Oral Given 06/16/16 2324)  morphine 4 MG/ML injection 4 mg (4 mg Intravenous Given 06/17/16 0031)  ondansetron (ZOFRAN) injection 4 mg (4 mg Intravenous Given 06/17/16 0029)     Initial Impression / Assessment and Plan / ED Course  I have reviewed the triage vital signs and the nursing notes.  Pertinent labs & imaging results that were available during my care  of the patient were reviewed by me and considered in my medical decision making (see chart for details).  Clinical Course    HEART SCORE OF 1- diabetic  Final Clinical Impressions(s) / ED Diagnoses   Final diagnoses:  Anxiety  Stress   Just prior to discharge the patient starts crying really hard and tells us that her son went to two classes yesterday and since then has not been back to school, he ran away and no one knows where he is. She does not know if he is okay. She says that everything she thinks about it and him that is when she gets the chest pains. PA Student stayed in room to talk with patient using interpretor video chat for an extended period of time to offer support. She denies SI/Hi.  [facility] Prescriptions New Prescriptions   LORAZEPAM (ATIVAN) 1 MG TABLET    Take 1 tablet (1 mg total) by mouth 3 (three) times daily as needed for anxiety.     Marlon Peliffany Mattison Stuckey, PA-C 06/17/16 [id number]    Lavera Guiseana Duo Liu, MD 06/17/16 [phone]

## 2016-06-17 ENCOUNTER — Ambulatory Visit: Payer: Self-pay

## 2016-06-17 LAB — URINE MICROSCOPIC-ADD ON: RBC / HPF: NONE SEEN RBC/hpf (ref 0–5)

## 2016-06-17 LAB — URINALYSIS, ROUTINE W REFLEX MICROSCOPIC
BILIRUBIN URINE: NEGATIVE
Glucose, UA: NEGATIVE mg/dL
Hgb urine dipstick: NEGATIVE
Ketones, ur: NEGATIVE mg/dL
NITRITE: NEGATIVE
Protein, ur: NEGATIVE mg/dL
SPECIFIC GRAVITY, URINE: 1.029 (ref 1.005–1.030)
pH: 6 (ref 5.0–8.0)

## 2016-06-17 LAB — D-DIMER, QUANTITATIVE: D-Dimer, Quant: 0.45 ug/mL-FEU (ref 0.00–0.50)

## 2016-06-17 LAB — BRAIN NATRIURETIC PEPTIDE: B Natriuretic Peptide: 7 pg/mL (ref 0.0–100.0)

## 2016-06-17 LAB — POC URINE PREG, ED: Preg Test, Ur: NEGATIVE

## 2016-06-17 MED ORDER — LORAZEPAM 1 MG PO TABS: 1.0000 mg | ORAL_TABLET | Freq: Three times a day (TID) | ORAL | 0 refills | Status: DC | PRN

## 2016-06-17 MED ORDER — HYDROCODONE-ACETAMINOPHEN 5-325 MG PO TABS: 1.0000 | ORAL_TABLET | ORAL | 0 refills | Status: DC | PRN

## 2016-06-17 MED ORDER — ONDANSETRON HCL 4 MG/2ML IJ SOLN
4.0000 mg | Freq: Once | INTRAMUSCULAR | Status: AC
  Administered 2016-06-17: 4 mg via INTRAVENOUS
  Filled 2016-06-17: qty 2

## 2016-06-17 MED ORDER — MORPHINE SULFATE (PF) 4 MG/ML IV SOLN
4.0000 mg | Freq: Once | INTRAVENOUS | Status: AC
  Administered 2016-06-17: 4 mg via INTRAVENOUS
  Filled 2016-06-17: qty 1

## 2016-06-17 MED ORDER — LORAZEPAM 1 MG PO TABS
1.0000 mg | ORAL_TABLET | Freq: Once | ORAL | Status: AC
  Administered 2016-06-17: 1 mg via ORAL
  Filled 2016-06-17: qty 1

## 2016-06-17 MED ORDER — CYCLOBENZAPRINE HCL 10 MG PO TABS: 5.0000 mg | ORAL_TABLET | Freq: Two times a day (BID) | ORAL | 0 refills | Status: DC | PRN

## 2016-06-17 NOTE — ED Notes (Signed)
Interpreter service at bedside

## 2016-06-17 NOTE — ED Notes (Signed)
EDP at bedside  

## 2016-08-09 ENCOUNTER — Ambulatory Visit: Payer: Self-pay | Attending: Internal Medicine

## 2016-09-22 ENCOUNTER — Encounter (HOSPITAL_COMMUNITY): Payer: Self-pay | Admitting: Emergency Medicine

## 2016-09-22 ENCOUNTER — Emergency Department (HOSPITAL_COMMUNITY)
Admission: EM | Admit: 2016-09-22 | Discharge: 2016-09-22 | Disposition: A | Discharge status: Home / Self Care | Payer: Self-pay | Attending: Emergency Medicine | Admitting: Emergency Medicine

## 2016-09-22 DIAGNOSIS — T6591XA Toxic effect of unspecified substance, accidental (unintentional), initial encounter: Secondary | ICD-10-CM

## 2016-09-22 DIAGNOSIS — E119 Type 2 diabetes mellitus without complications: Secondary | ICD-10-CM | POA: Insufficient documentation

## 2016-09-22 DIAGNOSIS — T5491XA Toxic effect of unspecified corrosive substance, accidental (unintentional), initial encounter: Secondary | ICD-10-CM | POA: Insufficient documentation

## 2016-09-22 DIAGNOSIS — J45909 Unspecified asthma, uncomplicated: Secondary | ICD-10-CM | POA: Insufficient documentation

## 2016-09-22 DIAGNOSIS — Z7982 Long term (current) use of aspirin: Secondary | ICD-10-CM | POA: Insufficient documentation

## 2016-09-22 DIAGNOSIS — Z7984 Long term (current) use of oral hypoglycemic drugs: Secondary | ICD-10-CM | POA: Insufficient documentation

## 2016-09-22 NOTE — ED Notes (Signed)
Family at bedside. 

## 2016-09-22 NOTE — ED Notes (Signed)
Patient ambulated to the room independently.  No acute distress noted

## 2016-09-22 NOTE — ED Triage Notes (Signed)
Pt presents to ED for assessment after swallowing water mixed BORAX.  Child does not know how much he put in, but he was planning on making puddy.  Pt c/o throat tightening and multiple episodes of vomiting since the consumption.  Pt c/o some blood noted in emesis.  Pt coughing at triage and c/o of nausea.  Spoke to poison control who states to monitor the patient and verify she can tolerate PO before d/c.

## 2016-09-22 NOTE — ED Provider Notes (Signed)
[facility] Provider Note   CSN: [id number] Arrival date & time: 09/22/16  0040   By signing my name below, I, Alexandra SalisburyJoshua Aguirre, attest that this documentation has been prepared under the direction and in the presence of Alexandra Aguirre Cartaya, MD . Electronically Signed: Nelwyn SalisburyJoshua Aguirre, Scribe. 09/22/2016. 2:37 AM.  History   Chief Complaint Chief Complaint  Patient presents with  . Ingestion   The history is provided by the patient. A language interpreter was used.  Ingestion  This is a new problem. The current episode started 3 to 5 hours ago. The problem occurs constantly. The problem has not changed since onset.Associated symptoms include abdominal pain. Pertinent negatives include no chest pain. Nothing aggravates the symptoms. Nothing relieves the symptoms. She has tried nothing for the symptoms. The treatment provided no relief.    HPI Comments:  Alexandra Aguirre is a 41 y.o. female with pmhx of DM who presents to the Emergency Department complaining of sudden-onset frequent vomiting following ingestion of Borax about 3 hours ago. Pt states that she drank from a cup that she thought had water, but instead had a mixture of borax and water that her son concocted to make silly putty. The pt describes a sensation of her throat tightening instantly after ingestion followed by sudden vomiting.  The Pt's son states he did not put a large amount of borax into the cup. She reports associated LUQ abdominal pain. The pt tried drinking some milk for her symptoms, but was not able to hold it down. She denies any chest pain or back pain.    Translator identification number: [id number]   Past Medical History:  Diagnosis Date  . Abnormal Pap smear of cervix 09/24/2013   AGUS  . Asthma   . Diabetes mellitus without complication (HCC)   . GERD (gastroesophageal reflux disease)     Patient Active Problem List   Diagnosis Date Noted  . IFG (impaired fasting glucose) 02/18/2014  . UTI (urinary tract  infection) 01/30/2014  . Vaginal bleeding between periods 01/30/2014  . Dysmenorrhea 01/30/2014  . Menorrhagia 01/30/2014  . Asthma with acute exacerbation 01/15/2014  . Vertigo 11/25/2013  . History of cervical dysplasia 11/01/2013  . Atypical glandular cells of undetermined significance (AGUS) on cervical Pap smear 10/08/2013  . Breast pain, right 10/08/2013  . Pedal edema 09/30/2013  . Migraine headache 09/30/2013  . Unspecified gastritis and gastroduodenitis without mention of hemorrhage 09/30/2013  . Back pain 09/30/2013    Past Surgical History:  Procedure Laterality Date  . CERVICAL BIOPSY  W/ LOOP ELECTRODE EXCISION    . removal of cyst Right 2011    OB History    Gravida Para Term Preterm AB Living   5 5 5     5    SAB TAB Ectopic Multiple Live Births                   Home Medications    Prior to Admission medications   Medication Sig Start Date End Date Taking? Authorizing Provider  albuterol (PROVENTIL HFA;VENTOLIN HFA) 108 (90 BASE) MCG/ACT inhaler Inhale 2 puffs into the lungs every 6 (six) hours as needed for wheezing or shortness of breath. 01/14/14   Carney LivingMarshall L Chambliss, MD  aspirin-acetaminophen-caffeine (EXCEDRIN MIGRAINE) [phone] MG per tablet Take 1 tablet by mouth as needed for headache or migraine.    Historical Provider, MD  bismuth subsalicylate (PEPTO BISMOL) 262 MG chewable tablet Chew 524 mg by mouth as needed for indigestion or diarrhea or loose  stools.    Historical Provider, MD  bismuth subsalicylate (PEPTO BISMOL) 262 MG/15ML suspension Take 30 mLs by mouth 2 (two) times daily as needed for indigestion or diarrhea or loose stools.    Historical Provider, MD  cetirizine (ZYRTEC) 10 MG tablet Take 1 tablet (10 mg total) by mouth daily. 02/13/15   Doris Cheadle, MD  LORazepam (ATIVAN) 1 MG tablet Take 1 tablet (1 mg total) by mouth 3 (three) times daily as needed for anxiety. 06/17/16   Tiffany Neva Seat, PA-C  metFORMIN (GLUCOPHAGE) 500 MG tablet  Take one tablet in the morning x 5 days Then take one in the morning and one in the evening after that 03/12/16   Elpidio Anis, PA-C  omeprazole (PRILOSEC) 20 MG capsule Take one twice daily for the next 7 days then once daily after that 03/12/16   Elpidio Anis, PA-C  ranitidine (ZANTAC) 150 MG capsule Take 1 capsule (150 mg total) by mouth daily. 12/17/14   Doris Cheadle, MD    Family History Family History  Problem Relation Age of Onset  . Cancer Mother     cervical/ovarian  . Diabetes Maternal Uncle   . Diabetes Brother   . Diabetes Maternal Uncle   . Diabetes Maternal Uncle     Social History Social History  Substance Use Topics  . Smoking status: Never Smoker  . Smokeless tobacco: Never Used  . Alcohol use No     Allergies   Fruit & vegetable daily [nutritional supplements]   Review of Systems Review of Systems  Cardiovascular: Negative for chest pain.  Gastrointestinal: Positive for abdominal pain and vomiting.  Musculoskeletal: Negative for back pain.  All other systems reviewed and are negative.    Physical Exam Updated Vital Signs BP 101/68 (BP Location: Right Arm)   Pulse 80   Temp 97.8 F (36.6 C) (Oral)   Resp 18   LMP 09/05/2016   SpO2 99%   Physical Exam CONSTITUTIONAL: Well developed/well nourished. Pt spitting frequently during exam.  HEAD: Normocephalic/atraumatic EYES: EOMI/PERRL ENMT: Mucous membranes moist. Uvula midline, no erythema, no edema, no stridor, normal phonation.  No blood noted in mouth NECK: supple no meningeal signs. No anterior neck edema or crepitus. SPINE/BACK:entire spine nontender CV: S1/S2 noted, no murmurs/rubs/gallops noted LUNGS: Lungs are clear to auscultation bilaterally, no apparent distress ABDOMEN: soft, nontender, no rebound or guarding, bowel sounds noted throughout abdomen GU:no cva tenderness NEURO: Pt is awake/alert/appropriate, moves all extremitiesx4.  No facial droop.   EXTREMITIES: pulses  normal/equal, full ROM SKIN: warm, color normal PSYCH: no abnormalities of mood noted, alert and oriented to situation   ED Treatments / Results  DIAGNOSTIC STUDIES:  Oxygen Saturation is 99% on RA, normal by my interpretation.    COORDINATION OF CARE:  2:41 AM Discussed treatment plan with pt at bedside which includes PO challenge and pt agreed to plan.  Labs (all labs ordered are listed, but only abnormal results are displayed) Labs Reviewed - No data to display  EKG  EKG Interpretation None       Radiology No results found.  Procedures Procedures (including critical care time)  Medications Ordered in ED Medications - No data to display   Initial Impression / Assessment and Plan / ED Course  I have reviewed the triage vital signs and the nursing notes.    Clinical Course     4:15 AM  D/w poison center She has been monitored several hours She is taking PO No distress noted She is now  resting Given that she only had small amt, no further interventions advised D/w patient via Interpreter  [phone] Advised to drink cool liquids and slowly advance diet We discussed strict return precautions BP 104/66   Pulse 82   Temp 97.8 F (36.6 C) (Oral)   Resp 18   LMP 09/05/2016   SpO2 97%    Final Clinical Impressions(s) / ED Diagnoses   Final diagnoses:  Accidental ingestion of substance, initial encounter    New Prescriptions New Prescriptions   No medications on file  I personally performed the services described in this documentation, which was scribed in my presence. The recorded information has been reviewed and is accurate.        Alexandra Rhine, MD 09/22/16 [phone]

## 2016-09-30 ENCOUNTER — Ambulatory Visit: Payer: Medicaid Other | Attending: Internal Medicine | Admitting: Physician Assistant

## 2016-09-30 ENCOUNTER — Encounter: Payer: Self-pay | Admitting: Licensed Clinical Social Worker

## 2016-09-30 VITALS — BP 132/81 | HR 87 | Temp 98.1°F | Resp 20 | Wt 198.0 lb

## 2016-09-30 DIAGNOSIS — Z7984 Long term (current) use of oral hypoglycemic drugs: Secondary | ICD-10-CM | POA: Insufficient documentation

## 2016-09-30 DIAGNOSIS — F411 Generalized anxiety disorder: Secondary | ICD-10-CM | POA: Diagnosis not present

## 2016-09-30 DIAGNOSIS — J45909 Unspecified asthma, uncomplicated: Secondary | ICD-10-CM | POA: Insufficient documentation

## 2016-09-30 DIAGNOSIS — R200 Anesthesia of skin: Secondary | ICD-10-CM | POA: Insufficient documentation

## 2016-09-30 DIAGNOSIS — Z7982 Long term (current) use of aspirin: Secondary | ICD-10-CM | POA: Insufficient documentation

## 2016-09-30 DIAGNOSIS — Z1231 Encounter for screening mammogram for malignant neoplasm of breast: Secondary | ICD-10-CM | POA: Diagnosis not present

## 2016-09-30 DIAGNOSIS — K219 Gastro-esophageal reflux disease without esophagitis: Secondary | ICD-10-CM | POA: Insufficient documentation

## 2016-09-30 DIAGNOSIS — E118 Type 2 diabetes mellitus with unspecified complications: Secondary | ICD-10-CM | POA: Diagnosis not present

## 2016-09-30 DIAGNOSIS — E119 Type 2 diabetes mellitus without complications: Secondary | ICD-10-CM

## 2016-09-30 DIAGNOSIS — Z1239 Encounter for other screening for malignant neoplasm of breast: Secondary | ICD-10-CM

## 2016-09-30 LAB — POCT GLYCOSYLATED HEMOGLOBIN (HGB A1C): Hemoglobin A1C: 8.1

## 2016-09-30 LAB — TSH: TSH: 2.53 m[IU]/L

## 2016-09-30 LAB — POCT URINE PREGNANCY: PREG TEST UR: NEGATIVE

## 2016-09-30 LAB — GLUCOSE, POCT (MANUAL RESULT ENTRY): POC Glucose: 129 mg/dl — AB (ref 70–99)

## 2016-09-30 MED ORDER — TRUE METRIX METER W/DEVICE KIT: PACK | 0 refills | Status: DC

## 2016-09-30 MED ORDER — GLUCOSE BLOOD VI STRP: ORAL_STRIP | 12 refills | Status: DC

## 2016-09-30 MED ORDER — METFORMIN HCL 1000 MG PO TABS: 1000.0000 mg | ORAL_TABLET | Freq: Two times a day (BID) | ORAL | 3 refills | Status: DC

## 2016-09-30 MED ORDER — TRUEPLUS LANCETS 28G MISC
3 refills | Status: DC
Start: 2016-09-30 — End: 2017-08-08

## 2016-09-30 NOTE — Patient Instructions (Signed)
Check blood sugars fasting and at bedtime daily and record and bring to next visit.  Recuento de carbohidratos para la diabetes mellitus en los adultos (Carbohydrate Counting for Diabetes Mellitus, Adult) El recuento de carbohidratos es un mtodo destinado a Midwifellevar un registro de la cantidad de carbohidratos que se ingieren. La ingesta natural de carbohidratos aumenta la cantidad de azcar (glucemia) en la sangre. El recuento de la cantidad de carbohidratos que se ingieren sirve para que el nivel sanguneo de glucosa permanezca dentro de los lmites normales, lo que ayuda a Pharmacologistmantener la diabetes (diabetes mellitus) bajo control. Es importante saber la cantidad de carbohidratos que se pueden ingerir en cada comida sin correr Surveyor, mineralsningn riesgo. Esto es Government social research officerdiferente en cada persona. Un especialista en dietas y alimentacin (nutricionista certificado) puede ayudarlo a crear un plan de alimentacin y a calcular la cantidad de carbohidratos que debe ingerir en cada comida y colacin. Los siguientes alimentos incluyen carbohidratos:  Granos, como los panes y los cereales.  Frijoles secos y productos con soja.  Vegetales almidonados, como papas, guisantes y maz.  Nils PyleFrutas y jugos de frutas.  Leche y Dentistyogur.  Dulces y colaciones, como pasteles, galletas, caramelos, papas fritas y refrescos. CMO SE CALCULAN LOS CARBOHIDRATOS? Hay dos maneras de calcular los carbohidratos de los alimentos. Puede usar cualquiera de 1 Kamani Stlos dos mtodos o [place] combinacin de [place]. Leer la etiqueta de informacin nutricional de los alimentos envasados  La lista de informacin nutricional est incluida en las etiquetas de casi todas las bebidas y los alimentos envasados de los [place]. Incluye lo siguiente:  El tamao de la porcin.  Informacin sobre los nutrientes de cada porcin, incluidos los gramos (g) de carbohidratos por porcin. Para usar la informacin nutricional:  Decida cuntas porciones va a  comer.  Multiplique la cantidad de porciones por el nmero de carbohidratos por porcin.  El resultado es la cantidad total de carbohidratos que comer. Conocer los tamaos de las porciones estndar de otros alimentos  Cuando coma alimentos que contienen carbohidratos que no estn envasados o no incluyen la informacin nutricional en la etiqueta, debe medir las porciones para poder calcular la cantidad de carbohidratos:  Mida los alimentos que comer con una balanza de alimentos o una taza medidora, si es necesario.  Decida cuntas porciones de Programmer, systemstamao estndar comer.  Multiplique el nmero de porciones por15. La mayora de los alimentos con alto contenido de carbohidratos contienen unos 15g de carbohidratos por porcin.  Por ejemplo, si come 8onzas (170g) de fresas, habr comido 2porciones y 30g de carbohidratos (2porciones x 15g=30g).  En el caso de las comidas que contienen mezclas de ms de un alimento, como las sopas y los guisos, debe calcular los carbohidratos de cada alimento que se incluye. La siguiente World Fuel Services Corporationlista incluye los tamaos de las porciones estndar de los alimentos corrientes con alto contenido de carbohidratos. Cada una de estas porciones tiene aproximadamente 15g de carbohidratos:  pan de hamburguesa o muffin ingls.  onza (15ml) de almbar.  onza (14g) de gelatina.  1rebanada de pan.  1tortilla de seispulgadas.  3onzas (85g) de arroz o pasta cocidos.  4onzas (113g) de porotos secos cocidos.  4onzas (113g) de verduras con almidn, como frijoles, maz o papas.  4onzas (113g) de cereal caliente.  4onzas (113g) de pur de papas o de una papa grande al horno.  4onzas (113g) de frutas en lata o congeladas.  4onzas (120ml) de jugo de frutas.  4a 6galletas.  6croquetas de pollo.  6onzas (170g) de cereales secos  sin azcar.  6onzas (170g) de yogur descremado sin ningn agregado o de yogur endulzado con  edulcorante artificial.  8onzas ( ) de [place].  8onzas (170g) de frutas frescas o una fruta pequea.  24onzas (680g) de palomitas de maz. EJEMPLO DE RECUENTO DE CARBOHIDRATOS Ejemplo de comida   3onzas (85g) de pechugas de pollo.  6onzas (170g) de arroz integral.  4onzas (113g) de maz.  8onzas ( ) de leche.  8onzas (170g) de fresas con crema batida sin azcar. Clculo de carbohidratos  1. Identifique los alimentos que contienen carbohidratos:  Arroz.  Maz.  Leche.  Jinny Sanders. 1. Calcule cuntas porciones come de cada alimento:  2 porciones de arroz.  1 racin de maz.  1 porcin de leche.  1 porcin de fresas. 1. Multiplique cada nmero de porciones por 15g:  2 porciones de arroz x 15 g = 30 g.  1 porcin de maz x 15 g = 15 g.  1 porcin de leche x 15 g = 15 g.  1 porcin de fresas x 15 g = 15 g. 1. Sume todas las cantidades para conocer el total de gramos de carbohidratos consumidos:  30g + 15g + 15g + 15g = 75g de carbohidratos en total. Esta informacin no tiene como fin reemplazar el consejo del mdico. Asegrese de hacerle al mdico cualquier pregunta que tenga. Document Released: 11/28/2011 Document Revised: 09/26/2014 Document Reviewed: 02/17/2016 Elsevier Interactive Patient Education  2017 [employer].
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
## 2016-09-30 NOTE — Progress Notes (Signed)
Pain in left breast/rt breast; right arm numbness/tingling x 3 weeks. Fatigue. cbg 129 mg/dl; Z6XA1C 8.1
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
## 2016-10-01 LAB — VITAMIN D 25 HYDROXY (VIT D DEFICIENCY, FRACTURES): VIT D 25 HYDROXY: 13 ng/mL — AB (ref 30–100)

## 2016-10-03 ENCOUNTER — Other Ambulatory Visit: Payer: Self-pay | Admitting: Physician Assistant

## 2016-10-03 DIAGNOSIS — E559 Vitamin D deficiency, unspecified: Secondary | ICD-10-CM

## 2016-10-03 MED ORDER — VITAMIN D (ERGOCALCIFEROL) 1.25 MG (50000 UNIT) PO CAPS: 50000.0000 [IU] | ORAL_CAPSULE | ORAL | 0 refills | Status: DC

## 2016-10-07 ENCOUNTER — Other Ambulatory Visit (HOSPITAL_COMMUNITY): Payer: Self-pay | Admitting: *Deleted

## 2016-10-07 DIAGNOSIS — N644 Mastodynia: Secondary | ICD-10-CM

## 2016-10-11 ENCOUNTER — Ambulatory Visit: Payer: Self-pay

## 2016-10-12 ENCOUNTER — Telehealth: Payer: Self-pay

## 2016-10-12 ENCOUNTER — Encounter: Payer: Self-pay | Admitting: Internal Medicine

## 2016-10-12 ENCOUNTER — Ambulatory Visit: Payer: Medicaid Other | Attending: Internal Medicine | Admitting: Internal Medicine

## 2016-10-12 VITALS — BP 115/69 | HR 64 | Temp 98.3°F | Resp 18 | Ht 62.0 in | Wt 198.6 lb

## 2016-10-12 DIAGNOSIS — G43909 Migraine, unspecified, not intractable, without status migrainosus: Secondary | ICD-10-CM

## 2016-10-12 DIAGNOSIS — K029 Dental caries, unspecified: Secondary | ICD-10-CM | POA: Diagnosis not present

## 2016-10-12 DIAGNOSIS — R42 Dizziness and giddiness: Secondary | ICD-10-CM | POA: Insufficient documentation

## 2016-10-12 DIAGNOSIS — R51 Headache: Secondary | ICD-10-CM | POA: Insufficient documentation

## 2016-10-12 DIAGNOSIS — Z7984 Long term (current) use of oral hypoglycemic drugs: Secondary | ICD-10-CM | POA: Insufficient documentation

## 2016-10-12 DIAGNOSIS — E119 Type 2 diabetes mellitus without complications: Secondary | ICD-10-CM | POA: Insufficient documentation

## 2016-10-12 DIAGNOSIS — K219 Gastro-esophageal reflux disease without esophagitis: Secondary | ICD-10-CM | POA: Insufficient documentation

## 2016-10-12 DIAGNOSIS — J45909 Unspecified asthma, uncomplicated: Secondary | ICD-10-CM | POA: Insufficient documentation

## 2016-10-12 DIAGNOSIS — I8390 Asymptomatic varicose veins of unspecified lower extremity: Secondary | ICD-10-CM | POA: Insufficient documentation

## 2016-10-12 DIAGNOSIS — R7301 Impaired fasting glucose: Secondary | ICD-10-CM | POA: Diagnosis not present

## 2016-10-12 DIAGNOSIS — Z7982 Long term (current) use of aspirin: Secondary | ICD-10-CM | POA: Insufficient documentation

## 2016-10-12 DIAGNOSIS — F411 Generalized anxiety disorder: Secondary | ICD-10-CM | POA: Diagnosis not present

## 2016-10-12 DIAGNOSIS — E118 Type 2 diabetes mellitus with unspecified complications: Secondary | ICD-10-CM | POA: Insufficient documentation

## 2016-10-12 LAB — GLUCOSE, POCT (MANUAL RESULT ENTRY): POC GLUCOSE: 139 mg/dL — AB (ref 70–99)

## 2016-10-12 MED ORDER — RANITIDINE HCL 150 MG PO CAPS: 150.0000 mg | ORAL_CAPSULE | Freq: Every day | ORAL | 3 refills | Status: DC

## 2016-10-12 MED ORDER — OMEPRAZOLE 20 MG PO CPDR: 20.0000 mg | DELAYED_RELEASE_CAPSULE | Freq: Every day | ORAL | 3 refills | Status: DC

## 2016-10-12 MED ORDER — ASPIRIN-ACETAMINOPHEN-CAFFEINE 250-250-65 MG PO TABS: 1.0000 | ORAL_TABLET | ORAL | 3 refills | Status: DC | PRN

## 2016-10-12 MED ORDER — AMITRIPTYLINE HCL 50 MG PO TABS: 50.0000 mg | ORAL_TABLET | Freq: Every day | ORAL | 3 refills | Status: DC

## 2016-10-12 NOTE — Progress Notes (Signed)
Patient is here for DM/Anxiety  Patient complains of intermittent HA's being present over the past 3 months. Pain is scaled at a 7 currently. Patient states ibuprofen does provide relief.  Patient has taken medication today. Patient has eaten today.
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
## 2016-10-12 NOTE — Patient Instructions (Addendum)
Diabetes y cuidados del pie (Diabetes and Foot Care) La diabetes puede ser la causa de que el flujo sanguneo (circulacin) en las piernas y los pies sea deficiente. Debido a esto, la piel de los pies se torna ms delgada, se rompe con facilidad y se cura ms lentamente. La piel puede estar seca, despellejarse y Medical illustrator. Tambin pueden estar daados los nervios de las piernas y de los pies lo que provoca una disminucin de la sensibilidad. Es posible que no advierta heridas ms pequeas en los pies, que pueden causar infecciones graves. Cuidar sus pies es una de las cosas ms importantes que puede hacer por usted mismo. INSTRUCCIONES PARA EL CUIDADO EN EL HOGAR  Use siempre calzado, an dentro de su casa. No camine descalzo. Caminar descalzo facilita que se lastime.  Controle sus pies diariamente para observar ampollas, cortes y enrojecimiento. Si no puede ver la planta del pie, use un espejo o pdale ayuda a Nurse, children's.  Lave sus pies con agua tibia (no use agua caliente) y un [place]. Seque bien sus pies, y la zona [employer] dedos dando [place], hasta que estn completamente secos. Noremoje los pies, ya que esto puede resecar la piel.  Aplique una locin hidratante o vaselina (que no contenga alcohol ni perfume) en los pies y en las uas secas y [place]. No aplique locin entre los dedos.  Recorte las uas en forma recta. No escarbe debajo de las uas o alrededor [employer]. Lime los bordes de las uas con una lima o esmeril.  No corte las durezas o callosidades, ni trate de quitarlas con medicamentos.  Use calcetines de algodn o medias [employer]. Asegrese de que no le [employer]. Nouse calcetines que le lleguen a las rodillas, ya que podran disminuir el flujo de sangre a las piernas.  Use zapatos de cuero que le queden bien y que sean acolchados. Para amoldar los zapatos, clcelos slo algunas horas por da. Esto evitar lesiones en los pies. Revise  siempre los zapatos antes de ponerlos para asegurarse de que no haya objetos en su interior.  No cruce las piernas. Esto puede disminuir el flujo de sangre a los pies.  Si algo le ha raspado, cortado o lastimado la piel de los pies, mantenga la piel de esa zona limpia y [place]. Debe higienizar estas zonas con agua y un jabn suave. No limpie la zona con agua oxigenada, alcohol ni yodo.  Cuando se quite un vendaje adhesivo, asegrese de no daar la piel.  Si tiene una herida, obsrvela varias veces por da para asegurarse de que se est curando.  No use bolsas de agua caliente ni almohadillas trmicas. Podran causar quemaduras. Si ha perdido la sensibilidad en los pies o las piernas, no sabr lo que le est sucediendo hasta que sea demasiado tarde.  Asegrese de que su mdico le haga un examen completo de los pies por lo menos una vez al ao, o con ms frecuencia si usted tiene [employer]. Informe todos los cortes, llagas o moretones a su mdico inmediatamente. SOLICITE ATENCIN MDICA SI:  Tiene una lesin que no se cura.  Tiene cortes o rajaduras en la piel.  Tiene una ua encarnada.  Nota una zona irritada en las piernas o los pies.  Siente una sensacin de ardor u hormigueo en las piernas o los pies.  Siente dolor o calambres en las piernas o los pies.  Las piernas o los pies estn adormecidos.  Siente los  pies siempre fros. SOLICITE ATENCIN MDICA DE INMEDIATO SI:  Presenta enrojecimiento, hinchazn o aumento del dolor en una herida.  Nota una lnea roja que sube por pierna.  Aparece pus en la herida.  Le sube la fiebre o segn lo que le indique el mdico.  Advierte un olor ftido que proviene de una lcera o una herida. Esta informacin no tiene Theme park managercomo fin reemplazar el consejo del mdico. Asegrese de hacerle al mdico cualquier pregunta que tenga. Document Released: 09/05/2005 Document Revised: 12/28/2015 Document Reviewed: 02/12/2013 Elsevier Interactive  Patient Education  2017 Elsevier Inc. Generalized Anxiety Disorder Generalized anxiety disorder (GAD) is a mental disorder. It interferes with life functions, including relationships, work, and school. GAD is different from normal anxiety, which everyone experiences at some point in their lives in response to specific life events and activities. Normal anxiety actually helps us prepare for and get through these life events and activities. Normal anxiety goes away after the event or activity is over.  GAD causes anxiety that is not necessarily related to specific events or activities. It also causes excess anxiety in proportion to specific events or activities. The anxiety associated with GAD is also difficult to control. GAD can vary from mild to severe. People with severe GAD can have intense waves of anxiety with physical symptoms (panic attacks).  SYMPTOMS The anxiety and worry associated with GAD are difficult to control. This anxiety and worry are related to many life events and activities and also occur more days than not for 6 months or longer. People with GAD also have three or more of the following symptoms (one or more in children):  Restlessness.   Fatigue.  Difficulty concentrating.   Irritability.  Muscle tension.  Difficulty sleeping or unsatisfying sleep. DIAGNOSIS GAD is diagnosed through an assessment by your health care provider. Your health care provider will ask you questions aboutyour mood,physical symptoms, and events in your life. Your health care provider may ask you about your medical history and use of alcohol or drugs, including prescription medicines. Your health care provider may also do a physical exam and blood tests. Certain medical conditions and the use of certain substances can cause symptoms similar to those associated with GAD. Your health care provider may refer you to a mental health specialist for further evaluation. TREATMENT The following therapies  are usually used to treat GAD:   Medication. Antidepressant medication usually is prescribed for long-term daily control. Antianxiety medicines may be added in severe cases, especially when panic attacks occur.   Talk therapy (psychotherapy). Certain types of talk therapy can be helpful in treating GAD by providing support, education, and guidance. A form of talk therapy called cognitive behavioral therapy can teach you healthy ways to think about and react to daily life events and activities.  Stress managementtechniques. These include yoga, meditation, and exercise and can be very helpful when they are practiced regularly. A mental health specialist can help determine which treatment is best for you. Some people see improvement with one therapy. However, other people require a combination of therapies. This information is not intended to replace advice given to you by your health care provider. Make sure you discuss any questions you have with your health care provider. Document Released: 12/31/2012 Document Revised: 09/26/2014 Document Reviewed: 12/31/2012 Elsevier Interactive Patient Education  2017 [employer]

## 2016-10-12 NOTE — Telephone Encounter (Signed)
[employer] [place] Id: [id number] Contacted pt to go over lab results pt is aware of results and doesn't have any questions or concerns

## 2016-10-13 LAB — MICROALBUMIN / CREATININE URINE RATIO
Creatinine, Urine: 12 mg/dL — ABNORMAL LOW (ref 20–320)
Microalb Creat Ratio: 17 mcg/mg creat (ref <30)
Microalb, Ur: 0.2 mg/dL

## 2016-10-21 ENCOUNTER — Ambulatory Visit: Payer: Self-pay | Attending: Internal Medicine

## 2016-10-25 ENCOUNTER — Emergency Department (HOSPITAL_COMMUNITY): Payer: Self-pay

## 2016-10-25 ENCOUNTER — Emergency Department (HOSPITAL_COMMUNITY)
Admission: EM | Admit: 2016-10-25 | Discharge: 2016-10-25 | Disposition: A | Discharge status: Home / Self Care | Payer: Self-pay | Attending: Physician Assistant | Admitting: Physician Assistant

## 2016-10-25 ENCOUNTER — Encounter (HOSPITAL_COMMUNITY): Payer: Self-pay | Admitting: Emergency Medicine

## 2016-10-25 DIAGNOSIS — M79601 Pain in right arm: Secondary | ICD-10-CM | POA: Insufficient documentation

## 2016-10-25 DIAGNOSIS — Z7984 Long term (current) use of oral hypoglycemic drugs: Secondary | ICD-10-CM | POA: Insufficient documentation

## 2016-10-25 DIAGNOSIS — Y929 Unspecified place or not applicable: Secondary | ICD-10-CM | POA: Insufficient documentation

## 2016-10-25 DIAGNOSIS — Z7982 Long term (current) use of aspirin: Secondary | ICD-10-CM | POA: Insufficient documentation

## 2016-10-25 DIAGNOSIS — J45909 Unspecified asthma, uncomplicated: Secondary | ICD-10-CM | POA: Insufficient documentation

## 2016-10-25 DIAGNOSIS — E119 Type 2 diabetes mellitus without complications: Secondary | ICD-10-CM | POA: Insufficient documentation

## 2016-10-25 DIAGNOSIS — Y999 Unspecified external cause status: Secondary | ICD-10-CM | POA: Insufficient documentation

## 2016-10-25 DIAGNOSIS — Y939 Activity, unspecified: Secondary | ICD-10-CM | POA: Insufficient documentation

## 2016-10-25 MED ORDER — HYDROCODONE-ACETAMINOPHEN 5-325 MG PO TABS: 1.0000 | ORAL_TABLET | Freq: Four times a day (QID) | ORAL | 0 refills | Status: DC | PRN

## 2016-10-25 MED ORDER — IBUPROFEN 200 MG PO TABS
600.0000 mg | ORAL_TABLET | Freq: Once | ORAL | Status: AC
  Administered 2016-10-25: 600 mg via ORAL
  Filled 2016-10-25: qty 3

## 2016-10-25 MED ORDER — HYDROCODONE-ACETAMINOPHEN 5-325 MG PO TABS
1.0000 | ORAL_TABLET | Freq: Once | ORAL | Status: AC
  Administered 2016-10-25: 1 via ORAL
  Filled 2016-10-25: qty 1

## 2016-10-25 NOTE — Discharge Instructions (Signed)
Your x-ray shows no signs of fracture. However this does not look for any muscle or tendon problems. Will place you in a wrist splint and a sling. I encourage you to rest, ice, elevate your right arm. Have given you a short course of pain medicine. May also take Motrin. Please follow-up with the primary care doctor. I will also give a referral to the orthopedist that you may use if her symptoms do not improve in the next 3-4 days.
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
## 2016-10-28 ENCOUNTER — Ambulatory Visit: Payer: Medicaid Other | Attending: Internal Medicine | Admitting: Physician Assistant

## 2016-10-28 VITALS — BP 129/84 | HR 79 | Temp 98.4°F | Resp 16 | Wt 195.2 lb

## 2016-10-28 DIAGNOSIS — E118 Type 2 diabetes mellitus with unspecified complications: Secondary | ICD-10-CM | POA: Insufficient documentation

## 2016-10-28 DIAGNOSIS — S46911A Strain of unspecified muscle, fascia and tendon at shoulder and upper arm level, right arm, initial encounter: Secondary | ICD-10-CM

## 2016-10-28 DIAGNOSIS — K219 Gastro-esophageal reflux disease without esophagitis: Secondary | ICD-10-CM | POA: Insufficient documentation

## 2016-10-28 DIAGNOSIS — Z7984 Long term (current) use of oral hypoglycemic drugs: Secondary | ICD-10-CM | POA: Insufficient documentation

## 2016-10-28 DIAGNOSIS — J45909 Unspecified asthma, uncomplicated: Secondary | ICD-10-CM | POA: Insufficient documentation

## 2016-10-28 LAB — GLUCOSE, POCT (MANUAL RESULT ENTRY): POC Glucose: 201 mg/dl — AB (ref 70–99)

## 2016-10-28 MED ORDER — NAPROXEN 500 MG PO TABS: 500.0000 mg | ORAL_TABLET | Freq: Two times a day (BID) | ORAL | 0 refills | Status: DC

## 2016-10-28 MED ORDER — METHOCARBAMOL 500 MG PO TABS: 500.0000 mg | ORAL_TABLET | Freq: Four times a day (QID) | ORAL | 0 refills | Status: DC

## 2016-10-28 NOTE — Patient Instructions (Signed)
Practice Range of motion exercises for shoulder.
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
## 2016-11-10 ENCOUNTER — Ambulatory Visit
Admission: RE | Admit: 2016-11-10 | Discharge: 2016-11-10 | Disposition: A | Discharge status: Home / Self Care | Payer: No Typology Code available for payment source | Source: Ambulatory Visit | Attending: Obstetrics and Gynecology | Admitting: Obstetrics and Gynecology

## 2016-11-10 ENCOUNTER — Encounter (HOSPITAL_COMMUNITY): Payer: Self-pay

## 2016-11-10 ENCOUNTER — Ambulatory Visit (HOSPITAL_COMMUNITY)
Admission: RE | Admit: 2016-11-10 | Discharge: 2016-11-10 | Disposition: A | Discharge status: Home / Self Care | Payer: Self-pay | Source: Ambulatory Visit | Attending: Obstetrics and Gynecology | Admitting: Obstetrics and Gynecology

## 2016-11-10 VITALS — BP 112/76 | Temp 98.7°F | Ht 61.0 in | Wt 193.6 lb

## 2016-11-10 DIAGNOSIS — N6452 Nipple discharge: Secondary | ICD-10-CM

## 2016-11-10 DIAGNOSIS — N644 Mastodynia: Secondary | ICD-10-CM

## 2016-11-10 DIAGNOSIS — Z01419 Encounter for gynecological examination (general) (routine) without abnormal findings: Secondary | ICD-10-CM

## 2016-11-10 NOTE — Addendum Note (Signed)
Encounter addended by: Priscille Heidelberghristine P Detra Bores, RN on: 11/10/2016  2:44 PM<BR>    Actions taken: Sign clinical note

## 2016-11-10 NOTE — Patient Instructions (Signed)
Explained breast self awareness with Alexandra Aguirre. Let patient know if today's Pap smear is normal that her next Pap smear is due in one year due to her recent history of an abnormal Pap smear. [facility] for a bilateral diagnostic mammogram and possible breast ultrasounds. Appointment scheduled for Thursday, November 10, 2016 at 1130.  Let patient know will follow up with her within the next couple weeks with results of Pap smear by phone. Alexandra Aguirre verbalized understanding.  Barclay Lennox, Kathaleen Maserhristine Poll, RN 1:09 PM

## 2016-11-10 NOTE — Progress Notes (Addendum)
Complaints of bilateral breast pain x 6 months that comes and goes. Patient rates the pain at a 8 out of 10. Complaints of left breast discharge when expresses.  Pap Smear: Pap smear completed today. [facility]. Patient had a follow up colposcopy and endometrial biopsy that were benign. 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 A LEEP was completed 12/29/2003 for follow up of abnormal colposcopy. Last Pap smear result is scanned in EPIC under media.  Physical exam: Breasts Breasts symmetrical. No skin abnormalities bilateral breasts. No nipple retraction bilateral breasts. Clear colored discharge expressed from left breast and a clear/yellowish discharge expressed from right breast. Sample of discharge from both breasts sent to cytology for evaluation. No lymphadenopathy. No lumps palpated bilateral breasts. Complaints of left outer and center breast pain on exam. Referred [facility] to [facility] for a bilateral diagnostic mammogram and possible breast ultrasounds. Appointment scheduled for Thursday, November 10, 2016 at 1130.  Pelvic/Bimanual   Ext Genitalia No lesions, no swelling and no discharge observed on external genitalia.         Vagina Vagina pink and normal texture. No lesions or discharge observed in vagina.          Cervix Cervix is present. Cervix pink and of normal texture. No discharge observed.     Uterus Uterus is present and palpable. Uterus in normal position and normal size.        Adnexae Bilateral ovaries present and palpable. Complaints of right ovary tenderness on exam. Patient stated she has been having pain within the right lower quadrant where ovary is located. Will [facility] for follow up.     Rectovaginal No rectal  exam completed today since patient had no rectal complaints. No skin abnormalities observed on exam.    Smoking History: Patient has never smoked.  Patient Navigation: Patient education provided. Access to services provided for patient through [facility].

## 2016-11-11 ENCOUNTER — Encounter (HOSPITAL_COMMUNITY): Payer: Self-pay | Admitting: *Deleted

## 2016-11-14 LAB — CYTOLOGY - PAP
DIAGNOSIS: NEGATIVE
HPV (WINDOPATH): NOT DETECTED

## 2016-11-23 ENCOUNTER — Ambulatory Visit: Payer: Medicaid Other | Attending: Internal Medicine | Admitting: Internal Medicine

## 2016-11-23 ENCOUNTER — Telehealth (HOSPITAL_COMMUNITY): Payer: Self-pay | Admitting: *Deleted

## 2016-11-23 ENCOUNTER — Encounter: Payer: Self-pay | Admitting: Internal Medicine

## 2016-11-23 ENCOUNTER — Ambulatory Visit: Payer: Self-pay | Admitting: Licensed Clinical Social Worker

## 2016-11-23 VITALS — BP 93/62 | HR 75 | Temp 98.3°F | Resp 18 | Ht 62.0 in | Wt 197.2 lb

## 2016-11-23 DIAGNOSIS — F411 Generalized anxiety disorder: Secondary | ICD-10-CM | POA: Diagnosis not present

## 2016-11-23 DIAGNOSIS — M25511 Pain in right shoulder: Secondary | ICD-10-CM | POA: Diagnosis not present

## 2016-11-23 DIAGNOSIS — S46911A Strain of unspecified muscle, fascia and tendon at shoulder and upper arm level, right arm, initial encounter: Secondary | ICD-10-CM

## 2016-11-23 DIAGNOSIS — J45909 Unspecified asthma, uncomplicated: Secondary | ICD-10-CM | POA: Insufficient documentation

## 2016-11-23 DIAGNOSIS — K219 Gastro-esophageal reflux disease without esophagitis: Secondary | ICD-10-CM | POA: Diagnosis not present

## 2016-11-23 DIAGNOSIS — E118 Type 2 diabetes mellitus with unspecified complications: Secondary | ICD-10-CM | POA: Diagnosis not present

## 2016-11-23 DIAGNOSIS — R42 Dizziness and giddiness: Secondary | ICD-10-CM | POA: Insufficient documentation

## 2016-11-23 DIAGNOSIS — Z7984 Long term (current) use of oral hypoglycemic drugs: Secondary | ICD-10-CM | POA: Insufficient documentation

## 2016-11-23 DIAGNOSIS — Z7982 Long term (current) use of aspirin: Secondary | ICD-10-CM | POA: Insufficient documentation

## 2016-11-23 DIAGNOSIS — M79601 Pain in right arm: Secondary | ICD-10-CM | POA: Insufficient documentation

## 2016-11-23 DIAGNOSIS — E119 Type 2 diabetes mellitus without complications: Secondary | ICD-10-CM | POA: Insufficient documentation

## 2016-11-23 LAB — GLUCOSE, POCT (MANUAL RESULT ENTRY): POC Glucose: 218 mg/dl — AB (ref 70–99)

## 2016-11-23 MED ORDER — NAPROXEN 500 MG PO TABS: 500.0000 mg | ORAL_TABLET | Freq: Two times a day (BID) | ORAL | 0 refills | Status: DC

## 2016-11-23 MED ORDER — BUSPIRONE HCL 10 MG PO TABS: 10.0000 mg | ORAL_TABLET | Freq: Two times a day (BID) | ORAL | 3 refills | Status: DC

## 2016-11-23 MED ORDER — OMEPRAZOLE 20 MG PO CPDR: 20.0000 mg | DELAYED_RELEASE_CAPSULE | Freq: Every day | ORAL | 3 refills | Status: DC
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
## 2016-11-23 NOTE — Telephone Encounter (Signed)
Telephoned patient at home number and advised of negative pap smear results. HPV was negative. Next pap smear due in one year due to history. Patient voiced understanding. Used interpreter Delorise RoyalsJulie Sowell.

## 2016-11-23 NOTE — Progress Notes (Signed)
Patient is here for Sprain FU  Patient complains of bruising and pain in the right arm scaled at a 10.  Patient has taken medication today. Patient has eaten today.
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
## 2016-12-02 ENCOUNTER — Ambulatory Visit (HOSPITAL_COMMUNITY)
Admission: RE | Admit: 2016-12-02 | Discharge: 2016-12-02 | Disposition: A | Discharge status: Home / Self Care | Payer: Self-pay | Source: Ambulatory Visit | Attending: Internal Medicine | Admitting: Internal Medicine

## 2016-12-02 DIAGNOSIS — M25511 Pain in right shoulder: Secondary | ICD-10-CM | POA: Insufficient documentation

## 2016-12-02 DIAGNOSIS — M12811 Other specific arthropathies, not elsewhere classified, right shoulder: Secondary | ICD-10-CM | POA: Insufficient documentation

## 2016-12-08 ENCOUNTER — Telehealth: Payer: Self-pay | Admitting: Internal Medicine

## 2016-12-08 NOTE — Telephone Encounter (Signed)
Patient called the office to speak with nurse regarding her results from her MRI. Please follow up.  Thank you.

## 2016-12-08 NOTE — Telephone Encounter (Signed)
Patient is requesting the results from her MRI

## 2016-12-15 NOTE — Progress Notes (Signed)
Patient is aware of these results.

## 2016-12-21 ENCOUNTER — Encounter: Payer: Self-pay | Admitting: Obstetrics & Gynecology

## 2016-12-21 ENCOUNTER — Ambulatory Visit (INDEPENDENT_AMBULATORY_CARE_PROVIDER_SITE_OTHER): Payer: Medicaid Other | Admitting: Obstetrics & Gynecology

## 2016-12-21 VITALS — BP 97/50 | HR 80 | Ht 63.0 in | Wt 195.4 lb

## 2016-12-21 DIAGNOSIS — N8189 Other female genital prolapse: Secondary | ICD-10-CM | POA: Diagnosis not present

## 2016-12-21 DIAGNOSIS — R102 Pelvic and perineal pain: Secondary | ICD-10-CM | POA: Diagnosis not present

## 2016-12-21 NOTE — Progress Notes (Signed)
Patient states right now not having pain. Patient states when she does have the pain  it comes and goes and its in her lower part of her stomach in the middle with lots of pressure, and it comes at different times but never when she is sleeping.

## 2016-12-21 NOTE — Patient Instructions (Signed)
Dolor plvico en la mujer (Pelvic Pain, Female) El dolor plvico se percibe en la parte baja del abdomen, por debajo del ombligo y [employer]. El dolor puede comenzar de forma repentina (agudo), reaparecer (recurrente) o durar mucho tiempo (crnico). Se considera que el dolor plvico que dura ms de seis meses es crnico. El dolor plvico puede afectar lo siguiente:  Los rganos genitales.  El sistema urinario.  El tubo digestivo.  El sistema musculoesqueltico. El dolor plvico tiene muchas causas posibles. A veces, puede ser el resultado de afecciones digestivas o urinarias, distensiones de msculos o ligamentos, o incluso trastornos genitales. A veces, la causa no se conoce. INSTRUCCIONES PARA EL CUIDADO EN EL HOGAR  Tome los medicamentos de venta libre y los recetados solamente como se lo haya indicado el mdico.  Haga reposo como se lo haya indicado el mdico.  No tenga relaciones sexuales si le causan dolor.  Lleve un registro del dolor plvico. Escriba los siguientes datos:  Cundo comenz Chief Technology Officer.  La ubicacin del dolor.  Qu cosas parecen Teacher, English as a foreign language, como los alimentos o la [place].  Los sntomas que tiene junto con Chief Technology Officer.  Concurra a todas las visitas de control como se lo haya indicado el mdico. Esto es importante. SOLICITE ATENCIN MDICA SI:  Los medicamentos no Tourist information centre manager.  El dolor reaparece.  Aparecen nuevos sntomas.  Tiene secrecin o sangrado vaginal anormal, incluido sangrado despus de la menopausia.  Tiene fiebre o siente escalofros.  Tiene estreimiento.  Observa sangre en la orina o en la materia fecal.  La orina tiene mal olor.  Se siente mareada o dbil. SOLICITE ATENCIN MDICA DE INMEDIATO SI:  Siente dolor intenso repentinamente.  El dolor es cada vez ms intenso.  Tiene dolor intenso junto con fiebre, nuseas, vmitos o exceso de sudoracin.  Pierde la conciencia. Esta  informacin no tiene Theme park manager el consejo del mdico. Asegrese de hacerle al mdico cualquier pregunta que tenga. Document Released: 12/02/2008 Document Revised: 01/20/2015 Document Reviewed: 06/26/2015 Elsevier Interactive Patient Education  2017 [employer].

## 2016-12-21 NOTE — Progress Notes (Signed)
History:  41 y.o. [id number] here today for eval of pelvic pain. She reports that she has a 10 year IUD and does have cramping with her cycles but, she also reports pain outside of her cycles for the past month and a half.  She denies intermenstrual bleeding or abnormal discharge.   She reports a pulling sensation in her pelvis and pressure  She reports that it is difficult for her to concentrate due to her distraction. Her son was recently removed from her custody. She reports that he has been on drugs and has 'broken the law' due to his drug use. She and her other children are in counseling.  She feels like she took him ot get help but, he got in trouble with the law instead.     The following portions of the patient's history were reviewed and updated as appropriate: allergies, current medications, past family history, past medical history, past social history, past surgical history and problem list.  Review of Systems:  Pertinent items are noted in HPI.   Objective:  Physical Exam Blood pressure (!) 97/50, pulse 80, height  (1.6 m), weight 195 lb 6.4 oz (88.6 kg). BP (!) 97/50   Pulse 80   Ht  (1.6 m)   Wt 195 lb 6.4 oz (88.6 kg)   BMI 34.61 kg/m  CONSTITUTIONAL: Well-developed, well-nourished female in no acute distress.  HENT:  Normocephalic, atraumatic EYES: Conjunctivae and EOM are normal. No scleral icterus.  NECK: Normal range of motion SKIN: Skin is warm and dry. No rash noted. Not diaphoretic.No pallor. NEUROLGIC: Alert and oriented to person, place, and time. Normal coordination.  Lungs: CTA CV: RRR Abd; obese, NT, ND GU: EGBUS: no lesions Vagina: no blood in vault Cervix: no lesion; no mucopurulent d/c Uterus: small, mobile; Grade II prolape Adnexa: no masses; sl tender on left- exam challenging due to body habitus    Assessment & Plan:  Pelvic pain in female-  Need [place] to eval for possible ovarian pathology Pelvic organ prolapse- no tx needed at present     rec meeting with behaviorist to discuss possible referral to Spanish speaking interpreter. Naprosyn prn f/u in 3 months or sooner prn  Dragon Thrush L. Harraway-Smith, M.D., [place]

## 2016-12-28 ENCOUNTER — Ambulatory Visit (HOSPITAL_COMMUNITY)
Admission: RE | Admit: 2016-12-28 | Discharge: 2016-12-28 | Disposition: A | Discharge status: Home / Self Care | Payer: Self-pay | Source: Ambulatory Visit | Attending: Obstetrics & Gynecology | Admitting: Obstetrics & Gynecology

## 2016-12-28 ENCOUNTER — Ambulatory Visit (INDEPENDENT_AMBULATORY_CARE_PROVIDER_SITE_OTHER): Payer: Self-pay | Admitting: Clinical

## 2016-12-28 DIAGNOSIS — N854 Malposition of uterus: Secondary | ICD-10-CM | POA: Insufficient documentation

## 2016-12-28 DIAGNOSIS — F411 Generalized anxiety disorder: Secondary | ICD-10-CM

## 2016-12-28 DIAGNOSIS — Z975 Presence of (intrauterine) contraceptive device: Secondary | ICD-10-CM | POA: Insufficient documentation

## 2016-12-28 DIAGNOSIS — R102 Pelvic and perineal pain: Secondary | ICD-10-CM | POA: Insufficient documentation

## 2016-12-28 NOTE — BH Specialist Note (Signed)
Integrated Behavioral Health Initial Visit  MRN: [id number] Name: Alexandra Aguirre   Session Start time: 11:45 Session End time: 12:15 Total time: 45 minutes  Type of Service: Integrated Behavioral Health- Individual/Family Interpretor:Yes.   Interpretor Name and Language: Spanish   Warm Hand Off Completed.       SUBJECTIVE: Alexandra Aguirre is a 41 y.o. female accompanied by patient. Patient was referred by Dr Erin Fulling for anxiety, depression. Patient reports the following symptoms/concerns: Pt primary concern is constant worry since her 14yo son was put in jail, as language barrier is preventing her from understanding what is going to happen to her son; also worries about the effect of son's actions on family. Pt copes by praying and listening to music. Duration of problem: Less than one month; Severity of problem: severe  OBJECTIVE: Mood: Anxious and Affect: Tearful Risk of harm to self or others: No plan to harm self or others   LIFE CONTEXT: Family and Social: Lives with husband and 10yo; 14yo was put in jail, stayed with an uncle, and back in jail for driving a vehicle without a licence and drug use. Also has three adult children, all doing well. 10yo loves creating art; mom is looking for art activities locally. School/Work: Unemployed Self-Care: Sleeps well, no substances. Pt has poor appetite in past month, stemming from worry. Life Changes: Household changes since son began using substances and being arrested at least twice  GOALS ADDRESSED: Patient will reduce symptoms of: anxiety and stress and increase knowledge and/or ability of: coping skills and also: Increase healthy adjustment to current life circumstances   INTERVENTIONS: Motivational Interviewing, Psychoeducation and/or Health Education and Link to [facility]  Standardized Assessments completed: GAD-7 and PHQ 9  ASSESSMENT: Patient currently experiencing Generalized anxiety disorder. Patient may  benefit from psychoeducation and brief therapeutic intervention regarding coping with symptoms of anxiety, along with community resources.  PLAN: 1. Follow up with behavioral health clinician on : As needed 2. Behavioral recommendations:  -Attend court hearing on 12-29-16; consider bringing own interpretor, if one is not provided, to determine judge ruling on outpatient treatment for son -Consider contacting either [facility] [place] or [facility] for individual and/or ongoing family counseling -Consider taking 41yo to [facility] free Family Night on 01/04/17; make art together  -Read [facility] information sheets on coping with depression/anxiety 3. Referral(s): Integrated Art gallery manager (In Clinic), Community Resources:  [facility] and Counselor 4. "From scale of 1-10, how likely are you to follow plan?": 8  Rae Lips, LCSWA  Depression screen [facility] 2/9 12/28/2016 12/21/2016 11/23/2016 10/12/2016 09/30/2016  Decreased Interest Down, Depressed, Hopeless 0 1  PHQ - 2 Score Altered sleeping Tired, decreased energy Change in appetite - Feeling bad or failure about yourself  1 3 0 0 1  Trouble concentrating Moving slowly or fidgety/restless Suicidal thoughts 0 0 0 0 0  PHQ-9 Score GAD 7 : Generalized Anxiety Score 12/28/2016 12/21/2016 11/23/2016 10/28/2016  Nervous, Anxious, on Edge 3 0 2 (No Data)  Control/stop worrying (No Data)  Worry too much - different things (No Data)  Trouble relaxing (No Data)  Restless (No Data)  Easily annoyed or irritable 0 0 2 (No Data)  Afraid - awful might happen (No Data)  Total GAD 7 Score -

## 2017-01-04 ENCOUNTER — Telehealth: Payer: Self-pay | Admitting: *Deleted

## 2017-01-04 NOTE — Telephone Encounter (Signed)
Per message from Dr. Penne Lash need to call patient and tell her US shows IUD is in correct place.  I called Alexandra Aguirre with [employer] [phone] to home number and left message we are calling with some information- please call our office. Also called her mobile number and gave her the results - she voices understanding.  Has followup with her PCP scheduled.

## 2017-02-28 ENCOUNTER — Ambulatory Visit: Payer: Self-pay | Admitting: Internal Medicine

## 2017-03-01 ENCOUNTER — Encounter: Payer: Self-pay | Admitting: Internal Medicine

## 2017-03-01 ENCOUNTER — Ambulatory Visit: Payer: Self-pay | Attending: Internal Medicine | Admitting: Internal Medicine

## 2017-03-01 VITALS — BP 105/68 | HR 81 | Temp 98.5°F | Resp 18 | Ht 63.0 in | Wt 198.0 lb

## 2017-03-01 DIAGNOSIS — S46911A Strain of unspecified muscle, fascia and tendon at shoulder and upper arm level, right arm, initial encounter: Secondary | ICD-10-CM | POA: Insufficient documentation

## 2017-03-01 DIAGNOSIS — Z79899 Other long term (current) drug therapy: Secondary | ICD-10-CM | POA: Insufficient documentation

## 2017-03-01 DIAGNOSIS — E669 Obesity, unspecified: Secondary | ICD-10-CM | POA: Insufficient documentation

## 2017-03-01 DIAGNOSIS — Z7984 Long term (current) use of oral hypoglycemic drugs: Secondary | ICD-10-CM | POA: Insufficient documentation

## 2017-03-01 DIAGNOSIS — F411 Generalized anxiety disorder: Secondary | ICD-10-CM | POA: Insufficient documentation

## 2017-03-01 DIAGNOSIS — X58XXXA Exposure to other specified factors, initial encounter: Secondary | ICD-10-CM | POA: Insufficient documentation

## 2017-03-01 DIAGNOSIS — K029 Dental caries, unspecified: Secondary | ICD-10-CM | POA: Insufficient documentation

## 2017-03-01 DIAGNOSIS — Z6835 Body mass index (BMI) 35.0-35.9, adult: Secondary | ICD-10-CM | POA: Insufficient documentation

## 2017-03-01 DIAGNOSIS — E118 Type 2 diabetes mellitus with unspecified complications: Secondary | ICD-10-CM | POA: Insufficient documentation

## 2017-03-01 DIAGNOSIS — J45909 Unspecified asthma, uncomplicated: Secondary | ICD-10-CM | POA: Insufficient documentation

## 2017-03-01 DIAGNOSIS — K219 Gastro-esophageal reflux disease without esophagitis: Secondary | ICD-10-CM | POA: Insufficient documentation

## 2017-03-01 DIAGNOSIS — E559 Vitamin D deficiency, unspecified: Secondary | ICD-10-CM | POA: Insufficient documentation

## 2017-03-01 LAB — POCT GLYCOSYLATED HEMOGLOBIN (HGB A1C): Hemoglobin A1C: 7.7

## 2017-03-01 LAB — GLUCOSE, POCT (MANUAL RESULT ENTRY): POC Glucose: 262 mg/dl — AB (ref 70–99)

## 2017-03-01 MED ORDER — NAPROXEN 500 MG PO TABS: 500.0000 mg | ORAL_TABLET | Freq: Two times a day (BID) | ORAL | 0 refills | Status: DC

## 2017-03-01 MED ORDER — BUSPIRONE HCL 10 MG PO TABS: 10.0000 mg | ORAL_TABLET | Freq: Two times a day (BID) | ORAL | 3 refills | Status: DC

## 2017-03-01 MED ORDER — VITAMIN D (ERGOCALCIFEROL) 1.25 MG (50000 UNIT) PO CAPS: 50000.0000 [IU] | ORAL_CAPSULE | ORAL | 0 refills | Status: DC

## 2017-03-01 MED ORDER — AMITRIPTYLINE HCL 50 MG PO TABS: 50.0000 mg | ORAL_TABLET | Freq: Every day | ORAL | 3 refills | Status: DC

## 2017-03-01 MED ORDER — OMEPRAZOLE 20 MG PO CPDR: 20.0000 mg | DELAYED_RELEASE_CAPSULE | Freq: Every day | ORAL | 3 refills | Status: DC

## 2017-03-01 MED ORDER — METHOCARBAMOL 500 MG PO TABS: 500.0000 mg | ORAL_TABLET | Freq: Four times a day (QID) | ORAL | 0 refills | Status: DC

## 2017-03-01 MED ORDER — RANITIDINE HCL 150 MG PO CAPS: 150.0000 mg | ORAL_CAPSULE | Freq: Every day | ORAL | 3 refills | Status: DC

## 2017-03-01 MED ORDER — METFORMIN HCL 1000 MG PO TABS: 1000.0000 mg | ORAL_TABLET | Freq: Two times a day (BID) | ORAL | 3 refills | Status: DC
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
## 2017-03-01 NOTE — Patient Instructions (Signed)
La diabetes mellitus y los alimentos (Diabetes Mellitus and Food) Es importante que controle su nivel de azcar en la sangre (glucosa). El nivel de glucosa en sangre depende en gran medida de lo que usted come. Comer alimentos saludables en las cantidades [place] a lo largo del Training and development officer, aproximadamente a la misma hora [employer], lo ayudar a Chief Technology Officer su nivel de Multimedia programmer. [place] puede ayudarlo a retrasar o Patent attorney de la diabetes mellitus. Comer de [employer] saludable incluso puede ayudarlo a Chartered loss adjuster de presin arterial y a Science writer o Theatre manager un peso saludable. Entre las recomendaciones generales para alimentarse y Audiological scientist los alimentos de forma saludable, se incluyen las siguientes:  Respetar las comidas principales y comer colaciones con regularidad. Evitar pasar largos perodos sin comer con el fin de perder peso.  Seguir una dieta que consista principalmente en alimentos de origen vegetal, como frutas, vegetales, frutos secos, legumbres y cereales integrales.  Utilizar mtodos de coccin a baja temperatura, como hornear, en lugar de mtodos de coccin a alta temperatura, como frer en abundante aceite. Trabaje con el nutricionista para aprender a Financial planner nutricional de las etiquetas de los alimentos. CMO PUEDEN AFECTARME LOS ALIMENTOS? Carbohidratos Los carbohidratos afectan el nivel de glucosa en sangre ms que cualquier otro tipo de alimento. El nutricionista lo ayudar a Teacher, adult education cuntos carbohidratos puede consumir en cada comida y ensearle a contarlos. El recuento de carbohidratos es importante para mantener la glucosa en sangre en un nivel saludable, en especial si utiliza insulina o toma determinados medicamentos para la diabetes mellitus. Alcohol El alcohol puede provocar disminuciones sbitas de la glucosa en sangre (hipoglucemia), en especial si utiliza insulina o toma determinados medicamentos para la diabetes mellitus. La  hipoglucemia es una afeccin que puede poner en peligro la vida. Los sntomas de la hipoglucemia (somnolencia, mareos y Data processing manager) son similares a los sntomas de haber consumido mucho alcohol. Si el mdico lo autoriza a beber alcohol, hgalo con moderacin y siga estas pautas:  Las mujeres no deben beber ms de un trago por da, y los hombres no deben beber ms de dos tragos por Training and development officer. Un trago es igual a: ? 12 onzas (355 ml) de cerveza ? 5 onzas de vino (150 ml) de vino ? 1,5onzas (41ml) de bebidas espirituosas  No beba con el estmago vaco.  Mantngase hidratado. Beba agua, gaseosas dietticas o t helado sin azcar.  Las gaseosas comunes, los jugos y otros refrescos podran contener muchos carbohidratos y se Civil Service fast streamer. QU ALIMENTOS NO SE RECOMIENDAN? Cuando haga las elecciones de alimentos, es importante que recuerde que todos los alimentos son distintos. Algunos tienen menos nutrientes que otros por porcin, aunque podran tener la misma cantidad de caloras o carbohidratos. Es difcil darle al cuerpo lo que necesita cuando consume alimentos con menos nutrientes. Estos son algunos ejemplos de alimentos que debera evitar ya que contienen muchas caloras y carbohidratos, pero pocos nutrientes:  Physicist, medical trans (la mayora de los alimentos procesados incluyen grasas trans en la etiqueta de Informacin nutricional).  Gaseosas comunes.  Jugos.  Caramelos.  Dulces, como tortas, pasteles, rosquillas y [place].  Comidas fritas. QU ALIMENTOS PUEDO COMER? Consuma alimentos ricos en nutrientes, que nutrirn el cuerpo y lo mantendrn saludable. Los alimentos que debe comer tambin dependern de varios factores, como:  Las caloras que necesita.  Los medicamentos que toma.  Su peso.  El nivel de glucosa en [place].  El [place] de presin arterial.  El nivel de colesterol. Debe consumir  una amplia variedad de alimentos, por ejemplo:  Protenas. ? Cortes de [employer]. ? Protenas con bajo contenido de grasas saturadas, como pescado, clara de huevo y frijoles. Evite las carnes procesadas.  Frutas y vegetales. ? [place] y Photographer que pueden ayudar a [employer] niveles sanguneos de [place], como [place], mangos y batatas.  Productos lcteos. ? Elija productos lcteos sin grasa o con bajo contenido de [place], como [place], yogur y [address].  Cereales, panes, pastas y arroz. ? Elija cereales integrales, como panes multicereales, avena en grano y arroz integral. Estos alimentos pueden ayudar a controlar la presin arterial.  Daphene Jaeger. ? Alimentos que contengan grasas saludables, como frutos secos, Musician, aceite de [place], aceite de canola y pescado. TODOS LOS QUE PADECEN DIABETES MELLITUS TIENEN EL [place] PLAN DE [place]? Dado que todas las personas que padecen diabetes mellitus son distintas, no hay un solo plan de comidas que funcione para todos. Es muy importante que se rena con un nutricionista que lo ayudar a crear un plan de comidas adecuado para usted. Esta informacin no tiene Marine scientist el consejo del mdico. Asegrese de hacerle al mdico cualquier pregunta que tenga. Document Released: 12/13/2007 Document Revised: 09/26/2014 Document Reviewed: 08/02/2013 Elsevier Interactive Patient Education  2017 [place]. Diabetes mellitus y actividad fsica (Diabetes Mellitus and Exercise) Hacer actividad fsica habitualmente es importante para el estado de salud general, en especial si tiene diabetes (diabetes mellitus). La actividad fsica no solo se reduce a Pharmacist, hospital. Aporta muchos beneficios para la salud, como aumentar la fuerza muscular y la densidad sea, y reducir las grasas corporales y Dealer. Esto mejora el estado fsico, la flexibilidad y la resistencia, y todo ello redunda en un mejor estado de salud general. La actividad fsica tiene beneficios adicionales para los diabticos, entre ellos:  Disminuye el  apetito.  Ayuda a bajar y Consulting civil engineer glucemia bajo control.  Baja la presin arterial.  Ayuda a controlar las cantidades de sustancias grasas (lpidos) en la [place], como el colesterol y los triglicridos.  Mejora la respuesta del cuerpo a la insulina (optimizacin de la sensibilidad a la insulina).  Reduce la cantidad de insulina que el cuerpo necesita.  Reduce el riesgo de sufrir cardiopata coronaria de la siguiente forma: ? [employer] de colesterol y triglicridos. ? Aumenta los niveles de colesterol bueno. ? Disminuye la glucemia. QU ES EL PLAN DE ACTIVIDADES? El mdico o un educador para la diabetes certificado pueden ayudarlo a Engineer, petroleum del tipo y de la frecuencia de actividad fsica (plan de actividades) adecuado para usted. Asegrese de lo siguiente:  Haga por lo menos 157minutos semanales de ejercicios de intensidad moderada o vigorosa. Estos podran ser caminatas dinmicas, ciclismo o [place]. ? Haga ejercicios de elongacin y de fortalecimiento, como yoga o levantamiento de pesas, por lo menos 2veces por semana. ? Reparta la actividad en al menos 3das de la semana.  Haga algn tipo de actividad fsica [employer]. ? No deje pasar ms de 2das seguidos sin hacer algn tipo de actividad fsica. ? No permanezca inactivo durante ms de 32minutos seguidos. Tmese descansos frecuentes para caminar o estirarse.  Elija un tipo de ejercicio o de actividad que disfrute y establezca objetivos realistas.  Comience lentamente y aumente de [place] gradual la intensidad del ejercicio con el correr del [place]. QU DEBO SABER ACERCA DEL CONTROL DE LA DIABETES?  Contrlese la glucemia antes y despus de ejercitarse. ? Si la glucemia supera los 240mg /dl (  13,3mmol/l) antes de ejercitarse, hgase un control de la orina para detectar la presencia de cetonas. Si tiene cetonas en la orina, no se ejercite hasta que la glucemia se  normalice.  Conozca los sntomas de la glucemia baja (hipoglucemia) y aprenda cmo tratarla. El riesgo de tener hipoglucemia aumenta durante y despus de hacer actividad fsica. Los sntomas frecuentes de hipoglucemia pueden incluir los siguientes: ? Hambre. ? Ansiedad. ? Sudoracin y piel hmeda. ? Confusin. ? Mareos o sensacin de desvanecimiento. ? Aumento de la frecuencia cardaca o palpitaciones. ? Visin borrosa. ? Hormigueo o adormecimiento alrededor de la boca, los labios o la lengua. ? Temblores o sacudidas. ? Irritabilidad.  Tenga una colacin de carbohidratos de accin rpida disponible antes, durante y despus de ejercitarse, a fin de evitar o tratar la hipoglucemia.  Evite inyectarse insulina en las zonas del cuerpo que ejercitar. Por ejemplo, evite inyectarse insulina en: ? Los brazos, si juega al tenis. ? Las piernas, si corre.  Lleve registros de sus hbitos de actividad fsica. Esto puede ayudarlos a usted y al mdico a adaptar el plan de control de la diabetes segn sea necesario. Escriba los siguientes datos: ? Los alimentos que consume antes y despus de hacer actividad fsica. ? Los niveles de glucosa en la sangre antes y despus de hacer ejercicios. ? El tipo y cantidad de actividad fsica que realiza. ? Cuando se prev que la insulina alcance su valor mximo, si usa insulina. No haga actividad fsica en los momentos en que insulina alcanza su valor mximo.  Cuando comience un ejercicio o una actividad nuevos, trabaje con el mdico para asegurarse de que la actividad sea segura para usted y para ajustar la insulina, los medicamentos o la ingesta de alimentos segn sea necesario.  Beba gran cantidad de agua mientras hace ejercicios para evitar la deshidratacin o los golpes de calor. Beba suficiente lquido para mantener la orina clara o de color amarillo plido. Esta informacin no tiene como fin reemplazar el consejo del mdico. Asegrese de hacerle al mdico  cualquier pregunta que tenga. Document Released: 09/25/2007 Document Revised: 12/28/2015 Document Reviewed: 02/15/2016 Elsevier Interactive Patient Education  2018 Elsevier Inc.  

## 2017-03-01 NOTE — Progress Notes (Signed)
Patient is here for FU DM  Patient has taken medication today. Patient has eaten today.  Patient request a dentistry referral and opthalmology referral.  Patient complains of tooth ache causing a headache. Pain is scaled at a 10 currently.

## 2017-03-02 LAB — CBC WITH DIFFERENTIAL/PLATELET
BASOS: 1 %
Basophils Absolute: 0 10*3/uL (ref 0.0–0.2)
EOS (ABSOLUTE): 0.3 10*3/uL (ref 0.0–0.4)
EOS: 4 %
HEMATOCRIT: 41.4 % (ref 34.0–46.6)
HEMOGLOBIN: 13.1 g/dL (ref 11.1–15.9)
IMMATURE GRANS (ABS): 0 10*3/uL (ref 0.0–0.1)
IMMATURE GRANULOCYTES: 0 %
Lymphocytes Absolute: 1.8 10*3/uL (ref 0.7–3.1)
Lymphs: 27 %
MCH: 28.2 pg (ref 26.6–33.0)
MCHC: 31.6 g/dL (ref 31.5–35.7)
MCV: 89 fL (ref 79–97)
MONOS ABS: 0.5 10*3/uL (ref 0.1–0.9)
Monocytes: 8 %
NEUTROS PCT: 60 %
Neutrophils Absolute: 4.2 10*3/uL (ref 1.4–7.0)
Platelets: 372 10*3/uL (ref 150–379)
RBC: 4.65 x10E6/uL (ref 3.77–5.28)
RDW: 13.5 % (ref 12.3–15.4)
WBC: 6.9 10*3/uL (ref 3.4–10.8)

## 2017-03-02 LAB — CMP14+EGFR
A/G RATIO: 1.2 (ref 1.2–2.2)
ALBUMIN: 4 g/dL (ref 3.5–5.5)
ALT: 42 IU/L — ABNORMAL HIGH (ref 0–32)
AST: 48 IU/L — ABNORMAL HIGH (ref 0–40)
Alkaline Phosphatase: 149 IU/L — ABNORMAL HIGH (ref 39–117)
BUN / CREAT RATIO: 11 (ref 9–23)
BUN: 6 mg/dL (ref 6–24)
Bilirubin Total: 0.2 mg/dL (ref 0.0–1.2)
CALCIUM: 8.8 mg/dL (ref 8.7–10.2)
CO2: 22 mmol/L (ref 20–29)
Chloride: 99 mmol/L (ref 96–106)
Creatinine, Ser: 0.55 mg/dL — ABNORMAL LOW (ref 0.57–1.00)
GFR, EST AFRICAN AMERICAN: 135 mL/min/{1.73_m2} (ref >59)
GFR, EST NON AFRICAN AMERICAN: 117 mL/min/{1.73_m2} (ref >59)
GLOBULIN, TOTAL: 3.3 g/dL (ref 1.5–4.5)
Glucose: 203 mg/dL — ABNORMAL HIGH (ref 65–99)
POTASSIUM: 4.5 mmol/L (ref 3.5–5.2)
SODIUM: 137 mmol/L (ref 134–144)
TOTAL PROTEIN: 7.3 g/dL (ref 6.0–8.5)

## 2017-03-02 LAB — LIPID PANEL
CHOL/HDL RATIO: 5.4 ratio — AB (ref 0.0–4.4)
Cholesterol, Total: 167 mg/dL (ref 100–199)
HDL: 31 mg/dL — AB (ref >39)
LDL CALC: 95 mg/dL (ref 0–99)
Triglycerides: 207 mg/dL — ABNORMAL HIGH (ref 0–149)
VLDL CHOLESTEROL CAL: 41 mg/dL — AB (ref 5–40)

## 2017-03-02 LAB — VITAMIN D 25 HYDROXY (VIT D DEFICIENCY, FRACTURES): Vit D, 25-Hydroxy: 18.5 ng/mL — ABNORMAL LOW (ref 30.0–100.0)

## 2017-03-07 ENCOUNTER — Ambulatory Visit: Payer: Self-pay

## 2017-03-15 ENCOUNTER — Ambulatory Visit: Payer: Self-pay | Attending: Internal Medicine

## 2017-03-23 ENCOUNTER — Telehealth: Payer: Self-pay

## 2017-03-23 NOTE — Telephone Encounter (Signed)
CMA call regarding results  Patient verify DOB  Patient was aware and understood  

## 2017-03-23 NOTE — Telephone Encounter (Signed)
-----   Message from Margaretmary LombardNubia K Lisbon, [place] sent at 03/23/2017  3:56 PM EDT -----  Please inform patient of vitamin d level being low and cholesterol being high. Patient should begin vitamin d supplement. Patient should limit saturated fat intake.  And increase fiber and exercise. ----- Message ----- From: Quentin AngstJegede, Olugbemiga E, MD Sent: 03/04/2017   5:11 PM To: Margaretmary LombardNubia K Lisbon, CMA  Please inform patient that her vitamin D level is low, cholesterol is slightly high. Please take the vitamin D capsule as prescribed and please limit saturated fat to no more than 7% of your calories, limit cholesterol to 200 mg/day, increase fiber and exercise as tolerated. If needed we may add a cholesterol lowering medication to your regimen.

## 2017-03-30 ENCOUNTER — Emergency Department (HOSPITAL_COMMUNITY): Payer: Self-pay

## 2017-03-30 ENCOUNTER — Emergency Department (HOSPITAL_COMMUNITY)
Admission: EM | Admit: 2017-03-30 | Discharge: 2017-03-30 | Disposition: A | Discharge status: Home / Self Care | Payer: Self-pay | Attending: Emergency Medicine | Admitting: Emergency Medicine

## 2017-03-30 ENCOUNTER — Encounter (HOSPITAL_COMMUNITY): Payer: Self-pay

## 2017-03-30 DIAGNOSIS — E119 Type 2 diabetes mellitus without complications: Secondary | ICD-10-CM | POA: Insufficient documentation

## 2017-03-30 DIAGNOSIS — Z23 Encounter for immunization: Secondary | ICD-10-CM | POA: Insufficient documentation

## 2017-03-30 DIAGNOSIS — J45909 Unspecified asthma, uncomplicated: Secondary | ICD-10-CM | POA: Insufficient documentation

## 2017-03-30 DIAGNOSIS — Y939 Activity, unspecified: Secondary | ICD-10-CM | POA: Insufficient documentation

## 2017-03-30 DIAGNOSIS — Y999 Unspecified external cause status: Secondary | ICD-10-CM | POA: Insufficient documentation

## 2017-03-30 DIAGNOSIS — Z7984 Long term (current) use of oral hypoglycemic drugs: Secondary | ICD-10-CM | POA: Insufficient documentation

## 2017-03-30 DIAGNOSIS — S91311A Laceration without foreign body, right foot, initial encounter: Secondary | ICD-10-CM | POA: Insufficient documentation

## 2017-03-30 DIAGNOSIS — Y929 Unspecified place or not applicable: Secondary | ICD-10-CM | POA: Insufficient documentation

## 2017-03-30 DIAGNOSIS — W268XXA Contact with other sharp object(s), not elsewhere classified, initial encounter: Secondary | ICD-10-CM | POA: Insufficient documentation

## 2017-03-30 LAB — CBG MONITORING, ED: GLUCOSE-CAPILLARY: 181 mg/dL — AB (ref 65–99)

## 2017-03-30 MED ORDER — TETANUS-DIPHTH-ACELL PERTUSSIS 5-2.5-18.5 LF-MCG/0.5 IM SUSP
0.5000 mL | Freq: Once | INTRAMUSCULAR | Status: AC
  Administered 2017-03-30: 0.5 mL via INTRAMUSCULAR
  Filled 2017-03-30: qty 0.5

## 2017-03-30 MED ORDER — SULFAMETHOXAZOLE-TRIMETHOPRIM 800-160 MG PO TABS
1.0000 | ORAL_TABLET | Freq: Once | ORAL | Status: AC
  Administered 2017-03-30: 1 via ORAL
  Filled 2017-03-30: qty 1

## 2017-03-30 MED ORDER — SULFAMETHOXAZOLE-TRIMETHOPRIM 800-160 MG PO TABS: 1.0000 | ORAL_TABLET | Freq: Two times a day (BID) | ORAL | 0 refills | Status: AC

## 2017-03-30 MED ORDER — CEPHALEXIN 250 MG PO CAPS
500.0000 mg | ORAL_CAPSULE | Freq: Once | ORAL | Status: AC
  Administered 2017-03-30: 500 mg via ORAL
  Filled 2017-03-30: qty 2

## 2017-03-30 MED ORDER — IBUPROFEN 400 MG PO TABS
600.0000 mg | ORAL_TABLET | Freq: Once | ORAL | Status: AC
  Administered 2017-03-30: 600 mg via ORAL
  Filled 2017-03-30: qty 1

## 2017-03-30 MED ORDER — CEPHALEXIN 500 MG PO CAPS: 500.0000 mg | ORAL_CAPSULE | Freq: Two times a day (BID) | ORAL | 0 refills | Status: DC

## 2017-03-30 MED ORDER — IBUPROFEN 600 MG PO TABS: 600.0000 mg | ORAL_TABLET | Freq: Four times a day (QID) | ORAL | 0 refills | Status: DC | PRN

## 2017-03-30 NOTE — ED Triage Notes (Signed)
Pt has laceration to right great toe after stepping on dog food can lid. Pt has DM so she wanted to have it looked at and get antibiotic.

## 2017-03-30 NOTE — Discharge Instructions (Signed)
Keep wound clean using Dial antibacterial soap and water. I recommend changing her dressing once daily. Take atraumatic as prescribed until completed. Take your ibuprofen as needed for pain relief, he may also apply ice to affected area for 15-20 minutes 3-4 times daily for additional relief. Follow-up with your primary care provider on Monday for wound recheck. Return to the emergency department if symptoms worsen or new onset of fever, redness, swelling, warmth, drainage, numbness, weakness, decreased range of motion.

## 2017-03-30 NOTE — ED Provider Notes (Signed)
[facility] DEPT Provider Note   CSN: [id number] Arrival date & time: 03/30/17  1754  By signing my name below, I, Theresia Bough, attest that this documentation has been prepared under the direction and in the presence of non-physician practitioner, Janeece Agee., PA-C. Electronically Signed: Theresia Bough, ED Scribe. 03/30/17. 8:19 PM.  History   Chief Complaint Chief Complaint  Patient presents with  . Foot Injury   The history is provided by the patient. No language interpreter was used.   HPI Comments: Alexandra Aguirre is a 41 y.o. female with a PMHx of DM, who presents to the Emergency Department complaining of right foot pain onset last night around 7pm. Pt states she stepped on a metal can and has a laceration to the bottom of her right toe. Tetanus status is unknown. Endorses associated pain to toe. Pt has tried naproxen and has cleaned the wound with rubbing alcohol and water PTA.  Pt denies fever or any other complaints at this time. Denies bleeding, redness, swelling, warmth, drainage.  Past Medical History:  Diagnosis Date  . Abnormal Pap smear of cervix 09/24/2013   AGUS  . Asthma   . Diabetes mellitus without complication ([place])   . GERD (gastroesophageal reflux disease)     Patient Active Problem List   Diagnosis Date Noted  . Vitamin D deficiency 03/01/2017  . Muscle strain of right upper arm 03/01/2017  . Type 2 diabetes mellitus with complication, without long-term current use of insulin ([place]) 10/12/2016  . Gastroesophageal reflux disease 10/12/2016  . Generalized anxiety disorder 10/12/2016  . IFG (impaired fasting glucose) 02/18/2014  . UTI (urinary tract infection) 01/30/2014  . Vaginal bleeding between periods 01/30/2014  . Dysmenorrhea 01/30/2014  . Menorrhagia 01/30/2014  . Asthma with acute exacerbation 01/15/2014  . Vertigo 11/25/2013  . History of cervical dysplasia 11/01/2013  . Atypical glandular cells of undetermined significance (AGUS) on  cervical Pap smear 10/08/2013  . Breast pain, right 10/08/2013  . Pedal edema 09/30/2013  . Migraine headache 09/30/2013  . Unspecified gastritis and gastroduodenitis without mention of hemorrhage 09/30/2013  . Back pain 09/30/2013    Past Surgical History:  Procedure Laterality Date  . CERVICAL BIOPSY  W/ LOOP ELECTRODE EXCISION    . removal of cyst Right 2011    OB History    Gravida Para Term Preterm AB Living   5 5 5     5    SAB TAB Ectopic Multiple Live Births                   Home Medications    Prior to Admission medications   Medication Sig Start Date End Date Taking? Authorizing Provider  albuterol (PROVENTIL HFA;VENTOLIN HFA) 108 (90 BASE) MCG/ACT inhaler Inhale 2 puffs into the lungs every 6 (six) hours as needed for wheezing or shortness of breath. 01/14/14   Lind Covert, MD  amitriptyline (ELAVIL) 50 MG tablet Take 1 tablet (50 mg total) by mouth at bedtime. 03/01/17   Tresa Garter, MD  aspirin-acetaminophen-caffeine (EXCEDRIN MIGRAINE) [phone] MG tablet Take 1 tablet by mouth as needed for headache or migraine. 10/12/16   Tresa Garter, MD  Blood Glucose Monitoring Suppl (TRUE METRIX METER) w/Device KIT Check blood sugar fasting in the morning and at bedtime 09/30/16   Argentina Donovan, PA-C  busPIRone (BUSPAR) 10 MG tablet Take 1 tablet (10 mg total) by mouth 2 (two) times daily. 03/01/17   Tresa Garter, MD  cephALEXin (KEFLEX) 500 MG  capsule Take 1 capsule (500 mg total) by mouth 2 (two) times daily. 03/30/17   Nona Dell, PA-C  cetirizine (ZYRTEC) 10 MG tablet Take 1 tablet (10 mg total) by mouth daily. 02/13/15   Lorayne Marek, MD  glucose blood (TRUE METRIX BLOOD GLUCOSE TEST) test strip Use as instructed 09/30/16   Argentina Donovan, PA-C  ibuprofen (ADVIL,MOTRIN) 600 MG tablet Take 1 tablet (600 mg total) by mouth every 6 (six) hours as needed. 03/30/17   Nona Dell, PA-C  metFORMIN (GLUCOPHAGE) 1000 MG  tablet Take 1 tablet (1,000 mg total) by mouth 2 (two) times daily with a meal. 03/01/17   Jegede, Marlena Clipper, MD  methocarbamol (ROBAXIN) 500 MG tablet Take 1 tablet (500 mg total) by mouth 4 (four) times daily. X 1 week then prn muscle pain 03/01/17   Tresa Garter, MD  naproxen (NAPROSYN) 500 MG tablet Take 1 tablet (500 mg total) by mouth 2 (two) times daily with a meal. 03/01/17   Jegede, Marlena Clipper, MD  omeprazole (PRILOSEC) 20 MG capsule Take 1 capsule (20 mg total) by mouth daily. 03/01/17   Tresa Garter, MD  ranitidine (ZANTAC) 150 MG capsule Take 1 capsule (150 mg total) by mouth daily. 03/01/17   Tresa Garter, MD  sulfamethoxazole-trimethoprim (BACTRIM DS,SEPTRA DS) 800-160 MG tablet Take 1 tablet by mouth 2 (two) times daily. 03/30/17 04/06/17  Nona Dell, PA-C  TRUEPLUS LANCETS 28G MISC Check blood sugar fasting and at bedtime 09/30/16   Argentina Donovan, PA-C  Vitamin D, Ergocalciferol, (DRISDOL) 50000 units CAPS capsule Take 1 capsule (50,000 Units total) by mouth every 7 (seven) days. 03/01/17   Tresa Garter, MD    Family History Family History  Problem Relation Age of Onset  . Cancer Mother        cervical/ovarian  . Diabetes Mother   . Diabetes Maternal Uncle   . Diabetes Brother   . Diabetes Maternal Uncle   . Diabetes Maternal Uncle     Social History Social History  Substance Use Topics  . Smoking status: Never Smoker  . Smokeless tobacco: Never Used  . Alcohol use No     Allergies   Fruit & vegetable daily [nutritional supplements]   Review of Systems Review of Systems  Constitutional: Negative for fever.  Musculoskeletal: Positive for myalgias.  Skin: Positive for wound.  Neurological: Positive for headaches.     Physical Exam Updated Vital Signs BP 111/67   Pulse 76   Resp 16   LMP 03/01/2017   SpO2 98%   Physical Exam  Constitutional: She is oriented to person, place, and time. She appears  well-developed and well-nourished.  HENT:  Head: Normocephalic and atraumatic.  Eyes: Conjunctivae and EOM are normal. Right eye exhibits no discharge. Left eye exhibits no discharge. No scleral icterus.  Neck: Normal range of motion. Neck supple.  Cardiovascular: Normal rate and intact distal pulses.   Pulmonary/Chest: Effort normal.  Musculoskeletal: Normal range of motion. She exhibits no edema or tenderness.  Full ROM of left toes, foot, ankle and knee with 5/5 strength. Sensation grossly intact. 2+ DP pulse.   Neurological: She is alert and oriented to person, place, and time.  Skin: Skin is warm and dry. Capillary refill takes less than 2 seconds.  2 cm laceration to the plantar aspect of the right 1st distal phalanx of great toe. No active bleeding or railage. No surrounding swelling with erythema or warmth.   Nursing note  and vitals reviewed.    ED Treatments / Results  DIAGNOSTIC STUDIES: Oxygen Saturation is 98% on RA, normal by my interpretation.   COORDINATION OF CARE: 7:59 PM-Discussed next steps with pt including XR. Pt verbalized understanding and is agreeable with the plan.   Labs (all labs ordered are listed, but only abnormal results are displayed) Labs Reviewed  CBG MONITORING, ED - Abnormal; Notable for the following:       Result Value   Glucose-Capillary 181 (*)    All other components within normal limits    EKG  EKG Interpretation None       Radiology Dg Foot Complete Right  Result Date: 03/30/2017 CLINICAL DATA:  Laceration to the great toe EXAM: RIGHT FOOT COMPLETE - 3+ VIEW COMPARISON:  10/11/2005 FINDINGS: There is no evidence of fracture or dislocation. Similar appearance of lateral lucency at the first distal phalanx compared to prior radiograph. There is no evidence of arthropathy or other focal bone abnormality. Soft tissues are unremarkable. Small plantar calcaneal spur. IMPRESSION: No definite acute osseous abnormality. Electronically  Signed   By: Donavan Foil M.D.   On: 03/30/2017 20:24    Procedures Procedures (including critical care time)  Medications Ordered in ED Medications  ibuprofen (ADVIL,MOTRIN) tablet 600 mg (not administered)  cephALEXin (KEFLEX) capsule 500 mg (not administered)  sulfamethoxazole-trimethoprim (BACTRIM DS,SEPTRA DS) 800-160 MG per tablet 1 tablet (not administered)  Tdap (BOOSTRIX) injection 0.5 mL (0.5 mLs Intramuscular Given 03/30/17 2033)     Initial Impression / Assessment and Plan / ED Course  I have reviewed the triage vital signs and the nursing notes.  Pertinent labs & imaging results that were available during my care of the patient were reviewed by me and considered in my medical decision making (see chart for details).     Patient presents with a laceration that occurred last night at 7 PM. Patient reports cutting her foot on a can of dog food. Bleeding controlled. Denies fever, redness, swelling, purulent drainage. Patient reports history of diabetes. VSS. Exam revealed 2 cm superficial laceration to plantar aspect of right great toe, no evidence of surrounding cellulitis, no active bleeding or drainage. Right foot neurovascularly intact. Right foot x-ray negative. Wound irrigated in the ED. Due to laceration occurring over 24 hours ago, plan to irrigate wound, apply dressing and start patient on antibiotics without wound closure. Discussed results and plan with patient using a phone interpreter. Advised patient to follow up with her PCP in 3-4 days for wound recheck. Discussed return precautions. Patient reports understanding and agreement with plan.  Final Clinical Impressions(s) / ED Diagnoses   Final diagnoses:  Laceration of right foot, initial encounter    New Prescriptions New Prescriptions   CEPHALEXIN (KEFLEX) 500 MG CAPSULE    Take 1 capsule (500 mg total) by mouth 2 (two) times daily.   IBUPROFEN (ADVIL,MOTRIN) 600 MG TABLET    Take 1 tablet (600 mg total) by  mouth every 6 (six) hours as needed.   SULFAMETHOXAZOLE-TRIMETHOPRIM (BACTRIM DS,SEPTRA DS) 800-160 MG TABLET    Take 1 tablet by mouth 2 (two) times daily.   I personally performed the services described in this documentation, which was scribed in my presence. The recorded information has been reviewed and is accurate.     Nona Dell, PA-C 03/30/17 2057    Orlie Dakin, MD 03/30/17 [id number]

## 2017-03-30 NOTE — ED Notes (Signed)
Pt verbalized understanding discharge instructions and denies any further needs or questions at this time. VS stable, ambulatory and steady gait.   

## 2017-05-11 ENCOUNTER — Ambulatory Visit: Payer: Self-pay

## 2017-06-01 ENCOUNTER — Encounter: Payer: Self-pay | Admitting: Physician Assistant

## 2017-06-01 ENCOUNTER — Ambulatory Visit: Payer: Medicaid Other | Attending: Internal Medicine | Admitting: Physician Assistant

## 2017-06-01 VITALS — BP 96/65 | HR 91 | Temp 98.1°F | Resp 18 | Ht 62.0 in | Wt 195.0 lb

## 2017-06-01 DIAGNOSIS — E118 Type 2 diabetes mellitus with unspecified complications: Secondary | ICD-10-CM

## 2017-06-01 DIAGNOSIS — R6884 Jaw pain: Secondary | ICD-10-CM | POA: Insufficient documentation

## 2017-06-01 DIAGNOSIS — Z7984 Long term (current) use of oral hypoglycemic drugs: Secondary | ICD-10-CM | POA: Insufficient documentation

## 2017-06-01 DIAGNOSIS — E119 Type 2 diabetes mellitus without complications: Secondary | ICD-10-CM | POA: Insufficient documentation

## 2017-06-01 DIAGNOSIS — K089 Disorder of teeth and supporting structures, unspecified: Secondary | ICD-10-CM | POA: Diagnosis not present

## 2017-06-01 DIAGNOSIS — J45909 Unspecified asthma, uncomplicated: Secondary | ICD-10-CM | POA: Insufficient documentation

## 2017-06-01 DIAGNOSIS — Z91018 Allergy to other foods: Secondary | ICD-10-CM | POA: Insufficient documentation

## 2017-06-01 DIAGNOSIS — H81399 Other peripheral vertigo, unspecified ear: Secondary | ICD-10-CM | POA: Diagnosis not present

## 2017-06-01 DIAGNOSIS — K219 Gastro-esophageal reflux disease without esophagitis: Secondary | ICD-10-CM | POA: Insufficient documentation

## 2017-06-01 LAB — GLUCOSE, POCT (MANUAL RESULT ENTRY): POC Glucose: 241 mg/dl — AB (ref 70–99)

## 2017-06-01 MED ORDER — FLUTICASONE PROPIONATE 50 MCG/ACT NA SUSP: 2.0000 | Freq: Every day | NASAL | 6 refills | Status: DC

## 2017-06-01 MED ORDER — MECLIZINE HCL 25 MG PO TABS: 25.0000 mg | ORAL_TABLET | Freq: Three times a day (TID) | ORAL | 0 refills | Status: DC | PRN

## 2017-06-01 MED ORDER — NAPROXEN 500 MG PO TABS: 500.0000 mg | ORAL_TABLET | Freq: Two times a day (BID) | ORAL | 0 refills | Status: DC

## 2017-06-01 NOTE — Progress Notes (Signed)
Patient ID: Alexandra Aguirre, female   DOB: Oct 03, 1975, 41 y.o.   MRN: [id number]     Alexandra Aguirre, is a 41 y.o. female  DGU:[id number]  [id number]:[id number]  DOB - 1976-05-14  Subjective:  Chief Complaint and HPI: Alexandra Aguirre is a 41 y.o. female here today for several concerns.  3 weeks of dizziness and "feels like my house is spinning." About 3 weeks ago had a fall in her bathroom after taking a few steps out of her bed and into the bathroom at 3 am.  She didn't check her blood sugar at the time.  Her husband came to her side and helped her back to bed. It sounds as though she did lose consciousness/wasn't aware of what was going on at the time.  Since then, she has been having episodes of dizziness.  She usu checks her blood sugars about every other day and usu numbers bt 100-200. No other episodes of passing out. No vision changes.  She has had episodes of dizziness in the past.  No other neurological s/sx.  Legrand Como with [employer] interpreters translating.    Needs referral to dentistry.    Pain in B jaw.  +some sinus and ear congestion.    Wants to discuss cholesterol.     ROS:   Constitutional:  No f/c, No night sweats, No unexplained weight loss. EENT:  No vision changes, No blurry vision, No hearing changes. No mouth, throat, or ear problems other than some cngestion Respiratory: No cough, No SOB Cardiac: No CP, no palpitations GI:  No abd pain, No N/V/D. GU: No Urinary s/sx Musculoskeletal: No joint pain Neuro: No headache, +dizziness, no motor weakness.  Skin: No rash Endocrine:  No polydipsia. No polyuria.  Psych: Denies SI/HI  No problems updated.  ALLERGIES: Allergies  Allergen Reactions  . Fruit & Vegetable Daily [Nutritional Supplements] Other (See Comments)    "mouth rash" from Avocado, apple, peaches, plums, kiwi    PAST MEDICAL HISTORY: Past Medical History:  Diagnosis Date  . Abnormal Pap smear of cervix 09/24/2013   AGUS  . Asthma   . Diabetes mellitus  without complication ([place])   . GERD (gastroesophageal reflux disease)     MEDICATIONS AT HOME: Prior to Admission medications   Medication Sig Start Date End Date Taking? Authorizing Provider  albuterol (PROVENTIL HFA;VENTOLIN HFA) 108 (90 BASE) MCG/ACT inhaler Inhale 2 puffs into the lungs every 6 (six) hours as needed for wheezing or shortness of breath. 01/14/14  Yes Chambliss, Jeb Levering, MD  amitriptyline (ELAVIL) 50 MG tablet Take 1 tablet (50 mg total) by mouth at bedtime. 03/01/17  Yes Tresa Garter, MD  aspirin-acetaminophen-caffeine (EXCEDRIN MIGRAINE) [phone] MG tablet Take 1 tablet by mouth as needed for headache or migraine. 10/12/16  Yes Tresa Garter, MD  Blood Glucose Monitoring Suppl (TRUE METRIX METER) w/Device KIT Check blood sugar fasting in the morning and at bedtime 09/30/16  Yes McClung, Angela M, PA-C  busPIRone (BUSPAR) 10 MG tablet Take 1 tablet (10 mg total) by mouth 2 (two) times daily. 03/01/17  Yes Tresa Garter, MD  cetirizine (ZYRTEC) 10 MG tablet Take 1 tablet (10 mg total) by mouth daily. 02/13/15  Yes Advani, Vernon Prey, MD  glucose blood (TRUE METRIX BLOOD GLUCOSE TEST) test strip Use as instructed 09/30/16  Yes McClung, Angela M, PA-C  metFORMIN (GLUCOPHAGE) 1000 MG tablet Take 1 tablet (1,000 mg total) by mouth 2 (two) times daily with a meal. 03/01/17  Yes Tresa Garter, MD  methocarbamol (ROBAXIN) 500 MG tablet Take 1 tablet (500 mg total) by mouth 4 (four) times daily. X 1 week then prn muscle pain 03/01/17  Yes Jegede, Olugbemiga E, MD  naproxen (NAPROSYN) 500 MG tablet Take 1 tablet (500 mg total) by mouth 2 (two) times daily with a meal. X 7 days then prn pain 06/01/17  Yes McClung, Angela M, PA-C  omeprazole (PRILOSEC) 20 MG capsule Take 1 capsule (20 mg total) by mouth daily. 03/01/17  Yes Tresa Garter, MD  ranitidine (ZANTAC) 150 MG capsule Take 1 capsule (150 mg total) by mouth daily. 03/01/17  Yes Tresa Garter, MD    TRUEPLUS LANCETS 28G MISC Check blood sugar fasting and at bedtime 09/30/16  Yes McClung, Angela M, PA-C  Vitamin D, Ergocalciferol, (DRISDOL) 50000 units CAPS capsule Take 1 capsule (50,000 Units total) by mouth every 7 (seven) days. 03/01/17  Yes Tresa Garter, MD  fluticasone (FLONASE) 50 MCG/ACT nasal spray Place 2 sprays into both nostrils daily. 06/01/17   Argentina Donovan, PA-C  meclizine (ANTIVERT) 25 MG tablet Take 1 tablet (25 mg total) by mouth 3 (three) times daily as needed for dizziness. 06/01/17   Argentina Donovan, PA-C     Objective:  EXAM:   Vitals:   06/01/17 1011  BP: 96/65  Pulse: 91  Resp: 18  Temp: 98.1 F (36.7 C)  TempSrc: Oral  SpO2: 100%  Weight: 195 lb (88.5 kg)  Height: 5' 2"  (1.575 m)    General appearance : A&OX3. NAD. Non-toxic-appearing HEENT: Atraumatic and Normocephalic.  PERRLA. EOM intact.  TM full B. Mouth-MMM, post pharynx WNL w/o erythema, No PND.  Turbinates pale and boggy and enlarged.  B TTP over TMJ Neck: supple, no JVD. No cervical lymphadenopathy. No thyromegaly Chest/Lungs:  Breathing-non-labored, Good air entry bilaterally, breath sounds normal without rales, rhonchi, or wheezing  CVS: S1 S2 regular, no murmurs, gallops, rubs  Extremities: Bilateral Lower Ext shows no edema, both legs are warm to touch with = pulse throughout Neurology:  CN II-XII grossly intact, Non focal.  Finger to nose; hell to shin WNL.  No babinski.  Gait unassisted is WNL Psych:  TP linear. J/I WNL. Normal speech. Appropriate eye contact and affect.  Skin:  No Rash  Data Review Lab Results  Component Value Date   HGBA1C 7.7 03/01/2017   HGBA1C 8.1 09/30/2016   HGBA1C 6.3 (H) 02/18/2014     Assessment & Plan   1. Other peripheral vertigo, unspecified ear No red flags today.   - CT Head Wo Contrast; Future due to LOC and unknown if head contusion with contniued dizziness - fluticasone (FLONASE) 50 MCG/ACT nasal spray; Place 2 sprays into both  nostrils daily.  Dispense: 16 g; Refill: 6 - meclizine (ANTIVERT) 25 MG tablet; Take 1 tablet (25 mg total) by mouth 3 (three) times daily as needed for dizziness.  Dispense: 30 tablet; Refill: 0  2. Type 2 diabetes mellitus with complication, without long-term current use of insulin (HCC) Uncontrolled.  Discussed diabetic diet.  Continue current regimen for now as she sees her PCP next week.  - Glucose (CBG)  3. Poor dentition - Ambulatory referral to Dentistry  4. Jaw pain - naproxen (NAPROSYN) 500 MG tablet; Take 1 tablet (500 mg total) by mouth 2 (two) times daily with a meal. X 7 days then prn pain  Dispense: 60 tablet; Refill: 0   Patient have been counseled extensively about nutrition and exercise  Return for keep appt  net week with Dr Doreene Burke.  The patient was given clear instructions to go to ER or return to medical center if symptoms don't improve, worsen or new problems develop. The patient verbalized understanding. The patient was told to call to get lab results if they haven't heard anything in the next week.     Freeman Caldron, PA-C [facility] and [facility] [place], [place]   06/01/2017, 10:31 AM

## 2017-06-01 NOTE — Patient Instructions (Signed)
Mareos (Dizziness) Los mareos son un problema muy frecuente. Se trata de una sensacin de inestabilidad o de desvanecimiento. Puede sentir que se va a desmayar. Un mareo puede provocarle una lesin si se tropieza o se cae. Las personas de todas las edades pueden sufrir mareos, pero es ms frecuente en los adultos mayores. Esta afeccin puede tener muchas causas, entre las que se pueden mencionar los medicamentos, la deshidratacin y las enfermedades. INSTRUCCIONES PARA EL CUIDADO EN EL HOGAR Estas indicaciones pueden ayudarlo con el trastorno: Comida y bebida  Beba suficiente lquido para mantener la orina clara o de color amarillo plido. Esto evita la deshidratacin. Trate de beber ms lquidos transparentes, como agua.  No beba alcohol.  Limite el consumo de cafena si el mdico se lo indica.  Limite el consumo de sal si el mdico se lo indica. Actividad  Evite los movimientos rpidos. ? Levntese de las sillas con lentitud y apyese hasta sentirse bien. ? Por la maana, sintese primero a un lado de la cama. Cuando se sienta bien, pngase lentamente de pie mientras se sostiene de algo, hasta que sepa que ha logrado el equilibrio.  Mueva las piernas con frecuencia si debe estar de pie en un lugar durante mucho tiempo. Mientras est de pie, contraiga y relaje los msculos de las piernas.  No conduzca vehculos ni opere maquinaria pesada si se siente mareado.  Evite agacharse si se siente mareado. En su casa, coloque los objetos de modo que le resulte fcil alcanzarlos sin agacharse. Estilo de vida  No consuma ningn producto que contenga tabaco, lo que incluye cigarrillos, tabaco de mascar o cigarrillos electrnicos. Si necesita ayuda para dejar de fumar, consulte al mdico.  Trate de reducir el nivel de estrs practicando actividades como el yoga o la meditacin. Hable con el mdico si necesita ayuda. Instrucciones generales  Controle sus mareos para ver si hay cambios.  Tome los  medicamentos solamente como se lo haya indicado el mdico. Hable con el mdico si cree que los medicamentos que est tomando son la causa de sus mareos.  Infrmele a un amigo o a un familiar si se siente mareado. Pdale a esta persona que llame al mdico si observa cambios en su comportamiento.  Concurra a todas las visitas de control como se lo haya indicado el mdico. Esto es importante. SOLICITE ATENCIN MDICA SI:  Los mareos persisten.  Los mareos o la sensacin de desvanecimiento empeoran.  Siente nuseas.  Ha perdido la audicin.  Aparecen nuevos sntomas.  Cuando est de pie se siente inestable o que la habitacin da vueltas.  SOLICITE ATENCIN MDICA DE INMEDIATO SI:  Vomita o tiene diarrea y no puede comer ni beber nada.  Tiene dificultad para hablar, para caminar, para tragar o para usar los brazos, las manos o las piernas.  Siente una debilidad generalizada.  No piensa con claridad o tiene dificultades para armar oraciones. Es posible que un amigo o un familiar adviertan que esto ocurre.  Tiene dolor de pecho, dolor abdominal, sudoracin o le falta el aire.  Hay cambios en la visin.  Observa un sangrado.  Tiene dolores de cabeza.  Tiene dolor o rigidez en el cuello.  Tiene fiebre.  Esta informacin no tiene como fin reemplazar el consejo del mdico. Asegrese de hacerle al mdico cualquier pregunta que tenga. Document Released: 09/05/2005 Document Revised: 01/20/2015 Document Reviewed: 09/01/2014 Elsevier Interactive Patient Education  2017 [employer]vier Inc.  

## 2017-06-02 ENCOUNTER — Ambulatory Visit (HOSPITAL_COMMUNITY): Admission: RE | Admit: 2017-06-02 | Payer: Self-pay | Source: Ambulatory Visit

## 2017-06-05 ENCOUNTER — Ambulatory Visit: Payer: Self-pay | Admitting: Internal Medicine

## 2017-06-07 ENCOUNTER — Ambulatory Visit (HOSPITAL_COMMUNITY)
Admission: RE | Admit: 2017-06-07 | Discharge: 2017-06-07 | Disposition: A | Discharge status: Home / Self Care | Payer: Self-pay | Source: Ambulatory Visit | Attending: Physician Assistant | Admitting: Physician Assistant

## 2017-06-07 ENCOUNTER — Ambulatory Visit: Payer: Self-pay | Admitting: Internal Medicine

## 2017-06-07 ENCOUNTER — Encounter (HOSPITAL_COMMUNITY): Payer: Self-pay

## 2017-06-07 DIAGNOSIS — H81399 Other peripheral vertigo, unspecified ear: Secondary | ICD-10-CM | POA: Insufficient documentation

## 2017-06-09 ENCOUNTER — Telehealth: Payer: Self-pay

## 2017-06-09 NOTE — Telephone Encounter (Signed)
-----   Message from Nubia K Lisbon, CMA sent at 06/09/2017 10:40 AM EDT ----- Please inform patient of CT being normal. Patient may follow up as planned 

## 2017-06-09 NOTE — Progress Notes (Signed)
Please inform patient of CT being normal. Patient may follow up as planned

## 2017-06-09 NOTE — Telephone Encounter (Signed)
CMA call regarding lab results   Patient Verify DOB   Patient was aware and understood  

## 2017-06-09 NOTE — Telephone Encounter (Signed)
-----   Message from Margaretmary Lombard, [place] sent at 06/09/2017 10:40 AM EDT ----- Please inform patient of CT being normal. Patient may follow up as planned

## 2017-06-09 NOTE — Telephone Encounter (Signed)
CMA call regarding la results   Automated system said my call can not be completed at this time to call  later

## 2017-06-21 ENCOUNTER — Ambulatory Visit: Payer: Self-pay | Attending: Internal Medicine | Admitting: Internal Medicine

## 2017-06-21 ENCOUNTER — Encounter: Payer: Self-pay | Admitting: Internal Medicine

## 2017-06-21 VITALS — BP 111/75 | HR 71 | Temp 98.2°F | Resp 18 | Ht 62.0 in | Wt 195.4 lb

## 2017-06-21 DIAGNOSIS — E559 Vitamin D deficiency, unspecified: Secondary | ICD-10-CM | POA: Insufficient documentation

## 2017-06-21 DIAGNOSIS — K219 Gastro-esophageal reflux disease without esophagitis: Secondary | ICD-10-CM | POA: Insufficient documentation

## 2017-06-21 DIAGNOSIS — F411 Generalized anxiety disorder: Secondary | ICD-10-CM | POA: Insufficient documentation

## 2017-06-21 DIAGNOSIS — E118 Type 2 diabetes mellitus with unspecified complications: Secondary | ICD-10-CM | POA: Insufficient documentation

## 2017-06-21 DIAGNOSIS — S46911A Strain of unspecified muscle, fascia and tendon at shoulder and upper arm level, right arm, initial encounter: Secondary | ICD-10-CM | POA: Insufficient documentation

## 2017-06-21 DIAGNOSIS — N921 Excessive and frequent menstruation with irregular cycle: Secondary | ICD-10-CM

## 2017-06-21 DIAGNOSIS — Z7982 Long term (current) use of aspirin: Secondary | ICD-10-CM | POA: Insufficient documentation

## 2017-06-21 DIAGNOSIS — J45909 Unspecified asthma, uncomplicated: Secondary | ICD-10-CM | POA: Insufficient documentation

## 2017-06-21 DIAGNOSIS — S46911D Strain of unspecified muscle, fascia and tendon at shoulder and upper arm level, right arm, subsequent encounter: Secondary | ICD-10-CM

## 2017-06-21 DIAGNOSIS — Z79899 Other long term (current) drug therapy: Secondary | ICD-10-CM | POA: Insufficient documentation

## 2017-06-21 DIAGNOSIS — X58XXXA Exposure to other specified factors, initial encounter: Secondary | ICD-10-CM | POA: Insufficient documentation

## 2017-06-21 DIAGNOSIS — N920 Excessive and frequent menstruation with regular cycle: Secondary | ICD-10-CM | POA: Insufficient documentation

## 2017-06-21 DIAGNOSIS — K029 Dental caries, unspecified: Secondary | ICD-10-CM | POA: Insufficient documentation

## 2017-06-21 DIAGNOSIS — Z7984 Long term (current) use of oral hypoglycemic drugs: Secondary | ICD-10-CM | POA: Insufficient documentation

## 2017-06-21 LAB — POCT GLYCOSYLATED HEMOGLOBIN (HGB A1C): Hemoglobin A1C: 7.8

## 2017-06-21 LAB — GLUCOSE, POCT (MANUAL RESULT ENTRY): POC Glucose: 183 mg/dl — AB (ref 70–99)

## 2017-06-21 MED ORDER — VITAMIN D (ERGOCALCIFEROL) 1.25 MG (50000 UNIT) PO CAPS: 50000.0000 [IU] | ORAL_CAPSULE | ORAL | 0 refills | Status: DC

## 2017-06-21 MED ORDER — AMITRIPTYLINE HCL 50 MG PO TABS: 50.0000 mg | ORAL_TABLET | Freq: Every day | ORAL | 3 refills | Status: DC

## 2017-06-21 MED ORDER — BUSPIRONE HCL 10 MG PO TABS: 10.0000 mg | ORAL_TABLET | Freq: Two times a day (BID) | ORAL | 3 refills | Status: DC

## 2017-06-21 MED ORDER — RANITIDINE HCL 150 MG PO CAPS: 150.0000 mg | ORAL_CAPSULE | Freq: Every day | ORAL | 3 refills | Status: DC

## 2017-06-21 MED ORDER — METHOCARBAMOL 500 MG PO TABS: 500.0000 mg | ORAL_TABLET | Freq: Four times a day (QID) | ORAL | 0 refills | Status: DC

## 2017-06-21 MED ORDER — OMEPRAZOLE 20 MG PO CPDR: 20.0000 mg | DELAYED_RELEASE_CAPSULE | Freq: Every day | ORAL | 3 refills | Status: DC

## 2017-06-21 MED ORDER — METFORMIN HCL 1000 MG PO TABS
1000.0000 mg | ORAL_TABLET | Freq: Two times a day (BID) | ORAL | 3 refills | Status: DC
Start: 2017-06-21 — End: 2017-10-11

## 2017-06-21 NOTE — Patient Instructions (Signed)
Control del nivel sanguíneo de glucosa en los adultos °(Blood Glucose Monitoring, Adult) °El control del nivel de azúcar (glucosa) en la sangre lo ayuda a tener la diabetes bajo control. También ayuda a que usted y el médico controlen si el tratamiento de la diabetes es eficaz. El control del nivel sanguíneo de glucosa implica realizar controles regulares como lo indique el médico y llevar registro de los resultados (registro diario). °¿POR QUÉ DEBO CONTROLAR EL NIVEL SANGUÍNEO DE GLUCOSA? °Si controla su nivel sanguíneo de glucosa con regularidad, podrá: °· Comprender de qué manera los alimentos, la actividad física, las enfermedades y los medicamentos inciden en los niveles sanguíneos de glucosa. °· Conocer el nivel sanguíneo de glucosa en cualquier momento dado. Saber rápidamente si el nivel es bajo (hipoglucemia) o alto (hiperglucemia). °· Puede ser de ayuda para que usted y el médico sepan cómo ajustar los medicamentos. °¿CUÁNDO DEBO CONTROLAR EL NIVEL SANGUÍNEO DE GLUCOSA? °Siga las indicaciones del médico acerca de la frecuencia con la que debe controlar el nivel sanguíneo de glucosa. La frecuencia puede depender de: °· El tipo de diabetes que tenga. °· Si su diabetes está bajo control. °· Los medicamentos que toma. °Si usted tiene diabetes tipo 1: °· Controle su nivel sanguíneo de glucosa al menos dos veces al día. °· También controle su nivel sanguíneo de glucosa: °? Antes de cada inyección de insulina. °? Antes y después de hacer ejercicio. °? Entre las comidas. °? Dos horas después de una comida. °? Ocasionalmente, entre las 2:00 a. m. y las 3:00 a. m., como se lo hayan indicado. °? Antes de realizar tareas peligrosas, como manejar o usar maquinaria pesada. °? A la hora de acostarse. °· Es posible que deba controlar con más frecuencia los niveles sanguíneos de glucosa, hasta 6 a 10 veces por día: °? Si usa una bomba de insulina. °? Si necesita varias inyecciones diarias. °? Si su diabetes no está bien  controlada. °? Si está enfermo. °? Si tiene antecedentes de hipoglucemia grave. °? Si tiene antecedentes de no darse cuenta cuándo está bajando su nivel sanguíneo de glucosa (hipoglucemia asintomática). °Si usted tiene diabetes tipo 2: °· Si recibe insulina u otro medicamento para la diabetes, controle el nivel sanguíneo de glucosa al menos dos veces al día. °· Mientras reciba tratamiento intensivo con insulina, debe medirse el nivel sanguíneo de glucosa al menos 4 veces al día. Ocasionalmente, es posible que deba controlarse entre las 2:00 a. m. y las 3:00 a. m., según se lo indiquen. °· También controle su nivel sanguíneo de glucosa: °? Antes y después de hacer ejercicio. °? Antes de realizar tareas peligrosas, como manejar o usar maquinaria pesada. °· Es posible que deba controlar con más frecuencia los niveles sanguíneos de glucosa si: °? Es necesario ajustar la dosis de sus medicamentos. °? Su diabetes no está bien controlada. °? Está enfermo. °¿QUÉ ES UN REGISTRO DIARIO DEL NIVEL SANGUÍNEO DE GLUCOSA? °· Un registro diario es un registro de los valores de glucosa en la sangre. Puede ayudarles a usted y a su médico a: °? Buscar patrones en su nivel sanguíneo de glucosa durante el transcurso del tiempo. °? Ajustar su plan de control de la diabetes como sea necesario. °· Cada vez que controle su nivel sanguíneo de glucosa, anote el resultado y aquellos factores que pueden estar afectando su nivel sanguíneo de glucosa, como la dieta y la actividad física realizada en el día. °· La mayoría de los medidores de glucosa guardan un registro de las lecturas realizadas con el medidor. Algunos permiten descargar sus registros en   una computadora. ° °¿CÓMO ME CONTROLO EL NIVEL SANGUÍNEO DE GLUCOSA? °Siga los siguientes pasos para obtener lecturas precisas de su glucemia: °Materiales necesarios °· Medidor de glucosa en la sangre. °· Tiras reactivas para el medidor. Cada medidor tiene sus propias tiras reactivas. Debe usar  las tiras reactivas que trae su medidor. °· Una aguja para pincharse el dedo (lanceta). No utilice la misma lanceta en más de una ocasión. °· Un dispositivo que sujeta la lanceta (dispositivo de punción). °· Un diario o libro de anotaciones para anotar los resultados. °Procedimiento °· Lávese las manos con agua y jabón. °· Pínchese el costado del dedo (no la punta) con la lanceta. Use un dedo diferente cada vez. °· Frote suavemente el dedo hasta que aparezca una pequeña gota de sangre. °· Siga las instrucciones que vienen con el medidor para insertar la tira reactiva, aplicar la sangre sobre la tira y usar el medidor de glucosa en la sangre. °· Registre el resultado y las observaciones que desee. °Zonas del cuerpo alternativas para realizar las pruebas °· Algunos medidores le permiten tomar sangre para la prueba de otras zonas del cuerpo que no son el dedo (zonas alternativas). °· Si cree que tiene hipoglucemia o si tiene hipoglucemia asintomática, no utilice las zonas alternativas del cuerpo. En su lugar, use los dedos. °· Es posible que las zonas alternativas no sean tan precisas como los dedos porque el flujo de sangre es más lento en esas zonas. Esto significa que el resultado que obtiene de estas zonas puede estar retrasado y ser un poco diferente del resultado que obtendría del dedo. °· Los sitios alternativos más comunes son los siguientes: °? Los antebrazos. °? Los muslos. °? La palma de la mano. °Consejos adicionales °· Siempre tenga los insumos a mano. °· Todos los medidores de glucosa incluyen un número de teléfono "directo", disponible las 24 horas, al que podrá llamar si tiene preguntas o necesita ayuda. También puede consultar a su médico. °· Después de usar algunas cajas de tiras reactivas, ajuste (calibre) el medidor de glucemia según las instrucciones del medidor. °Esta información no tiene como fin reemplazar el consejo del médico. Asegúrese de hacerle al médico cualquier pregunta que  tenga. °Document Released: 09/05/2005 Document Revised: 12/28/2015 Document Reviewed: 02/15/2016 °Elsevier Interactive Patient Education © 2017 Elsevier Inc. ° °

## 2017-06-21 NOTE — Progress Notes (Signed)
Subjective:  Patient ID: Alexandra Aguirre, female    DOB: 11-Feb-1976  Age: 41 y.o. MRN: [id number]  CC: No chief complaint on file.  HPI Alexandra Aguirre is a 41 year-old female patient with a history of GERD and Type 2 Diabetes that presents today for follow-up. She admits adherence to medication and states that she checks her blood sugar sometimes. She says her blood sugar levels are anywhere from 140's-180's. She tries to eat less carbs and walks about 15 minutes 3-4 times a day. She admits to blurred vision but has been unable to see an opthalmology. She denies hypoglycemia and numbness. Admits to dizziness but takes antivert when it does happen.   Today she complains of irregular vaginal bleeding that started last month and was accompanied by cramping. She describes having blood clots that look like rubber bands. She states that her period isn't due till October 20 but has started bleeding again today.   Lastly, she reports an improvement on anxiety now that her son is now in rehabilation and getting the help he needs. She is requesting a dental referral and the flu vaccine today.   Past Medical History:  Diagnosis Date  . Abnormal Pap smear of cervix 09/24/2013   AGUS  . Asthma   . Diabetes mellitus without complication ([place])   . GERD (gastroesophageal reflux disease)    Past Surgical History:  Procedure Laterality Date  . CERVICAL BIOPSY  W/ LOOP ELECTRODE EXCISION    . removal of cyst Right 2011     Outpatient Medications Prior to Visit  Medication Sig Dispense Refill  . albuterol (PROVENTIL HFA;VENTOLIN HFA) 108 (90 BASE) MCG/ACT inhaler Inhale 2 puffs into the lungs every 6 (six) hours as needed for wheezing or shortness of breath. 1 Inhaler 3  . aspirin-acetaminophen-caffeine (EXCEDRIN MIGRAINE) 250-250-65 MG tablet Take 1 tablet by mouth as needed for headache or migraine. 30 tablet 3  . Blood Glucose Monitoring Suppl (TRUE METRIX METER) w/Device KIT Check blood sugar fasting in  the morning and at bedtime 1 kit 0  . cetirizine (ZYRTEC) 10 MG tablet Take 1 tablet (10 mg total) by mouth daily. 30 tablet 3  . fluticasone (FLONASE) 50 MCG/ACT nasal spray Place 2 sprays into both nostrils daily. 16 g 6  . glucose blood (TRUE METRIX BLOOD GLUCOSE TEST) test strip Use as instructed 100 each 12  . meclizine (ANTIVERT) 25 MG tablet Take 1 tablet (25 mg total) by mouth 3 (three) times daily as needed for dizziness. 30 tablet 0  . naproxen (NAPROSYN) 500 MG tablet Take 1 tablet (500 mg total) by mouth 2 (two) times daily with a meal. X 7 days then prn pain 60 tablet 0  . TRUEPLUS LANCETS 28G MISC Check blood sugar fasting and at bedtime 100 each 3  . amitriptyline (ELAVIL) 50 MG tablet Take 1 tablet (50 mg total) by mouth at bedtime. 30 tablet 3  . busPIRone (BUSPAR) 10 MG tablet Take 1 tablet (10 mg total) by mouth 2 (two) times daily. 60 tablet 3  . metFORMIN (GLUCOPHAGE) 1000 MG tablet Take 1 tablet (1,000 mg total) by mouth 2 (two) times daily with a meal. 180 tablet 3  . methocarbamol (ROBAXIN) 500 MG tablet Take 1 tablet (500 mg total) by mouth 4 (four) times daily. X 1 week then prn muscle pain 90 tablet 0  . omeprazole (PRILOSEC) 20 MG capsule Take 1 capsule (20 mg total) by mouth daily. 90 capsule 3  . ranitidine (ZANTAC) 150  MG capsule Take 1 capsule (150 mg total) by mouth daily. 90 capsule 3  . Vitamin D, Ergocalciferol, (DRISDOL) 50000 units CAPS capsule Take 1 capsule (50,000 Units total) by mouth every 7 (seven) days. 15 capsule 0   No facility-administered medications prior to visit.     ROS Review of Systems  Constitutional: Negative for fatigue and fever.  Eyes: Positive for visual disturbance.  Respiratory: Negative for cough and shortness of breath.   Cardiovascular: Negative for chest pain and leg swelling.  Gastrointestinal: Negative for abdominal distention, constipation and diarrhea.  Endocrine: Negative.   Genitourinary: Positive for vaginal bleeding.  Negative for difficulty urinating, frequency and urgency.  Musculoskeletal: Negative for back pain, joint swelling and neck pain.       Admits to painful varicose veins   Skin: Negative.   Neurological: Positive for dizziness. Negative for numbness and headaches.  Psychiatric/Behavioral: Negative.     Objective:  BP 111/75 (BP Location: Left Arm, Patient Position: Sitting, Cuff Size: Normal)   Pulse 71   Temp 98.2 F (36.8 C) (Oral)   Resp 18   Ht 5' 2"  (1.575 m)   Wt 195 lb 6.4 oz (88.6 kg)   LMP 05/24/2017 Comment: IUD  SpO2 100%   BMI 35.74 kg/m   BP/Weight 06/21/2017 06/01/2017 09/20/5850  Systolic BP 778 96 242  Diastolic BP 75 65 67  Wt. (Lbs) 195.4 195 -  BMI 35.74 35.67 -     Physical Exam  Constitutional: She is oriented to person, place, and time. She appears well-nourished. No distress.  HENT:  Head: Normocephalic and atraumatic.  Right Ear: External ear normal.  Left Ear: External ear normal.  Mouth/Throat: Oropharynx is clear and moist.  Several missing teeth   Eyes: Pupils are equal, round, and reactive to light.  Cardiovascular: Normal rate, regular rhythm and normal heart sounds.   Pulmonary/Chest: Effort normal and breath sounds normal. No respiratory distress. She has no wheezes.  Abdominal: Soft. Bowel sounds are normal.  Musculoskeletal: Normal range of motion. She exhibits no edema.  Varicose veins noted bilaterally on the posterior aspect of the knee.  Neurological: She is alert and oriented to person, place, and time. She has normal reflexes.  Skin: Skin is warm and dry.  Psychiatric: She has a normal mood and affect. Her behavior is normal.    Lab Results  Component Value Date   HGBA1C 7.8 06/21/2017    Assessment & Plan:   1. Type 2 diabetes mellitus with complication, without long-term current use of insulin (HCC) Aim for 30 minutes of exercise most days. Rethink what you drink. Water is great! Aim for 2-3 Carb Choices per meal (30-45  grams) +/- 1 either way  Aim for 0-15 Carbs per snack if hungry  Include protein in moderation with your meals and snacks  Consider reading food labels for Total Carbohydrate and Fat Grams of foods  Consider checking BG at alternate times per day  Continue taking medication as directed Be mindful about how much sugar you are adding to beverages and other foods. Fruit Punch - find one with no sugar  Measure and decrease portions of carbohydrate foods  Make your plate and don't go back for seconds  - POCT A1C - Glucose (CBG) - metFORMIN (GLUCOPHAGE) 1000 MG tablet; Take 1 tablet (1,000 mg total) by mouth 2 (two) times daily with a meal.  Dispense: 180 tablet; Refill: 3  2. Generalized anxiety disorder  - amitriptyline (ELAVIL) 50 MG tablet; Take 1  tablet (50 mg total) by mouth at bedtime.  Dispense: 30 tablet; Refill: 3 - busPIRone (BUSPAR) 10 MG tablet; Take 1 tablet (10 mg total) by mouth 2 (two) times daily.  Dispense: 60 tablet; Refill: 3  3. Muscle strain of right upper arm, initial encounter  - amitriptyline (ELAVIL) 50 MG tablet; Take 1 tablet (50 mg total) by mouth at bedtime.  Dispense: 30 tablet; Refill: 3 - methocarbamol (ROBAXIN) 500 MG tablet; Take 1 tablet (500 mg total) by mouth 4 (four) times daily. X 1 week then prn muscle pain  Dispense: 90 tablet; Refill: 0  4. Gastroesophageal reflux disease, esophagitis presence not specified  - omeprazole (PRILOSEC) 20 MG capsule; Take 1 capsule (20 mg total) by mouth daily.  Dispense: 90 capsule; Refill: 3 - ranitidine (ZANTAC) 150 MG capsule; Take 1 capsule (150 mg total) by mouth daily.  Dispense: 90 capsule; Refill: 3  5. Menorrhagia with irregular cycle  - US Pelvis Complete; Future - US Transvaginal Non-OB; Future  6. Vitamin D deficiency  - Vitamin D, Ergocalciferol, (DRISDOL) 50000 units CAPS capsule; Take 1 capsule (50,000 Units total) by mouth every 7 (seven) days.  Dispense: 15 capsule; Refill: 0  7. Dental  caries  - Ambulatory referral to Dentistry  Meds ordered this encounter  Medications  . amitriptyline (ELAVIL) 50 MG tablet    Sig: Take 1 tablet (50 mg total) by mouth at bedtime.    Dispense:  30 tablet    Refill:  3  . busPIRone (BUSPAR) 10 MG tablet    Sig: Take 1 tablet (10 mg total) by mouth 2 (two) times daily.    Dispense:  60 tablet    Refill:  3  . metFORMIN (GLUCOPHAGE) 1000 MG tablet    Sig: Take 1 tablet (1,000 mg total) by mouth 2 (two) times daily with a meal.    Dispense:  180 tablet    Refill:  3  . methocarbamol (ROBAXIN) 500 MG tablet    Sig: Take 1 tablet (500 mg total) by mouth 4 (four) times daily. X 1 week then prn muscle pain    Dispense:  90 tablet    Refill:  0  . omeprazole (PRILOSEC) 20 MG capsule    Sig: Take 1 capsule (20 mg total) by mouth daily.    Dispense:  90 capsule    Refill:  3  . ranitidine (ZANTAC) 150 MG capsule    Sig: Take 1 capsule (150 mg total) by mouth daily.    Dispense:  90 capsule    Refill:  3  . Vitamin D, Ergocalciferol, (DRISDOL) 50000 units CAPS capsule    Sig: Take 1 capsule (50,000 Units total) by mouth every 7 (seven) days.    Dispense:  15 capsule    Refill:  0    Follow-up: Return in about 3 months (around 09/21/2017) for Hemoglobin A1C and Follow up, DM, Follow up HTN.   Evaluation and management procedures were performed by me with DNP Student in attendance, note written by DNP student under my supervision and collaboration. I have reviewed the note and I agree with the management and plan.   Angelica Chessman, MD, [place], [place], [place], [place] and [facility], [place]

## 2017-06-26 ENCOUNTER — Ambulatory Visit: Payer: Self-pay

## 2017-07-04 ENCOUNTER — Ambulatory Visit: Payer: Self-pay

## 2017-07-05 ENCOUNTER — Ambulatory Visit (HOSPITAL_COMMUNITY)
Admission: RE | Admit: 2017-07-05 | Discharge: 2017-07-05 | Disposition: A | Discharge status: Home / Self Care | Payer: Self-pay | Source: Ambulatory Visit | Attending: Internal Medicine | Admitting: Internal Medicine

## 2017-07-05 DIAGNOSIS — Z975 Presence of (intrauterine) contraceptive device: Secondary | ICD-10-CM | POA: Insufficient documentation

## 2017-07-05 DIAGNOSIS — N854 Malposition of uterus: Secondary | ICD-10-CM | POA: Insufficient documentation

## 2017-07-05 DIAGNOSIS — N921 Excessive and frequent menstruation with irregular cycle: Secondary | ICD-10-CM | POA: Insufficient documentation

## 2017-07-06 ENCOUNTER — Encounter (HOSPITAL_COMMUNITY): Payer: Self-pay | Admitting: Family Medicine

## 2017-07-06 DIAGNOSIS — R51 Headache: Secondary | ICD-10-CM | POA: Insufficient documentation

## 2017-07-06 DIAGNOSIS — Z7982 Long term (current) use of aspirin: Secondary | ICD-10-CM | POA: Insufficient documentation

## 2017-07-06 DIAGNOSIS — E119 Type 2 diabetes mellitus without complications: Secondary | ICD-10-CM | POA: Insufficient documentation

## 2017-07-06 DIAGNOSIS — Z79899 Other long term (current) drug therapy: Secondary | ICD-10-CM | POA: Insufficient documentation

## 2017-07-06 DIAGNOSIS — R112 Nausea with vomiting, unspecified: Secondary | ICD-10-CM | POA: Insufficient documentation

## 2017-07-06 DIAGNOSIS — F419 Anxiety disorder, unspecified: Secondary | ICD-10-CM | POA: Insufficient documentation

## 2017-07-06 DIAGNOSIS — Z7984 Long term (current) use of oral hypoglycemic drugs: Secondary | ICD-10-CM | POA: Insufficient documentation

## 2017-07-06 DIAGNOSIS — J45909 Unspecified asthma, uncomplicated: Secondary | ICD-10-CM | POA: Insufficient documentation

## 2017-07-06 MED ORDER — IBUPROFEN 200 MG PO TABS
400.0000 mg | ORAL_TABLET | Freq: Once | ORAL | Status: AC | PRN
  Administered 2017-07-07: 400 mg via ORAL
  Filled 2017-07-06: qty 2

## 2017-07-06 MED ORDER — ONDANSETRON 4 MG PO TBDP
4.0000 mg | ORAL_TABLET | Freq: Once | ORAL | Status: AC | PRN
  Administered 2017-07-07: 4 mg via ORAL
  Filled 2017-07-06: qty 1

## 2017-07-06 NOTE — ED Triage Notes (Signed)
Patient arrives by EMS emotionally upset with migraine after hearing that her son "escaped from some facility he was at". Patient is non-English speaking and has a 41 yo son who is with her translating. Vomited x 1 prior to EMS and twice by EMS. Migraine headache.

## 2017-07-06 NOTE — ED Triage Notes (Signed)
CBG 198 per EMS

## 2017-07-07 ENCOUNTER — Emergency Department (HOSPITAL_COMMUNITY)
Admission: EM | Admit: 2017-07-07 | Discharge: 2017-07-07 | Disposition: A | Discharge status: Home / Self Care | Payer: Self-pay | Attending: Emergency Medicine | Admitting: Emergency Medicine

## 2017-07-07 DIAGNOSIS — R519 Headache, unspecified: Secondary | ICD-10-CM

## 2017-07-07 DIAGNOSIS — F419 Anxiety disorder, unspecified: Secondary | ICD-10-CM

## 2017-07-07 DIAGNOSIS — R51 Headache: Secondary | ICD-10-CM

## 2017-07-07 LAB — CBG MONITORING, ED: GLUCOSE-CAPILLARY: 152 mg/dL — AB (ref 65–99)

## 2017-07-07 MED ORDER — BUTALBITAL-APAP-CAFFEINE 50-325-40 MG PO TABS: 1.0000 | ORAL_TABLET | Freq: Four times a day (QID) | ORAL | 0 refills | Status: DC | PRN

## 2017-07-07 MED ORDER — KETOROLAC TROMETHAMINE 30 MG/ML IJ SOLN
30.0000 mg | Freq: Once | INTRAMUSCULAR | Status: AC
  Administered 2017-07-07: 30 mg via INTRAVENOUS
  Filled 2017-07-07: qty 1

## 2017-07-07 MED ORDER — SODIUM CHLORIDE 0.9 % IV BOLUS (SEPSIS)
1000.0000 mL | Freq: Once | INTRAVENOUS | Status: AC
  Administered 2017-07-07: 1000 mL via INTRAVENOUS

## 2017-07-07 MED ORDER — DIPHENHYDRAMINE HCL 50 MG/ML IJ SOLN
25.0000 mg | Freq: Once | INTRAMUSCULAR | Status: AC
  Administered 2017-07-07: 25 mg via INTRAVENOUS
  Filled 2017-07-07: qty 1

## 2017-07-07 MED ORDER — METOCLOPRAMIDE HCL 5 MG/ML IJ SOLN
10.0000 mg | Freq: Once | INTRAMUSCULAR | Status: AC
  Administered 2017-07-07: 10 mg via INTRAVENOUS
  Filled 2017-07-07: qty 2

## 2017-07-07 NOTE — ED Notes (Signed)
Bed: WA06 Expected date:  Expected time:  Means of arrival:  Comments: 

## 2017-07-07 NOTE — ED Provider Notes (Signed)
[facility] DEPT Provider Note   CSN: [id number] Arrival date & time: 07/06/17  2223     History   Chief Complaint Chief Complaint  Patient presents with  . Emotional upset  . Migraine    HPI Alexandra Aguirre is a 41 y.o. female.  HPI   41 year old Hispanic speaking female with history of asthma, diabetes, GERD, depression brought here via EMS from home accompanied by her son for evaluation of headache.  Hx obtain through [facility]. Pt developed gradual onset of throbbing headache like a vice grip around there temporal region and spread throughout entire head. Headache is now moderate to severe. Did report having nausea, vomited several times and report having sore throat after vomit.  No URI sxs.  No fever, chills, focal numbness or weakness, or rash.  No acute onset thunderclap headache.   Headache yesterday started once received info about her son who is in rehab for marijuana addition, who left from rehab and no one has any info of his whereabout.  Last migraine 5 yrs ago.   Mild photophobia.  Has episodes of vomiting yesterday and this am, improves with Zofran.  Did report mild difficulty breathing.  Denies tobacco or alcohol use.  Did voice concern of high blood sugar due to hx of diabetes.      Past Medical History:  Diagnosis Date  . Abnormal Pap smear of cervix 09/24/2013   AGUS  . Asthma   . Diabetes mellitus without complication ([place])   . GERD (gastroesophageal reflux disease)     Patient Active Problem List   Diagnosis Date Noted  . Dental caries 06/21/2017  . Vitamin D deficiency 03/01/2017  . Muscle strain of right upper arm 03/01/2017  . Type 2 diabetes mellitus with complication, without long-term current use of insulin ([place]) 10/12/2016  . Gastroesophageal reflux disease 10/12/2016  . Generalized anxiety disorder 10/12/2016  . IFG (impaired fasting glucose) 02/18/2014  . UTI (urinary tract infection) 01/30/2014  . Vaginal  bleeding between periods 01/30/2014  . Dysmenorrhea 01/30/2014  . Menorrhagia 01/30/2014  . Asthma with acute exacerbation 01/15/2014  . Vertigo 11/25/2013  . History of cervical dysplasia 11/01/2013  . Atypical glandular cells of undetermined significance (AGUS) on cervical Pap smear 10/08/2013  . Breast pain, right 10/08/2013  . Pedal edema 09/30/2013  . Migraine headache 09/30/2013  . Unspecified gastritis and gastroduodenitis without mention of hemorrhage 09/30/2013  . Back pain 09/30/2013    Past Surgical History:  Procedure Laterality Date  . CERVICAL BIOPSY  W/ LOOP ELECTRODE EXCISION    . removal of cyst Right 2011    OB History    Gravida Para Term Preterm AB Living   _0 SAB TAB Ectopic Multiple Live Births                   Home Medications    Prior to Admission medications   Medication Sig Start Date End Date Taking? Authorizing Provider  albuterol (PROVENTIL HFA;VENTOLIN HFA) 108 (90 BASE) MCG/ACT inhaler Inhale 2 puffs into the lungs every 6 (six) hours as needed for wheezing or shortness of breath. 01/14/14  Yes Chambliss, Jeb Levering, MD  amitriptyline (ELAVIL) 50 MG tablet Take 1 tablet (50 mg total) by mouth at bedtime. 06/21/17  Yes Tresa Garter, MD  cetirizine (ZYRTEC) 10 MG tablet Take 1 tablet (10 mg total) by mouth daily. 02/13/15  Yes Advani, Vernon Prey, MD  fluticasone (FLONASE) 50  MCG/ACT nasal spray Place 2 sprays into both nostrils daily. Patient taking differently: Place 2 sprays into both nostrils daily as needed for allergies.  06/01/17  Yes McClung, Angela M, PA-C  meclizine (ANTIVERT) 25 MG tablet Take 1 tablet (25 mg total) by mouth 3 (three) times daily as needed for dizziness. 06/01/17  Yes Freeman Caldron M, PA-C  metFORMIN (GLUCOPHAGE) 1000 MG tablet Take 1 tablet (1,000 mg total) by mouth 2 (two) times daily with a meal. 06/21/17  Yes Jegede, Olugbemiga E, MD  methocarbamol (ROBAXIN) 500 MG tablet Take 1 tablet (500 mg total) by  mouth 4 (four) times daily. X 1 week then prn muscle pain 06/21/17  Yes Jegede, Olugbemiga E, MD  naproxen (NAPROSYN) 500 MG tablet Take 1 tablet (500 mg total) by mouth 2 (two) times daily with a meal. X 7 days then prn pain 06/01/17  Yes McClung, Angela M, PA-C  omeprazole (PRILOSEC) 20 MG capsule Take 1 capsule (20 mg total) by mouth daily. 06/21/17  Yes Tresa Garter, MD  ranitidine (ZANTAC) 150 MG capsule Take 1 capsule (150 mg total) by mouth daily. Patient taking differently: Take 150 mg by mouth daily as needed for heartburn.  06/21/17  Yes Tresa Garter, MD  Vitamin D, Ergocalciferol, (DRISDOL) 50000 units CAPS capsule Take 1 capsule (50,000 Units total) by mouth every 7 (seven) days. Patient taking differently: Take 50,000 Units by mouth every Monday.  06/21/17  Yes Tresa Garter, MD  aspirin-acetaminophen-caffeine (EXCEDRIN MIGRAINE) [phone] MG tablet Take 1 tablet by mouth as needed for headache or migraine. Patient not taking: Reported on 07/07/2017 10/12/16   Tresa Garter, MD  Blood Glucose Monitoring Suppl (TRUE METRIX METER) w/Device KIT Check blood sugar fasting in the morning and at bedtime 09/30/16   Argentina Donovan, PA-C  busPIRone (BUSPAR) 10 MG tablet Take 1 tablet (10 mg total) by mouth 2 (two) times daily. Patient not taking: Reported on 07/07/2017 06/21/17   Tresa Garter, MD  glucose blood (TRUE METRIX BLOOD GLUCOSE TEST) test strip Use as instructed 09/30/16   Argentina Donovan, PA-C  TRUEPLUS LANCETS 28G MISC Check blood sugar fasting and at bedtime 09/30/16   Argentina Donovan, PA-C    Family History Family History  Problem Relation Age of Onset  . Cancer Mother        cervical/ovarian  . Diabetes Mother   . Diabetes Maternal Uncle   . Diabetes Brother   . Diabetes Maternal Uncle   . Diabetes Maternal Uncle     Social History Social History  Substance Use Topics  . Smoking status: Never Smoker  . Smokeless tobacco: Never Used   . Alcohol use No     Allergies   Fruit & vegetable daily [nutritional supplements]   Review of Systems Review of Systems  All other systems reviewed and are negative.    Physical Exam Updated Vital Signs BP 114/60 (BP Location: Left Arm)   Pulse 79   Temp 97.7 F (36.5 C) (Oral)   Resp 18   Ht 5' 2" (1.575 m)   Wt 86.2 kg (190 lb)   LMP 06/24/2017   SpO2 96%   BMI 34.75 kg/m   Physical Exam  Constitutional: She is oriented to person, place, and time. She appears well-developed and well-nourished. No distress.  bese female appears uncomfortable but nontoxic in appearance  HENT:  Head: Atraumatic.  Right Ear: External ear normal.  Left Ear: External ear normal.  Nose: Nose normal.  Mouth/Throat: Oropharynx is clear and moist.  Eyes: Conjunctivae are normal.  Neck: Neck supple.  No nuchal rigidity  Cardiovascular: Normal rate and regular rhythm.   Pulmonary/Chest: Effort normal and breath sounds normal.  Neurological: She is alert and oriented to person, place, and time.  Neurologic exam:  Speech clear, pupils equal round reactive to light, extraocular movements intact  Normal peripheral visual fields Cranial nerves III through XII normal including no facial droop Follows commands, moves all extremities x4, normal strength to bilateral upper and lower extremities at all major muscle groups including grip Sensation normal to light touch  Coordination intact, no limb ataxia, finger-nose-finger normal Rapid alternating movements normal No pronator drift Gait normal   Skin: No rash noted.  Psychiatric: She has a normal mood and affect.  Nursing note and vitals reviewed.    ED Treatments / Results  Labs (all labs ordered are listed, but only abnormal results are displayed) Labs Reviewed  CBG MONITORING, ED - Abnormal; Notable for the following:       Result Value   Glucose-Capillary 152 (*)    All other components within normal limits    EKG  EKG  Interpretation None       Radiology US Transvaginal Non-ob  Result Date: 07/05/2017 CLINICAL DATA:  Menorrhagia with irregular cycles EXAM: TRANSABDOMINAL AND TRANSVAGINAL ULTRASOUND OF PELVIS TECHNIQUE: Both transabdominal and transvaginal ultrasound examinations of the pelvis were performed. Transabdominal technique was performed for global imaging of the pelvis including uterus, ovaries, adnexal regions, and pelvic cul-de-sac. It was necessary to proceed with endovaginal exam following the transabdominal exam to visualize the uterus, endometrium, ovaries and adnexa . COMPARISON:  12/28/2016 FINDINGS: Uterus Measurements: 7.6 x 4.8 x 4.7 cm, retroverted. No fibroids or other mass visualized. Endometrium Thickness: 9 mm in thickness. IUD in expected position. No focal abnormality visualized. Right ovary Measurements: 3.4 x 2.2 x 3.0 cm. Normal appearance/no adnexal mass. Left ovary Measurements: 3.2 x 2.2 x 1.9 cm. Normal appearance/no adnexal mass. Other findings No abnormal free fluid. IMPRESSION: Retroverted uterus with IUD in expected position. No acute or significant abnormality. Electronically Signed   By: Rolm Baptise M.D.   On: 07/05/2017 10:49   US Pelvis Complete  Result Date: 07/05/2017 CLINICAL DATA:  Menorrhagia with irregular cycles EXAM: TRANSABDOMINAL AND TRANSVAGINAL ULTRASOUND OF PELVIS TECHNIQUE: Both transabdominal and transvaginal ultrasound examinations of the pelvis were performed. Transabdominal technique was performed for global imaging of the pelvis including uterus, ovaries, adnexal regions, and pelvic cul-de-sac. It was necessary to proceed with endovaginal exam following the transabdominal exam to visualize the uterus, endometrium, ovaries and adnexa . COMPARISON:  12/28/2016 FINDINGS: Uterus Measurements: 7.6 x 4.8 x 4.7 cm, retroverted. No fibroids or other mass visualized. Endometrium Thickness: 9 mm in thickness. IUD in expected position. No focal abnormality  visualized. Right ovary Measurements: 3.4 x 2.2 x 3.0 cm. Normal appearance/no adnexal mass. Left ovary Measurements: 3.2 x 2.2 x 1.9 cm. Normal appearance/no adnexal mass. Other findings No abnormal free fluid. IMPRESSION: Retroverted uterus with IUD in expected position. No acute or significant abnormality. Electronically Signed   By: Rolm Baptise M.D.   On: 07/05/2017 10:49    Procedures Procedures (including critical care time)  Medications Ordered in ED Medications  ondansetron (ZOFRAN-ODT) disintegrating tablet 4 mg (4 mg Oral Given 07/07/17 0002)  ibuprofen (ADVIL,MOTRIN) tablet 400 mg (400 mg Oral Given 07/07/17 0002)     Initial Impression / Assessment and Plan / ED Course  I have reviewed the  triage vital signs and the nursing notes.  Pertinent labs & imaging results that were available during my care of the patient were reviewed by me and considered in my medical decision making (see chart for details).     BP 127/73 (BP Location: Right Arm)   Pulse 86   Temp 97.7 F (36.5 C) (Oral)   Resp 16   Ht 5' 2" (1.575 m)   Wt 86.2 kg (190 lb)   LMP 06/24/2017   SpO2 94%   BMI 34.75 kg/m    Final Clinical Impressions(s) / ED Diagnoses   Final diagnoses:  Bad headache  Anxiety    New Prescriptions New Prescriptions   BUTALBITAL-ACETAMINOPHEN-CAFFEINE (FIORICET, ESGIC) 50-325-40 MG TABLET    Take 1-2 tablets by mouth every 6 (six) hours as needed for headache.   9:53 AM Headache similar to previous, no fever, neck stiffness, neuro findings or new symptoms to suggest more serious etiology.  I don't think SAH, ICH, meningitis, encephalitis, mass at this time.  No recent trauma.  I don't feel imaging necessary at this time.  Plan to control symptoms.  1:38 PM Headache is significant improved with treatment. Recommend patient to follow-up with primary care provider for further valuation of a headache. Fioricet prescribed for headache as needed. Her blood sugar is 152  today.   , , PA-C 07/07/17 1338    Haviland, Julie, MD 07/07/17 1438  

## 2017-07-25 ENCOUNTER — Telehealth: Payer: Self-pay | Admitting: Internal Medicine

## 2017-07-25 NOTE — Telephone Encounter (Signed)
Pt called to request the result for the US please call pt, she speak spanish

## 2017-08-02 NOTE — Telephone Encounter (Signed)
,   Alexandra Blakeslugbemiga E, MD  Margaretmary LombardLisbon, Nubia K, CMA        Pelvis ultrasound revealed no significant abnormality with IUD in expected position   With assistance of interpreter, Alexandra Aguirre [phone] with [facility] [employer], Pt is aware of results.

## 2017-08-08 ENCOUNTER — Emergency Department (HOSPITAL_COMMUNITY): Payer: Self-pay

## 2017-08-08 ENCOUNTER — Encounter (HOSPITAL_COMMUNITY): Payer: Self-pay | Admitting: Emergency Medicine

## 2017-08-08 ENCOUNTER — Emergency Department (HOSPITAL_COMMUNITY)
Admission: EM | Admit: 2017-08-08 | Discharge: 2017-08-09 | Disposition: A | Discharge status: Home / Self Care | Payer: Self-pay | Attending: Emergency Medicine | Admitting: Emergency Medicine

## 2017-08-08 DIAGNOSIS — E119 Type 2 diabetes mellitus without complications: Secondary | ICD-10-CM | POA: Insufficient documentation

## 2017-08-08 DIAGNOSIS — R1030 Lower abdominal pain, unspecified: Secondary | ICD-10-CM

## 2017-08-08 DIAGNOSIS — Z79899 Other long term (current) drug therapy: Secondary | ICD-10-CM | POA: Insufficient documentation

## 2017-08-08 DIAGNOSIS — R1032 Left lower quadrant pain: Secondary | ICD-10-CM | POA: Insufficient documentation

## 2017-08-08 DIAGNOSIS — Z7984 Long term (current) use of oral hypoglycemic drugs: Secondary | ICD-10-CM | POA: Insufficient documentation

## 2017-08-08 DIAGNOSIS — R1031 Right lower quadrant pain: Secondary | ICD-10-CM | POA: Insufficient documentation

## 2017-08-08 DIAGNOSIS — J45909 Unspecified asthma, uncomplicated: Secondary | ICD-10-CM | POA: Insufficient documentation

## 2017-08-08 LAB — BASIC METABOLIC PANEL
ANION GAP: 12 (ref 5–15)
BUN: 7 mg/dL (ref 6–20)
CALCIUM: 8.6 mg/dL — AB (ref 8.9–10.3)
CO2: 22 mmol/L (ref 22–32)
Chloride: 97 mmol/L — ABNORMAL LOW (ref 101–111)
Creatinine, Ser: 0.63 mg/dL (ref 0.44–1.00)
GFR calc Af Amer: >60 mL/min (ref >60)
GFR calc non Af Amer: >60 mL/min (ref >60)
GLUCOSE: 273 mg/dL — AB (ref 65–99)
Potassium: 3.5 mmol/L (ref 3.5–5.1)
Sodium: 131 mmol/L — ABNORMAL LOW (ref 135–145)

## 2017-08-08 LAB — CBC
HCT: 39.2 % (ref 36.0–46.0)
Hemoglobin: 13.4 g/dL (ref 12.0–15.0)
MCH: 29.5 pg (ref 26.0–34.0)
MCHC: 34.2 g/dL (ref 30.0–36.0)
MCV: 86.2 fL (ref 78.0–100.0)
PLATELETS: 378 10*3/uL (ref 150–400)
RBC: 4.55 MIL/uL (ref 3.87–5.11)
RDW: 12.8 % (ref 11.5–15.5)
WBC: 10.8 10*3/uL — AB (ref 4.0–10.5)

## 2017-08-08 LAB — WET PREP, GENITAL
CLUE CELLS WET PREP: NONE SEEN
Sperm: NONE SEEN
TRICH WET PREP: NONE SEEN
Yeast Wet Prep HPF POC: NONE SEEN

## 2017-08-08 LAB — URINALYSIS, ROUTINE W REFLEX MICROSCOPIC
BACTERIA UA: NONE SEEN
BILIRUBIN URINE: NEGATIVE
Glucose, UA: >=500 mg/dL — AB
HGB URINE DIPSTICK: NEGATIVE
Ketones, ur: 5 mg/dL — AB
Leukocytes, UA: NEGATIVE
Nitrite: NEGATIVE
PROTEIN: NEGATIVE mg/dL
RBC / HPF: NONE SEEN RBC/hpf (ref 0–5)
Specific Gravity, Urine: 1.025 (ref 1.005–1.030)
pH: 5 (ref 5.0–8.0)

## 2017-08-08 LAB — I-STAT BETA HCG BLOOD, ED (MC, WL, AP ONLY)

## 2017-08-08 MED ORDER — IOPAMIDOL (ISOVUE-300) INJECTION 61%
INTRAVENOUS | Status: AC
  Administered 2017-08-08: 100 mL
  Filled 2017-08-08: qty 100

## 2017-08-08 MED ORDER — SODIUM CHLORIDE 0.9 % IV BOLUS (SEPSIS)
1000.0000 mL | Freq: Once | INTRAVENOUS | Status: AC
  Administered 2017-08-08: 1000 mL via INTRAVENOUS

## 2017-08-08 MED ORDER — MORPHINE SULFATE (PF) 4 MG/ML IV SOLN
4.0000 mg | Freq: Once | INTRAVENOUS | Status: AC
  Administered 2017-08-08: 4 mg via INTRAVENOUS
  Filled 2017-08-08: qty 1

## 2017-08-08 NOTE — ED Triage Notes (Signed)
Pt reports lower abd pain and right flank pain since yesterday, pt also reports some blood in her urine and vomiting.

## 2017-08-08 NOTE — ED Provider Notes (Signed)
[facility]ENC[facility] Provider Note   CSN: [id number] Arrival date & time: 08/08/17  1839     History   Chief Complaint Chief Complaint  Patient presents with  . Abdominal Pain    HPI Alexandra Aguirre is a 41 y.o. female.  HPI Pt starting having abdominal pain yesterday.  The pain is in the lower abdomen on both sides.  The pain goes to the back.  Today it increased in severity.  SHe started having vaginal bleeding.  She has noticed blood in her urine.  No fevers.  She did vomit today once.   No diarrhea Past Medical History:  Diagnosis Date  . Abnormal Pap smear of cervix 09/24/2013   AGUS  . Asthma   . Diabetes mellitus without complication (HCC)   . GERD (gastroesophageal reflux disease)     Patient Active Problem List   Diagnosis Date Noted  . Dental caries 06/21/2017  . Vitamin D deficiency 03/01/2017  . Muscle strain of right upper arm 03/01/2017  . Type 2 diabetes mellitus with complication, without long-term current use of insulin (HCC) 10/12/2016  . Gastroesophageal reflux disease 10/12/2016  . Generalized anxiety disorder 10/12/2016  . IFG (impaired fasting glucose) 02/18/2014  . UTI (urinary tract infection) 01/30/2014  . Vaginal bleeding between periods 01/30/2014  . Dysmenorrhea 01/30/2014  . Menorrhagia 01/30/2014  . Asthma with acute exacerbation 01/15/2014  . Vertigo 11/25/2013  . History of cervical dysplasia 11/01/2013  . Atypical glandular cells of undetermined significance (AGUS) on cervical Pap smear 10/08/2013  . Breast pain, right 10/08/2013  . Pedal edema 09/30/2013  . Migraine headache 09/30/2013  . Unspecified gastritis and gastroduodenitis without mention of hemorrhage 09/30/2013  . Back pain 09/30/2013    Past Surgical History:  Procedure Laterality Date  . CERVICAL BIOPSY  W/ LOOP ELECTRODE EXCISION    . removal of cyst Right 2011    OB History    Gravida Para Term Preterm AB Living   5 5 5     5    SAB  TAB Ectopic Multiple Live Births                   Home Medications    Prior to Admission medications   Medication Sig Start Date End Date Taking? Authorizing Provider  albuterol (PROVENTIL HFA;VENTOLIN HFA) 108 (90 BASE) MCG/ACT inhaler Inhale 2 puffs into the lungs every 6 (six) hours as needed for wheezing or shortness of breath. 01/14/14  Yes Chambliss, Estill BattenMarshall L, MD  amitriptyline (ELAVIL) 50 MG tablet Take 1 tablet (50 mg total) by mouth at bedtime. Patient taking differently: Take 50 mg by mouth at bedtime as needed.  06/21/17  Yes Quentin AngstJegede, Olugbemiga E, MD  aspirin-acetaminophen-caffeine (EXCEDRIN MIGRAINE) [phone] MG tablet Take 1 tablet by mouth as needed for headache or migraine. 10/12/16  Yes Quentin AngstJegede, Olugbemiga E, MD  butalbital-acetaminophen-caffeine (FIORICET, ESGIC) 50-325-40 MG tablet Take 1-2 tablets by mouth every 6 (six) hours as needed for headache. 07/07/17 07/07/18 Yes Fayrene Helperran, Bowie, PA-C  meclizine (ANTIVERT) 25 MG tablet Take 1 tablet (25 mg total) by mouth 3 (three) times daily as needed for dizziness. 06/01/17  Yes Georgian CoMcClung, Angela M, PA-C  metFORMIN (GLUCOPHAGE) 1000 MG tablet Take 1 tablet (1,000 mg total) by mouth 2 (two) times daily with a meal. 06/21/17  Yes Jegede, Olugbemiga E, MD  methocarbamol (ROBAXIN) 500 MG tablet Take 1 tablet (500 mg total) by mouth 4 (four) times daily. X 1 week then prn muscle pain  06/21/17  Yes Quentin Angst, MD  omeprazole (PRILOSEC) 20 MG capsule Take 1 capsule (20 mg total) by mouth daily. 06/21/17  Yes Quentin Angst, MD  ranitidine (ZANTAC) 150 MG capsule Take 1 capsule (150 mg total) by mouth daily. Patient taking differently: Take 150 mg by mouth daily as needed for heartburn.  06/21/17  Yes Quentin Angst, MD  Vitamin D, Ergocalciferol, (DRISDOL) 50000 units CAPS capsule Take 1 capsule (50,000 Units total) by mouth every 7 (seven) days. Patient taking differently: Take 50,000 Units by mouth every Monday.  06/21/17   Yes Jegede, Phylliss Blakes, MD  busPIRone (BUSPAR) 10 MG tablet Take 1 tablet (10 mg total) by mouth 2 (two) times daily. Patient not taking: Reported on 07/07/2017 06/21/17   Quentin Angst, MD  cetirizine (ZYRTEC) 10 MG tablet Take 1 tablet (10 mg total) by mouth daily. Patient not taking: Reported on 08/08/2017 02/13/15   Doris Cheadle, MD  doxycycline (VIBRAMYCIN) 100 MG capsule Take 1 capsule (100 mg total) by mouth 2 (two) times daily. 08/09/17   Linwood Dibbles, MD  fluticasone (FLONASE) 50 MCG/ACT nasal spray Place 2 sprays into both nostrils daily. Patient not taking: Reported on 08/08/2017 06/01/17   Anders Simmonds, PA-C  naproxen (NAPROSYN) 500 MG tablet Take 1 tablet (500 mg total) by mouth 2 (two) times daily. 08/09/17   Linwood Dibbles, MD    Family History Family History  Problem Relation Age of Onset  . Cancer Mother        cervical/ovarian  . Diabetes Mother   . Diabetes Maternal Uncle   . Diabetes Brother   . Diabetes Maternal Uncle   . Diabetes Maternal Uncle     Social History Social History   Tobacco Use  . Smoking status: Never Smoker  . Smokeless tobacco: Never Used  Substance Use Topics  . Alcohol use: No  . Drug use: No     Allergies   Fruit & vegetable daily [nutritional supplements]   Review of Systems Review of Systems  All other systems reviewed and are negative.    Physical Exam Updated Vital Signs BP 107/72   Pulse 84   Temp 98.1 F (36.7 C) (Oral)   Resp 16   LMP 06/30/2017 (Approximate)   SpO2 99%   Physical Exam  Constitutional: She appears well-developed and well-nourished. No distress.  HENT:  Head: Normocephalic and atraumatic.  Right Ear: External ear normal.  Left Ear: External ear normal.  Eyes: Conjunctivae are normal. Right eye exhibits no discharge. Left eye exhibits no discharge. No scleral icterus.  Neck: Neck supple. No tracheal deviation present.  Cardiovascular: Normal rate, regular rhythm and intact distal  pulses.  Pulmonary/Chest: Effort normal and breath sounds normal. No stridor. No respiratory distress. She has no wheezes. She has no rales.  Abdominal: Soft. Bowel sounds are normal. She exhibits no distension. There is tenderness in the suprapubic area and left lower quadrant. There is no rebound and no guarding.  Genitourinary: Uterus normal. Uterus is not tender. Cervix exhibits no motion tenderness and no discharge. Right adnexum displays no mass. Left adnexum displays no mass. No tenderness or bleeding in the vagina.  Musculoskeletal: She exhibits no edema or tenderness.  Neurological: She is alert. She has normal strength. No cranial nerve deficit (no facial droop, extraocular movements intact, no slurred speech) or sensory deficit. She exhibits normal muscle tone. She displays no seizure activity. Coordination normal.  Skin: Skin is warm and dry. No rash noted.  Psychiatric: She has a normal mood and affect.  Nursing note and vitals reviewed.    ED Treatments / Results  Labs (all labs ordered are listed, but only abnormal results are displayed) Labs Reviewed  WET PREP, GENITAL - Abnormal; Notable for the following components:      Result Value   WBC, Wet Prep HPF POC MANY (*)    All other components within normal limits  URINALYSIS, ROUTINE W REFLEX MICROSCOPIC - Abnormal; Notable for the following components:   Glucose, UA >=500 (*)    Ketones, ur 5 (*)    Squamous Epithelial / LPF 0-5 (*)    All other components within normal limits  BASIC METABOLIC PANEL - Abnormal; Notable for the following components:   Sodium 131 (*)    Chloride 97 (*)    Glucose, Bld 273 (*)    Calcium 8.6 (*)    All other components within normal limits  CBC - Abnormal; Notable for the following components:   WBC 10.8 (*)    All other components within normal limits  RPR  HIV ANTIBODY (ROUTINE TESTING)  I-STAT BETA HCG BLOOD, ED ([facility], [facility], [facility] ONLY)  GC/CHLAMYDIA PROBE AMP ([place]) NOT AT [facility]      Radiology Ct Abdomen Pelvis W Contrast  Result Date: 08/09/2017 CLINICAL DATA:  41 year old female with abdominal pain. Concern for diverticulitis. EXAM: CT ABDOMEN AND PELVIS WITH CONTRAST TECHNIQUE: Multidetector CT imaging of the abdomen and pelvis was performed using the standard protocol following bolus administration of intravenous contrast. CONTRAST:  ISOVUE-300 IOPAMIDOL (ISOVUE-300) INJECTION 61% COMPARISON:  Pelvic ultrasound dated 07/05/2017 and CT dated 08/15/2014 FINDINGS: Lower chest: The visualized lung bases are clear. There is eventration of the left hemidiaphragm. No intra-abdominal free air or free fluid. Hepatobiliary: The liver is enlarged measuring 18 cm in midclavicular length. Diffuse fatty infiltration of the liver. No intrahepatic biliary ductal dilatation. The gallbladder is unremarkable. Pancreas: Unremarkable. No pancreatic ductal dilatation or surrounding inflammatory changes. Spleen: Normal in size without focal abnormality. Adrenals/Urinary Tract: Adrenal glands are unremarkable. Kidneys are normal, without renal calculi, focal lesion, or hydronephrosis. Bladder is unremarkable. Stomach/Bowel: Stomach is within normal limits. Appendix appears normal. No evidence of bowel wall thickening, distention, or inflammatory changes. Vascular/Lymphatic: No significant vascular findings are present. No enlarged abdominal or pelvic lymph nodes. Reproductive: An intrauterine device is noted which appears in appropriate positioning. Evaluation for IUD positioning however is very limited on CT. The ovaries appear grossly unremarkable as visualized. Other: None Musculoskeletal: No acute or significant osseous findings. IMPRESSION: 1. No acute intra-abdominal or pelvic pathology. No bowel obstruction or active inflammation. Normal appendix. 2. Mild hepatomegaly with fatty infiltration of the liver. Electronically Signed   By: Elgie Collard M.D.   On: 08/09/2017 00:02     Procedures Procedures (including critical care time)  Medications Ordered in ED Medications  cefTRIAXone (ROCEPHIN) injection 250 mg (not administered)  morphine 4 MG/ML injection 4 mg (4 mg Intravenous Given 08/08/17 2217)  sodium chloride 0.9 % bolus 1,000 mL (0 mLs Intravenous Stopped 08/08/17 2317)  iopamidol (ISOVUE-300) 61 % injection (100 mLs  Contrast Given 08/08/17 2336)     Initial Impression / Assessment and Plan / ED Course  I have reviewed the triage vital signs and the nursing notes.  Pertinent labs & imaging results that were available during my care of the patient were reviewed by me and considered in my medical decision making (see chart for details).   Pt presents with lower  abdominal pain.  Complaints of vaginal bleeding.  No bleeding noted on pelvic exam.  CT scan without acute findings.  Will dc home on oral abx to cover for cervicitis.  Follow up with OB GYN/pcp  Final Clinical Impressions(s) / ED Diagnoses   Final diagnoses:  Lower abdominal pain    ED Discharge Orders        Ordered    doxycycline (VIBRAMYCIN) 100 MG capsule  2 times daily     08/09/17 0017    naproxen (NAPROSYN) 500 MG tablet  2 times daily     08/09/17 0017       Linwood DibblesKnapp, Kambrey Hagger, MD 08/09/17 [phone]

## 2017-08-08 NOTE — ED Notes (Signed)
Patient transported to CT 

## 2017-08-09 LAB — GC/CHLAMYDIA PROBE AMP (~~LOC~~) NOT AT ARMC
Chlamydia: NEGATIVE
Neisseria Gonorrhea: NEGATIVE

## 2017-08-09 LAB — HIV ANTIBODY (ROUTINE TESTING W REFLEX): HIV SCREEN 4TH GENERATION: NONREACTIVE

## 2017-08-09 LAB — RPR: RPR: NONREACTIVE

## 2017-08-09 MED ORDER — DOXYCYCLINE HYCLATE 100 MG PO CAPS: 100.0000 mg | ORAL_CAPSULE | Freq: Two times a day (BID) | ORAL | 0 refills | Status: DC

## 2017-08-09 MED ORDER — CEFTRIAXONE SODIUM 250 MG IJ SOLR
250.0000 mg | Freq: Once | INTRAMUSCULAR | Status: AC
  Administered 2017-08-09: 250 mg via INTRAMUSCULAR
  Filled 2017-08-09: qty 250

## 2017-08-09 MED ORDER — LIDOCAINE HCL (PF) 1 % IJ SOLN
INTRAMUSCULAR | Status: AC
  Filled 2017-08-09: qty 5

## 2017-08-09 MED ORDER — NAPROXEN 500 MG PO TABS: 500.0000 mg | ORAL_TABLET | Freq: Two times a day (BID) | ORAL | 0 refills | Status: DC

## 2017-08-09 NOTE — ED Notes (Signed)
Discharge instructions  Reviewed using interpreter via phone. Interpreter [id number]

## 2017-08-09 NOTE — Discharge Instructions (Signed)
Follow up with your primary care doctor, take the medications as prescribed, return for worsening symptoms

## 2017-08-23 ENCOUNTER — Ambulatory Visit: Payer: Self-pay | Attending: Internal Medicine | Admitting: Physician Assistant

## 2017-08-23 ENCOUNTER — Other Ambulatory Visit: Payer: Self-pay

## 2017-08-23 ENCOUNTER — Encounter: Payer: Self-pay | Admitting: Physician Assistant

## 2017-08-23 VITALS — BP 106/70 | HR 73 | Temp 98.3°F | Resp 16 | Wt 194.8 lb

## 2017-08-23 DIAGNOSIS — Z7984 Long term (current) use of oral hypoglycemic drugs: Secondary | ICD-10-CM | POA: Insufficient documentation

## 2017-08-23 DIAGNOSIS — Z79899 Other long term (current) drug therapy: Secondary | ICD-10-CM | POA: Insufficient documentation

## 2017-08-23 DIAGNOSIS — J45909 Unspecified asthma, uncomplicated: Secondary | ICD-10-CM | POA: Insufficient documentation

## 2017-08-23 DIAGNOSIS — B379 Candidiasis, unspecified: Secondary | ICD-10-CM | POA: Insufficient documentation

## 2017-08-23 DIAGNOSIS — Z789 Other specified health status: Secondary | ICD-10-CM

## 2017-08-23 DIAGNOSIS — M545 Low back pain, unspecified: Secondary | ICD-10-CM

## 2017-08-23 DIAGNOSIS — Z7982 Long term (current) use of aspirin: Secondary | ICD-10-CM | POA: Insufficient documentation

## 2017-08-23 DIAGNOSIS — K219 Gastro-esophageal reflux disease without esophagitis: Secondary | ICD-10-CM | POA: Insufficient documentation

## 2017-08-23 DIAGNOSIS — E118 Type 2 diabetes mellitus with unspecified complications: Secondary | ICD-10-CM | POA: Insufficient documentation

## 2017-08-23 DIAGNOSIS — R1030 Lower abdominal pain, unspecified: Secondary | ICD-10-CM | POA: Insufficient documentation

## 2017-08-23 LAB — POCT URINALYSIS DIPSTICK
BILIRUBIN UA: NEGATIVE
Glucose, UA: 500
KETONES UA: NEGATIVE
Leukocytes, UA: NEGATIVE
Nitrite, UA: NEGATIVE
PH UA: 5 (ref 5.0–8.0)
Protein, UA: NEGATIVE
Spec Grav, UA: 1.02 (ref 1.010–1.025)
Urobilinogen, UA: 0.2 E.U./dL

## 2017-08-23 LAB — GLUCOSE, POCT (MANUAL RESULT ENTRY)
POC Glucose: 321 mg/dl — AB (ref 70–99)
POC Glucose: 316 mg/dl — AB (ref 70–99)

## 2017-08-23 MED ORDER — INSULIN ASPART 100 UNIT/ML ~~LOC~~ SOLN
20.0000 [IU] | Freq: Once | SUBCUTANEOUS | Status: AC
  Administered 2017-08-23: 20 [IU] via SUBCUTANEOUS

## 2017-08-23 MED ORDER — FLUCONAZOLE 150 MG PO TABS: ORAL_TABLET | ORAL | 0 refills | Status: DC

## 2017-08-23 MED ORDER — MELOXICAM 15 MG PO TABS: 15.0000 mg | ORAL_TABLET | Freq: Every day | ORAL | 1 refills | Status: DC

## 2017-08-23 NOTE — Patient Instructions (Signed)
Check blood sugars 3 times daily and record and bring to next visit 
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
## 2017-09-06 ENCOUNTER — Inpatient Hospital Stay: Payer: Self-pay | Admitting: Internal Medicine

## 2017-09-27 ENCOUNTER — Ambulatory Visit: Payer: Self-pay | Admitting: Internal Medicine

## 2017-10-10 ENCOUNTER — Ambulatory Visit: Payer: Self-pay

## 2017-10-11 ENCOUNTER — Encounter: Payer: Self-pay | Admitting: Internal Medicine

## 2017-10-11 ENCOUNTER — Ambulatory Visit: Payer: Self-pay | Attending: Internal Medicine | Admitting: Internal Medicine

## 2017-10-11 VITALS — BP 99/72 | HR 68 | Temp 98.6°F | Resp 18 | Ht 62.0 in | Wt 195.0 lb

## 2017-10-11 DIAGNOSIS — S46911A Strain of unspecified muscle, fascia and tendon at shoulder and upper arm level, right arm, initial encounter: Secondary | ICD-10-CM | POA: Insufficient documentation

## 2017-10-11 DIAGNOSIS — E559 Vitamin D deficiency, unspecified: Secondary | ICD-10-CM | POA: Insufficient documentation

## 2017-10-11 DIAGNOSIS — Z79899 Other long term (current) drug therapy: Secondary | ICD-10-CM | POA: Insufficient documentation

## 2017-10-11 DIAGNOSIS — K219 Gastro-esophageal reflux disease without esophagitis: Secondary | ICD-10-CM | POA: Insufficient documentation

## 2017-10-11 DIAGNOSIS — Z7984 Long term (current) use of oral hypoglycemic drugs: Secondary | ICD-10-CM | POA: Insufficient documentation

## 2017-10-11 DIAGNOSIS — R252 Cramp and spasm: Secondary | ICD-10-CM | POA: Insufficient documentation

## 2017-10-11 DIAGNOSIS — I10 Essential (primary) hypertension: Secondary | ICD-10-CM | POA: Insufficient documentation

## 2017-10-11 DIAGNOSIS — E118 Type 2 diabetes mellitus with unspecified complications: Secondary | ICD-10-CM | POA: Insufficient documentation

## 2017-10-11 DIAGNOSIS — Z888 Allergy status to other drugs, medicaments and biological substances status: Secondary | ICD-10-CM | POA: Insufficient documentation

## 2017-10-11 DIAGNOSIS — J45909 Unspecified asthma, uncomplicated: Secondary | ICD-10-CM | POA: Insufficient documentation

## 2017-10-11 LAB — GLUCOSE, POCT (MANUAL RESULT ENTRY): POC GLUCOSE: 192 mg/dL — AB (ref 70–99)

## 2017-10-11 LAB — POCT GLYCOSYLATED HEMOGLOBIN (HGB A1C): Hemoglobin A1C: 8.6

## 2017-10-11 MED ORDER — METFORMIN HCL 1000 MG PO TABS: 1000.0000 mg | ORAL_TABLET | Freq: Two times a day (BID) | ORAL | 3 refills | Status: DC

## 2017-10-11 MED ORDER — PANTOPRAZOLE SODIUM 40 MG PO TBEC: 40.0000 mg | DELAYED_RELEASE_TABLET | Freq: Every day | ORAL | 3 refills | Status: DC

## 2017-10-11 MED ORDER — VITAMIN D (ERGOCALCIFEROL) 1.25 MG (50000 UNIT) PO CAPS: 50000.0000 [IU] | ORAL_CAPSULE | ORAL | 0 refills | Status: DC

## 2017-10-11 MED ORDER — METHOCARBAMOL 500 MG PO TABS: 500.0000 mg | ORAL_TABLET | Freq: Four times a day (QID) | ORAL | 0 refills | Status: DC

## 2017-10-11 NOTE — Patient Instructions (Signed)
Calambres y espasmos musculares (Muscle Cramps and Spasms) Los calambres y los espasmos musculares ocurren cuando los msculos se contraen solos. Generalmente mejoran despus de algunos minutos. Los calambres musculares son dolorosos. Suelen ser ms fuertes y durar ms [employer]. Estos ltimos pueden o no ser dolorosos. Pueden durar unos pocos segundos o mucho ms tiempo. CUIDADOS EN EL HOGAR  Beba suficiente lquido para mantener el pis (orina) claro o de color amarillo plido.  [place], elongue y relaje el msculo.  Si se lo indican, aplquese calor en los msculos tensos o contrados con la frecuencia establecida por el mdico. Use la fuente del calor que le recomiende el mdico. ? Coloque una toalla entre la piel y la fuente de Airline pilot. ? Aplique el calor durante 20 a . ? Retire el calor si la piel se le pone de color rojo brillante. Esto es muy importante si no puede sentir el dolor, el calor o el fro. Puede correr un riesgo mayor de sufrir quemaduras.  Si se lo indican, aplique hielo sobre la zona afectada. Esto puede ayudarlo si tiene molestias o dolor despus de un calambre o un espasmo. ? Ponga el hielo en una bolsa plstica. ? Coloque una [employer] piel y la bolsa de hielo. ? Coloque el hielo durante , 2 a 3veces por da.  Tome los medicamentos de venta libre y los recetados solamente como se lo haya indicado el mdico.  Est atento a cualquier [employer] sntomas. SOLICITE AYUDA SI:  Los calambres o los espasmos [place] o son ms frecuentes.  Los calambres o los espasmos no mejoran con el Comeri­o. Esta informacin no tiene Theme park manager el consejo del mdico. Asegrese de hacerle al mdico cualquier pregunta que tenga. Document Released: 12/02/2008 Document Revised: 12/31/2012 Document Reviewed: 06/09/2015 Elsevier Interactive Patient Education  2018 [employer]. Diabetes mellitus y nutricin Diabetes Mellitus and  Nutrition Si sufre de diabetes (diabetes mellitus), es muy importante tener hbitos alimenticios saludables debido a que sus niveles de Psychologist, counselling sangre (glucosa) se ven afectados en gran medida por lo que come y bebe. [place] alimentos saludables en las cantidades [place], aproximadamente a la [employer], [place] ayudar a:  Scientist, physiological glucemia.  Disminuir el riesgo de sufrir una enfermedad cardaca.  Mejorar la presin arterial.  [name] o mantener un peso saludable.  Todas las personas que sufren de diabetes son diferentes y cada una tiene necesidades diferentes en cuanto a un plan de alimentacin. El mdico puede recomendarle que trabaje con un especialista en dietas y nutricin (nutricionista) para Tax adviser plan para usted. Su plan de alimentacin puede variar segn factores como:  Las caloras que necesita.  Los medicamentos que toma.  Su peso.  Sus niveles de glucemia, presin arterial y colesterol.  Su nivel de [name].  Otras afecciones que tenga, como enfermedades cardacas o renales.  Cmo me afectan los carbohidratos? Los carbohidratos afectan el nivel de glucemia ms que cualquier otro tipo de alimento. La ingesta de carbohidratos naturalmente aumenta la cantidad glucosa en la sangre. El recuento de carbohidratos es un mtodo destinado a Midwife un registro de la cantidad de carbohidratos que se ingieren. El recuento de carbohidratos es importante para Pharmacologist la glucemia a un nivel saludable, en especial si utiliza insulina o toma determinados medicamentos por va oral para la diabetes. Es importante saber la cantidad de carbohidratos que se pueden ingerir en cada comida sin correr Surveyor, minerals. Esto es diferente en  cada persona. El nutricionista puede ayudarlo a calcular la cantidad de carbohidratos que debe ingerir en cada comida y colacin. Los alimentos que contienen carbohidratos incluyen:  Pan, cereal, arroz, pasta y galletas.  Papas y  maz.  Guisantes, frijoles y lentejas.  Leche y Dentist.  Nils Pyle y [place].  Postres, como pasteles, galletitas, helado y caramelos.  Cmo me afecta el alcohol? El alcohol puede provocar disminuciones sbitas de la glucemia (hipoglucemia), en especial si utiliza insulina o toma determinados medicamentos por va oral para la diabetes. La hipoglucemia es una afeccin potencialmente mortal. Los sntomas de la hipoglucemia (somnolencia, mareos y confusin) son similares a los sntomas de haber consumido demasiado alcohol. Si el mdico afirma que el alcohol es seguro para usted, siga estas pautas:  Limite el consumo de alcohol a no ms de 1 medida por da si es mujer y no est Orthoptist, y a 2 medidas si es hombre. Una medida equivale a 12oz ( ) de cerveza, 5oz ( ) de vino o 1oz (44ml) de bebidas de alta graduacin alcohlica.  No beba con el estmago vaco.  Mantngase hidratado con agua, gaseosas dietticas o t helado sin azcar.  Tenga en cuenta que las gaseosas comunes, los jugos y otros refrescos pueden contener mucha azcar y se deben contar como carbohidratos.  Consejos para seguir Consulting civil engineer las etiquetas de los alimentos  Comience por controlar el tamao de la porcin en la etiqueta. La cantidad de caloras, carbohidratos, grasas y otros nutrientes mencionados en la etiqueta se basan en una porcin del alimento. Muchos alimentos contienen ms de una porcin por envase.  Verifique la cantidad total de gramos (g) de carbohidratos totales en una porcin. Puede calcular la cantidad de porciones de carbohidratos al dividir el total de carbohidratos por 15. Por ejemplo, si un alimento posee un total de 30g de carbohidratos, equivale a 2 porciones de carbohidratos.  Verifique la cantidad de gramos (g) de grasas saturadas y grasas trans en una porcin. Escoja alimentos que no contengan grasa o que tengan un bajo contenido.  Controle la cantidad de miligramos (mg) de sodio  en una porcin. La [employer] de las personas deben limitar la ingesta de sodio total a menos de 2300mg  por Futures trader.  Siempre consulte la informacin nutricional de los alimentos etiquetados como "con bajo contenido de grasa" o "sin grasa". Estos alimentos pueden ser ms altos en azcar agregada o en carbohidratos refinados y deben evitarse.  Hable con el nutricionista para identificar sus objetivos diarios en cuanto a los nutrientes mencionados en la etiqueta. De compras  Evite comprar alimentos procesados, enlatados o prehechos. Estos alimentos tienden a Civil Service fast streamer cantidad de [place], sodio y azcar agregada.  Compre en la zona exterior de la tienda de comestibles. Esta incluye frutas y Medtronic, granos a granel, carnes frescas y productos lcteos frescos. Coccin  Utilice mtodos de coccin a baja temperatura, como hornear, en lugar de mtodos de coccin a alta temperatura, como frer en abundante aceite.  Cocine con aceites saludables, como el aceite de [place], canola o [place].  Evite cocinar con manteca, crema o carnes con alto contenido de grasa. Planificacin de las comidas  [employer] comidas y las colaciones de forma regular, preferentemente a la misma hora todos [place]. Evite pasar largos perodos de tiempo sin comer.  Consuma alimentos ricos en fibra, como frutas frescas, verduras, frijoles y cereales integrales. Consulte al nutricionista sobre cuntas porciones de carbohidratos puede consumir en cada comida.  Consuma entre 4 y 6 onzas de  protenas [employer], como carnes [place], pollo, pescado, huevos o tofu. 1 onza equivale a 1 onza de carne, pollo o pescado, 1 huevo, o 1/4 taza de tofu.  Coma algunos alimentos por da que contengan grasas saludables, como aguacates, frutos secos, semillas y pescado. Estilo de vida   Controle su nivel de glucemia con regularidad.  Haga ejercicio al menos , 5das o ms por semana, o como se lo haya indicado el  mdico.  Tome los Monsanto Company se lo haya indicado el mdico.  No consuma ningn producto que contenga nicotina o tabaco, como cigarrillos y Administrator, Civil Service. Si necesita ayuda para dejar de fumar, consulte al CIGNA con un asesor o instructor en diabetes para identificar estrategias para controlar el estrs y cualquier desafo emocional y social. Cules son algunas de las preguntas que puedo hacerle a mi mdico?  Es necesario que me rena con IT trainer en diabetes?  Es necesario que me rena con un nutricionista?  A qu nmero puedo llamar si tengo preguntas?  Cules son los mejores momentos para controlar la glucemia? Dnde encontrar ms informacin:  Asociacin Americana de la Diabetes (American Diabetes Association): diabetes.org/food-and-fitness/food  Academia de Nutricin y Pension scheme manager (Academy of Nutrition and Dietetics): https://www.vargas.com/  [name] de la Diabetes y las Enfermedades Digestivas y Special educational needs teacher [facility] of Diabetes and Digestive and Kidney Diseases) (Institutos Nacionales de [place], NIH): FindJewelers.cz Resumen  Un plan de alimentacin saludable lo ayudar a Scientist, physiological glucemia y Pharmacologist un estilo de vida saludable.  Trabajar con un especialista en dietas y nutricin (nutricionista) puede ayudarlo a Designer, television/film set de alimentacin para usted.  Tenga en cuenta que los carbohidratos y el alcohol tienen efectos inmediatos en sus niveles de glucemia. Es importante contar los carbohidratos y consumir alcohol con prudencia. Esta informacin no tiene Theme park manager el consejo del mdico. Asegrese de hacerle al mdico cualquier pregunta que tenga. Document Released: 12/13/2007 Document Revised: 12/26/2016 Document Reviewed: 12/26/2016 Elsevier Interactive Patient Education  2018 [name]. Diabetes mellitus y actividad fsica (Diabetes Mellitus and Exercise) Hacer actividad fsica habitualmente es importante para el estado de salud general, en especial si tiene diabetes (diabetes mellitus). La actividad fsica no solo se reduce a Psychiatric nurse. Aporta muchos beneficios para la salud, como aumentar la fuerza muscular y la densidad sea, y reducir las grasas corporales y Development worker, community. Esto mejora el estado fsico, la flexibilidad y la resistencia, y todo ello redunda en un mejor estado de salud general. La actividad fsica tiene beneficios adicionales para los diabticos, entre ellos:  Disminuye el apetito.  Ayuda a bajar y Photographer glucemia bajo control.  Baja la presin arterial.  Ayuda a controlar las cantidades de sustancias grasas (lpidos) en la [place], como el colesterol y los triglicridos.  Mejora la respuesta del cuerpo a la insulina (optimizacin de la sensibilidad a la insulina).  Reduce la cantidad de insulina que el cuerpo necesita.  Reduce el riesgo de sufrir cardiopata coronaria de la siguiente forma: ? [employer] de colesterol y triglicridos. ? Aumenta los niveles de colesterol bueno. ? Disminuye la glucemia. QU ES EL PLAN DE ACTIVIDADES? El mdico o un educador para la diabetes certificado pueden ayudarlo a Teacher, English as a foreign language del tipo y de la frecuencia de actividad fsica (plan de actividades) adecuado para usted. Asegrese de lo siguiente:  Haga por lo menos semanales de ejercicios de intensidad moderada o vigorosa. Estos podran ser caminatas dinmicas, ciclismo o gimnasia  acutica. ? Haga ejercicios de elongacin y de fortalecimiento, como yoga o levantamiento de pesas, por lo menos 2veces por semana. ? Reparta la actividad en al menos 3das de la semana.  Haga algn tipo de actividad fsica CarMaxtodos los das. ? No deje pasar ms de 2das seguidos sin hacer algn tipo de actividad fsica. ? No permanezca inactivo  durante ms de 90minutos seguidos. Tmese descansos frecuentes para caminar o estirarse.  Elija un tipo de ejercicio o de actividad que disfrute y establezca objetivos realistas.  Comience lentamente y aumente de [place]manera gradual la intensidad del ejercicio con el correr del [place] Hutchinsontiempo. QU DEBO SABER ACERCA DEL CONTROL DE LA DIABETES?  Contrlese la glucemia antes y despus de ejercitarse. ? Si la glucemia supera los 240mg /dl (16,1WRUE/A13,3mmol/l) antes de ejercitarse, hgase un control de la orina para detectar la presencia de cetonas. Si tiene Federal-Mogulcetonas en la orina, no se ejercite hasta que la glucemia se normalice.  Conozca los sntomas de la glucemia baja (hipoglucemia) y aprenda cmo tratarla. El riesgo de tener hipoglucemia [place]oth[place]umenta durante y despus de hacer actividad fsica. Los sntomas frecuentes de hipoglucemia pueden incluir los siguientes: ? Hambre. ? Ansiedad. ? Sudoracin y Alcoa Incpiel hmeda. ? Confusin. ? Mareos o sensacin de desvanecimiento. ? Aumento de la frecuencia cardaca o palpitaciones. ? Visin borrosa. ? Hormigueo o adormecimiento alrededor Public Service Enterprise Groupde la boca, los labios o la [place]lengua. ? Temblores o sacudidas. ? Irritabilidad.  Tenga una colacin de carbohidratos de accin rpida disponible antes, durante y despus de ejercitarse, a fin de evitar o tratar la hipoglucemia.  Evite inyectarse insulina en las zonas del cuerpo que ejercitar. Por ejemplo, evite inyectarse insulina en: ? Los brazos, si juega al tenis. ? Las piernas, si corre.  Lleve registros de sus hbitos de actividad fsica. Esto puede ayudarlos a usted y al mdico a Retail bankeradaptar el plan de control de la diabetes segn sea necesario. Escriba los siguientes datos: ? Los alimentos que consume antes y despus de Radio producerhacer actividad fsica. ? Los niveles de glucosa en la sangre antes y despus de hacer ejercicios. ? El tipo y cantidad de [place] Grenadinesactividad fsica que Biomedical engineerrealiza. ? Cuando se prev que la insulina alcance su valor mximo, si [place]ausa  insulina. No haga actividad fsica en los momentos en que insulina alcanza su valor mximo.  Cuando comience un ejercicio o una actividad nuevos, trabaje con el mdico para asegurarse de que la actividad sea segura para usted y para Academic librarianajustar la [place]villeinsulina, los medicamentos o la ingesta de alimentos segn sea necesario.  Beba gran cantidad de agua mientras hace ejercicios para evitar la deshidratacin o los golpes de Airline pilotcalor. Beba suficiente lquido para Photographermantener la orina clara o de color amarillo plido. Esta informacin no tiene Theme park managercomo fin reemplazar el consejo del mdico. Asegrese de hacerle al mdico cualquier pregunta que tenga. Document Released: 09/25/2007 Document Revised: 12/28/2015 Document Reviewed: 02/15/2016 Elsevier Interactive Patient Education  [employer]2018 Elsevier Inc.

## 2017-10-11 NOTE — Progress Notes (Signed)
Alexandra Aguirre, is a 42 y.o. female  WER:[id number]  [id number]:[id number]  DOB - Feb 11, 1976  Chief Complaint  Patient presents with  . Diabetes  . Arm Pain    bilateral      Subjective:   Alexandra Aguirre is a 42 y.o. female with a history of GERD, hypertension, asthma, seasonal allergies and Type 2 Diabetes who presents here today for a follow up visit and medication refills. She is complaining of muscle cramp on her upper limbs especially the right side. She denies any recent trauma. She claims adherence with her medications, denies any side effects. No hypoglycemic episodes. Patient has No headache, No chest pain, No abdominal pain - No Nausea, No new weakness tingling or numbness, No Cough - SOB.  Problem  Muscle Cramping   ALLERGIES: Allergies  Allergen Reactions  . Fruit & Vegetable Daily [Nutritional Supplements] Other (See Comments)    "mouth rash" from Avocado, apple, peaches, plums, kiwi    PAST MEDICAL HISTORY: Past Medical History:  Diagnosis Date  . Abnormal Pap smear of cervix 09/24/2013   AGUS  . Asthma   . Diabetes mellitus without complication ([place])   . GERD (gastroesophageal reflux disease)     MEDICATIONS AT HOME: Prior to Admission medications   Medication Sig Start Date End Date Taking? Authorizing Provider  busPIRone (BUSPAR) 10 MG tablet Take 1 tablet (10 mg total) by mouth 2 (two) times daily. 06/21/17  Yes Ary Lavine, Marlena Clipper, MD  butalbital-acetaminophen-caffeine (FIORICET, ESGIC) 50-325-40 MG tablet Take 1-2 tablets by mouth every 6 (six) hours as needed for headache. 07/07/17 07/07/18 Yes Domenic Moras, PA-C  cetirizine (ZYRTEC) 10 MG tablet Take 1 tablet (10 mg total) by mouth daily. 02/13/15  Yes Advani, Vernon Prey, MD  fluticasone (FLONASE) 50 MCG/ACT nasal spray Place 2 sprays into both nostrils daily. 06/01/17  Yes Freeman Caldron M, PA-C  metFORMIN (GLUCOPHAGE) 1000 MG tablet Take 1 tablet (1,000 mg total) by mouth 2 (two) times daily with a meal. 10/11/17   Yes Saban Heinlen E, MD  methocarbamol (ROBAXIN) 500 MG tablet Take 1 tablet (500 mg total) by mouth 4 (four) times daily. X 1 week then prn muscle pain 10/11/17  Yes Marialena Wollen, Marlena Clipper, MD  Vitamin D, Ergocalciferol, (DRISDOL) 50000 units CAPS capsule Take 1 capsule (50,000 Units total) by mouth every 7 (seven) days. 10/11/17  Yes Tresa Garter, MD  albuterol (PROVENTIL HFA;VENTOLIN HFA) 108 (90 BASE) MCG/ACT inhaler Inhale 2 puffs into the lungs every 6 (six) hours as needed for wheezing or shortness of breath. Patient not taking: Reported on 08/23/2017 01/14/14   Lind Covert, MD  pantoprazole (PROTONIX) 40 MG tablet Take 1 tablet (40 mg total) by mouth daily. 10/11/17   Tresa Garter, MD    Objective:   Vitals:   10/11/17 1617  BP: 99/72  Pulse: 68  Resp: 18  Temp: 98.6 F (37 C)  TempSrc: Oral  SpO2: 100%  Weight: 195 lb (88.5 kg)  Height: 5' 2"  (1.575 m)   Exam General appearance : Awake, alert, not in any distress. Speech Clear. Not toxic looking, obese HEENT: Atraumatic and Normocephalic, pupils equally reactive to light and accomodation Neck: Supple, no JVD. No cervical lymphadenopathy.  Chest: Good air entry bilaterally, no added sounds  CVS: S1 S2 regular, no murmurs.  Abdomen: Bowel sounds present, Non tender and not distended with no gaurding, rigidity or rebound. Extremities: B/L Lower Ext shows no edema, both legs are warm to touch Neurology: Awake alert, and  oriented X 3, CN II-XII intact, Non focal Skin: No Rash  Data Review Lab Results  Component Value Date   HGBA1C 8.6 10/11/2017   HGBA1C 7.8 06/21/2017   HGBA1C 7.7 03/01/2017    Assessment & Plan   1. Type 2 diabetes mellitus with complication, without long-term current use of insulin (HCC)  - HgB A1c - Glucose (CBG)  - metFORMIN (GLUCOPHAGE) 1000 MG tablet; Take 1 tablet (1,000 mg total) by mouth 2 (two) times daily with a meal.  Dispense: 180 tablet; Refill: 3 -  CMP14+EGFR  2. Muscle cramping  - methocarbamol (ROBAXIN) 500 MG tablet; Take 1 tablet (500 mg total) by mouth 4 (four) times daily. X 1 week then prn muscle pain  Dispense: 90 tablet; Refill: 0 - CMP14+EGFR  3. Gastroesophageal reflux disease, esophagitis presence not specified  - pantoprazole (PROTONIX) 40 MG tablet; Take 1 tablet (40 mg total) by mouth daily.  Dispense: 30 tablet; Refill: 3  4. Muscle strain of right upper arm, initial encounter  - methocarbamol (ROBAXIN) 500 MG tablet; Take 1 tablet (500 mg total) by mouth 4 (four) times daily. X 1 week then prn muscle pain  Dispense: 90 tablet; Refill: 0  5. Vitamin D deficiency  - Vitamin D, Ergocalciferol, (DRISDOL) 50000 units CAPS capsule; Take 1 capsule (50,000 Units total) by mouth every 7 (seven) days.  Dispense: 15 capsule; Refill: 0  Patient have been counseled extensively about nutrition and exercise. Other issues discussed during this visit include: low cholesterol diet, weight control and daily exercise, foot care, annual eye examinations at Ophthalmology, importance of adherence with medications and regular follow-up. We also discussed long term complications of uncontrolled diabetes and hypertension.   Return in about 3 months (around 01/09/2018) for Hemoglobin A1C and Follow up, DM, Follow up HTN, Routine Follow Up.  The patient was given clear instructions to go to ER or return to medical center if symptoms don't improve, worsen or new problems develop. The patient verbalized understanding. The patient was told to call to get lab results if they haven't heard anything in the next week.   This note has been created with Surveyor, quantity. Any transcriptional errors are unintentional.    Angelica Chessman, MD, MHA, Karilyn Cota, [place], [place]   10/11/2017, 5:30 PM

## 2017-10-12 ENCOUNTER — Ambulatory Visit: Payer: Self-pay | Attending: Internal Medicine

## 2017-10-12 DIAGNOSIS — R252 Cramp and spasm: Secondary | ICD-10-CM | POA: Insufficient documentation

## 2017-10-12 DIAGNOSIS — E118 Type 2 diabetes mellitus with unspecified complications: Secondary | ICD-10-CM | POA: Insufficient documentation

## 2017-10-12 NOTE — Progress Notes (Signed)
Patient here for lab visit only 

## 2017-10-13 LAB — CMP14+EGFR
A/G RATIO: 1.3 (ref 1.2–2.2)
ALT: 71 IU/L — AB (ref 0–32)
AST: 59 IU/L — ABNORMAL HIGH (ref 0–40)
Albumin: 4.2 g/dL (ref 3.5–5.5)
Alkaline Phosphatase: 142 IU/L — ABNORMAL HIGH (ref 39–117)
BILIRUBIN TOTAL: 0.3 mg/dL (ref 0.0–1.2)
BUN/Creatinine Ratio: 14 (ref 9–23)
BUN: 8 mg/dL (ref 6–24)
CALCIUM: 9.3 mg/dL (ref 8.7–10.2)
CHLORIDE: 98 mmol/L (ref 96–106)
CO2: 23 mmol/L (ref 20–29)
Creatinine, Ser: 0.57 mg/dL (ref 0.57–1.00)
GFR calc Af Amer: 132 mL/min/{1.73_m2} (ref >59)
GFR, EST NON AFRICAN AMERICAN: 115 mL/min/{1.73_m2} (ref >59)
Globulin, Total: 3.2 g/dL (ref 1.5–4.5)
Glucose: 213 mg/dL — ABNORMAL HIGH (ref 65–99)
POTASSIUM: 4.2 mmol/L (ref 3.5–5.2)
Sodium: 137 mmol/L (ref 134–144)
Total Protein: 7.4 g/dL (ref 6.0–8.5)

## 2017-10-16 ENCOUNTER — Ambulatory Visit: Payer: Self-pay | Attending: Internal Medicine

## 2017-11-01 ENCOUNTER — Telehealth (INDEPENDENT_AMBULATORY_CARE_PROVIDER_SITE_OTHER): Payer: Self-pay | Admitting: *Deleted

## 2017-11-01 NOTE — Telephone Encounter (Signed)
-----   Message from Quentin Angstlugbemiga E Jegede, MD sent at 10/27/2017 12:22 PM EST ----- Lab result showed slightly elevated liver enzymes, not new, could be from fatty liver. Please keep blood sugar under control by taking your medications as prescribed, regular physical exercise at least 3 times a week, 50 minutes each time. We will repeat your liver function and evaluate for other possible causes during your next office visit.

## 2017-11-01 NOTE — Telephone Encounter (Signed)
Medical Assistant used [employer] to contact patient.  Interpreter Name:Dionicia Interpreter #: [id number] Patient verified DOB Patient is aware of liver enzymes being elevated due to a fatty liver. Patient advised to control DM better and increase exercise to 3 times a week. No further questions at this time.

## 2017-11-25 ENCOUNTER — Emergency Department (HOSPITAL_COMMUNITY)
Admission: EM | Admit: 2017-11-25 | Discharge: 2017-11-25 | Disposition: A | Discharge status: Home / Self Care | Payer: Self-pay | Attending: Emergency Medicine | Admitting: Emergency Medicine

## 2017-11-25 ENCOUNTER — Other Ambulatory Visit: Payer: Self-pay

## 2017-11-25 ENCOUNTER — Encounter (HOSPITAL_COMMUNITY): Payer: Self-pay | Admitting: Emergency Medicine

## 2017-11-25 ENCOUNTER — Emergency Department (HOSPITAL_COMMUNITY): Payer: Self-pay

## 2017-11-25 DIAGNOSIS — E119 Type 2 diabetes mellitus without complications: Secondary | ICD-10-CM | POA: Insufficient documentation

## 2017-11-25 DIAGNOSIS — J111 Influenza due to unidentified influenza virus with other respiratory manifestations: Secondary | ICD-10-CM | POA: Insufficient documentation

## 2017-11-25 DIAGNOSIS — J45909 Unspecified asthma, uncomplicated: Secondary | ICD-10-CM | POA: Insufficient documentation

## 2017-11-25 DIAGNOSIS — R6889 Other general symptoms and signs: Secondary | ICD-10-CM

## 2017-11-25 DIAGNOSIS — Z7984 Long term (current) use of oral hypoglycemic drugs: Secondary | ICD-10-CM | POA: Insufficient documentation

## 2017-11-25 DIAGNOSIS — R51 Headache: Secondary | ICD-10-CM | POA: Insufficient documentation

## 2017-11-25 DIAGNOSIS — Z79899 Other long term (current) drug therapy: Secondary | ICD-10-CM | POA: Insufficient documentation

## 2017-11-25 LAB — CBC
HEMATOCRIT: 40.7 % (ref 36.0–46.0)
Hemoglobin: 13.7 g/dL (ref 12.0–15.0)
MCH: 29.4 pg (ref 26.0–34.0)
MCHC: 33.7 g/dL (ref 30.0–36.0)
MCV: 87.3 fL (ref 78.0–100.0)
PLATELETS: 333 10*3/uL (ref 150–400)
RBC: 4.66 MIL/uL (ref 3.87–5.11)
RDW: 13.4 % (ref 11.5–15.5)
WBC: 8.9 10*3/uL (ref 4.0–10.5)

## 2017-11-25 LAB — COMPREHENSIVE METABOLIC PANEL
ALBUMIN: 3.5 g/dL (ref 3.5–5.0)
ALT: 54 U/L (ref 14–54)
ANION GAP: 11 (ref 5–15)
AST: 60 U/L — ABNORMAL HIGH (ref 15–41)
Alkaline Phosphatase: 139 U/L — ABNORMAL HIGH (ref 38–126)
BILIRUBIN TOTAL: 0.9 mg/dL (ref 0.3–1.2)
BUN: 5 mg/dL — ABNORMAL LOW (ref 6–20)
CO2: 21 mmol/L — ABNORMAL LOW (ref 22–32)
Calcium: 8.6 mg/dL — ABNORMAL LOW (ref 8.9–10.3)
Chloride: 99 mmol/L — ABNORMAL LOW (ref 101–111)
Creatinine, Ser: 0.53 mg/dL (ref 0.44–1.00)
GFR calc Af Amer: >60 mL/min (ref >60)
Glucose, Bld: 275 mg/dL — ABNORMAL HIGH (ref 65–99)
Potassium: 4 mmol/L (ref 3.5–5.1)
Sodium: 131 mmol/L — ABNORMAL LOW (ref 135–145)
TOTAL PROTEIN: 7.4 g/dL (ref 6.5–8.1)

## 2017-11-25 MED ORDER — ACETAMINOPHEN 325 MG PO TABS: 650.0000 mg | ORAL_TABLET | Freq: Four times a day (QID) | ORAL | 0 refills | Status: DC | PRN

## 2017-11-25 MED ORDER — IBUPROFEN 600 MG PO TABS: 600.0000 mg | ORAL_TABLET | Freq: Four times a day (QID) | ORAL | 0 refills | Status: DC | PRN

## 2017-11-25 MED ORDER — OSELTAMIVIR PHOSPHATE 75 MG PO CAPS: 75.0000 mg | ORAL_CAPSULE | Freq: Two times a day (BID) | ORAL | 0 refills | Status: DC

## 2017-11-25 MED ORDER — KETOROLAC TROMETHAMINE 15 MG/ML IJ SOLN
15.0000 mg | Freq: Once | INTRAMUSCULAR | Status: AC
  Administered 2017-11-25: 15 mg via INTRAVENOUS
  Filled 2017-11-25: qty 1

## 2017-11-25 MED ORDER — ALBUTEROL SULFATE (2.5 MG/3ML) 0.083% IN NEBU
5.0000 mg | INHALATION_SOLUTION | Freq: Once | RESPIRATORY_TRACT | Status: AC
  Administered 2017-11-25: 5 mg via RESPIRATORY_TRACT
  Filled 2017-11-25: qty 6

## 2017-11-25 MED ORDER — GUAIFENESIN-CODEINE 100-10 MG/5ML PO SYRP: 5.0000 mL | ORAL_SOLUTION | Freq: Three times a day (TID) | ORAL | 0 refills | Status: DC | PRN

## 2017-11-25 MED ORDER — SODIUM CHLORIDE 0.9 % IV BOLUS (SEPSIS)
1000.0000 mL | Freq: Once | INTRAVENOUS | Status: AC
  Administered 2017-11-25: 1000 mL via INTRAVENOUS

## 2017-11-25 MED ORDER — PROCHLORPERAZINE EDISYLATE 5 MG/ML IJ SOLN
10.0000 mg | Freq: Once | INTRAMUSCULAR | Status: AC
  Administered 2017-11-25: 10 mg via INTRAVENOUS
  Filled 2017-11-25: qty 2

## 2017-11-25 MED ORDER — DIPHENHYDRAMINE HCL 50 MG/ML IJ SOLN
25.0000 mg | Freq: Once | INTRAMUSCULAR | Status: AC
  Administered 2017-11-25: 25 mg via INTRAVENOUS
  Filled 2017-11-25: qty 1

## 2017-11-25 MED ORDER — ACETAMINOPHEN 500 MG PO TABS
1000.0000 mg | ORAL_TABLET | Freq: Once | ORAL | Status: AC
Start: 2017-11-25 — End: 2017-11-25
  Administered 2017-11-25: 1000 mg via ORAL
  Filled 2017-11-25: qty 2

## 2017-11-25 NOTE — ED Notes (Signed)
ED Provider at bedside. 

## 2017-11-25 NOTE — ED Provider Notes (Signed)
[facility] EMERGENCY DEPARTMENT Provider Note   CSN: [id number] Arrival date & time: 11/25/17  1411     History   Chief Complaint Chief Complaint  Patient presents with  . Flu-like Symptoms    HPI Alexandra Aguirre is a 42 y.o. female.  HPI  Alexandra Aguirre is a 42 year old female with a history of type 2 diabetes, GERD, asthma who presents to the emergency department for evaluation of fever/chills, headache, bilateral ear pain, sore throat, cough which has been ongoing for 3 days now.  She states that she has been using over-the-counter ibuprofen and Tylenol for her fever with temporary improvement.  States that her headache is bitemporal and extends to bilateral ears.  Her headache is 10/10 in severity and described as throbbing.  She denies hearing loss, otorrhea or tinnitus.  Her sore throat is mild and feels "scratchy" in nature.  She reports that her cough this morning was productive of what appeared to be blood.  States that she also has had shortness of breath and wheezing, albuterol inhaler at home improves symptoms temporarily.  Denies associated chest pain.  She denies history of DVT/PE, recent surgery or immobilization, calf swelling/pain, exogenous estrogen.  Reports that she had an abnormal Pap smear, but denies previous history of cancer.  She denies congestion, neck pain/stiffness, numbness, weakness, visual loss, chest pain, palpitations, abdominal pain, nausea/vomiting, diarrhea, dysuria, urinary frequency.  Past Medical History:  Diagnosis Date  . Abnormal Pap smear of cervix 09/24/2013   AGUS  . Asthma   . Diabetes mellitus without complication ([place])   . GERD (gastroesophageal reflux disease)     Patient Active Problem List   Diagnosis Date Noted  . Muscle cramping 10/11/2017  . Dental caries 06/21/2017  . Vitamin D deficiency 03/01/2017  . Muscle strain of right upper arm 03/01/2017  . Type 2 diabetes mellitus with complication, without long-term current  use of insulin ([place]) 10/12/2016  . Gastroesophageal reflux disease 10/12/2016  . Generalized anxiety disorder 10/12/2016  . IFG (impaired fasting glucose) 02/18/2014  . UTI (urinary tract infection) 01/30/2014  . Vaginal bleeding between periods 01/30/2014  . Dysmenorrhea 01/30/2014  . Menorrhagia 01/30/2014  . Asthma with acute exacerbation 01/15/2014  . Vertigo 11/25/2013  . History of cervical dysplasia 11/01/2013  . Atypical glandular cells of undetermined significance (AGUS) on cervical Pap smear 10/08/2013  . Breast pain, right 10/08/2013  . Pedal edema 09/30/2013  . Migraine headache 09/30/2013  . Unspecified gastritis and gastroduodenitis without mention of hemorrhage 09/30/2013  . Back pain 09/30/2013    Past Surgical History:  Procedure Laterality Date  . CERVICAL BIOPSY  W/ LOOP ELECTRODE EXCISION    . removal of cyst Right 2011    OB History    Gravida Para Term Preterm AB Living   5 5 5     5    SAB TAB Ectopic Multiple Live Births                   Home Medications    Prior to Admission medications   Medication Sig Start Date End Date Taking? Authorizing Provider  albuterol (PROVENTIL HFA;VENTOLIN HFA) 108 (90 BASE) MCG/ACT inhaler Inhale 2 puffs into the lungs every 6 (six) hours as needed for wheezing or shortness of breath. Patient not taking: Reported on 08/23/2017 01/14/14   Lind Covert, MD  busPIRone (BUSPAR) 10 MG tablet Take 1 tablet (10 mg total) by mouth 2 (two) times daily. 06/21/17   Tresa Garter,  MD  butalbital-acetaminophen-caffeine (FIORICET, ESGIC) 50-325-40 MG tablet Take 1-2 tablets by mouth every 6 (six) hours as needed for headache. 07/07/17 07/07/18  Domenic Moras, PA-C  cetirizine (ZYRTEC) 10 MG tablet Take 1 tablet (10 mg total) by mouth daily. 02/13/15   Lorayne Marek, MD  fluticasone (FLONASE) 50 MCG/ACT nasal spray Place 2 sprays into both nostrils daily. 06/01/17   Argentina Donovan, PA-C  metFORMIN (GLUCOPHAGE) 1000 MG  tablet Take 1 tablet (1,000 mg total) by mouth 2 (two) times daily with a meal. 10/11/17   Jegede, Marlena Clipper, MD  methocarbamol (ROBAXIN) 500 MG tablet Take 1 tablet (500 mg total) by mouth 4 (four) times daily. X 1 week then prn muscle pain 10/11/17   Tresa Garter, MD  pantoprazole (PROTONIX) 40 MG tablet Take 1 tablet (40 mg total) by mouth daily. 10/11/17   Tresa Garter, MD  Vitamin D, Ergocalciferol, (DRISDOL) 50000 units CAPS capsule Take 1 capsule (50,000 Units total) by mouth every 7 (seven) days. 10/11/17   Tresa Garter, MD    Family History Family History  Problem Relation Age of Onset  . Cancer Mother        cervical/ovarian  . Diabetes Mother   . Diabetes Maternal Uncle   . Diabetes Brother   . Diabetes Maternal Uncle   . Diabetes Maternal Uncle     Social History Social History   Tobacco Use  . Smoking status: Never Smoker  . Smokeless tobacco: Never Used  Substance Use Topics  . Alcohol use: No  . Drug use: No     Allergies   Fruit & vegetable daily [nutritional supplements]   Review of Systems Review of Systems  Constitutional: Positive for chills and fever.  HENT: Positive for ear pain and sore throat. Negative for congestion, hearing loss and trouble swallowing.   Eyes: Negative for visual disturbance.  Respiratory: Positive for cough (hemoptysis), shortness of breath and wheezing.   Cardiovascular: Negative for chest pain.  Gastrointestinal: Negative for abdominal pain, diarrhea, nausea and vomiting.  Genitourinary: Negative for difficulty urinating, dysuria and frequency.  Musculoskeletal: Positive for myalgias. Negative for neck pain and neck stiffness.  Skin: Negative for rash.  Neurological: Positive for headaches. Negative for dizziness, weakness, light-headedness and numbness.     Physical Exam Updated Vital Signs BP 115/64   Pulse 95   Temp 99.4 F (37.4 C) (Oral)   Resp 16   LMP 11/07/2017 (Within Weeks)   SpO2  99%   Physical Exam  Constitutional: She is oriented to person, place, and time. She appears well-developed and well-nourished. No distress.  HENT:  Head: Normocephalic and atraumatic.  Mouth/Throat: Oropharynx is clear and moist. No oropharyngeal exudate.  Bilateral TMs with good cone of light.  Bilateral ear canals normal.  No mastoid tenderness or erythema.  Mucous membranes moist.  Posterior oropharynx without erythema.  No tonsillar edema or exudate.  Uvula midline.  Airway patent and able to handle oral secretions.  Eyes: Conjunctivae are normal. Pupils are equal, round, and reactive to light. Right eye exhibits no discharge. Left eye exhibits no discharge.  Neck: Normal range of motion. Neck supple. No JVD present. No tracheal deviation present.  Cardiovascular: Intact distal pulses. Exam reveals no friction rub.  No murmur heard. Tachycardic, regular rhythm.  Pulmonary/Chest: Effort normal and breath sounds normal. No stridor. No respiratory distress. She has no wheezes. She has no rales.  No respiratory distress. Speaking in full sentences.  Mild expiratory wheezes in bilateral  lower lung fields.  No rhonchi, rales or stridor.  Abdominal: Soft. Bowel sounds are normal. There is no tenderness.  Musculoskeletal:  Bilateral legs without appreciable swelling or tenderness.  Neurological: She is alert and oriented to person, place, and time. Coordination normal.  Skin: Skin is warm and dry. She is not diaphoretic.  Psychiatric: She has a normal mood and affect. Her behavior is normal.  Nursing note and vitals reviewed.    ED Treatments / Results  Labs (all labs ordered are listed, but only abnormal results are displayed) Labs Reviewed  COMPREHENSIVE METABOLIC PANEL - Abnormal; Notable for the following components:      Result Value   Sodium 131 (*)    Chloride 99 (*)    CO2 21 (*)    Glucose, Bld 275 (*)    BUN 5 (*)    Calcium 8.6 (*)    AST 60 (*)    Alkaline Phosphatase  139 (*)    All other components within normal limits  CBC    EKG  EKG Interpretation None       Radiology Dg Chest 2 View  Result Date: 11/25/2017 CLINICAL DATA:  Cough. Shortness of breath. Fever. Asthma. Nonsmoker. COPD. EXAM: CHEST - 2 VIEW COMPARISON:  06/16/2016 FINDINGS: Convex right thoracic spine curvature is mild. Midline trachea. Normal heart size and mediastinal contours. No pleural effusion or pneumothorax. Clear lungs. IMPRESSION: No acute cardiopulmonary disease. Electronically Signed   By: Abigail Miyamoto M.D.   On: 11/25/2017 15:00    Procedures Procedures (including critical care time)  Medications Ordered in ED Medications  acetaminophen (TYLENOL) tablet 1,000 mg (1,000 mg Oral Given 11/25/17 1732)  albuterol (PROVENTIL) (2.5 MG/3ML) 0.083% nebulizer solution 5 mg (5 mg Nebulization Given 11/25/17 1837)  sodium chloride 0.9 % bolus 1,000 mL (0 mLs Intravenous Stopped 11/25/17 1951)  ketorolac (TORADOL) 15 MG/ML injection 15 mg (15 mg Intravenous Given 11/25/17 1840)  prochlorperazine (COMPAZINE) injection 10 mg (10 mg Intravenous Given 11/25/17 1841)  diphenhydrAMINE (BENADRYL) injection 25 mg (25 mg Intravenous Given 11/25/17 1838)     Initial Impression / Assessment and Plan / ED Course  I have reviewed the triage vital signs and the nursing notes.  Pertinent labs & imaging results that were available during my care of the patient were reviewed by me and considered in my medical decision making (see chart for details).     Patient with symptoms consistent with influenza.  Vitals are stable, low-grade fever.  No signs of dehydration, tolerating PO's.  Lungs with mild wheezing. Patient given an albuterol nebulizer treatment in the ED with subsequent improvement in wheezing and SOB. CXR without acute abnormality. Suspect her hemoptysis this morning was related to bronchitis in the setting of her viral illness. Do not think patient is having a PE at this time given shortness of  breath improved with nebulizer and no history of DVT/PE, no pleuritic CP, no leg swelling or calf tenderness, exogenous estrogen, recent immobilization or surgery and no history of cancer.    Labs reviewed. CMP reveals elevated blood glucose (bg 275, patient known diabetic.) Counseled her to have recheck with primary doctor for further medication management of her diabetes. Otherwise CMP unremarkable (Alk phos 139, appears to be baseline.) CBC within normal.   Patient's headache treated and improved in the department. No nuchal rigidity or concern for meningitis. No neurological deficits or red flag signs to indicate need for imaging, patient agrees at bedside. Discussed the cost versus benefit of Tamiflu  treatment with the patient. Patient is 72hrs since symptoms started, but is an asthmatic so will go ahead and treat. Patient will be discharged with instructions to orally hydrate, rest, and use over-the-counter medications such as anti-inflammatories ibuprofen and Aleve for muscle aches and Tylenol for fever.  Patient will also be given a cough suppressant. Discussed return precautions and patient agrees with above plan. Discussed this patient with Dr. Billy Fischer who also agrees with plan and discharge.   Final Clinical Impressions(s) / ED Diagnoses   Final diagnoses:  Flu-like symptoms    ED Discharge Orders        Ordered    oseltamivir (TAMIFLU) 75 MG capsule  Every 12 hours     11/25/17 1941    acetaminophen (TYLENOL) 325 MG tablet  Every 6 hours PRN     11/25/17 1941    ibuprofen (ADVIL,MOTRIN) 600 MG tablet  Every 6 hours PRN     11/25/17 1941    guaiFENesin-codeine (ROBITUSSIN AC) 100-10 MG/5ML syrup  3 times daily PRN     11/25/17 1943       Glyn Ade, PA-C 11/26/17 1153    Gareth Morgan, MD 11/26/17 1416

## 2017-11-25 NOTE — Discharge Instructions (Signed)
Your blood work and x-ray were reassuring.  Your symptoms are consistent with the flu.  Please take Tamiflu twice a day for the next 5 days.  Get plenty of rest at home and drink lots of fluids.  Take Tylenol every 6 hours for fever and ibuprofen every 6 hours for headache and body aches.  I have written you a prescription for cough, this medicine can make you drowsy so please do not drive or work while taking it.  Schedule an appointment with your regular doctor to recheck your blood sugar because it was high today.  Return to the emergency department if you have worsening trouble breathing, develop chest pain or have any new or concerning symptoms.

## 2017-11-25 NOTE — ED Triage Notes (Signed)
Patient presents to ED for assessment of flu-like symptoms starting 3 days ago, with increasing ear pain, sob (hx of asthma with cough and congestion), headaches, intermittent fevers, body aches, and coughing up sputum with specks of blood.

## 2017-12-04 ENCOUNTER — Other Ambulatory Visit: Payer: Self-pay | Admitting: Obstetrics and Gynecology

## 2017-12-04 DIAGNOSIS — Z1231 Encounter for screening mammogram for malignant neoplasm of breast: Secondary | ICD-10-CM

## 2017-12-21 ENCOUNTER — Ambulatory Visit (HOSPITAL_COMMUNITY): Payer: Self-pay

## 2018-01-03 ENCOUNTER — Encounter: Payer: Self-pay | Admitting: Family Medicine

## 2018-01-03 ENCOUNTER — Other Ambulatory Visit: Payer: Self-pay

## 2018-01-03 ENCOUNTER — Ambulatory Visit: Payer: Medicaid Other | Attending: Family Medicine | Admitting: Family Medicine

## 2018-01-03 VITALS — BP 102/68 | HR 85 | Temp 98.2°F | Ht 62.0 in | Wt 189.6 lb

## 2018-01-03 DIAGNOSIS — Z91018 Allergy to other foods: Secondary | ICD-10-CM | POA: Insufficient documentation

## 2018-01-03 DIAGNOSIS — Z9889 Other specified postprocedural states: Secondary | ICD-10-CM | POA: Insufficient documentation

## 2018-01-03 DIAGNOSIS — Z79891 Long term (current) use of opiate analgesic: Secondary | ICD-10-CM | POA: Insufficient documentation

## 2018-01-03 DIAGNOSIS — E118 Type 2 diabetes mellitus with unspecified complications: Secondary | ICD-10-CM | POA: Insufficient documentation

## 2018-01-03 DIAGNOSIS — J302 Other seasonal allergic rhinitis: Secondary | ICD-10-CM

## 2018-01-03 DIAGNOSIS — Z791 Long term (current) use of non-steroidal anti-inflammatories (NSAID): Secondary | ICD-10-CM | POA: Insufficient documentation

## 2018-01-03 DIAGNOSIS — Z7984 Long term (current) use of oral hypoglycemic drugs: Secondary | ICD-10-CM | POA: Insufficient documentation

## 2018-01-03 DIAGNOSIS — I10 Essential (primary) hypertension: Secondary | ICD-10-CM | POA: Insufficient documentation

## 2018-01-03 DIAGNOSIS — K219 Gastro-esophageal reflux disease without esophagitis: Secondary | ICD-10-CM | POA: Insufficient documentation

## 2018-01-03 DIAGNOSIS — J4531 Mild persistent asthma with (acute) exacerbation: Secondary | ICD-10-CM | POA: Insufficient documentation

## 2018-01-03 DIAGNOSIS — Z79899 Other long term (current) drug therapy: Secondary | ICD-10-CM | POA: Insufficient documentation

## 2018-01-03 DIAGNOSIS — N644 Mastodynia: Secondary | ICD-10-CM

## 2018-01-03 LAB — GLUCOSE, POCT (MANUAL RESULT ENTRY): POC Glucose: 275 mg/dl — AB (ref 70–99)

## 2018-01-03 LAB — POCT GLYCOSYLATED HEMOGLOBIN (HGB A1C): HEMOGLOBIN A1C: 8.9

## 2018-01-03 MED ORDER — MOMETASONE FURO-FORMOTEROL FUM 100-5 MCG/ACT IN AERO: 2.0000 | INHALATION_SPRAY | Freq: Two times a day (BID) | RESPIRATORY_TRACT | 3 refills | Status: DC

## 2018-01-03 MED ORDER — FLUTICASONE-SALMETEROL 250-50 MCG/DOSE IN AEPB: 1.0000 | INHALATION_SPRAY | Freq: Two times a day (BID) | RESPIRATORY_TRACT | 3 refills | Status: DC

## 2018-01-03 MED ORDER — IPRATROPIUM-ALBUTEROL 0.5-2.5 (3) MG/3ML IN SOLN
3.0000 mL | Freq: Once | RESPIRATORY_TRACT | Status: AC
  Administered 2018-01-03: 3 mL via RESPIRATORY_TRACT

## 2018-01-03 MED ORDER — CETIRIZINE HCL 10 MG PO TABS: 10.0000 mg | ORAL_TABLET | Freq: Every day | ORAL | 3 refills | Status: DC

## 2018-01-03 MED ORDER — GLIPIZIDE 5 MG PO TABS: 5.0000 mg | ORAL_TABLET | Freq: Two times a day (BID) | ORAL | 3 refills | Status: DC

## 2018-01-03 MED ORDER — FLUTICASONE PROPIONATE 50 MCG/ACT NA SUSP: 2.0000 | Freq: Every day | NASAL | 3 refills | Status: DC

## 2018-01-03 NOTE — Progress Notes (Signed)
Subjective:  Patient ID: Alexandra Aguirre, female    DOB: 12-Jan-1976  Age: 42 y.o. MRN: [id number]  CC: Establish Care; Diabetes; Hypertension; and Allergies   HPI Alexandra Aguirre is a 42 year old female with a history of type 2 diabetes mellitus (A1c 8.9), hypertension, asthma, seasonal allergies who presents today complaining of left breast pain which has been present for 1 day.  She denies breast lumps and denies history of breast trauma. She was previously scheduled for screening mammogram in 02/2018.  She complains of worsening of her asthma symptoms due to the pollen and is having to use her MDI frequently.  She has no controller medications.  Endorses nasal congestion, rhinorrhea and sinus pressure but no shortness of breath; she has had some wheezing but denies fever. Her med list reveals she should be on Zyrtec and Flonase however she has not been using them. She has been compliant with her diabetic medication but not a diabetic diet. Tolerates her antihypertensive.  Past Medical History:  Diagnosis Date  . Abnormal Pap smear of cervix 09/24/2013   AGUS  . Asthma   . Diabetes mellitus without complication ([facility])   . GERD (gastroesophageal reflux disease)     Past Surgical History:  Procedure Laterality Date  . CERVICAL BIOPSY  W/ LOOP ELECTRODE EXCISION    . removal of cyst Right 2011    Allergies  Allergen Reactions  . Fruit & Vegetable Daily [Nutritional Supplements] Other (See Comments)    "mouth rash" from Avocado, apple, peaches, plums, kiwi     Outpatient Medications Prior to Visit  Medication Sig Dispense Refill  . acetaminophen (TYLENOL) 325 MG tablet Take 2 tablets (650 mg total) by mouth every 6 (six) hours as needed. 30 tablet 0  . busPIRone (BUSPAR) 10 MG tablet Take 1 tablet (10 mg total) by mouth 2 (two) times daily. 60 tablet 3  . butalbital-acetaminophen-caffeine (FIORICET, ESGIC) 50-325-40 MG tablet Take 1-2 tablets by mouth every 6 (six) hours as needed for  headache. 20 tablet 0  . ibuprofen (ADVIL,MOTRIN) 600 MG tablet Take 1 tablet (600 mg total) by mouth every 6 (six) hours as needed. 30 tablet 0  . metFORMIN (GLUCOPHAGE) 1000 MG tablet Take 1 tablet (1,000 mg total) by mouth 2 (two) times daily with a meal. 180 tablet 3  . methocarbamol (ROBAXIN) 500 MG tablet Take 1 tablet (500 mg total) by mouth 4 (four) times daily. X 1 week then prn muscle pain 90 tablet 0  . pantoprazole (PROTONIX) 40 MG tablet Take 1 tablet (40 mg total) by mouth daily. 30 tablet 3  . cetirizine (ZYRTEC) 10 MG tablet Take 1 tablet (10 mg total) by mouth daily. 30 tablet 3  . fluticasone (FLONASE) 50 MCG/ACT nasal spray Place 2 sprays into both nostrils daily. 16 g 6  . albuterol (PROVENTIL HFA;VENTOLIN HFA) 108 (90 BASE) MCG/ACT inhaler Inhale 2 puffs into the lungs every 6 (six) hours as needed for wheezing or shortness of breath. (Patient not taking: Reported on 08/23/2017) 1 Inhaler 3  . guaiFENesin-codeine (ROBITUSSIN AC) 100-10 MG/5ML syrup Take 5 mLs by mouth 3 (three) times daily as needed for cough. (Patient not taking: Reported on 01/03/2018) 120 mL 0  . oseltamivir (TAMIFLU) 75 MG capsule Take 1 capsule (75 mg total) by mouth every 12 (twelve) hours. (Patient not taking: Reported on 01/03/2018) 10 capsule 0  . Vitamin D, Ergocalciferol, (DRISDOL) 50000 units CAPS capsule Take 1 capsule (50,000 Units total) by mouth every 7 (seven) days. (Patient not  taking: Reported on 01/03/2018) 15 capsule 0   No facility-administered medications prior to visit.     ROS Review of Systems  Constitutional: Negative for activity change, appetite change and fatigue.  HENT: Positive for congestion and postnasal drip. Negative for sinus pressure and sore throat.   Eyes: Negative for visual disturbance.  Respiratory: Positive for cough. Negative for chest tightness, shortness of breath and wheezing.   Cardiovascular: Negative for chest pain and palpitations.  Gastrointestinal: Negative  for abdominal distention, abdominal pain and constipation.  Endocrine: Negative for polydipsia.  Genitourinary: Negative for dysuria and frequency.  Musculoskeletal: Negative for arthralgias and back pain.  Skin: Negative for rash.  Neurological: Negative for tremors, light-headedness and numbness.  Hematological: Does not bruise/bleed easily.  Psychiatric/Behavioral: Negative for agitation and behavioral problems.    Objective:  BP 102/68   Pulse 85   Temp 98.2 F (36.8 C) (Oral)   Ht 5\' 2"  (1.575 m)   Wt 189 lb 9.6 oz (86 kg)   SpO2 98%   BMI 34.68 kg/m   BP/Weight 01/03/2018 11/25/2017 10/11/2017  Systolic BP 102 108 99  Diastolic BP 68 56 72  Wt. (Lbs) 189.6 - 195  BMI 34.68 - 35.67      Physical Exam  Constitutional: She is oriented to person, place, and time. She appears well-developed and well-nourished.  Cardiovascular: Normal rate, normal heart sounds and intact distal pulses.  No murmur heard. Pulmonary/Chest: Effort normal. She has wheezes (in all lung fields b/l). She has no rales. She exhibits no tenderness. Right breast exhibits no mass and no tenderness. Left breast exhibits tenderness. Left breast exhibits no mass.  Abdominal: Soft. Bowel sounds are normal. She exhibits no distension and no mass. There is no tenderness.  Musculoskeletal: Normal range of motion.  Neurological: She is alert and oriented to person, place, and time.  Skin: Skin is warm and dry.  Psychiatric: She has a normal mood and affect.      Lab Results  Component Value Date   HGBA1C 8.9 01/03/2018    Assessment & Plan:   1. Type 2 diabetes mellitus with complication, without long-term current use of insulin (HCC) Uncontrolled with A1c of 8.9 Glipizide added to regimen Diabetic diet, lifestyle modifications - POCT glucose (manual entry) - POCT glycosylated hemoglobin (Hb A1C) - glipiZIDE (GLUCOTROL) 5 MG tablet; Take 1 tablet (5 mg total) by mouth 2 (two) times daily before a  meal.  Dispense: 60 tablet; Refill: 3  2. Mastodynia of left breast - MM Digital Diagnostic Bilat; Future - US BREAST COMPLETE UNI LEFT INC AXILLA; Future  3. Mild persistent asthma with exacerbation Current exacerbation triggered by pollen Placed on Zyrtec and Flonase Nebulizer treatment given in clinic Placed on Advair for better control as she is at high risk of exacerbation due to trigger from the pollen. - Fluticasone-Salmeterol (ADVAIR DISKUS) 250-50 MCG/DOSE AEPB; Inhale 1 puff into the lungs 2 (two) times daily.  Dispense: 1 each; Refill: 3 - ipratropium-albuterol (DUONEB) 0.5-2.5 (3) MG/3ML nebulizer solution 3 mL  4. Seasonal allergies - cetirizine (ZYRTEC) 10 MG tablet; Take 1 tablet (10 mg total) by mouth daily.  Dispense: 30 tablet; Refill: 3 - fluticasone (FLONASE) 50 MCG/ACT nasal spray; Place 2 sprays into both nostrils daily.  Dispense: 16 g; Refill: 3   Meds ordered this encounter  Medications  . glipiZIDE (GLUCOTROL) 5 MG tablet    Sig: Take 1 tablet (5 mg total) by mouth 2 (two) times daily before a meal.  Dispense:  60 tablet    Refill:  3  . cetirizine (ZYRTEC) 10 MG tablet    Sig: Take 1 tablet (10 mg total) by mouth daily.    Dispense:  30 tablet    Refill:  3  . fluticasone (FLONASE) 50 MCG/ACT nasal spray    Sig: Place 2 sprays into both nostrils daily.    Dispense:  16 g    Refill:  3  . Fluticasone-Salmeterol (ADVAIR DISKUS) 250-50 MCG/DOSE AEPB    Sig: Inhale 1 puff into the lungs 2 (two) times daily.    Dispense:  1 each    Refill:  3  . ipratropium-albuterol (DUONEB) 0.5-2.5 (3) MG/3ML nebulizer solution 3 mL    Follow-up: Return in about 3 months (around 04/04/2018) for Follow up on diabetes and breast pain.   Hoy RegisterEnobong Zachary Nole MD

## 2018-01-03 NOTE — Progress Notes (Signed)
Patient is having pain in breast.

## 2018-01-03 NOTE — Patient Instructions (Signed)

## 2018-01-09 ENCOUNTER — Ambulatory Visit: Payer: Self-pay | Admitting: Internal Medicine

## 2018-01-10 ENCOUNTER — Ambulatory Visit: Payer: Self-pay | Attending: Family Medicine

## 2018-02-16 ENCOUNTER — Other Ambulatory Visit (HOSPITAL_COMMUNITY): Payer: Self-pay | Admitting: *Deleted

## 2018-02-16 DIAGNOSIS — N644 Mastodynia: Secondary | ICD-10-CM

## 2018-02-26 ENCOUNTER — Ambulatory Visit: Payer: Self-pay

## 2018-03-06 ENCOUNTER — Ambulatory Visit
Admission: RE | Admit: 2018-03-06 | Discharge: 2018-03-06 | Disposition: A | Discharge status: Home / Self Care | Payer: No Typology Code available for payment source | Source: Ambulatory Visit | Attending: Obstetrics and Gynecology | Admitting: Obstetrics and Gynecology

## 2018-03-06 ENCOUNTER — Ambulatory Visit (HOSPITAL_COMMUNITY)
Admission: RE | Admit: 2018-03-06 | Discharge: 2018-03-06 | Disposition: A | Discharge status: Home / Self Care | Payer: Self-pay | Source: Ambulatory Visit | Attending: Obstetrics and Gynecology | Admitting: Obstetrics and Gynecology

## 2018-03-06 ENCOUNTER — Encounter (HOSPITAL_COMMUNITY): Payer: Self-pay

## 2018-03-06 VITALS — BP 108/64 | Wt 189.2 lb

## 2018-03-06 DIAGNOSIS — N644 Mastodynia: Secondary | ICD-10-CM

## 2018-03-06 DIAGNOSIS — Z01419 Encounter for gynecological examination (general) (routine) without abnormal findings: Secondary | ICD-10-CM

## 2018-03-06 NOTE — Progress Notes (Addendum)
Complaints of left breast pain around nipple area for over a month that comes and goes. Patient states feels like a sharp needle. Patient rates the pain at a 8-9 out of 10. No complaints today.   Pap Smear: Pap smear completed today. [facility] and normal with negative HPV. Patients [facility] AG[facility]. Patient had a follow up colposcopy and endometrial biopsy that were benign. 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 A LEEP was completed 12/29/2003 for follow up of abnormal colposcopy. Last Pap three Pap smear results are in Epic.  Physical exam: Breasts Breasts symmetrical. No skin abnormalities bilateral breasts. No nipple retraction bilateral breasts. No nipple discharge bilateral breasts. No lymphadenopathy. No lumps palpated bilateral breasts. Complaints of left lower breast tenderness on exam. [facility]. Appointment scheduled for Tuesday, March 06, 2018 at 1250.        Pelvic/Bimanual   Ext Genitalia No lesions, no swelling and no discharge observed on external genitalia.         Vagina Vagina pink and normal texture. No lesions or discharge observed in vagina.          Cervix Cervix is present. Cervix pink and reddened around os. Cervix is of normal texture and friable. No discharge observed. IUD strings visualized.    Uterus Uterus is present and palpable. Uterus in normal position and normal size.        Adnexae Bilateral ovaries present and palpable. No tenderness on palpation.         Rectovaginal No rectal exam completed today since patient had no rectal complaints. No skin abnormalities observed on exam.    Smoking History: Patient has never smoked.  Patient Navigation: Patient education provided. Access to  services provided for [facility] through [facility]. Spanish interpreter provided.   Breast and Cervical Cancer Risk Assessment: Patient has no family history of breast cancer, known genetic mutations, or radiation treatment to the chest before age 42. Patient has a history of cervical dysplasia. Patient has no history of being immunocompromised or DES exposure in-utero. Patient has a 5-year risk for breast cancer at 0.3% and a lifetime risk at 4.6%.  Used Spanish interpreter [employer]athy Hinshaw from [employer].

## 2018-03-06 NOTE — Patient Instructions (Addendum)
Explained breast self awareness with Raphael GibneyNorilda Diaz. Let patient know if today's Pap smear is normal that her next Pap smear is due in one year due to her recent history of an abnormal Pap smear. [facility]. Appointment scheduled for Tuesday, March 06, 2018 at 1250. Let patient know will follow up with her within the next couple weeks with results of Pap smear by letter or phone. Raphael GibneyNorilda Diaz verbalized understanding.  Kathya Wilz, Kathaleen Maserhristine Poll, RN 12:40 PM

## 2018-03-06 NOTE — Addendum Note (Signed)
Encounter addended by: Priscille HeidelbergBrannock, Christine P, RN on: 03/06/2018 1:05 PM  Actions taken: Sign clinical note

## 2018-03-07 ENCOUNTER — Ambulatory Visit: Payer: Self-pay | Attending: Family Medicine

## 2018-03-07 LAB — CYTOLOGY - PAP
Diagnosis: NEGATIVE
HPV: NOT DETECTED

## 2018-03-09 ENCOUNTER — Inpatient Hospital Stay: Payer: No Typology Code available for payment source

## 2018-03-09 ENCOUNTER — Inpatient Hospital Stay: Payer: No Typology Code available for payment source | Attending: Obstetrics and Gynecology | Admitting: *Deleted

## 2018-03-09 ENCOUNTER — Other Ambulatory Visit (HOSPITAL_COMMUNITY): Payer: Self-pay | Admitting: *Deleted

## 2018-03-09 VITALS — BP 128/76 | Ht 62.0 in | Wt 188.8 lb

## 2018-03-09 DIAGNOSIS — Z0000 Encounter for general adult medical examination without abnormal findings: Secondary | ICD-10-CM

## 2018-03-09 LAB — HEMOGLOBIN A1C
Hgb A1c MFr Bld: 9 % — ABNORMAL HIGH (ref 4.8–5.6)
MEAN PLASMA GLUCOSE: 211.6 mg/dL

## 2018-03-09 LAB — LIPID PANEL
CHOLESTEROL: 191 mg/dL (ref 0–200)
HDL: 31 mg/dL — ABNORMAL LOW (ref >40)
LDL Cholesterol: 122 mg/dL — ABNORMAL HIGH (ref 0–99)
Total CHOL/HDL Ratio: 6.2 RATIO
Triglycerides: 190 mg/dL — ABNORMAL HIGH (ref <150)
VLDL: 38 mg/dL (ref 0–40)

## 2018-03-09 NOTE — Progress Notes (Signed)
[facility] initial screening  Spanish Interpreter-  Alexandra Aguirre  Clinical Measurement:  Height: 62in.   Weight: 188.8lb  Blood Pressure: 130/78 Blood Pressure #2: 128/76  Fasting Labs Drawn Today, will review with patient when they result.  Medical History:  Patient states that she has not been diagnosed with high cholesterol, high blood pressure,  or heart disease.  Patient states she has been diagnosed with diabetes.  Medications:  Patients states she is not taking any medications for high cholesterol, high blood pressure.  She is not taking aspirin daily to prevent heart attack or stroke.  Patient states she is taking medication for diabetes.  Blood pressure, self measurement:  Patients states she does measure blood pressure at home.    Nutrition:  Patient states she eats 1 cup of fruit and 0 cups of vegetables in an average day.  Patient states she does  eat fish regularly, she eats more than half a serving of whole grains daily. She drinks less than 36 ounces of beverages with added sugar weekly.  Patient states she  is not  watching her sodium intake.  She has not had any drinks containing alcohol in the last seven days.    Physical activity:  Patient states that she gets 105 minutes of moderate exercise in a week.  She gets 70 minutes of vigorous exercise per week.    Smoking status:  Patient states she has never smoked and is not around any smokers.    Quality of life:  Patient states that she has had 25 bad physical days out of the last 30 days. In the last 2 weeks, she has had 11 days that she has felt down or depressed. She has had 11 days in the last 2 weeks that she has had little interest or pleasure in doing things.  Navigation:  I will notify patient of lab results.  Patient is aware of 2 more health coaching sessions and a follow up.

## 2018-03-09 NOTE — Addendum Note (Signed)
Addended by: Sherrie MustacheLIVER, SABRENA M on: 03/09/2018 11:45 AM   Modules accepted: Orders

## 2018-03-09 NOTE — Addendum Note (Signed)
Addended by: Sherrie MustacheLIVER, Visente Kirker M on: 03/09/2018 11:46 AM   Modules accepted: Orders

## 2018-03-12 ENCOUNTER — Encounter (HOSPITAL_COMMUNITY): Payer: Self-pay | Admitting: *Deleted

## 2018-03-13 ENCOUNTER — Telehealth (HOSPITAL_COMMUNITY): Payer: Self-pay | Admitting: *Deleted

## 2018-03-13 NOTE — Telephone Encounter (Signed)
Health Coaching 2  Spanish Interpreter- Alexandra RoyalsJulie Aguirre  Labs- cholesterol 191, triglycerides 190,HDL 31,LDL 122, hemoglobin A1C 9.0, and glucose 211.6.  Patient is aware and understands lab results.  Goals- Patient states she is increasing her exercise regimen.  Patient states she will increase consumption of fruit, vegetables and water.  Navigation-  Patient is aware of 1 more health coaching session and a follow up.  Patient states she is a patient of [facility].  Patient has an upcoming appointment and is renewing her orange card.  Patient asked for educational literature which is being mailed.

## 2018-03-13 NOTE — Telephone Encounter (Signed)
Telephoned patient at home number and advised patient of negative pap smear results. HPV was negative. Next pap smear due in one year due to history. Patient voiced understanding. Used interpreter Delorise RoyalsJulie Sowell.

## 2018-03-16 ENCOUNTER — Ambulatory Visit (HOSPITAL_COMMUNITY)
Admission: EM | Admit: 2018-03-16 | Discharge: 2018-03-16 | Disposition: A | Discharge status: Home / Self Care | Payer: Medicaid Other | Attending: Family Medicine | Admitting: Family Medicine

## 2018-03-16 ENCOUNTER — Encounter (HOSPITAL_COMMUNITY): Payer: Self-pay | Admitting: *Deleted

## 2018-03-16 ENCOUNTER — Other Ambulatory Visit: Payer: Self-pay

## 2018-03-16 DIAGNOSIS — L509 Urticaria, unspecified: Secondary | ICD-10-CM

## 2018-03-16 HISTORY — DX: Other allergy status, other than to drugs and biological substances: Z91.09

## 2018-03-16 MED ORDER — METHYLPREDNISOLONE 4 MG PO TBPK: ORAL_TABLET | ORAL | 0 refills | Status: DC

## 2018-03-16 MED ORDER — HYDROXYZINE HCL 25 MG PO TABS: 25.0000 mg | ORAL_TABLET | Freq: Four times a day (QID) | ORAL | 0 refills | Status: DC

## 2018-03-16 NOTE — Discharge Instructions (Signed)
Take hydroxyzine for the itching Take the medrol pak as instructed Return if not improving in a few days

## 2018-03-16 NOTE — ED Triage Notes (Signed)
C/O sporadic, raised, pruritic rash to entire body x 3 days.

## 2018-03-16 NOTE — ED Provider Notes (Signed)
[facility]    CSN: [id number] Arrival date & time: 03/16/18  1504     History   Chief Complaint Chief Complaint  Patient presents with  . Rash    HPI Alexandra Aguirre is a 42 y.o. female.   HPI  Patient states she had a rash for 3 days.  It spreading.  It itches terribly.  No one else in the family has a rash.  No known exposure to any foods, soaps, lotions, powders, products that might cause an allergic reaction.  She is Hispanic and her English is fair.  She has a fluent English speaking son who is interpreting for her.  She is offered an interpreter and refuses.  She is never had a rash like this before.  She took some cetirizine which did not help.  No change in medicine.  No over-the-counter medicines or supplements.  Only allergies no food allergies, and she thinks she is avoided the food she is allergic to.  Past Medical History:  Diagnosis Date  . Abnormal Pap smear of cervix 09/24/2013   AGUS  . Asthma   . Diabetes mellitus without complication (HCC)   . Environmental allergies   . GERD (gastroesophageal reflux disease)     Patient Active Problem List   Diagnosis Date Noted  . Muscle cramping 10/11/2017  . Dental caries 06/21/2017  . Vitamin D deficiency 03/01/2017  . Muscle strain of right upper arm 03/01/2017  . Type 2 diabetes mellitus with complication, without long-term current use of insulin (HCC) 10/12/2016  . Gastroesophageal reflux disease 10/12/2016  . Generalized anxiety disorder 10/12/2016  . IFG (impaired fasting glucose) 02/18/2014  . UTI (urinary tract infection) 01/30/2014  . Vaginal bleeding between periods 01/30/2014  . Dysmenorrhea 01/30/2014  . Menorrhagia 01/30/2014  . Asthma with acute exacerbation 01/15/2014  . Vertigo 11/25/2013  . History of cervical dysplasia 11/01/2013  . Atypical glandular cells of undetermined significance (AGUS) on cervical Pap smear 10/08/2013  . Breast pain, right 10/08/2013  . Pedal edema 09/30/2013    . Migraine headache 09/30/2013  . Unspecified gastritis and gastroduodenitis without mention of hemorrhage 09/30/2013  . Back pain 09/30/2013    Past Surgical History:  Procedure Laterality Date  . CERVICAL BIOPSY  W/ LOOP ELECTRODE EXCISION    . removal of cyst Right 2011    OB History    Gravida  5   Para  5   Term  5   Preterm      AB      Living  5     SAB      TAB      Ectopic      Multiple      Live Births               Home Medications    Prior to Admission medications   Medication Sig Start Date End Date Taking? Authorizing Provider  cetirizine (ZYRTEC) 10 MG tablet Take 1 tablet (10 mg total) by mouth daily. 01/03/18  Yes Hoy Register, MD  glipiZIDE (GLUCOTROL) 5 MG tablet Take 1 tablet (5 mg total) by mouth 2 (two) times daily before a meal. 01/03/18  Yes Newlin, Enobong, MD  metFORMIN (GLUCOPHAGE) 1000 MG tablet Take 1 tablet (1,000 mg total) by mouth 2 (two) times daily with a meal. 10/11/17  Yes Jegede, Olugbemiga E, MD  mometasone-formoterol (DULERA) 100-5 MCG/ACT AERO Inhale 2 puffs into the lungs 2 (two) times daily. 01/03/18  Yes Hoy Register, MD  pantoprazole (PROTONIX) 40 MG tablet Take 1 tablet (40 mg total) by mouth daily. 10/11/17  Yes Jegede, Phylliss Blakeslugbemiga E, MD  busPIRone (BUSPAR) 10 MG tablet Take 1 tablet (10 mg total) by mouth 2 (two) times daily. 06/21/17   Quentin AngstJegede, Olugbemiga E, MD  hydrOXYzine (ATARAX/VISTARIL) 25 MG tablet Take 1 tablet (25 mg total) by mouth every 6 (six) hours. 03/16/18   Eustace MooreNelson, Yvonne Sue, MD  methylPREDNISolone (MEDROL DOSEPAK) 4 MG TBPK tablet tad 03/16/18   Eustace MooreNelson, Yvonne Sue, MD    Family History Family History  Problem Relation Age of Onset  . Cancer Mother        cervical/ovarian  . Diabetes Mother   . Diabetes Maternal Uncle   . Diabetes Brother   . Diabetes Maternal Uncle   . Diabetes Maternal Uncle     Social History Social History   Tobacco Use  . Smoking status: Never Smoker  . Smokeless  tobacco: Never Used  Substance Use Topics  . Alcohol use: No  . Drug use: No     Allergies   Fruit & vegetable daily [nutritional supplements]   Review of Systems Review of Systems  Constitutional: Negative for chills and fever.  HENT: Negative for ear pain and sore throat.   Eyes: Negative for pain and visual disturbance.  Respiratory: Negative for cough and shortness of breath.   Cardiovascular: Negative for chest pain and palpitations.  Gastrointestinal: Negative for abdominal pain and vomiting.  Genitourinary: Negative for dysuria and hematuria.  Musculoskeletal: Negative for arthralgias and back pain.  Skin: Positive for rash. Negative for color change.  Neurological: Negative for seizures and syncope.  All other systems reviewed and are negative.    Physical Exam Triage Vital Signs ED Triage Vitals  Enc Vitals Group     BP 03/16/18 1528 106/73     Pulse Rate 03/16/18 1528 80     Resp 03/16/18 1528 16     Temp 03/16/18 1528 98 F (36.7 C)     Temp Source 03/16/18 1528 Oral     SpO2 03/16/18 1528 100 %     Weight --      Height --      Head Circumference --      Peak Flow --      Pain Score 03/16/18 1529 0     Pain Loc --      Pain Edu? --      Excl. in GC? --    No data found.  Updated Vital Signs BP 106/73   Pulse 80   Temp 98 F (36.7 C) (Oral)   Resp 16   LMP 03/16/2018 (Exact Date)   SpO2 100%   Visual Acuity Right Eye Distance:   Left Eye Distance:   Bilateral Distance:    Right Eye Near:   Left Eye Near:    Bilateral Near:     Physical Exam  Constitutional: She appears well-developed and well-nourished. No distress.  HENT:  Head: Normocephalic and atraumatic.  Mouth/Throat: Oropharynx is clear and moist. No oropharyngeal exudate.  Eyes: Pupils are equal, round, and reactive to light. Conjunctivae are normal.  Neck: Normal range of motion.  Cardiovascular: Normal rate, regular rhythm and normal heart sounds.  Pulmonary/Chest: Effort  normal and breath sounds normal. No respiratory distress. She has no wheezes.  Abdominal: Soft. She exhibits no distension.  Musculoskeletal: Normal range of motion. She exhibits no edema.  Lymphadenopathy:    She has no cervical adenopathy.  Neurological: She is alert.  Skin: Skin is warm and dry.  Urticarial wheals on abdomen, few on both arms, none on back, few on lower abdomen and hips.  None on legs.  Each is  2 to 3 cm across oval to circular in appearance.  Psychiatric: She has a normal mood and affect. Her behavior is normal.     UC Treatments / Results  Labs (all labs ordered are listed, but only abnormal results are displayed) Labs Reviewed - No data to display  EKG None  Radiology No results found.  Procedures Procedures (including critical care time)  Medications Ordered in UC Medications - No data to display  Initial Impression / Assessment and Plan / UC Course  I have reviewed the triage vital signs and the nursing notes.  Pertinent labs & imaging results that were available during my care of the patient were reviewed by me and considered in my medical decision making (see chart for details).     Discussed allergies.  Hives.  Difficulty in determining what causes the allergic reaction. Final Clinical Impressions(s) / UC Diagnoses   Final diagnoses:  Hives     Discharge Instructions     Take hydroxyzine for the itching Take the medrol pak as instructed Return if not improving in a few days   ED Prescriptions    Medication Sig Dispense Auth. Provider   hydrOXYzine (ATARAX/VISTARIL) 25 MG tablet Take 1 tablet (25 mg total) by mouth every 6 (six) hours. 12 tablet Eustace Moore, MD   methylPREDNISolone (MEDROL DOSEPAK) 4 MG TBPK tablet tad 21 tablet Eustace Moore, MD     Controlled Substance Prescriptions  Controlled Substance Registry consulted? No   Eustace Moore, MD 03/16/18 2125

## 2018-03-26 ENCOUNTER — Telehealth (HOSPITAL_COMMUNITY): Payer: Self-pay | Admitting: *Deleted

## 2018-03-26 NOTE — Telephone Encounter (Signed)
Patient called Spanish interpreter [name] with questions about her Pap smear result. Explained to patient with Spanish interpreter [name] that her Pap smear was normal and HPV negative. Let patient know that her next Pap smear is due in one year due to her history of an abnormal Pap smear prior to her last Pap smear. Patient verbalized understanding.

## 2018-03-28 ENCOUNTER — Ambulatory Visit: Payer: No Typology Code available for payment source | Attending: Family Medicine

## 2018-03-30 ENCOUNTER — Ambulatory Visit: Payer: Self-pay

## 2018-04-04 ENCOUNTER — Encounter: Payer: Self-pay | Admitting: Family Medicine

## 2018-04-04 ENCOUNTER — Ambulatory Visit: Payer: Self-pay | Attending: Family Medicine | Admitting: Family Medicine

## 2018-04-04 VITALS — BP 111/72 | HR 76 | Temp 98.2°F | Ht 62.0 in | Wt 189.6 lb

## 2018-04-04 DIAGNOSIS — Z7951 Long term (current) use of inhaled steroids: Secondary | ICD-10-CM | POA: Insufficient documentation

## 2018-04-04 DIAGNOSIS — K219 Gastro-esophageal reflux disease without esophagitis: Secondary | ICD-10-CM | POA: Insufficient documentation

## 2018-04-04 DIAGNOSIS — J4520 Mild intermittent asthma, uncomplicated: Secondary | ICD-10-CM | POA: Insufficient documentation

## 2018-04-04 DIAGNOSIS — N644 Mastodynia: Secondary | ICD-10-CM | POA: Insufficient documentation

## 2018-04-04 DIAGNOSIS — Z79899 Other long term (current) drug therapy: Secondary | ICD-10-CM | POA: Insufficient documentation

## 2018-04-04 DIAGNOSIS — Z7984 Long term (current) use of oral hypoglycemic drugs: Secondary | ICD-10-CM | POA: Insufficient documentation

## 2018-04-04 DIAGNOSIS — E785 Hyperlipidemia, unspecified: Secondary | ICD-10-CM | POA: Insufficient documentation

## 2018-04-04 DIAGNOSIS — E118 Type 2 diabetes mellitus with unspecified complications: Secondary | ICD-10-CM | POA: Insufficient documentation

## 2018-04-04 DIAGNOSIS — R21 Rash and other nonspecific skin eruption: Secondary | ICD-10-CM | POA: Insufficient documentation

## 2018-04-04 DIAGNOSIS — E7800 Pure hypercholesterolemia, unspecified: Secondary | ICD-10-CM | POA: Insufficient documentation

## 2018-04-04 LAB — GLUCOSE, POCT (MANUAL RESULT ENTRY): POC GLUCOSE: 214 mg/dL — AB (ref 70–99)

## 2018-04-04 MED ORDER — GLIPIZIDE 10 MG PO TABS
10.0000 mg | ORAL_TABLET | Freq: Two times a day (BID) | ORAL | 6 refills | Status: DC
Start: 2018-04-04 — End: 2018-07-26

## 2018-04-04 MED ORDER — HYDROCORTISONE 0.5 % EX CREA: 1.0000 "application " | TOPICAL_CREAM | Freq: Two times a day (BID) | CUTANEOUS | 1 refills | Status: DC

## 2018-04-04 MED ORDER — METFORMIN HCL 1000 MG PO TABS: 1000.0000 mg | ORAL_TABLET | Freq: Two times a day (BID) | ORAL | 6 refills | Status: DC

## 2018-04-04 MED ORDER — MOMETASONE FURO-FORMOTEROL FUM 100-5 MCG/ACT IN AERO: 2.0000 | INHALATION_SPRAY | Freq: Two times a day (BID) | RESPIRATORY_TRACT | 6 refills | Status: DC

## 2018-04-04 MED ORDER — ATORVASTATIN CALCIUM 20 MG PO TABS: 20.0000 mg | ORAL_TABLET | Freq: Every day | ORAL | 6 refills | Status: DC

## 2018-04-04 NOTE — Progress Notes (Signed)
Subjective:  Patient ID: Alexandra Aguirre, female    DOB: Aug 30, 1976  Age: 42 y.o. MRN: [id number]  CC: Diabetes and Breast Pain   HPI Alexandra Aguirre is a 42 year old female with history of type 2 diabetes mellitus (A1c 9.0), hyperlipidemia who presents today for follow-up visit.  At her last office visit she had complained of left breast pain for which she underwent bilateral diagnostic mammogram and ultrasound of the left breast.  She does have intermittent left breast pain but does not feel any lump.  Results of imaging as below:  IMPRESSION: 1. Fibrocystic changes identified in the left breast, which may be a source of pain.  2.  No mammographic evidence of malignancy in the bilateral breasts.  RECOMMENDATION: 1. Clinical follow-up recommended for the diffuse left breast pain. Any further workup should be based on clinical grounds.  2.  Screening mammogram in one year.(Code:SM-B-01Y)  She endorses compliance with her diabetic medications and denies numbness in extremities, visual concerns, hypoglycemia. She complains of hives on her anterior abdominal wall for the last 3 weeks for which she was seen at urgent care and prescribed hydroxyzine and prednisone with no much relief.  Also states her son had a similar rash and was given a cream by his pediatrician which relieved his symptoms.  She denies history of allergies to foods, soaps or creams and has no rash in her extremities, her trunk or face.  Lesions are not more itchy at night. Her asthma is doing well and she denies exacerbations.  Past Medical History:  Diagnosis Date  . Abnormal Pap smear of cervix 09/24/2013   AGUS  . Asthma   . Diabetes mellitus without complication (HCC)   . Environmental allergies   . GERD (gastroesophageal reflux disease)     Past Surgical History:  Procedure Laterality Date  . CERVICAL BIOPSY  W/ LOOP ELECTRODE EXCISION    . removal of cyst Right 2011    Allergies  Allergen Reactions  .  Fruit & Vegetable Daily [Nutritional Supplements] Other (See Comments)    "mouth rash" from Avocado, apple, peaches, plums, kiwi     Outpatient Medications Prior to Visit  Medication Sig Dispense Refill  . busPIRone (BUSPAR) 10 MG tablet Take 1 tablet (10 mg total) by mouth 2 (two) times daily. 60 tablet 3  . cetirizine (ZYRTEC) 10 MG tablet Take 1 tablet (10 mg total) by mouth daily. 30 tablet 3  . hydrOXYzine (ATARAX/VISTARIL) 25 MG tablet Take 1 tablet (25 mg total) by mouth every 6 (six) hours. 12 tablet 0  . pantoprazole (PROTONIX) 40 MG tablet Take 1 tablet (40 mg total) by mouth daily. 30 tablet 3  . glipiZIDE (GLUCOTROL) 5 MG tablet Take 1 tablet (5 mg total) by mouth 2 (two) times daily before a meal. 60 tablet 3  . metFORMIN (GLUCOPHAGE) 1000 MG tablet Take 1 tablet (1,000 mg total) by mouth 2 (two) times daily with a meal. 180 tablet 3  . mometasone-formoterol (DULERA) 100-5 MCG/ACT AERO Inhale 2 puffs into the lungs 2 (two) times daily. 13 g 3  . methylPREDNISolone (MEDROL DOSEPAK) 4 MG TBPK tablet tad (Patient not taking: Reported on 04/04/2018) 21 tablet 0   No facility-administered medications prior to visit.     ROS Review of Systems  Constitutional: Negative for activity change, appetite change and fatigue.  HENT: Negative for congestion, sinus pressure and sore throat.   Eyes: Negative for visual disturbance.  Respiratory: Negative for cough, chest tightness, shortness of breath and  wheezing.   Cardiovascular: Negative for chest pain and palpitations.  Gastrointestinal: Negative for abdominal distention, abdominal pain and constipation.  Endocrine: Negative for polydipsia.  Genitourinary: Negative for dysuria and frequency.  Musculoskeletal: Negative for arthralgias and back pain.  Skin: Positive for rash.  Neurological: Negative for tremors, light-headedness and numbness.  Hematological: Does not bruise/bleed easily.  Psychiatric/Behavioral: Negative for agitation  and behavioral problems.    Objective:  BP 111/72   Pulse 76   Temp 98.2 F (36.8 C) (Oral)   Ht 5\' 2"  (1.575 m)   Wt 189 lb 9.6 oz (86 kg)   LMP 03/16/2018 (Exact Date)   SpO2 97%   BMI 34.68 kg/m   BP/Weight 04/04/2018 03/16/2018 03/09/2018  Systolic BP 111 106 128  Diastolic BP 72 73 76  Wt. (Lbs) 189.6 - 188.8  BMI 34.68 - 34.53      Physical Exam  Constitutional: She is oriented to person, place, and time. She appears well-developed and well-nourished.  Cardiovascular: Normal rate, normal heart sounds and intact distal pulses.  No murmur heard. Pulmonary/Chest: Effort normal and breath sounds normal. She has no wheezes. She has no rales. She exhibits no tenderness.  Abdominal: Soft. Bowel sounds are normal. She exhibits no distension and no mass. There is no tenderness.  Musculoskeletal: Normal range of motion.  Neurological: She is alert and oriented to person, place, and time.  Skin:  Erythematous lesions to anterior abdominal wall which is sparsely distributed.  No rash on face, trunk or extremities  Psychiatric: She has a normal mood and affect.    CMP Latest Ref Rng & Units 11/25/2017 10/12/2017 08/08/2017  Glucose 65 - 99 mg/dL 696(E) 952(W) 413(K)  BUN 6 - 20 mg/dL 5(L) 8 7  Creatinine 4.40 - 1.00 mg/dL 1.02 7.25 3.66  Sodium 135 - 145 mmol/L 131(L) 137 131(L)  Potassium 3.5 - 5.1 mmol/L 4.0 4.2 3.5  Chloride 101 - 111 mmol/L 99(L) 98 97(L)  CO2 22 - 32 mmol/L 21(L) 23 22  Calcium 8.9 - 10.3 mg/dL 4.4(I) 9.3 3.4(V)  Total Protein 6.5 - 8.1 g/dL 7.4 7.4 -  Total Bilirubin 0.3 - 1.2 mg/dL 0.9 0.3 -  Alkaline Phos 38 - 126 U/L 139(H) 142(H) -  AST 15 - 41 U/L 60(H) 59(H) -  ALT 14 - 54 U/L 54 71(H) -    Lipid Panel     Component Value Date/Time   CHOL 191 03/09/2018 1147   CHOL 167 03/01/2017 1129   TRIG 190 (H) 03/09/2018 1147   HDL 31 (L) 03/09/2018 1147   HDL 31 (L) 03/01/2017 1129   CHOLHDL 6.2 03/09/2018 1147   VLDL 38 03/09/2018 1147   LDLCALC  122 (H) 03/09/2018 1147   LDLCALC 95 03/01/2017 1129    Lab Results  Component Value Date   HGBA1C 9.0 (H) 03/09/2018    Assessment & Plan:   1. Type 2 diabetes mellitus with complication, without long-term current use of insulin (HCC) Uncontrolled with A1c of 9.0 Glipizide dose increased Counseled on Diabetic diet, my plate method, 425 minutes of moderate intensity exercise/week Keep blood sugar logs with fasting goals of 80-120 mg/dl, random of less than 956 and in the event of sugars less than 60 mg/dl or greater than 387 mg/dl please notify the clinic ASAP. It is recommended that you undergo annual eye exams and annual foot exams. Pneumonia vaccine is recommended. - POCT glucose (manual entry) - glipiZIDE (GLUCOTROL) 10 MG tablet; Take 1 tablet (10 mg total) by  mouth 2 (two) times daily before a meal.  Dispense: 60 tablet; Refill: 6 - metFORMIN (GLUCOPHAGE) 1000 MG tablet; Take 1 tablet (1,000 mg total) by mouth 2 (two) times daily with a meal.  Dispense: 120 tablet; Refill: 6 - Microalbumin/Creatinine Ratio, Urine  2. Pure hypercholesterolemia Initiated statin Low-cholesterol diet, lifestyle modifications  3. Rash Placed on hydrocortisone  4. Mild intermittent asthma without complication Controlled Continue Dulera and MDI   Meds ordered this encounter  Medications  . glipiZIDE (GLUCOTROL) 10 MG tablet    Sig: Take 1 tablet (10 mg total) by mouth 2 (two) times daily before a meal.    Dispense:  60 tablet    Refill:  6    Decrease previous dose  . atorvastatin (LIPITOR) 20 MG tablet    Sig: Take 1 tablet (20 mg total) by mouth daily.    Dispense:  30 tablet    Refill:  6  . mometasone-formoterol (DULERA) 100-5 MCG/ACT AERO    Sig: Inhale 2 puffs into the lungs 2 (two) times daily.    Dispense:  13 g    Refill:  6  . metFORMIN (GLUCOPHAGE) 1000 MG tablet    Sig: Take 1 tablet (1,000 mg total) by mouth 2 (two) times daily with a meal.    Dispense:  120 tablet     Refill:  6  . hydrocortisone cream 0.5 %    Sig: Apply 1 application topically 2 (two) times daily.    Dispense:  56 g    Refill:  1    Follow-up: Return in about 3 months (around 07/05/2018) for follow up of chronic medical conditions.   Hoy RegisterEnobong Sinclair Alligood MD

## 2018-04-04 NOTE — Patient Instructions (Signed)
Diabetes Mellitus and Nutrition When you have diabetes (diabetes mellitus), it is very important to have healthy eating habits because your blood sugar (glucose) levels are greatly affected by what you eat and drink. Eating healthy foods in the appropriate amounts, at about the same times every day, can help you:  Control your blood glucose.  Lower your risk of heart disease.  Improve your blood pressure.  Reach or maintain a healthy weight.  Every person with diabetes is different, and each person has different needs for a meal plan. Your health care provider may recommend that you work with a diet and nutrition specialist (dietitian) to make a meal plan that is best for you. Your meal plan may vary depending on factors such as:  The calories you need.  The medicines you take.  Your weight.  Your blood glucose, blood pressure, and cholesterol levels.  Your activity level.  Other health conditions you have, such as heart or kidney disease.  How do carbohydrates affect me? Carbohydrates affect your blood glucose level more than any other type of food. Eating carbohydrates naturally increases the amount of glucose in your blood. Carbohydrate counting is a method for keeping track of how many carbohydrates you eat. Counting carbohydrates is important to keep your blood glucose at a healthy level, especially if you use insulin or take certain oral diabetes medicines. It is important to know how many carbohydrates you can safely have in each meal. This is different for every person. Your dietitian can help you calculate how many carbohydrates you should have at each meal and for snack. Foods that contain carbohydrates include:  Bread, cereal, rice, pasta, and crackers.  Potatoes and corn.  Peas, beans, and lentils.  Milk and yogurt.  Fruit and juice.  Desserts, such as cakes, cookies, ice cream, and candy.  How does alcohol affect me? Alcohol can cause a sudden decrease in blood  glucose (hypoglycemia), especially if you use insulin or take certain oral diabetes medicines. Hypoglycemia can be a life-threatening condition. Symptoms of hypoglycemia (sleepiness, dizziness, and confusion) are similar to symptoms of having too much alcohol. If your health care provider says that alcohol is safe for you, follow these guidelines:  Limit alcohol intake to no more than 1 drink per day for nonpregnant women and 2 drinks per day for men. One drink equals 12 oz of beer, 5 oz of wine, or 1 oz of hard liquor.  Do not drink on an empty stomach.  Keep yourself hydrated with water, diet soda, or unsweetened iced tea.  Keep in mind that regular soda, juice, and other mixers may contain a lot of sugar and must be counted as carbohydrates.  What are tips for following this plan? Reading food labels  Start by checking the serving size on the label. The amount of calories, carbohydrates, fats, and other nutrients listed on the label are based on one serving of the food. Many foods contain more than one serving per package.  Check the total grams (g) of carbohydrates in one serving. You can calculate the number of servings of carbohydrates in one serving by dividing the total carbohydrates by 15. For example, if a food has 30 g of total carbohydrates, it would be equal to 2 servings of carbohydrates.  Check the number of grams (g) of saturated and trans fats in one serving. Choose foods that have low or no amount of these fats.  Check the number of milligrams (mg) of sodium in one serving. Most people   should limit total sodium intake to less than 2,300 mg per day.  Always check the nutrition information of foods labeled as "low-fat" or "nonfat". These foods may be higher in added sugar or refined carbohydrates and should be avoided.  Talk to your dietitian to identify your daily goals for nutrients listed on the label. Shopping  Avoid buying canned, premade, or processed foods. These  foods tend to be high in fat, sodium, and added sugar.  Shop around the outside edge of the grocery store. This includes fresh fruits and vegetables, bulk grains, fresh meats, and fresh dairy. Cooking  Use low-heat cooking methods, such as baking, instead of high-heat cooking methods like deep frying.  Cook using healthy oils, such as olive, canola, or sunflower oil.  Avoid cooking with butter, cream, or high-fat meats. Meal planning  Eat meals and snacks regularly, preferably at the same times every day. Avoid going long periods of time without eating.  Eat foods high in fiber, such as fresh fruits, vegetables, beans, and whole grains. Talk to your dietitian about how many servings of carbohydrates you can eat at each meal.  Eat 4-6 ounces of lean protein each day, such as lean meat, chicken, fish, eggs, or tofu. 1 ounce is equal to 1 ounce of meat, chicken, or fish, 1 egg, or 1/4 cup of tofu.  Eat some foods each day that contain healthy fats, such as avocado, nuts, seeds, and fish. Lifestyle   Check your blood glucose regularly.  Exercise at least 30 minutes 5 or more days each week, or as told by your health care provider.  Take medicines as told by your health care provider.  Do not use any products that contain nicotine or tobacco, such as cigarettes and e-cigarettes. If you need help quitting, ask your health care provider.  Work with a counselor or diabetes educator to identify strategies to manage stress and any emotional and social challenges. What are some questions to ask my health care provider?  Do I need to meet with a diabetes educator?  Do I need to meet with a dietitian?  What number can I call if I have questions?  When are the best times to check my blood glucose? Where to find more information:  American Diabetes Association: diabetes.org/food-and-fitness/food  Academy of Nutrition and Dietetics:  www.eatright.org/resources/health/diseases-and-conditions/diabetes  National Institute of Diabetes and Digestive and Kidney Diseases (NIH): www.niddk.nih.gov/health-information/diabetes/overview/diet-eating-physical-activity Summary  A healthy meal plan will help you control your blood glucose and maintain a healthy lifestyle.  Working with a diet and nutrition specialist (dietitian) can help you make a meal plan that is best for you.  Keep in mind that carbohydrates and alcohol have immediate effects on your blood glucose levels. It is important to count carbohydrates and to use alcohol carefully. This information is not intended to replace advice given to you by your health care provider. Make sure you discuss any questions you have with your health care provider. Document Released: 06/02/2005 Document Revised: 10/10/2016 Document Reviewed: 10/10/2016 Elsevier Interactive Patient Education  2018 Elsevier Inc.  

## 2018-05-14 ENCOUNTER — Telehealth (HOSPITAL_COMMUNITY): Payer: Self-pay | Admitting: *Deleted

## 2018-05-14 NOTE — Telephone Encounter (Signed)
Health coaching 3  Spanish interpreter - Ruta HindsSonia Freeman  Goals- Patient states she has increased fruit and vegetable consumption considerably. I encouraged her to eat at least 5 total fruits and vegetables.  Patient states she has stopped eating fried foods.  I encouraged her to limit processed snack foods.  Patient states she walks 30 minutes daily at least 5 days per week.  I encouraged patient to increase her fiber intake.  Navigation:  Patient is aware of a follow up.  Time- 10 minutes

## 2018-05-31 ENCOUNTER — Encounter (HOSPITAL_COMMUNITY): Payer: Self-pay | Admitting: Emergency Medicine

## 2018-05-31 ENCOUNTER — Other Ambulatory Visit: Payer: Self-pay

## 2018-05-31 ENCOUNTER — Emergency Department (HOSPITAL_COMMUNITY)
Admission: EM | Admit: 2018-05-31 | Discharge: 2018-06-01 | Disposition: A | Discharge status: Home / Self Care | Payer: Self-pay | Attending: Emergency Medicine | Admitting: Emergency Medicine

## 2018-05-31 DIAGNOSIS — E119 Type 2 diabetes mellitus without complications: Secondary | ICD-10-CM | POA: Insufficient documentation

## 2018-05-31 DIAGNOSIS — R109 Unspecified abdominal pain: Secondary | ICD-10-CM | POA: Insufficient documentation

## 2018-05-31 DIAGNOSIS — M79601 Pain in right arm: Secondary | ICD-10-CM | POA: Insufficient documentation

## 2018-05-31 DIAGNOSIS — J45909 Unspecified asthma, uncomplicated: Secondary | ICD-10-CM | POA: Insufficient documentation

## 2018-05-31 DIAGNOSIS — Z79899 Other long term (current) drug therapy: Secondary | ICD-10-CM | POA: Insufficient documentation

## 2018-05-31 DIAGNOSIS — Z7984 Long term (current) use of oral hypoglycemic drugs: Secondary | ICD-10-CM | POA: Insufficient documentation

## 2018-05-31 LAB — URINALYSIS, ROUTINE W REFLEX MICROSCOPIC
Bacteria, UA: NONE SEEN
Bilirubin Urine: NEGATIVE
Glucose, UA: NEGATIVE mg/dL
Ketones, ur: NEGATIVE mg/dL
Leukocytes, UA: NEGATIVE
Nitrite: NEGATIVE
Protein, ur: NEGATIVE mg/dL
Specific Gravity, Urine: 1.015 (ref 1.005–1.030)
pH: 5 (ref 5.0–8.0)

## 2018-05-31 LAB — CBC WITH DIFFERENTIAL/PLATELET
Abs Immature Granulocytes: 0 10*3/uL (ref 0.0–0.1)
Basophils Absolute: 0.1 10*3/uL (ref 0.0–0.1)
Basophils Relative: 1 %
Eosinophils Absolute: 0.5 10*3/uL (ref 0.0–0.7)
Eosinophils Relative: 5 %
HCT: 41.5 % (ref 36.0–46.0)
Hemoglobin: 13.3 g/dL (ref 12.0–15.0)
Immature Granulocytes: 0 %
Lymphocytes Relative: 30 %
Lymphs Abs: 2.9 10*3/uL (ref 0.7–4.0)
MCH: 28.6 pg (ref 26.0–34.0)
MCHC: 32 g/dL (ref 30.0–36.0)
MCV: 89.2 fL (ref 78.0–100.0)
Monocytes Absolute: 0.8 10*3/uL (ref 0.1–1.0)
Monocytes Relative: 9 %
Neutro Abs: 5.4 10*3/uL (ref 1.7–7.7)
Neutrophils Relative %: 55 %
Platelets: 362 10*3/uL (ref 150–400)
RBC: 4.65 MIL/uL (ref 3.87–5.11)
RDW: 13.2 % (ref 11.5–15.5)
WBC: 9.7 10*3/uL (ref 4.0–10.5)

## 2018-05-31 LAB — I-STAT BETA HCG BLOOD, ED (MC, WL, AP ONLY): I-stat hCG, quantitative: <5 m[IU]/mL (ref <5)

## 2018-05-31 LAB — COMPREHENSIVE METABOLIC PANEL
ALT: 34 U/L (ref 0–44)
AST: 30 U/L (ref 15–41)
Albumin: 3.7 g/dL (ref 3.5–5.0)
Alkaline Phosphatase: 130 U/L — ABNORMAL HIGH (ref 38–126)
Anion gap: 12 (ref 5–15)
BUN: 7 mg/dL (ref 6–20)
CO2: 22 mmol/L (ref 22–32)
Calcium: 9.2 mg/dL (ref 8.9–10.3)
Chloride: 102 mmol/L (ref 98–111)
Creatinine, Ser: 0.55 mg/dL (ref 0.44–1.00)
GFR calc Af Amer: >60 mL/min (ref >60)
GFR calc non Af Amer: >60 mL/min (ref >60)
Glucose, Bld: 194 mg/dL — ABNORMAL HIGH (ref 70–99)
Potassium: 3.5 mmol/L (ref 3.5–5.1)
Sodium: 136 mmol/L (ref 135–145)
Total Bilirubin: 0.5 mg/dL (ref 0.3–1.2)
Total Protein: 7.6 g/dL (ref 6.5–8.1)

## 2018-05-31 MED ORDER — TRAMADOL HCL 50 MG PO TABS
50.0000 mg | ORAL_TABLET | Freq: Once | ORAL | Status: AC
  Administered 2018-05-31: 50 mg via ORAL
  Filled 2018-05-31: qty 1

## 2018-05-31 NOTE — ED Provider Notes (Signed)
[facility] EMERGENCY DEPARTMENT Provider Note   CSN: [id number] Arrival date & time: 05/31/18  1804     History   Chief Complaint Chief Complaint  Patient presents with  . Back Pain  . Arm Pain    HPI Alexandra Aguirre is a 42 y.o. female.  The history is provided by the patient and a significant other. The history is limited by a language barrier. No language interpreter was used.  Back Pain    Arm Pain      42 year old female with history of diabetes, asthma, GERD presenting for evaluation of multiple complaints.  Patient is a Spanish-speaking, history obtained through family member who is bedside.  For the past 2 days patient has had persistent pain to her right flank.  Pain is described as an achy throbbing sensation worsening with movement, sitting, bending and moderate intensity.  Pain is not associate with eating or urinating.  There is no blood in the urine.  No associated fever or chills.  No recent injury.  She tries taking ibuprofen at home without relief.  She also complaining of pain in her right arm.  Described as a tingling numbness sensation, waxing and waning for nearly a week.  Pain is not associate with flank pain.  She is right-hand dominant.  No associated chest pain or shortness of breath, nonproductive cough.  No headache or neck pain.  Past Medical History:  Diagnosis Date  . Abnormal Pap smear of cervix 09/24/2013   AGUS  . Asthma   . Diabetes mellitus without complication (HCC)   . Environmental allergies   . GERD (gastroesophageal reflux disease)     Patient Active Problem List   Diagnosis Date Noted  . Hyperlipidemia 04/04/2018  . Muscle cramping 10/11/2017  . Dental caries 06/21/2017  . Vitamin D deficiency 03/01/2017  . Muscle strain of right upper arm 03/01/2017  . Type 2 diabetes mellitus with complication, without long-term current use of insulin (HCC) 10/12/2016  . Gastroesophageal reflux disease 10/12/2016  . Generalized  anxiety disorder 10/12/2016  . UTI (urinary tract infection) 01/30/2014  . Vaginal bleeding between periods 01/30/2014  . Dysmenorrhea 01/30/2014  . Menorrhagia 01/30/2014  . Asthma 01/15/2014  . Vertigo 11/25/2013  . History of cervical dysplasia 11/01/2013  . Atypical glandular cells of undetermined significance (AGUS) on cervical Pap smear 10/08/2013  . Breast pain, right 10/08/2013  . Pedal edema 09/30/2013  . Migraine headache 09/30/2013  . Unspecified gastritis and gastroduodenitis without mention of hemorrhage 09/30/2013  . Back pain 09/30/2013    Past Surgical History:  Procedure Laterality Date  . CERVICAL BIOPSY  W/ LOOP ELECTRODE EXCISION    . removal of cyst Right 2011     OB History    Gravida  5   Para  5   Term  5   Preterm      AB      Living  5     SAB      TAB      Ectopic      Multiple      Live Births               Home Medications    Prior to Admission medications   Medication Sig Start Date End Date Taking? Authorizing Provider  atorvastatin (LIPITOR) 20 MG tablet Take 1 tablet (20 mg total) by mouth daily. 04/04/18   Hoy Register, MD  busPIRone (BUSPAR) 10 MG tablet Take 1 tablet (10 mg total) by  mouth 2 (two) times daily. 06/21/17   Quentin Angst, MD  cetirizine (ZYRTEC) 10 MG tablet Take 1 tablet (10 mg total) by mouth daily. 01/03/18   Hoy Register, MD  glipiZIDE (GLUCOTROL) 10 MG tablet Take 1 tablet (10 mg total) by mouth 2 (two) times daily before a meal. 04/04/18   Hoy Register, MD  hydrocortisone cream 0.5 % Apply 1 application topically 2 (two) times daily. 04/04/18   Hoy Register, MD  hydrOXYzine (ATARAX/VISTARIL) 25 MG tablet Take 1 tablet (25 mg total) by mouth every 6 (six) hours. 03/16/18   Eustace Moore, MD  metFORMIN (GLUCOPHAGE) 1000 MG tablet Take 1 tablet (1,000 mg total) by mouth 2 (two) times daily with a meal. 04/04/18   Hoy Register, MD  methylPREDNISolone (MEDROL DOSEPAK) 4 MG TBPK tablet  tad Patient not taking: Reported on 04/04/2018 03/16/18   Eustace Moore, MD  mometasone-formoterol [employer]) 100-5 MCG/ACT AERO Inhale 2 puffs into the lungs 2 (two) times daily. 04/04/18   Hoy Register, MD  pantoprazole (PROTONIX) 40 MG tablet Take 1 tablet (40 mg total) by mouth daily. 10/11/17   Quentin Angst, MD    Family History Family History  Problem Relation Age of Onset  . Cancer Mother        cervical/ovarian  . Diabetes Mother   . Diabetes Maternal Uncle   . Diabetes Brother   . Diabetes Maternal Uncle   . Diabetes Maternal Uncle     Social History Social History   Tobacco Use  . Smoking status: Never Smoker  . Smokeless tobacco: Never Used  Substance Use Topics  . Alcohol use: No  . Drug use: No     Allergies   Fruit & vegetable daily [nutritional supplements]   Review of Systems Review of Systems  Musculoskeletal: Positive for back pain.  All other systems reviewed and are negative.    Physical Exam Updated Vital Signs BP 128/76   Pulse 72   Temp 98.4 F (36.9 C) (Oral)   Resp 17   SpO2 100%   Physical Exam  Constitutional: She appears well-developed and well-nourished. No distress.  Obese female well-appearing in no acute discomfort.  HENT:  Head: Atraumatic.  Eyes: Conjunctivae are normal.  Neck: Neck supple.  Cardiovascular: Normal rate, regular rhythm and intact distal pulses.  Pulmonary/Chest: Effort normal and breath sounds normal. No stridor. No respiratory distress. She has no wheezes. She has no rales.  Abdominal: Soft. Bowel sounds are normal. She exhibits no distension. There is no tenderness.  Tenderness to right paralumbar spinal muscle and right CVA region on palpation percussion.  No overlying skin changes.  Musculoskeletal: She exhibits tenderness (Right arm: Tenderness to right deltoid and tricep.  Normal grip strength bilaterally, sensation intact throughout right arm).  Neurological: She is alert.  Skin: No rash  noted.  Psychiatric: She has a normal mood and affect.  Nursing note and vitals reviewed.    ED Treatments / Results  Labs (all labs ordered are listed, but only abnormal results are displayed) Labs Reviewed  COMPREHENSIVE METABOLIC PANEL - Abnormal; Notable for the following components:      Result Value   Glucose, Bld 194 (*)    Alkaline Phosphatase 130 (*)    All other components within normal limits  URINALYSIS, ROUTINE W REFLEX MICROSCOPIC - Abnormal; Notable for the following components:   Hgb urine dipstick SMALL (*)    All other components within normal limits  CBC WITH DIFFERENTIAL/PLATELET  I-STAT BETA  HCG BLOOD, ED ([facility], [facility], [facility] ONLY)    EKG None  Radiology Ct Renal Stone Study  Result Date: 06/01/2018 CLINICAL DATA:  Flank pain. Patient reports low back pain for 2 days. EXAM: CT ABDOMEN AND PELVIS WITHOUT CONTRAST TECHNIQUE: Multidetector CT imaging of the abdomen and pelvis was performed following the standard protocol without IV contrast. COMPARISON:  Contrast minute CT 08/16/2017 FINDINGS: Lower chest: Scattered atelectasis. Hepatobiliary: Enlarged liver measuring 23 cm cranial caudal with diffuse hepatic steatosis. Gallbladder is nondistended. No calcified gallstone or pericholecystic inflammation. Pancreas: No ductal dilatation or inflammation. Spleen: Normal in size without focal abnormality. Adrenals/Urinary Tract: Normal adrenal glands. No hydronephrosis or perinephric edema. No urolithiasis. Urinary bladder is partially distended without stone. Stomach/Bowel: Stomach is within normal limits. Appendix appears normal. No evidence of bowel wall thickening, distention, or inflammatory changes. Vascular/Lymphatic: Normal caliber abdominopelvic vasculature. No abdominopelvic adenopathy. Reproductive: IUD in the uterus.  No adnexal mass. Other: No free air, free fluid, or intra-abdominal fluid collection. Small fat containing umbilical hernia. Musculoskeletal: There are no  acute or suspicious osseous abnormalities. IMPRESSION: 1. No acute findings.  No renal stone or obstructive uropathy. 2. Hepatomegaly and hepatic steatosis. Electronically Signed   By: Narda RutherfordMelanie  Sanford M.D.   On: 06/01/2018 00:32    Procedures Procedures (including critical care time)  Medications Ordered in ED Medications  traMADol (ULTRAM) tablet 50 mg (50 mg Oral Given 05/31/18 2357)     Initial Impression / Assessment and Plan / ED Course  I have reviewed the triage vital signs and the nursing notes.  Pertinent labs & imaging results that were available during my care of the patient were reviewed by me and considered in my medical decision making (see chart for details).     BP 128/76   Pulse 72   Temp 98.4 F (36.9 C) (Oral)   Resp 17   SpO2 100%    Final Clinical Impressions(s) / ED Diagnoses   Final diagnoses:  Right arm pain  Right flank pain    ED Discharge Orders         Ordered    cyclobenzaprine (FLEXERIL) 10 MG tablet  2 times daily PRN     06/01/18 0053    traMADol (ULTRAM) 50 MG tablet  Every 6 hours PRN     06/01/18 0053         10:49 PM Patient with pain to her right flank and right lower back.  This pain is reproducible.  Pain is not likely to be associated with ACS, dissection, or acute emergent condition.  Her urine shows small amounts of hemoglobin and urine dipsticks however I have low suspicion for kidney stone.  I suspect this is musculoskeletal pain.  Her pregnancy test is negative.  Labs are reassuring.  Patient complaining of radicular right arm pain.  Pain is reproducible without any associated weakness  11:32 PM Patient still endorse pain, will obtain CT renal stone study.  12:49 AM Abdominal pelvis CT scan shows no acute finding.  No evidence of kidney stones or obstructive uropathy.  Evidence of enlarged liver with hepatic steatosis however her liver function panel is fairly unremarkable.  At this time I encourage patient to follow-up  with PCP for further care.  Return precautions discussed.   Fayrene Helperran, Dayvon Dax, PA-C 06/01/18 [id number]    Shaune PollackIsaacs, [place], MD 06/03/18 [id number]

## 2018-05-31 NOTE — ED Triage Notes (Signed)
Patient presents with multiple complaints : low back pain for 2 days , right arm pain , headache , fatigue and generalized weakness . Denies fever or chills .

## 2018-06-01 ENCOUNTER — Emergency Department (HOSPITAL_COMMUNITY): Payer: Self-pay

## 2018-06-01 MED ORDER — TRAMADOL HCL 50 MG PO TABS: 50.0000 mg | ORAL_TABLET | Freq: Four times a day (QID) | ORAL | 0 refills | Status: DC | PRN

## 2018-06-01 MED ORDER — CYCLOBENZAPRINE HCL 10 MG PO TABS: 10.0000 mg | ORAL_TABLET | Freq: Two times a day (BID) | ORAL | 0 refills | Status: DC | PRN

## 2018-06-01 NOTE — Discharge Instructions (Addendum)
You have been evaluated for your condition.  Your CT scan did not show any worrisome changes to your kidney.  No kidney stone.  Your liver is enlarge, please discuss this with your doctor.  Take flexeril and tramadol for your arm pain and flank pain. Return if you have any concerns.

## 2018-06-13 NOTE — Progress Notes (Signed)
Patient ID: Alexandra Aguirre, female   DOB: 1976-05-22, 42 y.o.   MRN: [id number]      Alexandra Aguirre, is a 42 y.o. female  OZH:[id number]  GEX:[id number]  DOB - [birth year]  Subjective:  Chief Complaint and HPI: Alexandra Aguirre is a 42 y.o. female here today to for a follow up visit After being seen in the ED 05/31/2018 for R flank pain and arm pain.  Elevated glucose and slightly elevated alkaline phosphatase.  Otherwise the CMP, CBC, urine preg and UA were essentially normal.  Imaging revealed fatty liver.  Gallbladder was normal.    Has occasional RUQ pain.  Admits to poor diet and partial compliance with diabetes meds(take about 80% of the time).  No f/c.    R arm still hurting.  Flexeril not helping.  No further CP.    Also having vaginal itching that feels like a yeast infection.   "Daphine Deutscher" with [employer] translating.    From ED A/P: 10:49 PM Patient with pain to her right flank and right lower back.  This pain is reproducible.  Pain is not likely to be associated with ACS, dissection, or acute emergent condition.  Her urine shows small amounts of hemoglobin and urine dipsticks however I have low suspicion for kidney stone.  I suspect this is musculoskeletal pain.  Her pregnancy test is negative.  Labs are reassuring.  Patient complaining of radicular right arm pain.  Pain is reproducible without any associated weakness  11:32 PM Patient still endorse pain, will obtain CT renal stone study.  12:49 AM Abdominal pelvis CT scan shows no acute finding.  No evidence of kidney stones or obstructive uropathy.  Evidence of enlarged liver with hepatic steatosis however her liver function panel is fairly unremarkable.  At this time I encourage patient to follow-up with PCP for further care.  Return precautions discussed.   ED/Hospital notes reviewed.    Family history:  DM, htn  ROS:   Constitutional:  No f/c, No night sweats, No unexplained weight loss. EENT:  No vision  changes, No blurry vision, No hearing changes. No mouth, throat, or ear problems.  Respiratory: No cough, No SOB Cardiac: No CP, no palpitations GI:  occasional abd pain, No N/V/D. GU: No Urinary s/sx Musculoskeletal: R arm pain Neuro: No headache, no dizziness, no motor weakness.  Skin: No rash Endocrine:  No polydipsia. No polyuria.  Psych: Denies SI/HI  Problem  Fatty Liver    ALLERGIES: Allergies  Allergen Reactions  . Fruit & Vegetable Daily [Nutritional Supplements] Other (See Comments)    "mouth rash" from Avocado, apple, peaches, plums, kiwi    PAST MEDICAL HISTORY: Past Medical History:  Diagnosis Date  . Abnormal Pap smear of cervix 09/24/2013   AGUS  . Asthma   . Diabetes mellitus without complication (HCC)   . Environmental allergies   . GERD (gastroesophageal reflux disease)     MEDICATIONS AT HOME: Prior to Admission medications   Medication Sig Start Date End Date Taking? Authorizing Provider  atorvastatin (LIPITOR) 20 MG tablet Take 1 tablet (20 mg total) by mouth daily. 04/04/18   Hoy Register, MD  busPIRone (BUSPAR) 10 MG tablet Take 1 tablet (10 mg total) by mouth 2 (two) times daily. 06/21/17   Quentin Angst, MD  cetirizine (ZYRTEC) 10 MG tablet Take 1 tablet (10 mg total) by mouth daily. 01/03/18   Hoy Register, MD  fluconazole (DIFLUCAN) 150 MG tablet Take 1 tablet (150 mg total) by mouth once for 1  dose. 06/14/18 06/14/18  Anders Simmonds, PA-C  glipiZIDE (GLUCOTROL) 10 MG tablet Take 1 tablet (10 mg total) by mouth 2 (two) times daily before a meal. 04/04/18   Hoy Register, MD  hydrocortisone cream 0.5 % Apply 1 application topically 2 (two) times daily. 04/04/18   Hoy Register, MD  hydrOXYzine (ATARAX/VISTARIL) 25 MG tablet Take 1 tablet (25 mg total) by mouth every 6 (six) hours. 03/16/18   Eustace Moore, MD  metFORMIN (GLUCOPHAGE) 1000 MG tablet Take 1 tablet (1,000 mg total) by mouth 2 (two) times daily with a meal. 04/04/18    Hoy Register, MD  methocarbamol (ROBAXIN) 500 MG tablet Take 2 tablets (1,000 mg total) by mouth 3 (three) times daily. 06/14/18   Anders Simmonds, PA-C  mometasone-formoterol (DULERA) 100-5 MCG/ACT AERO Inhale 2 puffs into the lungs 2 (two) times daily. 04/04/18   Hoy Register, MD  naproxen (NAPROSYN) 500 MG tablet Take 1 tablet (500 mg total) by mouth 2 (two) times daily with a meal. 06/14/18   Jayr Lupercio, Marzella Schlein, PA-C  pantoprazole (PROTONIX) 40 MG tablet Take 1 tablet (40 mg total) by mouth daily. 10/11/17   Quentin Angst, MD  traMADol (ULTRAM) 50 MG tablet Take 1 tablet (50 mg total) by mouth every 6 (six) hours as needed. 06/01/18   Fayrene Helper, PA-C     Objective:  EXAM:   Vitals:   06/14/18 1511  BP: 109/69  Pulse: 93  Resp: 18  Temp: 98.2 F (36.8 C)  TempSrc: Oral  SpO2: 98%  Weight: 194 lb (88 kg)  Height: 5\' 3"  (1.6 m)    General appearance : A&OX3. NAD. Non-toxic-appearing HEENT: Atraumatic and Normocephalic.  PERRLA. EOM intact.  Neck: supple, no JVD. No cervical lymphadenopathy. No thyromegaly Chest/Lungs:  Breathing-non-labored, Good air entry bilaterally, breath sounds normal without rales, rhonchi, or wheezing  CVS: S1 S2 regular, no murmurs, gallops, rubs  RUE/LUE full S&ROM.  No abnormality Abdomen: Bowel sounds present, Non tender and not distended with no gaurding, rigidity or rebound. Extremities: Bilateral Lower Ext shows no edema, both legs are warm to touch with = pulse throughout Neurology:  CN II-XII grossly intact, Non focal.   Psych:  TP linear. J/I WNL. Normal speech. Appropriate eye contact and affect.  Skin:  No Rash  Data Review Lab Results  Component Value Date   HGBA1C 8.5 (A) 06/14/2018   HGBA1C 9.0 (H) 03/09/2018   HGBA1C 8.9 01/03/2018     Assessment & Plan   1. Fatty liver Diet and exercise.  counseled at length-decrease fats and sugar  2. Type 2 diabetes mellitus with complication, without long-term current use of  insulin (HCC) Uncontrolled-increase compliance with meds and work on diabetic diet/reducing carbs.  Continue current medication regimen - Glucose (CBG) - HgB A1c  3. Language barrier stratus interpreters used and additional time performing visit was required.   4. Encounter for examination following treatment at hospital Improved from ED visit  5. Right arm pain No abnormality on exam - methocarbamol (ROBAXIN) 500 MG tablet; Take 2 tablets (1,000 mg total) by mouth 3 (three) times daily.  Dispense: 90 tablet; Refill: 0 - naproxen (NAPROSYN) 500 MG tablet; Take 1 tablet (500 mg total) by mouth 2 (two) times daily with a meal.  Dispense: 60 tablet; Refill: 0  6. Yeast infection - fluconazole (DIFLUCAN) 150 MG tablet; Take 1 tablet (150 mg total) by mouth once for 1 dose.  Dispense: 1 tablet; Refill: 0  Patient have been  counseled extensively about nutrition and exercise  Return for Dr Alvis Lemmings DM and lipids-keep November appt.  The patient was given clear instructions to go to ER or return to medical center if symptoms don't improve, worsen or new problems develop. The patient verbalized understanding. The patient was told to call to get lab results if they haven't heard anything in the next week.     Georgian Co, PA-C [facility] [place], [place] [phone]   06/14/2018, 3:23 PM

## 2018-06-14 ENCOUNTER — Ambulatory Visit: Payer: Medicaid Other | Attending: Family Medicine | Admitting: Physician Assistant

## 2018-06-14 VITALS — BP 109/69 | HR 93 | Temp 98.2°F | Resp 18 | Ht 63.0 in | Wt 194.0 lb

## 2018-06-14 DIAGNOSIS — Z7984 Long term (current) use of oral hypoglycemic drugs: Secondary | ICD-10-CM | POA: Insufficient documentation

## 2018-06-14 DIAGNOSIS — K219 Gastro-esophageal reflux disease without esophagitis: Secondary | ICD-10-CM | POA: Insufficient documentation

## 2018-06-14 DIAGNOSIS — Z79899 Other long term (current) drug therapy: Secondary | ICD-10-CM | POA: Insufficient documentation

## 2018-06-14 DIAGNOSIS — M79601 Pain in right arm: Secondary | ICD-10-CM | POA: Insufficient documentation

## 2018-06-14 DIAGNOSIS — Z09 Encounter for follow-up examination after completed treatment for conditions other than malignant neoplasm: Secondary | ICD-10-CM

## 2018-06-14 DIAGNOSIS — B379 Candidiasis, unspecified: Secondary | ICD-10-CM | POA: Insufficient documentation

## 2018-06-14 DIAGNOSIS — Z789 Other specified health status: Secondary | ICD-10-CM

## 2018-06-14 DIAGNOSIS — E118 Type 2 diabetes mellitus with unspecified complications: Secondary | ICD-10-CM | POA: Insufficient documentation

## 2018-06-14 DIAGNOSIS — K760 Fatty (change of) liver, not elsewhere classified: Secondary | ICD-10-CM | POA: Insufficient documentation

## 2018-06-14 LAB — POCT GLYCOSYLATED HEMOGLOBIN (HGB A1C): HBA1C, POC (CONTROLLED DIABETIC RANGE): 8.5 % — AB (ref 0.0–7.0)

## 2018-06-14 LAB — GLUCOSE, POCT (MANUAL RESULT ENTRY): POC GLUCOSE: 222 mg/dL — AB (ref 70–99)

## 2018-06-14 MED ORDER — NAPROXEN 500 MG PO TABS: 500.0000 mg | ORAL_TABLET | Freq: Two times a day (BID) | ORAL | 0 refills | Status: DC

## 2018-06-14 MED ORDER — FLUCONAZOLE 150 MG PO TABS: 150.0000 mg | ORAL_TABLET | Freq: Once | ORAL | 0 refills | Status: AC

## 2018-06-14 MED ORDER — METHOCARBAMOL 500 MG PO TABS: 1000.0000 mg | ORAL_TABLET | Freq: Three times a day (TID) | ORAL | 0 refills | Status: DC

## 2018-06-14 NOTE — Patient Instructions (Addendum)
Avoid alcohol and tylenol.  Drink more water.  Eat less sugar and fat.     Hgado graso (Fatty Liver) El hgado graso, tambin llamado esteatosis heptica o esteatohepatitis, es una enfermedad en la que se acumula demasiada grasa en las clulas del hgado. El hgado elimina las sustancias dainas del torrente sanguneo y produce lquidos que el cuerpo necesita. Tambin ayuda al cuerpo a Chemical engineer y Academic librarian la energa obtenida de los alimentos que come. En muchos casos, el hgado graso no provoca sntomas ni problemas. Con frecuencia, se diagnostica cuando se realizan estudios por otros motivos. Sin embargo, con Museum/gallery conservator, el hgado graso puede provocar una inflamacin que puede causar problemas hepticos ms graves, como fibrosis heptica (cirrosis). CAUSAS Las causas del hgado graso pueden incluir las siguientes:  Beber alcohol en exceso.  Dficit nutricional.  Obesidad.  Sndrome de [place].  Diabetes.  Hiperlipidemia.  Embarazo.  Determinados medicamentos.  Txicos.  Algunas infecciones virales. FACTORES DE RIESGO Puede tener ms probabilidades de tener hgado graso si:  Consume alcohol en exceso.  Est embarazada.  Tiene sobrepeso.  Tiene diabetes.  Tiene hepatitis.  Tiene un nivel alto de triglicridos. SIGNOS Y SNTOMAS El hgado graso con frecuencia no provoca sntomas. En los casos en que se presentan sntomas, estos pueden incluir:  Fatiga.  Debilidad.  Prdida de peso.  Confusin.  Dolor abdominal.  Color amarillo en la piel y en la zona blanca de los ojos (ictericia).  Nuseas y vmitos. DIAGNSTICO El hgado graso se puede diagnosticar mediante lo siguiente:  Un examen fsico y Neomia Dear historia clnica.  Anlisis de [place].  Estudios de diagnstico por imgenes, como ecografa, tomografa computarizada (TC) o Health visitor (RM).  Biopsia de hgado. Se extrae una pequea muestra de tejido del hgado usando [place]. La muestra se  examina en el microscopio. TRATAMIENTO El hgado graso con frecuencia es causado por otras enfermedades. El tratamiento para el hgado graso puede incluir medicamentos y cambios en el estilo de vida para controlar enfermedades como:  Alcoholismo.  Colesterol elevado.  Diabetes.  Exceso de [place] u obesidad. INSTRUCCIONES PARA EL CUIDADO EN EL HOGAR  Siga una dieta saludable como se lo haya indicado el mdico.  Haga ejercicios regularmente. Esto puede ayudarlo a [employer], y a Public house manager y la diabetes. Hable con el mdico sobre qu actividades son mejores para usted y IT trainer de ejercicios.  No beba alcohol.  Tome los medicamentos solamente como se lo haya indicado el mdico. SOLICITE ATENCIN MDICA SI: Tiene dificultad para controlar lo siguiente:  Nivel de [place].  Colesterol.  Consumo de alcohol. SOLICITE ATENCIN MDICA DE INMEDIATO SI:  Siente dolor abdominal.  Tiene ictericia.  Tiene nuseas o vmitos. Esta informacin no tiene Theme park manager el consejo del mdico. Asegrese de hacerle al mdico cualquier pregunta que tenga. Document Released: 06/26/2013 Document Revised: 09/26/2014 Document Reviewed: 01/15/2014 Elsevier Interactive Patient Education  2018 [employer].  Diabetes mellitus y nutricin Diabetes Mellitus and Nutrition Si sufre de diabetes (diabetes mellitus), es muy importante tener hbitos alimenticios saludables debido a que sus niveles de Psychologist, counselling sangre (glucosa) se ven afectados en gran medida por lo que come y bebe. [place] alimentos saludables en las cantidades [place], aproximadamente a la [employer], [place] ayudar a: Scientist, physiological glucemia. Disminuir el riesgo de sufrir una enfermedad cardaca. Mejorar la presin arterial. [name] o mantener un peso saludable.  Todas las personas que sufren de diabetes son diferentes  y cada una tiene necesidades diferentes en cuanto a un plan de  alimentacin. El mdico puede recomendarle que trabaje con un especialista en dietas y nutricin (nutricionista) para Tax adviser plan para usted. Su plan de alimentacin puede variar segn factores como: Las caloras que necesita. Los medicamentos que toma. Su peso. Sus niveles de glucemia, presin arterial y colesterol. Su nivel de [name]. Otras afecciones que tenga, como enfermedades cardacas o renales.  Cmo me afectan los carbohidratos? Los carbohidratos afectan el nivel de glucemia ms que cualquier otro tipo de alimento. La ingesta de carbohidratos naturalmente aumenta la cantidad glucosa en la sangre. El recuento de carbohidratos es un mtodo destinado a Midwife un registro de la cantidad de carbohidratos que se ingieren. El recuento de carbohidratos es importante para Pharmacologist la glucemia a un nivel saludable, en especial si utiliza insulina o toma determinados medicamentos por va oral para la diabetes. Es importante saber la cantidad de carbohidratos que se pueden ingerir en cada comida sin correr Surveyor, minerals. Esto es Government social research officer. El nutricionista puede ayudarlo a calcular la cantidad de carbohidratos que debe ingerir en cada comida y colacin. Los alimentos que contienen carbohidratos incluyen: Pan, cereal, arroz, pasta y galletas. Papas y maz. Guisantes, frijoles y lentejas. Leche y Dentist. Nils Pyle y [place]. Postres, como pasteles, galletitas, helado y caramelos.  Cmo me afecta el alcohol? El alcohol puede provocar disminuciones sbitas de la glucemia (hipoglucemia), en especial si utiliza insulina o toma determinados medicamentos por va oral para la diabetes. La hipoglucemia es una afeccin potencialmente mortal. Los sntomas de la hipoglucemia (somnolencia, mareos y confusin) son similares a los sntomas de haber consumido demasiado alcohol. Si el mdico afirma que el alcohol es seguro para usted, siga estas pautas: Limite el consumo de alcohol a no ms  de 1 medida por da si es mujer y no est Orthoptist, y a 2 medidas si es hombre. Una medida equivale a 12oz ( ) de cerveza, 5oz ( ) de vino o 1oz (44ml) de bebidas de alta graduacin alcohlica. No beba con el estmago vaco. Mantngase hidratado con agua, gaseosas dietticas o t helado sin azcar. Tenga en cuenta que las gaseosas comunes, los jugos y otros refrescos pueden contener mucha azcar y se deben contar como carbohidratos.  Consejos para seguir Consulting civil engineer las etiquetas de los alimentos Comience por controlar el tamao de la porcin en la etiqueta. La cantidad de caloras, carbohidratos, grasas y otros nutrientes mencionados en la etiqueta se basan en una porcin del alimento. Muchos alimentos contienen ms de una porcin por envase. Verifique la cantidad total de gramos (g) de carbohidratos totales en una porcin. Puede calcular la cantidad de porciones de carbohidratos al dividir el total de carbohidratos por 15. Por ejemplo, si un alimento posee un total de 30g de carbohidratos, equivale a 2 porciones de carbohidratos. Verifique la cantidad de gramos (g) de grasas saturadas y grasas trans en una porcin. Escoja alimentos que no contengan grasa o que tengan un bajo contenido. Controle la cantidad de miligramos (mg) de sodio en una porcin. La mayora de las personas deben limitar la ingesta de sodio total a menos de 2300mg  por Futures trader. Siempre consulte la informacin nutricional de los alimentos etiquetados como "con bajo contenido de grasa" o "sin grasa". Estos alimentos pueden ser ms altos en azcar agregada o en carbohidratos refinados y deben evitarse. Hable con el nutricionista para identificar sus objetivos diarios en cuanto a los nutrientes mencionados en la etiqueta. De compras Evite comprar  alimentos procesados, enlatados o prehechos. Estos alimentos tienden a Civil Service fast streamer cantidad de [place], sodio y azcar agregada. Compre en la zona exterior de la tienda de  comestibles. Esta incluye frutas y Medtronic, granos a granel, carnes frescas y productos lcteos frescos. Coccin Utilice mtodos de coccin a baja temperatura, como hornear, en lugar de mtodos de coccin a alta temperatura, como frer en abundante aceite. Cocine con aceites saludables, como el aceite de [place], canola o [place]. Evite cocinar con manteca, crema o carnes con alto contenido de grasa. Planificacin de las comidas [employer] comidas y las colaciones de forma regular, preferentemente a la misma hora todos [place]. Evite pasar largos perodos de tiempo sin comer. Consuma alimentos ricos en fibra, como frutas frescas, verduras, frijoles y cereales integrales. Consulte al nutricionista sobre cuntas porciones de carbohidratos puede consumir en cada comida. Consuma entre 4 y 6 onzas de protenas magras por da, como carnes [place], pollo, pescado, [employer] o tofu. 1 onza equivale a 1 onza de carne, pollo o pescado, 1 huevo, o 1/4 taza de tofu. Coma algunos alimentos por da que contengan grasas saludables, como aguacates, frutos secos, semillas y pescado. Estilo de vida  Controle su nivel de glucemia con regularidad. Haga ejercicio al menos , 5das o ms por semana, o como se lo haya indicado el mdico. Tome los [employer] se lo haya indicado el mdico. No consuma ningn producto que contenga nicotina o tabaco, como cigarrillos y Administrator, Civil Service. Si necesita ayuda para dejar de fumar, consulte al [employer] con un asesor o instructor en diabetes para identificar estrategias para controlar el estrs y cualquier desafo emocional y social. Cules son algunas de las preguntas que puedo hacerle a mi mdico? Es necesario que me rena con IT trainer en diabetes? Es necesario que me rena con un nutricionista? A qu nmero puedo llamar si tengo preguntas? Cules son los mejores momentos para controlar la glucemia? Dnde encontrar ms  informacin: Asociacin Americana de la Diabetes (American Diabetes Association): diabetes.org/food-and-fitness/food Academia de Nutricin y Pension scheme manager (Academy of Nutrition and Dietetics): https://www.vargas.com/ [name] de la Diabetes y las Enfermedades Digestivas y Special educational needs teacher [facility] of Diabetes and Digestive and Kidney Diseases) (Institutos Nacionales de [place], NIH): FindJewelers.cz Resumen Un plan de alimentacin saludable lo ayudar a Scientist, physiological glucemia y Pharmacologist un estilo de vida saludable. Trabajar con un especialista en dietas y nutricin (nutricionista) puede ayudarlo a Designer, television/film set de alimentacin para usted. Tenga en cuenta que los carbohidratos y el alcohol tienen efectos inmediatos en sus niveles de glucemia. Es importante contar los carbohidratos y consumir alcohol con prudencia. Esta informacin no tiene Theme park manager el consejo del mdico. Asegrese de hacerle al mdico cualquier pregunta que tenga. Document Released: 12/13/2007 Document Revised: 12/26/2016 Document Reviewed: 12/26/2016 Elsevier Interactive Patient Education  2018 [employer].

## 2018-07-02 ENCOUNTER — Other Ambulatory Visit: Payer: Self-pay

## 2018-07-25 ENCOUNTER — Ambulatory Visit: Payer: Self-pay | Admitting: Family Medicine

## 2018-07-26 ENCOUNTER — Ambulatory Visit: Payer: Self-pay | Attending: Family Medicine | Admitting: Family Medicine

## 2018-07-26 ENCOUNTER — Encounter: Payer: Self-pay | Admitting: Family Medicine

## 2018-07-26 VITALS — BP 97/62 | HR 70 | Temp 98.1°F | Ht 63.0 in | Wt 191.0 lb

## 2018-07-26 DIAGNOSIS — Z9889 Other specified postprocedural states: Secondary | ICD-10-CM | POA: Insufficient documentation

## 2018-07-26 DIAGNOSIS — Z7984 Long term (current) use of oral hypoglycemic drugs: Secondary | ICD-10-CM | POA: Insufficient documentation

## 2018-07-26 DIAGNOSIS — E7800 Pure hypercholesterolemia, unspecified: Secondary | ICD-10-CM | POA: Insufficient documentation

## 2018-07-26 DIAGNOSIS — K2990 Gastroduodenitis, unspecified, without bleeding: Secondary | ICD-10-CM

## 2018-07-26 DIAGNOSIS — N814 Uterovaginal prolapse, unspecified: Secondary | ICD-10-CM | POA: Insufficient documentation

## 2018-07-26 DIAGNOSIS — Z79899 Other long term (current) drug therapy: Secondary | ICD-10-CM | POA: Insufficient documentation

## 2018-07-26 DIAGNOSIS — B373 Candidiasis of vulva and vagina: Secondary | ICD-10-CM | POA: Insufficient documentation

## 2018-07-26 DIAGNOSIS — K219 Gastro-esophageal reflux disease without esophagitis: Secondary | ICD-10-CM | POA: Insufficient documentation

## 2018-07-26 DIAGNOSIS — B3731 Acute candidiasis of vulva and vagina: Secondary | ICD-10-CM

## 2018-07-26 DIAGNOSIS — K2970 Gastritis, unspecified, without bleeding: Secondary | ICD-10-CM

## 2018-07-26 DIAGNOSIS — E118 Type 2 diabetes mellitus with unspecified complications: Secondary | ICD-10-CM | POA: Insufficient documentation

## 2018-07-26 DIAGNOSIS — Z9114 Patient's other noncompliance with medication regimen: Secondary | ICD-10-CM | POA: Insufficient documentation

## 2018-07-26 DIAGNOSIS — J4520 Mild intermittent asthma, uncomplicated: Secondary | ICD-10-CM | POA: Insufficient documentation

## 2018-07-26 LAB — GLUCOSE, POCT (MANUAL RESULT ENTRY): POC GLUCOSE: 252 mg/dL — AB (ref 70–99)

## 2018-07-26 MED ORDER — CLOTRIMAZOLE 1 % EX CREA: 1.0000 "application " | TOPICAL_CREAM | Freq: Two times a day (BID) | CUTANEOUS | 0 refills | Status: DC

## 2018-07-26 MED ORDER — PANTOPRAZOLE SODIUM 40 MG PO TBEC: 40.0000 mg | DELAYED_RELEASE_TABLET | Freq: Every day | ORAL | 6 refills | Status: DC

## 2018-07-26 MED ORDER — MOMETASONE FURO-FORMOTEROL FUM 100-5 MCG/ACT IN AERO: 2.0000 | INHALATION_SPRAY | Freq: Two times a day (BID) | RESPIRATORY_TRACT | 6 refills | Status: DC

## 2018-07-26 MED ORDER — SITAGLIPTIN PHOSPHATE 100 MG PO TABS: 100.0000 mg | ORAL_TABLET | Freq: Every day | ORAL | 6 refills | Status: DC

## 2018-07-26 MED ORDER — METFORMIN HCL 1000 MG PO TABS: ORAL_TABLET | ORAL | 6 refills | Status: DC

## 2018-07-26 MED ORDER — ALBUTEROL SULFATE HFA 108 (90 BASE) MCG/ACT IN AERS: 2.0000 | INHALATION_SPRAY | Freq: Four times a day (QID) | RESPIRATORY_TRACT | 2 refills | Status: DC | PRN

## 2018-07-26 MED ORDER — FLUCONAZOLE 150 MG PO TABS: 150.0000 mg | ORAL_TABLET | Freq: Once | ORAL | 0 refills | Status: AC

## 2018-07-26 MED ORDER — ATORVASTATIN CALCIUM 20 MG PO TABS: 20.0000 mg | ORAL_TABLET | Freq: Every day | ORAL | 6 refills | Status: DC

## 2018-07-26 MED ORDER — GLIPIZIDE 10 MG PO TABS: 10.0000 mg | ORAL_TABLET | Freq: Two times a day (BID) | ORAL | 6 refills | Status: DC

## 2018-07-26 NOTE — Progress Notes (Signed)
Subjective:  Patient ID: Alexandra Aguirre, female    DOB: 26-Jul-1976  Age: 42 y.o. MRN: [id number]  CC: Diabetes and Vaginitis   HPI Alexandra Aguirre  is a 42 year old female with history of type 2 diabetes mellitus (A1c 8.5), hyperlipidemia, GERD, gastritis who presents today for follow-up visit. She complains of vaginal itching for the last 2 weeks and also a feeling of vaginal pressure " like something is about to fall out".  Denies change in sexual partner, has no urinary frequency or dysuria. With regards to her diabetes mellitus she has not been compliant with her medications and does not have a fasting sugars with her; she has also not been compliant with a diabetic diet.  She denies visual concerns, hypoglycemia, neuropathy. Her gastritis and reflux symptoms have worsened as she has been out of her PPI and is requesting a refill. She denies recent asthma exacerbations and has been compliant with her inhalers.  Past Medical History:  Diagnosis Date  . Abnormal Pap smear of cervix 09/24/2013   AGUS  . Asthma   . Diabetes mellitus without complication (HCC)   . Environmental allergies   . GERD (gastroesophageal reflux disease)    Past Surgical History:  Procedure Laterality Date  . CERVICAL BIOPSY  W/ LOOP ELECTRODE EXCISION    . removal of cyst Right 2011     Outpatient Medications Prior to Visit  Medication Sig Dispense Refill  . busPIRone (BUSPAR) 10 MG tablet Take 1 tablet (10 mg total) by mouth 2 (two) times daily. 60 tablet 3  . cetirizine (ZYRTEC) 10 MG tablet Take 1 tablet (10 mg total) by mouth daily. 30 tablet 3  . hydrocortisone cream 0.5 % Apply 1 application topically 2 (two) times daily. 56 g 1  . methocarbamol (ROBAXIN) 500 MG tablet Take 2 tablets (1,000 mg total) by mouth 3 (three) times daily. 90 tablet 0  . naproxen (NAPROSYN) 500 MG tablet Take 1 tablet (500 mg total) by mouth 2 (two) times daily with a meal. 60 tablet 0  . traMADol (ULTRAM) 50 MG tablet Take 1  tablet (50 mg total) by mouth every 6 (six) hours as needed. 15 tablet 0  . atorvastatin (LIPITOR) 20 MG tablet Take 1 tablet (20 mg total) by mouth daily. 30 tablet 6  . glipiZIDE (GLUCOTROL) 10 MG tablet Take 1 tablet (10 mg total) by mouth 2 (two) times daily before a meal. 60 tablet 6  . hydrOXYzine (ATARAX/VISTARIL) 25 MG tablet Take 1 tablet (25 mg total) by mouth every 6 (six) hours. 12 tablet 0  . metFORMIN (GLUCOPHAGE) 1000 MG tablet Take 1 tablet (1,000 mg total) by mouth 2 (two) times daily with a meal. 120 tablet 6  . mometasone-formoterol (DULERA) 100-5 MCG/ACT AERO Inhale 2 puffs into the lungs 2 (two) times daily. 13 g 6  . pantoprazole (PROTONIX) 40 MG tablet Take 1 tablet (40 mg total) by mouth daily. 30 tablet 3   No facility-administered medications prior to visit.     ROS Review of Systems  Constitutional: Negative for activity change, appetite change and fatigue.  HENT: Negative for congestion, sinus pressure and sore throat.   Eyes: Negative for visual disturbance.  Respiratory: Negative for cough, chest tightness, shortness of breath and wheezing.   Cardiovascular: Negative for chest pain and palpitations.  Gastrointestinal: Negative for abdominal distention, abdominal pain and constipation.  Endocrine: Negative for polydipsia.  Genitourinary: Negative for dysuria and frequency.  Musculoskeletal: Negative for arthralgias and back pain.  Skin:  Negative for rash.  Neurological: Negative for tremors, light-headedness and numbness.  Hematological: Does not bruise/bleed easily.  Psychiatric/Behavioral: Negative for agitation and behavioral problems.    Objective:  BP 97/62   Pulse 70   Temp 98.1 F (36.7 C) (Oral)   Ht 5\' 3"  (1.6 m)   Wt 191 lb (86.6 kg)   SpO2 98%   BMI 33.83 kg/m   BP/Weight 07/26/2018 06/14/2018 06/01/2018  Systolic BP 97 109 106  Diastolic BP 62 69 74  Wt. (Lbs) 191 194 -  BMI 33.83 34.37 -      Physical Exam  Constitutional: She is  oriented to person, place, and time. She appears well-developed and well-nourished.  Cardiovascular: Normal rate, normal heart sounds and intact distal pulses.  No murmur heard. Pulmonary/Chest: Effort normal and breath sounds normal. She has no wheezes. She has no rales. She exhibits no tenderness.  Abdominal: Soft. Bowel sounds are normal. She exhibits no distension and no mass. There is no tenderness.  Musculoskeletal: Normal range of motion.  Neurological: She is alert and oriented to person, place, and time.  Skin: Skin is warm and dry.  Psychiatric: She has a normal mood and affect.    CMP Latest Ref Rng & Units 05/31/2018 11/25/2017 10/12/2017  Glucose 70 - 99 mg/dL 161(W) 960(A) 540(J)  BUN 6 - 20 mg/dL 7 5(L) 8  Creatinine 8.11 - 1.00 mg/dL 9.14 7.82 9.56  Sodium 135 - 145 mmol/L 136 131(L) 137  Potassium 3.5 - 5.1 mmol/L 3.5 4.0 4.2  Chloride 98 - 111 mmol/L 102 99(L) 98  CO2 22 - 32 mmol/L 22 21(L) 23  Calcium 8.9 - 10.3 mg/dL 9.2 2.1(H) 9.3  Total Protein 6.5 - 8.1 g/dL 7.6 7.4 7.4  Total Bilirubin 0.3 - 1.2 mg/dL 0.5 0.9 0.3  Alkaline Phos 38 - 126 U/L 130(H) 139(H) 142(H)  AST 15 - 41 U/L 30 60(H) 59(H)  ALT 0 - 44 U/L 34 54 71(H)    Lipid Panel     Component Value Date/Time   CHOL 191 03/09/2018 1147   CHOL 167 03/01/2017 1129   TRIG 190 (H) 03/09/2018 1147   HDL 31 (L) 03/09/2018 1147   HDL 31 (L) 03/01/2017 1129   CHOLHDL 6.2 03/09/2018 1147   VLDL 38 03/09/2018 1147   LDLCALC 122 (H) 03/09/2018 1147   LDLCALC 95 03/01/2017 1129    Lab Results  Component Value Date   HGBA1C 8.5 (A) 06/14/2018     Assessment & Plan:   1. Type 2 diabetes mellitus with complication, without long-term current use of insulin (HCC) Uncontrolled with A1c of 8.5 She is reluctant to commencing an injectable Increased dose of metformin and Januvia added to regimen If glycemic control is still inadequate at her next visit I will place her on - POCT glucose (manual entry) -  metFORMIN (GLUCOPHAGE) 1000 MG tablet; Take orally 3 tabs (1500 mg) in the morning and 2 tabs (1000 mg) in the evening  Dispense: 150 tablet; Refill: 6 - glipiZIDE (GLUCOTROL) 10 MG tablet; Take 1 tablet (10 mg total) by mouth 2 (two) times daily before a meal.  Dispense: 60 tablet; Refill: 6 - sitaGLIPtin (JANUVIA) 100 MG tablet; Take 1 tablet (100 mg total) by mouth daily.  Dispense: 30 tablet; Refill: 6  2. Gastroesophageal reflux disease, esophagitis presence not specified/ Gastritis Controlled - pantoprazole (PROTONIX) 40 MG tablet; Take 1 tablet (40 mg total) by mouth daily.  Dispense: 30 tablet; Refill: 6   3. Mild  intermittent asthma without complication Controlled No acute exacerbation - mometasone-formoterol (DULERA) 100-5 MCG/ACT AERO; Inhale 2 puffs into the lungs 2 (two) times daily.  Dispense: 13 g; Refill: 6 - albuterol (PROVENTIL HFA;VENTOLIN HFA) 108 (90 Base) MCG/ACT inhaler; Inhale 2 puffs into the lungs every 6 (six) hours as needed for wheezing or shortness of breath.  Dispense: 1 Inhaler; Refill: 2  4. Vaginal candidiasis - fluconazole (DIFLUCAN) 150 MG tablet; Take 1 tablet (150 mg total) by mouth once for 1 dose.  Dispense: 1 tablet; Refill: 0 - clotrimazole (LOTRIMIN) 1 % cream; Apply 1 application topically 2 (two) times daily.  Dispense: 30 g; Refill: 0  5. Uterine prolapse - Ambulatory referral to Gynecology  6. Pure hypercholesterolemia - atorvastatin (LIPITOR) 20 MG tablet; Take 1 tablet (20 mg total) by mouth daily.  Dispense: 30 tablet; Refill: 6   Meds ordered this encounter  Medications  . mometasone-formoterol (DULERA) 100-5 MCG/ACT AERO    Sig: Inhale 2 puffs into the lungs 2 (two) times daily.    Dispense:  13 g    Refill:  6  . metFORMIN (GLUCOPHAGE) 1000 MG tablet    Sig: Take orally 3 tabs (1500 mg) in the morning and 2 tabs (1000 mg) in the evening    Dispense:  150 tablet    Refill:  6    Discontinue previous dose  . glipiZIDE (GLUCOTROL)  10 MG tablet    Sig: Take 1 tablet (10 mg total) by mouth 2 (two) times daily before a meal.    Dispense:  60 tablet    Refill:  6    Decrease previous dose  . atorvastatin (LIPITOR) 20 MG tablet    Sig: Take 1 tablet (20 mg total) by mouth daily.    Dispense:  30 tablet    Refill:  6  . pantoprazole (PROTONIX) 40 MG tablet    Sig: Take 1 tablet (40 mg total) by mouth daily.    Dispense:  30 tablet    Refill:  6  . sitaGLIPtin (JANUVIA) 100 MG tablet    Sig: Take 1 tablet (100 mg total) by mouth daily.    Dispense:  30 tablet    Refill:  6  . fluconazole (DIFLUCAN) 150 MG tablet    Sig: Take 1 tablet (150 mg total) by mouth once for 1 dose.    Dispense:  1 tablet    Refill:  0  . clotrimazole (LOTRIMIN) 1 % cream    Sig: Apply 1 application topically 2 (two) times daily.    Dispense:  30 g    Refill:  0  . albuterol (PROVENTIL HFA;VENTOLIN HFA) 108 (90 Base) MCG/ACT inhaler    Sig: Inhale 2 puffs into the lungs every 6 (six) hours as needed for wheezing or shortness of breath.    Dispense:  1 Inhaler    Refill:  2    Follow-up: Return in about 3 months (around 10/26/2018) for Follow-up of chronic medical conditions.   Hoy Register MD

## 2018-07-26 NOTE — Patient Instructions (Signed)
Prolapso de los rganos de la pelvis  (Pelvic Organ Prolapse)  El prolapso de los rganos de la pelvis es el estiramiento, la dilatacin o la cada de los rganos de la pelvis a una posicin anormal. Esto sucede cuando los msculos y tejidos que rodean y sostienen las estructuras plvicas se estiran o se debilitan. El prolapso de los rganos de la pelvis puede involucrar los siguientes rganos:   Vagina (prolapso vaginal).   tero (prolapso uterino).   Vejiga (cistocele).   Recto (rectocele).   Intestinos (enterocele).  Cuando estn comprometidos rganos que no son la vagina, estos a menudo se protruyen hacia adentro o hacia afuera de la vagina, dependiendo de la gravedad del prolapso.  CAUSAS  Algunas causas de esta enfermedad incluyen lo siguiente:   Embarazo, trabajo de parto y parto.   Tos prolongada (crnica).   Estreimiento crnico.   Obesidad.   Ciruga de pelvis anterior.   Envejecimiento. Durante y despus de la menopausia, una disminucin en la produccin de estrgenos puede debilitar los msculos y los ligamentos plvicos.   Levantar sistemticamente objetos pesados de ms de 50libras (23kg).   Acumulacin de lquido en el abdomen debido a ciertas enfermedades y a otras afecciones.  SNTOMAS  Los sntomas de esta afeccin incluyen lo siguiente:   Prdida del control de la vejiga al toser, estornudar, hacer un esfuerzo y hacer ejercicio (incontinencia urinaria de esfuerzo). Esto puede empeorar inmediatamente despus del nacimiento y mejorar gradualmente con el tiempo.   Sensacin de presin en la pelvis o la vagina. Esta presin puede aumentar al toser o defecar.   Una protuberancia que sobresale desde la apertura de la vagina o contra la pared vaginal. Si el tero sobresale a travs de la apertura de la vagina y roza la ropa, tambin puede padecer irritacin, lceras, infeccin, dolor y sangrado.   Mayor esfuerzo para defecar u orinar.   Dolor en la parte inferior de la  espalda.   Dolor, molestias o desinters en las relaciones sexuales.   Infecciones reiteradas en la vejiga (infecciones en las vas urinarias).   Dificultad o incapacidad para colocar un tampn o un aplicador.  En algunas personas, esta afeccin no produce sntomas.  DIAGNSTICO  El mdico puede realizarle un examen interno y externo de la vagina y el recto. Durante el examen, puede pedirle que tosa y que haga un esfuerzo mientras est acostada, sentada y de pie. El mdico determinar si se requieren ms estudios, como las pruebas de la funcin de la vejiga.  TRATAMIENTO  En la mayora de los casos, esta afeccin debe tratarse solo si produce sntomas. No existe ningn tratamiento que garantice que el prolapso se corregir o que alivie los sntomas por completo. El tratamiento puede incluir lo siguiente:   Cambios de estilo de vida tales como:  ? Evitar las bebidas que contengan cafena.  ? Aumentar la ingesta de alimentos con alto contenido de fibra. Esto puede ayudar a disminuir el estreimiento y el esfuerzo al defecar.  ? Vaciar la vejiga en momentos programados (terapia de entrenamiento de la vejiga). Esto puede ayudar a reducir o evitar la incontinencia urinaria.  ? Bajar de peso si tiene sobrepeso o es obeso.   Estrgeno. Si el prolapso es leve, el estrgeno puede ayudar al aumentar la fuerza y el tono de los msculos del piso plvico.   Ejercicios de Kegel. Estos ejercicios pueden ayudar en los casos leves de prolapso al estirar y tensar los msculos del piso plvico.   Colocacin   de un pesario. Un pesario es un dispositivo blando y flexible que el mdico coloca en la vagina para ayudar a sostener las paredes vaginales y mantener los rganos de la pelvis en su lugar.   Ciruga. Esta es a menudo la nica forma de tratamiento de los casos graves de prolapso. Existen diferentes tipos de ciruga disponibles.  INSTRUCCIONES PARA EL CUIDADO EN EL HOGAR   Use una toalla higinica o un producto absorbente  si tiene incontinencia urinaria.   Evite levantar objetos pesados y realizar esfuerzos mientras se ejercita o trabaja. No contenga la respiracin cuando levanta pesas y realiza ejercicios de intensidad leve a moderada. Limite sus actividades segn las indicaciones del mdico.   Tome los medicamentos solamente como se lo haya indicado el mdico.   Practique los ejercicios de Kegel como se lo haya indicado el mdico.   Si tiene un pesario, selo como se lo haya indicado el mdico.  SOLICITE ATENCIN MDICA SI:   Sus sntomas interfieren con sus actividades diarias o su vida sexual.   Necesita medicamentos para aliviar las molestias.   Observa sangrado vaginal no relacionado con su menstruacin.   Tiene fiebre.   Siente dolor o tiene una hemorragia al orinar.   Observa sangrado al defecar.   Pierde orina durante las relaciones sexuales.   Sufre estreimiento crnico.   Tiene un pesario colocado y se le cae.   Tiene secrecin vaginal con olor ftido.   Tiene dolor o clicos inusuales en la parte inferior del abdomen.  Esta informacin no tiene como fin reemplazar el consejo del mdico. Asegrese de hacerle al mdico cualquier pregunta que tenga.  Document Released: 04/02/2014 Document Revised: 04/02/2014 Document Reviewed: 11/18/2013  [employer] Interactive Patient Education  2018 [employer]

## 2018-07-26 NOTE — Progress Notes (Signed)
Patient has bumps and itching around vaginal area for 2 weeks.

## 2018-07-30 ENCOUNTER — Encounter (HOSPITAL_COMMUNITY): Payer: Self-pay | Admitting: Emergency Medicine

## 2018-07-30 ENCOUNTER — Emergency Department (HOSPITAL_COMMUNITY)
Admission: EM | Admit: 2018-07-30 | Discharge: 2018-07-30 | Disposition: A | Discharge status: Home / Self Care | Payer: Self-pay | Attending: Emergency Medicine | Admitting: Emergency Medicine

## 2018-07-30 ENCOUNTER — Emergency Department (HOSPITAL_COMMUNITY): Payer: Self-pay

## 2018-07-30 DIAGNOSIS — Z79899 Other long term (current) drug therapy: Secondary | ICD-10-CM | POA: Insufficient documentation

## 2018-07-30 DIAGNOSIS — Y929 Unspecified place or not applicable: Secondary | ICD-10-CM | POA: Insufficient documentation

## 2018-07-30 DIAGNOSIS — W268XXA Contact with other sharp object(s), not elsewhere classified, initial encounter: Secondary | ICD-10-CM | POA: Insufficient documentation

## 2018-07-30 DIAGNOSIS — S61217A Laceration without foreign body of left little finger without damage to nail, initial encounter: Secondary | ICD-10-CM | POA: Insufficient documentation

## 2018-07-30 DIAGNOSIS — J45909 Unspecified asthma, uncomplicated: Secondary | ICD-10-CM | POA: Insufficient documentation

## 2018-07-30 DIAGNOSIS — E119 Type 2 diabetes mellitus without complications: Secondary | ICD-10-CM | POA: Insufficient documentation

## 2018-07-30 DIAGNOSIS — Y9389 Activity, other specified: Secondary | ICD-10-CM | POA: Insufficient documentation

## 2018-07-30 DIAGNOSIS — Z7984 Long term (current) use of oral hypoglycemic drugs: Secondary | ICD-10-CM | POA: Insufficient documentation

## 2018-07-30 DIAGNOSIS — Y999 Unspecified external cause status: Secondary | ICD-10-CM | POA: Insufficient documentation

## 2018-07-30 MED ORDER — CEPHALEXIN 500 MG PO CAPS: 500.0000 mg | ORAL_CAPSULE | Freq: Four times a day (QID) | ORAL | 0 refills | Status: AC

## 2018-07-30 MED ORDER — LIDOCAINE HCL (PF) 1 % IJ SOLN
5.0000 mL | Freq: Once | INTRAMUSCULAR | Status: AC
  Administered 2018-07-30: 5 mL
  Filled 2018-07-30: qty 5

## 2018-07-30 MED ORDER — ACETAMINOPHEN 325 MG PO TABS
650.0000 mg | ORAL_TABLET | Freq: Once | ORAL | Status: AC
  Administered 2018-07-30: 650 mg via ORAL
  Filled 2018-07-30: qty 2

## 2018-07-30 NOTE — ED Provider Notes (Addendum)
[facility] EMERGENCY DEPARTMENT Provider Note   CSN: [id number] Arrival date & time: 07/30/18  1806     History   Chief Complaint Chief Complaint  Patient presents with  . Finger Injury    HPI    Stratus video translator used throughout this visit. Alexandra Aguirre is a 42 y.o. female with history of diabetes presenting for laceration of the left fifth MCP that occurred around 5:30 PM this evening.  Patient stuck her hand into her trash can to pull something out when she cut her finger on a broken picture frame.  Patient describes her pain as an immediate moderate intensity sharp sensation that has been constant in severity.  Patient controlled bleeding with paper towel prior to presentation at ED.  Patient denies any other injury today.  HPI  Past Medical History:  Diagnosis Date  . Abnormal Pap smear of cervix 09/24/2013   AGUS  . Asthma   . Diabetes mellitus without complication (HCC)   . Environmental allergies   . GERD (gastroesophageal reflux disease)     Patient Active Problem List   Diagnosis Date Noted  . Fatty liver 06/14/2018  . Hyperlipidemia 04/04/2018  . Muscle cramping 10/11/2017  . Dental caries 06/21/2017  . Vitamin D deficiency 03/01/2017  . Muscle strain of right upper arm 03/01/2017  . Type 2 diabetes mellitus with complication, without long-term current use of insulin (HCC) 10/12/2016  . Gastroesophageal reflux disease 10/12/2016  . Generalized anxiety disorder 10/12/2016  . UTI (urinary tract infection) 01/30/2014  . Vaginal bleeding between periods 01/30/2014  . Dysmenorrhea 01/30/2014  . Menorrhagia 01/30/2014  . Asthma 01/15/2014  . Vertigo 11/25/2013  . History of cervical dysplasia 11/01/2013  . Atypical glandular cells of undetermined significance (AGUS) on cervical Pap smear 10/08/2013  . Breast pain, right 10/08/2013  . Pedal edema 09/30/2013  . Migraine headache 09/30/2013  . Gastritis and gastroduodenitis 09/30/2013    . Back pain 09/30/2013    Past Surgical History:  Procedure Laterality Date  . CERVICAL BIOPSY  W/ LOOP ELECTRODE EXCISION    . removal of cyst Right 2011     OB History    Gravida  5   Para  5   Term  5   Preterm      AB      Living  5     SAB      TAB      Ectopic      Multiple      Live Births               Home Medications    Prior to Admission medications   Medication Sig Start Date End Date Taking? Authorizing Provider  albuterol (PROVENTIL HFA;VENTOLIN HFA) 108 (90 Base) MCG/ACT inhaler Inhale 2 puffs into the lungs every 6 (six) hours as needed for wheezing or shortness of breath. 07/26/18   Hoy Register, MD  atorvastatin (LIPITOR) 20 MG tablet Take 1 tablet (20 mg total) by mouth daily. 07/26/18   Hoy Register, MD  busPIRone (BUSPAR) 10 MG tablet Take 1 tablet (10 mg total) by mouth 2 (two) times daily. 06/21/17   Quentin Angst, MD  cephALEXin (KEFLEX) 500 MG capsule Take 1 capsule (500 mg total) by mouth 4 (four) times daily for 7 days. 07/30/18 08/06/18  Harlene Salts A, PA-C  cetirizine (ZYRTEC) 10 MG tablet Take 1 tablet (10 mg total) by mouth daily. 01/03/18   Hoy Register, MD  clotrimazole (LOTRIMIN) 1 %  cream Apply 1 application topically 2 (two) times daily. 07/26/18   Hoy Register, MD  glipiZIDE (GLUCOTROL) 10 MG tablet Take 1 tablet (10 mg total) by mouth 2 (two) times daily before a meal. 07/26/18   Hoy Register, MD  hydrocortisone cream 0.5 % Apply 1 application topically 2 (two) times daily. 04/04/18   Hoy Register, MD  metFORMIN (GLUCOPHAGE) 1000 MG tablet Take orally 3 tabs (1500 mg) in the morning and 2 tabs (1000 mg) in the evening 07/26/18   Hoy Register, MD  methocarbamol (ROBAXIN) 500 MG tablet Take 2 tablets (1,000 mg total) by mouth 3 (three) times daily. 06/14/18   Anders Simmonds, PA-C  mometasone-formoterol (DULERA) 100-5 MCG/ACT AERO Inhale 2 puffs into the lungs 2 (two) times daily. 07/26/18   Hoy Register, MD  naproxen (NAPROSYN) 500 MG tablet Take 1 tablet (500 mg total) by mouth 2 (two) times daily with a meal. 06/14/18   McClung, Marzella Schlein, PA-C  pantoprazole (PROTONIX) 40 MG tablet Take 1 tablet (40 mg total) by mouth daily. 07/26/18   Hoy Register, MD  sitaGLIPtin (JANUVIA) 100 MG tablet Take 1 tablet (100 mg total) by mouth daily. 07/26/18   Hoy Register, MD  traMADol (ULTRAM) 50 MG tablet Take 1 tablet (50 mg total) by mouth every 6 (six) hours as needed. 06/01/18   Fayrene Helper, PA-C    Family History Family History  Problem Relation Age of Onset  . Cancer Mother        cervical/ovarian  . Diabetes Mother   . Diabetes Maternal Uncle   . Diabetes Brother   . Diabetes Maternal Uncle   . Diabetes Maternal Uncle     Social History Social History   Tobacco Use  . Smoking status: Never Smoker  . Smokeless tobacco: Never Used  Substance Use Topics  . Alcohol use: No  . Drug use: No     Allergies   Fruit & vegetable daily [nutritional supplements]   Review of Systems Review of Systems  Constitutional: Negative.  Negative for chills and fever.  Musculoskeletal: Negative.  Negative for arthralgias, joint swelling and myalgias.  Skin: Positive for wound. Negative for color change and rash.  Neurological: Negative.  Negative for weakness and numbness.   Physical Exam Updated Vital Signs BP 107/69 (BP Location: Right Arm)   Pulse 80   Temp (!) 97.5 F (36.4 C)   Resp 20   SpO2 98%   Physical Exam  Constitutional: She is oriented to person, place, and time. She appears well-developed and well-nourished. No distress.  HENT:  Head: Normocephalic and atraumatic.  Right Ear: External ear normal.  Left Ear: External ear normal.  Nose: Nose normal.  Eyes: Pupils are equal, round, and reactive to light. EOM are normal.  Neck: Trachea normal and normal range of motion. No tracheal deviation present.  Pulmonary/Chest: Effort normal. No respiratory distress.    Abdominal: Soft. There is no tenderness. There is no rebound and no guarding.  Musculoskeletal: Normal range of motion.       Left wrist: Normal.       Left forearm: Normal.       Right hand: Normal.       Left hand: She exhibits laceration. She exhibits normal range of motion, no bony tenderness, normal capillary refill, no deformity and no swelling. Normal sensation noted. Normal strength noted.       Hands: Left hand: Linear 1.5 cm laceration to fifth left MCP.  No other abnormality Tenderness  to palpation over only over laceration.  No snuffbox tenderness to palpation. No tenderness to palpation over flexor sheath.  Finger adduction/abduction intact with 5/5 strength.  Thumb opposition intact. Full active and resisted ROM to flexion/extension at wrist, MCP, PIP and DIP of all fingers.  FDS/FDP intact. Grip 5/5 strength.  Radial artery 2+ with <2sec cap refill in all fingers.  Sensation intact to light-tough in median/ulnar/radial distributions.  Neurological: She is alert and oriented to person, place, and time.  Skin: Skin is warm and dry. Capillary refill takes less than 2 seconds.  Psychiatric: She has a normal mood and affect. Her behavior is normal.    ED Treatments / Results  Labs (all labs ordered are listed, but only abnormal results are displayed) Labs Reviewed - No data to display  EKG None  Radiology Dg Hand Complete Left  Result Date: 07/30/2018 CLINICAL DATA:  Lacerations after cutting hand on glass EXAM: LEFT HAND - COMPLETE 3+ VIEW COMPARISON:  None. FINDINGS: Frontal, oblique, and lateral views were obtained. No appreciable fracture or dislocation. Joint spaces appear unremarkable. No erosive change. No soft tissue air or radiopaque foreign body evident. IMPRESSION: No radiopaque foreign body. No fracture or dislocation. No appreciable arthropathic change. Electronically Signed   By: Bretta Bang III M.D.   On: 07/30/2018 19:10    Procedures .Marland KitchenLaceration  Repair Date/Time: 07/30/2018 8:58 PM Performed by: Bill Salinas, PA-C Authorized by: Bill Salinas, PA-C   Consent:    Consent obtained:  Verbal   Consent given by:  Patient   Risks discussed:  Infection, pain, retained foreign body, tendon damage, poor cosmetic result, need for additional repair, nerve damage, poor wound healing and vascular damage Anesthesia (see MAR for exact dosages):    Anesthesia method:  Local infiltration   Local anesthetic:  Lidocaine 1% w/o epi Laceration details:    Location:  Finger   Finger location:  L small finger   Length (cm):  1.5   Depth (mm):  3 Pre-procedure details:    Preparation:  Patient was prepped and draped in usual sterile fashion and imaging obtained to evaluate for foreign bodies Exploration:    Hemostasis achieved with:  Direct pressure   Wound exploration: wound explored through full range of motion and entire depth of wound probed and visualized     Wound extent: no foreign bodies/material noted, no muscle damage noted, no nerve damage noted, no tendon damage noted, no underlying fracture noted and no vascular damage noted     Contaminated: no   Treatment:    Area cleansed with:  Saline   Amount of cleaning:  Extensive   Irrigation solution:  Sterile saline   Irrigation volume:  1 liter   Irrigation method:  Pressure wash Skin repair:    Repair method:  Sutures   Suture size:  4-0   Suture material:  Prolene   Suture technique:  Simple interrupted   Number of sutures:  3 Approximation:    Approximation:  Close Post-procedure details:    Dressing:  Antibiotic ointment, non-adherent dressing and sterile dressing   Patient tolerance of procedure:  Tolerated well, no immediate complications   (including critical care time)  Medications Ordered in ED Medications  lidocaine (PF) (XYLOCAINE) 1 % injection 5 mL (5 mLs Infiltration Given 07/30/18 1815)  acetaminophen (TYLENOL) tablet 650 mg (650 mg Oral Given 07/30/18  2056)     Initial Impression / Assessment and Plan / ED Course  I have reviewed the triage  vital signs and the nursing notes.  Pertinent labs & imaging results that were available during my care of the patient were reviewed by me and considered in my medical decision making (see chart for details).     Aiya Keach is a 42 y.o. female who presents to ED for laceration of the left fifth MCP. Wound thoroughly cleaned in ED today. Wound explored and bottom of wound seen in a bloodless field. Laceration repaired as dictated above. Patient counseled on home wound care. Follow up with PCP/urgent care or return to ER for suture removal in 7 days. Patient was urged to return to the Emergency Department for worsening pain, swelling, expanding erythema especially if it streaks away from the affected area, fever, or for any additional concerns. Patient verbalized understanding of return precautions.    Tdap on file 03/30/2017.  There were no signs of tendon/nerve/muscular or joint capsule involvement on my examination today.  However due to patient's history of diabetes, history of laceration occurring in trashcan and placement of wound on hand I have prescribed the patient prophylactic course of Keflex, 500 mg 4 times daily for 7 days.  I have also strongly encouraged that the patient follow-up with either her primary care provider or here at the emergency department in 72 hours for a wound recheck.   Patient's recent CMP on 05/31/2018 reviewed, normal creatinine and GFR.  Patient is afebrile, not tachycardic, not hypotensive well-appearing and in no acute distress.  At this time there does not appear to be any evidence of an acute emergency medical condition and the patient appears stable for discharge with appropriate outpatient follow up. Diagnosis was discussed with patient who verbalizes understanding of care plan and is agreeable to discharge. I have discussed return precautions with patient and  husband at bedside who verbalize understanding of return precautions. Patient strongly encouraged to follow-up with their PCP. All questions answered.  Stratus video interpreter used throughout this visit including review of discharge instructions.  Note: Portions of this report may have been transcribed using voice recognition software. Every effort was made to ensure accuracy; however, inadvertent computerized transcription errors may still be present. Final Clinical Impressions(s) / ED Diagnoses   Final diagnoses:  Laceration of left little finger without foreign body, nail damage status unspecified, initial encounter    ED Discharge Orders         Ordered    cephALEXin (KEFLEX) 500 MG capsule  4 times daily     07/30/18 2028           Bill Salinas, PA-C 07/30/18 2105    Bill Salinas, PA-C 07/30/18 2149    Eber Hong, MD 07/31/18 [id number]

## 2018-07-30 NOTE — ED Notes (Signed)
Patient transported to X-ray 

## 2018-07-30 NOTE — ED Triage Notes (Signed)
Pt arrives with reports of cutting her left hand/ pinky finger on glass from a picture frame an hour ago

## 2018-07-30 NOTE — Discharge Instructions (Addendum)
You have been diagnosed today with laceration of the left little finger.  At this time there does not appear to be the presence of an emergent medical condition, however there is always the potential for conditions to worsen. Please read and follow the below instructions.  Please return to the Emergency Department immediately for any new or worsening symptoms or if your symptoms do not improve. Please schedule an appointment with your primary care provider for a follow-up visit in 72 hours for wound recheck.  If you are unable to follow-up with your primary care provider you may have your wound recheck done here at the emergency department in 3 days. Please take the antibiotic medication Keflex as prescribed to avoid infection. Your sutures must be removed in 7 days, you may have this done at your primary care provider or here at the emergency department. Please keep the area clean and dry and use antibiotic ointment.  Get help right away if: You develop severe swelling around the injury site. Your pain suddenly increases and is severe. You develop painful lumps near the wound or on skin that is anywhere on your body. You have a red streak going away from your wound. The wound is on your hand or foot and you cannot properly move a finger or toe. The wound is on your hand or foot and you notice that your fingers or toes look pale or bluish.  Please read the additional information packets attached to your discharge summary.  Do not take your medicine if  develop an itchy rash, swelling in your mouth or lips, or difficulty breathing.   Below has been translated using [employer], errors may occur.  A continuacin se ha traducido con [employer], pueden producirse errores.  Hoy le han diagnosticado laceracin del dedo meique izquierdo. En este momento no parece existir la presencia de una afeccin mdica emergente, sin embargo, siempre existe la posibilidad de que las afecciones empeoren. Lea y siga las  instrucciones a continuacin.  1. Regrese al Departamento de Emergencias de inmediato por cualquier sntoma nuevo o que empeore o si sus sntomas no mejoran. 2. Programe una cita con su proveedor de atencin primaria para una visita de seguimiento en 72 horas para la revisin de la herida. Si no puede hacer un seguimiento con su proveedor de Marine scientist, es posible que le realicen una revisin de la herida aqu en el departamento de emergencias en 3 das. 3. Tome el medicamento antibitico Keflex segn lo prescrito para evitar infecciones. 4. Sus suturas deben retirarse en [address]  [address], puede [employer] en su proveedor de atencin primaria o aqu en el departamento de emergencias. 5. Mantenga el rea limpia y seca y use ungento antibitico.  Obtenga ayuda de inmediato si: ? Desarrolla hinchazn severa alrededor del sitio de la lesin. ? Su dolor aumenta repentinamente y es severo. ? Desarrolla bultos dolorosos cerca de la herida o en la piel que se encuentra en cualquier parte de su cuerpo. ? Tiene una raya roja que se aleja de su herida. ? La herida est en su mano o pie y no puede mover adecuadamente un dedo o un dedo del pie. ? La herida est en su mano o pie y nota que sus dedos de manos y pies se ven plidos o azulados.  Lea los paquetes de informacin adicional adjuntos a su resumen de alta.  No tome su medicamento si desarrolla una erupcin cutnea con picazn, hinchazn en la boca o los labios, o dificultad para respirar.

## 2018-08-02 ENCOUNTER — Ambulatory Visit: Payer: Self-pay | Attending: Family Medicine | Admitting: Physician Assistant

## 2018-08-02 VITALS — BP 106/69 | HR 98 | Temp 98.7°F | Resp 18 | Ht 62.0 in | Wt 191.0 lb

## 2018-08-02 DIAGNOSIS — Z09 Encounter for follow-up examination after completed treatment for conditions other than malignant neoplasm: Secondary | ICD-10-CM | POA: Insufficient documentation

## 2018-08-02 DIAGNOSIS — K219 Gastro-esophageal reflux disease without esophagitis: Secondary | ICD-10-CM | POA: Insufficient documentation

## 2018-08-02 DIAGNOSIS — S61209A Unspecified open wound of unspecified finger without damage to nail, initial encounter: Secondary | ICD-10-CM

## 2018-08-02 DIAGNOSIS — Z7984 Long term (current) use of oral hypoglycemic drugs: Secondary | ICD-10-CM | POA: Insufficient documentation

## 2018-08-02 DIAGNOSIS — Z79899 Other long term (current) drug therapy: Secondary | ICD-10-CM | POA: Insufficient documentation

## 2018-08-02 DIAGNOSIS — E119 Type 2 diabetes mellitus without complications: Secondary | ICD-10-CM | POA: Insufficient documentation

## 2018-08-02 DIAGNOSIS — E118 Type 2 diabetes mellitus with unspecified complications: Secondary | ICD-10-CM

## 2018-08-02 DIAGNOSIS — J45909 Unspecified asthma, uncomplicated: Secondary | ICD-10-CM | POA: Insufficient documentation

## 2018-08-02 DIAGNOSIS — X58XXXA Exposure to other specified factors, initial encounter: Secondary | ICD-10-CM | POA: Insufficient documentation

## 2018-08-02 DIAGNOSIS — Z791 Long term (current) use of non-steroidal anti-inflammatories (NSAID): Secondary | ICD-10-CM | POA: Insufficient documentation

## 2018-08-02 DIAGNOSIS — S61217A Laceration without foreign body of left little finger without damage to nail, initial encounter: Secondary | ICD-10-CM | POA: Insufficient documentation

## 2018-08-02 LAB — GLUCOSE, POCT (MANUAL RESULT ENTRY): POC GLUCOSE: 186 mg/dL — AB (ref 70–99)

## 2018-08-02 MED ORDER — GLUCOSE BLOOD VI STRP: 1.0000 | ORAL_STRIP | Freq: Three times a day (TID) | 12 refills | Status: DC

## 2018-08-02 MED ORDER — TRUE METRIX AIR GLUCOSE METER DEVI: 1.0000 | Freq: Three times a day (TID) | 0 refills | Status: DC

## 2018-08-02 MED ORDER — TRUEPLUS LANCETS 28G MISC: 1.0000 | Freq: Three times a day (TID) | 11 refills | Status: DC

## 2018-08-02 NOTE — Progress Notes (Signed)
Patient ID: Alexandra Aguirre, female   DOB: 1976-03-19, 42 y.o.   MRN: [id number]       Alexandra Aguirre, is a 42 y.o. female  WUJ:[id number]  [id number]  DOB - 13-Aug-1976  Subjective:  Chief Complaint and HPI: Alexandra Aguirre is a 42 y.o. female here today for a follow up visitAfter being seen in the ED for finger laceration 07/30/2018.  #3 prolene sutures placed.  She is doing well.  Taking Keflex.  Her son translates.  It is on her non-dominant hand.  No fever.     From ED note: HPI    Stratus video translator used throughout this visit. Alexandra Aguirre is a 42 y.o. female with history of diabetes presenting for laceration of the left fifth MCP that occurred around 5:30 PM this evening.  Patient stuck her hand into her trash can to pull something out when she cut her finger on a broken picture frame.  Patient describes her pain as an immediate moderate intensity sharp sensation that has been constant in severity.  Patient controlled bleeding with paper towel prior to presentation at ED.  From A/P: Alexandra Aguirre is a 42 y.o. female who presents to ED for laceration of the left fifth MCP. Wound thoroughly cleaned in ED today. Wound explored and bottom of wound seen in a bloodless field. Laceration repaired as dictated above. Patient counseled on home wound care. Follow up with PCP/urgent care or return to ER for suture removal in 7 days. Patient was urged to return to the Emergency Department for worsening pain, swelling, expanding erythema especially if it streaks away from the affected area, fever, or for any additional concerns. Patient verbalized understanding of return precautions.  .    ROS:   Constitutional:  No f/c, No night sweats, No unexplained weight loss. EENT:  No vision changes, No blurry vision, No hearing changes. No mouth, throat, or ear problems.  Respiratory: No cough, No SOB Cardiac: No CP, no palpitations GI:  No abd pain, No N/V/D. GU: No Urinary s/sx Musculoskeletal: No  joint pain Neuro: No headache, no dizziness, no motor weakness.  Skin: No rash Endocrine:  No polydipsia. No polyuria.  Psych: Denies SI/HI  No problems updated.  ALLERGIES: Allergies  Allergen Reactions  . Fruit & Vegetable Daily [Nutritional Supplements] Other (See Comments)    "mouth rash" from Avocado, apple, peaches, plums, kiwi    PAST MEDICAL HISTORY: Past Medical History:  Diagnosis Date  . Abnormal Pap smear of cervix 09/24/2013   AGUS  . Asthma   . Diabetes mellitus without complication (HCC)   . Environmental allergies   . GERD (gastroesophageal reflux disease)     MEDICATIONS AT HOME: Prior to Admission medications   Medication Sig Start Date End Date Taking? Authorizing Provider  clotrimazole (LOTRIMIN) 1 % cream Apply 1 application topically 2 (two) times daily. 07/26/18  Yes Hoy Register, MD  albuterol (PROVENTIL HFA;VENTOLIN HFA) 108 (90 Base) MCG/ACT inhaler Inhale 2 puffs into the lungs every 6 (six) hours as needed for wheezing or shortness of breath. 07/26/18   Hoy Register, MD  atorvastatin (LIPITOR) 20 MG tablet Take 1 tablet (20 mg total) by mouth daily. 07/26/18   Hoy Register, MD  busPIRone (BUSPAR) 10 MG tablet Take 1 tablet (10 mg total) by mouth 2 (two) times daily. 06/21/17   Quentin Angst, MD  cephALEXin (KEFLEX) 500 MG capsule Take 1 capsule (500 mg total) by mouth 4 (four) times daily for 7 days. 07/30/18 08/06/18  Olevia Bowens,  Brandon A, PA-C  cetirizine (ZYRTEC) 10 MG tablet Take 1 tablet (10 mg total) by mouth daily. 01/03/18   Hoy RegisterNewlin, Enobong, MD  glipiZIDE (GLUCOTROL) 10 MG tablet Take 1 tablet (10 mg total) by mouth 2 (two) times daily before a meal. 07/26/18   Hoy RegisterNewlin, Enobong, MD  hydrocortisone cream 0.5 % Apply 1 application topically 2 (two) times daily. 04/04/18   Hoy RegisterNewlin, Enobong, MD  metFORMIN (GLUCOPHAGE) 1000 MG tablet Take orally 3 tabs (1500 mg) in the morning and 2 tabs (1000 mg) in the evening 07/26/18   Hoy RegisterNewlin, Enobong, MD    methocarbamol (ROBAXIN) 500 MG tablet Take 2 tablets (1,000 mg total) by mouth 3 (three) times daily. 06/14/18   Anders SimmondsMcClung, Angela M, PA-C  mometasone-formoterol (DULERA) 100-5 MCG/ACT AERO Inhale 2 puffs into the lungs 2 (two) times daily. 07/26/18   Hoy RegisterNewlin, Enobong, MD  naproxen (NAPROSYN) 500 MG tablet Take 1 tablet (500 mg total) by mouth 2 (two) times daily with a meal. 06/14/18   McClung, Marzella SchleinAngela M, PA-C  pantoprazole (PROTONIX) 40 MG tablet Take 1 tablet (40 mg total) by mouth daily. 07/26/18   Hoy RegisterNewlin, Enobong, MD  sitaGLIPtin (JANUVIA) 100 MG tablet Take 1 tablet (100 mg total) by mouth daily. 07/26/18   Hoy RegisterNewlin, Enobong, MD  traMADol (ULTRAM) 50 MG tablet Take 1 tablet (50 mg total) by mouth every 6 (six) hours as needed. 06/01/18   Fayrene Helperran, [place], PA-C     Objective:  EXAM:   Vitals:   08/02/18 1440  BP: 106/69  Pulse: 98  Resp: 18  Temp: 98.7 F (37.1 C)  TempSrc: Oral  SpO2: 98%  Weight: 191 lb (86.6 kg)  Height: 5\' 2"  (1.575 m)    General appearance : A&OX3. NAD. Non-toxic-appearing HEENT: Atraumatic and Normocephalic.  PERRLA. EOM intact.  Chest/Lungs:  Breathing-non-labored, Good air entry bilaterally, breath sounds normal without rales, rhonchi, or wheezing  CVS: S1 S2 regular, no murmurs, gallops, rubs  L hand over dorsum of 5th digit of L hand-healing wound without erythema or induration.  No swelling.  #3 prolene sutures intact.  Sensory and N-V intact.  Strength and ROM ~70% of normal. Psych:  TP linear. J/I WNL. Normal speech. Appropriate eye contact and affect.  Skin:  No Rash  Data Review Lab Results  Component Value Date   HGBA1C 8.5 (A) 06/14/2018   HGBA1C 9.0 (H) 03/09/2018   HGBA1C 8.9 01/03/2018     Assessment & Plan   1. Open wound of finger, initial encounter Continue with keflex and wound care as directed SR in 1 week(9days after sutures bc 10 days recommended over a joint)  2. Type 2 diabetes mellitus with complication, without long-term current  use of insulin (HCC) Work on diet/not controlled.  Continue current regimen.   - Glucose (CBG)  Patient have been counseled extensively about nutrition and exercise  F/up next Wednesday which will be 9 days after sutures were placed.  The patient was given clear instructions to go to ER or return to medical center if symptoms don't improve, worsen or new problems develop. The patient verbalized understanding. The patient was told to call to get lab results if they haven't heard anything in the next week.     Georgian CoAngela McClung, PA-C [facility] [place], [place] [phone]   08/02/2018, 3:00 PM

## 2018-08-08 ENCOUNTER — Ambulatory Visit: Payer: Self-pay | Attending: Family Medicine | Admitting: Physician Assistant

## 2018-08-08 ENCOUNTER — Ambulatory Visit: Payer: Self-pay

## 2018-08-08 ENCOUNTER — Other Ambulatory Visit: Payer: Self-pay

## 2018-08-08 VITALS — BP 110/76 | HR 79 | Temp 98.2°F | Resp 16 | Wt 191.8 lb

## 2018-08-08 DIAGNOSIS — Z7984 Long term (current) use of oral hypoglycemic drugs: Secondary | ICD-10-CM | POA: Insufficient documentation

## 2018-08-08 DIAGNOSIS — K219 Gastro-esophageal reflux disease without esophagitis: Secondary | ICD-10-CM | POA: Insufficient documentation

## 2018-08-08 DIAGNOSIS — Z79899 Other long term (current) drug therapy: Secondary | ICD-10-CM | POA: Insufficient documentation

## 2018-08-08 DIAGNOSIS — Z4802 Encounter for removal of sutures: Secondary | ICD-10-CM

## 2018-08-08 DIAGNOSIS — E118 Type 2 diabetes mellitus with unspecified complications: Secondary | ICD-10-CM

## 2018-08-08 DIAGNOSIS — Z7951 Long term (current) use of inhaled steroids: Secondary | ICD-10-CM | POA: Insufficient documentation

## 2018-08-08 DIAGNOSIS — S61217D Laceration without foreign body of left little finger without damage to nail, subsequent encounter: Secondary | ICD-10-CM

## 2018-08-08 DIAGNOSIS — J45909 Unspecified asthma, uncomplicated: Secondary | ICD-10-CM | POA: Insufficient documentation

## 2018-08-08 DIAGNOSIS — Z791 Long term (current) use of non-steroidal anti-inflammatories (NSAID): Secondary | ICD-10-CM | POA: Insufficient documentation

## 2018-08-08 DIAGNOSIS — E1165 Type 2 diabetes mellitus with hyperglycemia: Secondary | ICD-10-CM

## 2018-08-08 LAB — GLUCOSE, POCT (MANUAL RESULT ENTRY): POC GLUCOSE: 255 mg/dL — AB (ref 70–99)

## 2018-08-08 NOTE — Progress Notes (Signed)
Patient ID: Alexandra Aguirre, female   DOB: 04/14/1976, 42 y.o.   MRN: [id number]     Alexandra Aguirre, is a 42 y.o. female  AOZ:[id number]  NGE:[id number]  DOB - July 26, 1976  Subjective:  Chief Complaint and HPI: Alexandra Aguirre is a 42 y.o. female here today for Suture removal.  See last note and last ED note.  No fever.  No sign of infection.   Less pain than before.  ROM much better.     ROS:   Constitutional:  No f/c, No night sweats, No unexplained weight loss. EENT:  No vision changes, No blurry vision, No hearing changes. No mouth, throat, or ear problems.  Respiratory: No cough, No SOB Cardiac: No CP, no palpitations GI:  No abd pain, No N/V/D. GU: No Urinary s/sx Musculoskeletal: No joint pain Neuro: No headache, no dizziness, no motor weakness.  Skin: No rash Endocrine:  No polydipsia. No polyuria.  Psych: Denies SI/HI  No problems updated.  ALLERGIES: Allergies  Allergen Reactions  . Fruit & Vegetable Daily [Nutritional Supplements] Other (See Comments)    "mouth rash" from Avocado, apple, peaches, plums, kiwi    PAST MEDICAL HISTORY: Past Medical History:  Diagnosis Date  . Abnormal Pap smear of cervix 09/24/2013   AGUS  . Asthma   . Diabetes mellitus without complication (HCC)   . Environmental allergies   . GERD (gastroesophageal reflux disease)     MEDICATIONS AT HOME: Prior to Admission medications   Medication Sig Start Date End Date Taking? Authorizing Provider  albuterol (PROVENTIL HFA;VENTOLIN HFA) 108 (90 Base) MCG/ACT inhaler Inhale 2 puffs into the lungs every 6 (six) hours as needed for wheezing or shortness of breath. 07/26/18  Yes Hoy Register, MD  atorvastatin (LIPITOR) 20 MG tablet Take 1 tablet (20 mg total) by mouth daily. 07/26/18  Yes Hoy Register, MD  busPIRone (BUSPAR) 10 MG tablet Take 1 tablet (10 mg total) by mouth 2 (two) times daily. 06/21/17  Yes Quentin Angst, MD  cetirizine (ZYRTEC) 10 MG tablet Take 1 tablet (10 mg total)  by mouth daily. 01/03/18  Yes Hoy Register, MD  clotrimazole (LOTRIMIN) 1 % cream Apply 1 application topically 2 (two) times daily. 07/26/18  Yes Hoy Register, MD  glipiZIDE (GLUCOTROL) 10 MG tablet Take 1 tablet (10 mg total) by mouth 2 (two) times daily before a meal. 07/26/18  Yes Newlin, Enobong, MD  glucose blood (TRUE METRIX BLOOD GLUCOSE TEST) test strip 1 each by Other route 3 (three) times daily. 08/02/18  Yes Georgian Co M, PA-C  hydrocortisone cream 0.5 % Apply 1 application topically 2 (two) times daily. 04/04/18  Yes Hoy Register, MD  metFORMIN (GLUCOPHAGE) 1000 MG tablet Take orally 3 tabs (1500 mg) in the morning and 2 tabs (1000 mg) in the evening 07/26/18  Yes Newlin, Enobong, MD  methocarbamol (ROBAXIN) 500 MG tablet Take 2 tablets (1,000 mg total) by mouth 3 (three) times daily. 06/14/18  Yes Drucilla Cumber M, PA-C  mometasone-formoterol (DULERA) 100-5 MCG/ACT AERO Inhale 2 puffs into the lungs 2 (two) times daily. 07/26/18  Yes Hoy Register, MD  naproxen (NAPROSYN) 500 MG tablet Take 1 tablet (500 mg total) by mouth 2 (two) times daily with a meal. 06/14/18  Yes Yesha Muchow M, PA-C  pantoprazole (PROTONIX) 40 MG tablet Take 1 tablet (40 mg total) by mouth daily. 07/26/18  Yes Newlin, Odette Horns, MD  sitaGLIPtin (JANUVIA) 100 MG tablet Take 1 tablet (100 mg total) by mouth daily. 07/26/18  Yes Newlin,  Enobong, MD  traMADol (ULTRAM) 50 MG tablet Take 1 tablet (50 mg total) by mouth every 6 (six) hours as needed. 06/01/18  Yes Fayrene Helperran, Bow[place], PA-C  TRUEPLUS LANCETS 28G MISC 1 each by Does not apply route 3 (three) times daily. 08/02/18  Yes Jenafer Winterton M, PA-C  Blood Glucose Monitoring Suppl (TRUE METRIX AIR GLUCOSE METER) DEVI 1 each by Does not apply route 3 (three) times daily. 08/02/18   Anders SimmondsMcClung, Azhia Siefken M, PA-C     Objective:  EXAM:   Vitals:   08/08/18 1016  BP: 110/76  Pulse: 79  Resp: 16  Temp: 98.2 F (36.8 C)  TempSrc: Oral  SpO2: 97%  Weight: 191 lb  12.8 oz (87 kg)    General appearance : A&OX3. NAD. Non-toxic-appearing L hand-wound over 5th metacarpal well healed.  No erythema.  No induration.  #3 sutures removed w/o problem.  S&ROM full and intact.  Normal grip.  N-V intact.   No other exam.   Data Review Lab Results  Component Value Date   HGBA1C 8.5 (A) 06/14/2018   HGBA1C 9.0 (H) 03/09/2018   HGBA1C 8.9 01/03/2018     Assessment & Plan   1. Visit for suture removal done  2. Type 2 diabetes mellitus with complication, without long-term current use of insulin (HCC) Uncontrolled.  Work on diet/exercise as discussed at each visit.   - Glucose (CBG)   Patient have been counseled extensively about nutrition and exercise  Return for next appt with Dr Alvis LemmingsNewlin 10/2018.  The patient was given clear instructions to go to ER or return to medical center if symptoms don't improve, worsen or new problems develop. The patient verbalized understanding. The patient was told to call to get lab results if they haven't heard anything in the next week.     Georgian CoAngela Ariea Rochin, PA-C [facility] [place], [place] [phone]   08/08/2018, 10:41 AM

## 2018-09-20 ENCOUNTER — Encounter: Payer: Self-pay | Admitting: Obstetrics and Gynecology

## 2018-09-20 ENCOUNTER — Ambulatory Visit (INDEPENDENT_AMBULATORY_CARE_PROVIDER_SITE_OTHER): Payer: Self-pay | Admitting: Obstetrics and Gynecology

## 2018-09-20 DIAGNOSIS — N8110 Cystocele, unspecified: Secondary | ICD-10-CM | POA: Insufficient documentation

## 2018-09-20 DIAGNOSIS — N3020 Other chronic cystitis without hematuria: Secondary | ICD-10-CM | POA: Insufficient documentation

## 2018-09-20 MED ORDER — NITROFURANTOIN MONOHYD MACRO 100 MG PO CAPS: 100.0000 mg | ORAL_CAPSULE | Freq: Two times a day (BID) | ORAL | 0 refills | Status: DC

## 2018-09-20 NOTE — Progress Notes (Signed)
Ms Alexandra Aguirre presents with c/o lower abp/pelvic pain for the last 1-2 months. Describes pain as crampy and comes and goes. Also feels a "bugle" in her vaginal. Known H/O cystocele. She denies any urinary IC Sx. No bowel dysfunction either. Has IUD in placed (ParaGard)  Thinks was placed @ 9 yrs ago. U/S 2018 IUD in place Has occ spotting. UTD on pap smear  TSVD x 5 ( Largest 13 #)  Type 2 DM uncontrolled  PE AF VSS Lungs clear Heart RRR Abd soft + BS GU nl EGBUS grade 2 cystocele + Q tip test IUD string noted + bladder tenderness uterus small non tender no adnexal masses or tenderness  A/P Chronic cystitis        Grade 2 cystocele Suspect pt's pain is related to her cystitis. Will treat with Macrobid. Importance of glucose control discussed with pt. Cystocele unchanged from last exam, no treatment needed at this time. F/U in 6 weeks. Pt also instructed to bring IUD card Video interrupter used during today's visit

## 2018-10-27 ENCOUNTER — Emergency Department (HOSPITAL_COMMUNITY)
Admission: EM | Admit: 2018-10-27 | Discharge: 2018-10-27 | Disposition: A | Discharge status: Home / Self Care | Payer: Self-pay | Attending: Emergency Medicine | Admitting: Emergency Medicine

## 2018-10-27 ENCOUNTER — Emergency Department (HOSPITAL_COMMUNITY): Payer: Self-pay

## 2018-10-27 ENCOUNTER — Encounter (HOSPITAL_COMMUNITY): Payer: Self-pay

## 2018-10-27 DIAGNOSIS — R1084 Generalized abdominal pain: Secondary | ICD-10-CM | POA: Insufficient documentation

## 2018-10-27 LAB — CBC
HCT: 41.5 % (ref 36.0–46.0)
Hemoglobin: 13.5 g/dL (ref 12.0–15.0)
MCH: 28.2 pg (ref 26.0–34.0)
MCHC: 32.5 g/dL (ref 30.0–36.0)
MCV: 86.8 fL (ref 80.0–100.0)
Platelets: 339 10*3/uL (ref 150–400)
RBC: 4.78 MIL/uL (ref 3.87–5.11)
RDW: 12.7 % (ref 11.5–15.5)
WBC: 11.5 10*3/uL — ABNORMAL HIGH (ref 4.0–10.5)
nRBC: 0 % (ref 0.0–0.2)

## 2018-10-27 LAB — URINALYSIS, ROUTINE W REFLEX MICROSCOPIC
Bacteria, UA: NONE SEEN
Bilirubin Urine: NEGATIVE
Glucose, UA: >=500 mg/dL — AB
Ketones, ur: NEGATIVE mg/dL
Nitrite: NEGATIVE
Protein, ur: NEGATIVE mg/dL
Specific Gravity, Urine: 1.035 — ABNORMAL HIGH (ref 1.005–1.030)
pH: 5 (ref 5.0–8.0)

## 2018-10-27 LAB — COMPREHENSIVE METABOLIC PANEL
ALT: 21 U/L (ref 0–44)
AST: 19 U/L (ref 15–41)
Albumin: 3.5 g/dL (ref 3.5–5.0)
Alkaline Phosphatase: 120 U/L (ref 38–126)
Anion gap: 12 (ref 5–15)
BUN: 6 mg/dL (ref 6–20)
CO2: 22 mmol/L (ref 22–32)
Calcium: 8.6 mg/dL — ABNORMAL LOW (ref 8.9–10.3)
Chloride: 100 mmol/L (ref 98–111)
Creatinine, Ser: 0.64 mg/dL (ref 0.44–1.00)
GFR calc Af Amer: >60 mL/min (ref >60)
GFR calc non Af Amer: >60 mL/min (ref >60)
Glucose, Bld: 346 mg/dL — ABNORMAL HIGH (ref 70–99)
Potassium: 3.8 mmol/L (ref 3.5–5.1)
Sodium: 134 mmol/L — ABNORMAL LOW (ref 135–145)
Total Bilirubin: 0.7 mg/dL (ref 0.3–1.2)
Total Protein: 7.6 g/dL (ref 6.5–8.1)

## 2018-10-27 LAB — I-STAT BETA HCG BLOOD, ED (MC, WL, AP ONLY): I-stat hCG, quantitative: <5 m[IU]/mL (ref <5)

## 2018-10-27 LAB — LIPASE, BLOOD: Lipase: 27 U/L (ref 11–51)

## 2018-10-27 MED ORDER — HYDROMORPHONE HCL 1 MG/ML IJ SOLN
1.0000 mg | Freq: Once | INTRAMUSCULAR | Status: AC
  Administered 2018-10-27: 1 mg via INTRAVENOUS
  Filled 2018-10-27: qty 1

## 2018-10-27 MED ORDER — SODIUM CHLORIDE 0.9% FLUSH: 3.0000 mL | Freq: Once | INTRAVENOUS | Status: DC

## 2018-10-27 MED ORDER — DICYCLOMINE HCL 10 MG PO CAPS
10.0000 mg | ORAL_CAPSULE | Freq: Once | ORAL | Status: AC
  Administered 2018-10-27: 10 mg via ORAL
  Filled 2018-10-27: qty 1

## 2018-10-27 MED ORDER — DICYCLOMINE HCL 20 MG PO TABS: 20.0000 mg | ORAL_TABLET | Freq: Two times a day (BID) | ORAL | 0 refills | Status: DC

## 2018-10-27 MED ORDER — IOHEXOL 300 MG/ML  SOLN
100.0000 mL | Freq: Once | INTRAMUSCULAR | Status: AC | PRN
  Administered 2018-10-27: 100 mL via INTRAVENOUS

## 2018-10-27 MED ORDER — SODIUM CHLORIDE 0.9 % IV BOLUS
1000.0000 mL | Freq: Once | INTRAVENOUS | Status: AC
  Administered 2018-10-27: 1000 mL via INTRAVENOUS

## 2018-10-27 MED ORDER — KETOROLAC TROMETHAMINE 15 MG/ML IJ SOLN
15.0000 mg | Freq: Once | INTRAMUSCULAR | Status: AC
  Administered 2018-10-27: 15 mg via INTRAVENOUS
  Filled 2018-10-27: qty 1

## 2018-10-27 NOTE — ED Provider Notes (Signed)
[facility] EMERGENCY DEPARTMENT Provider Note   CSN: [id number] Arrival date & time: 10/27/18  1344     History   Chief Complaint Chief Complaint  Patient presents with  . Abdominal Pain  . Sore Throat  . Fever    HPI Alexandra Aguirre is a 43 y.o. female.  HPI   43 year old female with abdominal pain.  Primarily Spanish-speaking.  Son is at bedside interpreting.  Interpreter services offered but she is declining.  She reports a 4-day history of abdominal pain.  Left lower quadrant.  Initially waxed and waned but now is pretty constant she feels like it is worsening.  Worse with certain movements and positions.  No urinary complaints.  Patient took 1 dose of tetracycline that she had at home.  She subsequently broke out in a rash shortly later.  Itches.  It is subsequently improving.  No acute respiratory symptoms.  Past Medical History:  Diagnosis Date  . Abnormal Pap smear of cervix 09/24/2013   AGUS  . Asthma   . Diabetes mellitus without complication (HCC)   . Environmental allergies   . GERD (gastroesophageal reflux disease)     Patient Active Problem List   Diagnosis Date Noted  . Chronic cystitis 09/20/2018  . POP-Q stage 2 cystocele 09/20/2018  . Fatty liver 06/14/2018  . Hyperlipidemia 04/04/2018  . Dental caries 06/21/2017  . Vitamin D deficiency 03/01/2017  . Type 2 diabetes mellitus with complication, without long-term current use of insulin (HCC) 10/12/2016  . Gastroesophageal reflux disease 10/12/2016  . Generalized anxiety disorder 10/12/2016  . Asthma 01/15/2014  . Vertigo 11/25/2013  . History of cervical dysplasia 11/01/2013  . Pedal edema 09/30/2013  . Migraine headache 09/30/2013  . Gastritis and gastroduodenitis 09/30/2013  . Back pain 09/30/2013    Past Surgical History:  Procedure Laterality Date  . CERVICAL BIOPSY  W/ LOOP ELECTRODE EXCISION    . removal of cyst Right 2011     OB History    Gravida  5   Para  5   Term  5   Preterm      AB      Living  5     SAB      TAB      Ectopic      Multiple      Live Births               Home Medications    Prior to Admission medications   Medication Sig Start Date End Date Taking? Authorizing Provider  albuterol (PROVENTIL HFA;VENTOLIN HFA) 108 (90 Base) MCG/ACT inhaler Inhale 2 puffs into the lungs every 6 (six) hours as needed for wheezing or shortness of breath. 07/26/18   Hoy Register, MD  atorvastatin (LIPITOR) 20 MG tablet Take 1 tablet (20 mg total) by mouth daily. 07/26/18   Hoy Register, MD  Blood Glucose Monitoring Suppl (TRUE METRIX AIR GLUCOSE METER) DEVI 1 each by Does not apply route 3 (three) times daily. 08/02/18   Anders Simmonds, PA-C  busPIRone (BUSPAR) 10 MG tablet Take 1 tablet (10 mg total) by mouth 2 (two) times daily. 06/21/17   Quentin Angst, MD  cetirizine (ZYRTEC) 10 MG tablet Take 1 tablet (10 mg total) by mouth daily. 01/03/18   Hoy Register, MD  clotrimazole (LOTRIMIN) 1 % cream Apply 1 application topically 2 (two) times daily. 07/26/18   Hoy Register, MD  glipiZIDE (GLUCOTROL) 10 MG tablet Take 1 tablet (10 mg total) by  mouth 2 (two) times daily before a meal. 07/26/18   Newlin, Enobong, MD  glucose blood (TRUE METRIX BLOOD GLUCOSE TEST) test strip 1 each by Other route 3 (three) times daily. 08/02/18   Anders SimmondsMcClung, Angela M, PA-C  hydrocortisone cream 0.5 % Apply 1 application topically 2 (two) times daily. 04/04/18   Hoy RegisterNewlin, Enobong, MD  metFORMIN (GLUCOPHAGE) 1000 MG tablet Take orally 3 tabs (1500 mg) in the morning and 2 tabs (1000 mg) in the evening 07/26/18   Hoy RegisterNewlin, Enobong, MD  methocarbamol (ROBAXIN) 500 MG tablet Take 2 tablets (1,000 mg total) by mouth 3 (three) times daily. 06/14/18   Anders SimmondsMcClung, Angela M, PA-C  mometasone-formoterol (DULERA) 100-5 MCG/ACT AERO Inhale 2 puffs into the lungs 2 (two) times daily. 07/26/18   Hoy RegisterNewlin, Enobong, MD  naproxen (NAPROSYN) 500 MG tablet Take 1 tablet (500  mg total) by mouth 2 (two) times daily with a meal. 06/14/18   McClung, Marzella SchleinAngela M, PA-C  nitrofurantoin, macrocrystal-monohydrate, (MACROBID) 100 MG capsule Take 1 capsule (100 mg total) by mouth 2 (two) times daily. 09/20/18   Hermina StaggersErvin, Michael L, MD  pantoprazole (PROTONIX) 40 MG tablet Take 1 tablet (40 mg total) by mouth daily. 07/26/18   Hoy RegisterNewlin, Enobong, MD  sitaGLIPtin (JANUVIA) 100 MG tablet Take 1 tablet (100 mg total) by mouth daily. 07/26/18   Hoy RegisterNewlin, Enobong, MD  traMADol (ULTRAM) 50 MG tablet Take 1 tablet (50 mg total) by mouth every 6 (six) hours as needed. 06/01/18   Fayrene Helperran, [place]ie, PA-C  TRUEPLUS LANCETS 28G MISC 1 each by Does not apply route 3 (three) times daily. 08/02/18   Anders SimmondsMcClung, Angela M, PA-C    Family History Family History  Problem Relation Age of Onset  . Cancer Mother        cervical/ovarian  . Diabetes Mother   . Diabetes Maternal Uncle   . Diabetes Brother   . Diabetes Maternal Uncle   . Diabetes Maternal Uncle     Social History Social History   Tobacco Use  . Smoking status: Never Smoker  . Smokeless tobacco: Never Used  Substance Use Topics  . Alcohol use: No  . Drug use: No     Allergies   Fruit & vegetable daily [nutritional supplements]   Review of Systems Review of Systems  All systems reviewed and negative, other than as noted in HPI.  Physical Exam Updated Vital Signs BP 119/75 (BP Location: Right Arm)   Pulse 98   Temp 98.4 F (36.9 C) (Oral)   Resp 20   LMP 09/28/2018   SpO2 97%   Physical Exam Vitals signs and nursing note reviewed.  Constitutional:      General: She is not in acute distress.    Appearance: She is well-developed.  HENT:     Head: Normocephalic and atraumatic.  Eyes:     General:        Right eye: No discharge.        Left eye: No discharge.     Conjunctiva/sclera: Conjunctivae normal.  Neck:     Musculoskeletal: Neck supple.  Cardiovascular:     Rate and Rhythm: Normal rate and regular rhythm.     Heart  sounds: Normal heart sounds. No murmur. No friction rub. No gallop.   Pulmonary:     Effort: Pulmonary effort is normal. No respiratory distress.     Breath sounds: Normal breath sounds.  Abdominal:     General: There is no distension.     Palpations: Abdomen is soft.  Tenderness: There is no abdominal tenderness.     Comments: Tenderness in the left lower quadrant without rebound or guarding.  No distention.  No CVA tenderness.  Musculoskeletal:        General: No tenderness.  Skin:    General: Skin is warm and dry.  Neurological:     Mental Status: She is alert.  Psychiatric:        Behavior: Behavior normal.        Thought Content: Thought content normal.      ED Treatments / Results  Labs (all labs ordered are listed, but only abnormal results are displayed) Labs Reviewed  COMPREHENSIVE METABOLIC PANEL - Abnormal; Notable for the following components:      Result Value   Sodium 134 (*)    Glucose, Bld 346 (*)    Calcium 8.6 (*)    All other components within normal limits  CBC - Abnormal; Notable for the following components:   WBC 11.5 (*)    All other components within normal limits  URINALYSIS, ROUTINE W REFLEX MICROSCOPIC - Abnormal; Notable for the following components:   APPearance HAZY (*)    Specific Gravity, Urine 1.035 (*)    Glucose, UA >=500 (*)    Hgb urine dipstick MODERATE (*)    Leukocytes, UA TRACE (*)    All other components within normal limits  LIPASE, BLOOD  I-STAT BETA HCG BLOOD, ED ([facility], [facility], [facility] ONLY)    EKG None  Radiology No results found.   Ct Abdomen Pelvis W Contrast  Result Date: 10/27/2018 CLINICAL DATA:  Acute left lower quadrant abdominal pain. EXAM: CT ABDOMEN AND PELVIS WITH CONTRAST TECHNIQUE: Multidetector CT imaging of the abdomen and pelvis was performed using the standard protocol following bolus administration of intravenous contrast. CONTRAST:  OMNIPAQUE IOHEXOL 300 MG/ML  SOLN COMPARISON:  CT scan of June 01, 2018. FINDINGS: Lower chest: No acute abnormality. Hepatobiliary: Hepatic steatosis is noted. No gallstones or biliary dilatation is noted. Pancreas: Unremarkable. No pancreatic ductal dilatation or surrounding inflammatory changes. Spleen: Normal in size without focal abnormality. Adrenals/Urinary Tract: Adrenal glands are unremarkable. Kidneys are normal, without renal calculi, focal lesion, or hydronephrosis. Bladder is unremarkable. Stomach/Bowel: Stomach is within normal limits. Appendix appears normal. No evidence of bowel wall thickening, distention, or inflammatory changes. Vascular/Lymphatic: No significant vascular findings are present. No enlarged abdominal or pelvic lymph nodes. Reproductive: Intrauterine device is noted. Otherwise uterus and adnexal regions are unremarkable. Other: No abdominal wall hernia or abnormality. No abdominopelvic ascites. Musculoskeletal: No acute or significant osseous findings. IMPRESSION: Hepatic steatosis. No other significant abnormality seen in the abdomen or pelvis. Electronically Signed   By: Lupita Raider, M.D.   On: 10/27/2018 18:29     Procedures Procedures (including critical care time)  Medications Ordered in ED Medications  HYDROmorphone (DILAUDID) injection 1 mg (1 mg Intravenous Given 10/27/18 1626)  ketorolac (TORADOL) 15 MG/ML injection 15 mg (15 mg Intravenous Given 10/27/18 1625)  sodium chloride 0.9 % bolus 1,000 mL (0 mLs Intravenous Stopped 10/27/18 1656)  iohexol (OMNIPAQUE) 300 MG/ML solution 100 mL (100 mLs Intravenous Contrast Given 10/27/18 1754)  dicyclomine (BENTYL) capsule 10 mg (10 mg Oral Given 10/27/18 1956)     Initial Impression / Assessment and Plan / ED Course  I have reviewed the triage vital signs and the nursing notes.  Pertinent labs & imaging results that were available during my care of the patient were reviewed by me and considered in my medical  decision making (see chart for details).     43 year old female with  left lower quadrant abdominal pain.  Progressively worse over the past 4 days.  Suspect she may have diverticulitis.  She is afebrile and nontoxic in appearance.  She is tender on exam, but has no peritonitis.  No specific urinary complaints and UA pretty unremarkable.  Will CT to further evaluate.  Treat symptoms and reassess.  Final Clinical Impressions(s) / ED Diagnoses   Final diagnoses:  Generalized abdominal pain    ED Discharge Orders    None       Raeford Razor, MD 11/02/18 [phone]

## 2018-10-27 NOTE — Discharge Instructions (Signed)
As discussed, your evaluation today has been largely reassuring.  But, it is important that you monitor your condition carefully, and do not hesitate to return to the ED if you develop new, or concerning changes in your condition. ? ?Otherwise, please follow-up with your physician for appropriate ongoing care. ? ?

## 2018-10-27 NOTE — ED Notes (Signed)
Patient verbalizes understanding of discharge instructions. Opportunity for questioning and answers were provided. Armband removed by staff, pt discharged from ED ambulatory w/ husband and son

## 2018-10-27 NOTE — ED Triage Notes (Signed)
Onset 4 days LLQ abd pain, fever.  Onset yesterday mild sore throat.  No N/V/D.  Pt took Tetraciclina today and broke out in widespread rash and itching.

## 2018-10-27 NOTE — ED Provider Notes (Signed)
I discussed the CT results with the patient, husband, son. CT reassuring. No substantial lab abnormalities, no fever, no distress. Patient notes that she is scheduled to see her physician in 48 hours. With reassuring findings here, with some mild ongoing discomfort, the patient will start a course of Bentyl, and with scheduled follow-up, she is stable for discharge.   Gerhard Munch, MD 10/27/18 1944

## 2018-10-29 ENCOUNTER — Ambulatory Visit: Payer: Self-pay | Attending: Family Medicine | Admitting: Pharmacist

## 2018-10-29 ENCOUNTER — Encounter: Payer: Self-pay | Admitting: Pharmacist

## 2018-10-29 ENCOUNTER — Encounter: Payer: Self-pay | Admitting: Family Medicine

## 2018-10-29 ENCOUNTER — Ambulatory Visit: Payer: Self-pay | Attending: Family Medicine | Admitting: Family Medicine

## 2018-10-29 VITALS — BP 124/71 | HR 77 | Temp 98.1°F | Ht <= 58 in | Wt 190.4 lb

## 2018-10-29 DIAGNOSIS — E785 Hyperlipidemia, unspecified: Secondary | ICD-10-CM | POA: Insufficient documentation

## 2018-10-29 DIAGNOSIS — R21 Rash and other nonspecific skin eruption: Secondary | ICD-10-CM | POA: Insufficient documentation

## 2018-10-29 DIAGNOSIS — K219 Gastro-esophageal reflux disease without esophagitis: Secondary | ICD-10-CM | POA: Insufficient documentation

## 2018-10-29 DIAGNOSIS — Z8041 Family history of malignant neoplasm of ovary: Secondary | ICD-10-CM | POA: Insufficient documentation

## 2018-10-29 DIAGNOSIS — Z833 Family history of diabetes mellitus: Secondary | ICD-10-CM | POA: Insufficient documentation

## 2018-10-29 DIAGNOSIS — J45909 Unspecified asthma, uncomplicated: Secondary | ICD-10-CM | POA: Insufficient documentation

## 2018-10-29 DIAGNOSIS — E118 Type 2 diabetes mellitus with unspecified complications: Secondary | ICD-10-CM | POA: Insufficient documentation

## 2018-10-29 DIAGNOSIS — F411 Generalized anxiety disorder: Secondary | ICD-10-CM | POA: Insufficient documentation

## 2018-10-29 DIAGNOSIS — E1165 Type 2 diabetes mellitus with hyperglycemia: Secondary | ICD-10-CM

## 2018-10-29 DIAGNOSIS — Z79899 Other long term (current) drug therapy: Secondary | ICD-10-CM | POA: Insufficient documentation

## 2018-10-29 DIAGNOSIS — Z794 Long term (current) use of insulin: Secondary | ICD-10-CM | POA: Insufficient documentation

## 2018-10-29 DIAGNOSIS — Z888 Allergy status to other drugs, medicaments and biological substances status: Secondary | ICD-10-CM | POA: Insufficient documentation

## 2018-10-29 DIAGNOSIS — E7800 Pure hypercholesterolemia, unspecified: Secondary | ICD-10-CM | POA: Insufficient documentation

## 2018-10-29 DIAGNOSIS — Z791 Long term (current) use of non-steroidal anti-inflammatories (NSAID): Secondary | ICD-10-CM | POA: Insufficient documentation

## 2018-10-29 LAB — POCT GLYCOSYLATED HEMOGLOBIN (HGB A1C): HBA1C, POC (CONTROLLED DIABETIC RANGE): 8.9 % — AB (ref 0.0–7.0)

## 2018-10-29 LAB — GLUCOSE, POCT (MANUAL RESULT ENTRY): POC GLUCOSE: 274 mg/dL — AB (ref 70–99)

## 2018-10-29 MED ORDER — INSULIN PEN NEEDLE 31G X 5 MM MISC: 1.0000 | Freq: Every day | 5 refills | Status: DC

## 2018-10-29 MED ORDER — TRIAMCINOLONE ACETONIDE 0.1 % EX CREA: 1.0000 "application " | TOPICAL_CREAM | Freq: Two times a day (BID) | CUTANEOUS | 0 refills | Status: DC

## 2018-10-29 MED ORDER — BUSPIRONE HCL 10 MG PO TABS: 10.0000 mg | ORAL_TABLET | Freq: Two times a day (BID) | ORAL | 3 refills | Status: DC

## 2018-10-29 MED ORDER — INSULIN GLARGINE 100 UNIT/ML SOLOSTAR PEN: 10.0000 [IU] | PEN_INJECTOR | Freq: Every day | SUBCUTANEOUS | 3 refills | Status: DC

## 2018-10-29 NOTE — Patient Instructions (Signed)
Insulin Glargine injection  ¿Qué es este medicamento?  La INSULINA GLARGINA es una insulina de origen humano. Este medicamento disminuye la cantidad de azúcar en la sangre. Esta insulina es una insulina de acción prolongada, que normalmente se administra una vez al día.  Este medicamento puede ser utilizado para otros usos; si tiene alguna pregunta consulte con su proveedor de atención médica o con su farmacéutico.  MARCAS COMUNES: BASAGLAR, Lantus, Lantus SoloStar, Toujeo Max SoloStar, Toujeo SoloStar  ¿Qué le debo informar a mi profesional de la salud antes de tomar este medicamento?  Necesitan saber si usted presenta alguno de los siguientes problemas o situaciones:  episodios de bajo nivel de azúcar en la sangre enfermedad ocular, problemas de la visión enfermedad renal enfermedad hepática una reacción alérgica o inusual a la insulina, al metacresol, a otros medicamentos, alimentos, colorantes o conservantes si está embarazada o buscando quedar embarazada si está amamantando a un bebé  ¿Cómo debo utilizar este medicamento?  Este medicamento es para inyección por vía subcutánea. Utilice este medicamento a la misma hora cada día. Utilice exactamente como se le haya indicado. Nunca debe mezclar esta insulina en la misma jeringa con otras insulinas antes de inyección. No agite vigorosamente la insulina antes de usar. Le enseñarán cómo utilizar este medicamento y cómo ajustar las dosis para actividades y enfermedades. No utilice más insulina de la recetada.  Siempre chequee el aspecto de su insulina antes de utilizar. Este medicamento debe ser transparente y sin color como el agua. No lo utilice si está turbio, espeso, coloreado o si tiene partículas sólidas.  Es importante que deseche las agujas y las jeringas usadas en un recipiente resistente a los pinchazos. No las deseche en una basura. Si no tiene un recipiente resistente a los pinchazos, consulte a su farmacéutico o su proveedor de atención para  obtenerlo.  Hable con su pediatra para informarse acerca del uso de este medicamento en niños. Puede requerir atención especial.  Sobredosis: Póngase en contacto inmediatamente con un centro toxicológico o una sala de urgencia si usted cree que haya tomado demasiado medicamento.  ATENCIÓN: Este medicamento es solo para usted. No comparta este medicamento con nadie.  ¿Qué sucede si me olvido de una dosis?  Es importante de no olvidar una dosis. Su profesional de la salud o su médico debe hablar con usted acerca de algún plan para dosis olvidadas. Si olvida una dosis, siga su plan. No use dosis dobles.  ¿Qué puede interactuar con este medicamento?  otros medicamentos para la diabetes  Muchos medicamentos pueden causar cambios en el nivel de azúcar en la sangre. Estos incluyen:  bebidas con alcohol medicamentos antivirales para el VIH o SIDA aspirina y medicamentos tipo aspirina ciertos medicamentos para la presión sanguínea, enfermedad cardiaca y ritmo cardiaco irregular cromo diuréticos hormonas femeninas, tales como estrógenos o progestinas, píldoras anticonceptivas fenofibrato gemfibrozil isoniazida lanreotida hormonas masculinas o esteroides anabólicos IMAO, tales como Carbex, Eldepryl, Marplan, Nardil y Parnate medicamentos para bajar de peso medicamentos para alergias, asma, resfríos o tos medicamentos para la depresión, ansiedad o trastornos psicóticos niacina nicotina AINE, medicamentos para el dolor y la inflamación, como ibuprofeno o naproxeno octreotida pasireotida pentamidina fenitoína probenecida antibióticos del grupo de las quinolonas, tales como ciprofloxacino, levofloxacino y ofloxacino algunos suplementos dietéticos a base de hierbas medicamentos esteroideos, como la prednisona o la cortisona sulfametoxasol; trimetoprima hormonas tiroideas  Algunos medicamentos pueden ocultar los síntomas de advertencia de niveles bajos de azúcar en la sangre (hipoglucemia). Es posible que deba monitorear más  atentamente   su nivel de azúcar en la sangre si está tomando uno de estos medicamentos. Estos medicamentos incluyen:  betabloqueadores, que con frecuencia se usan para la presión sanguínea alta o problemas cardiacos (algunos ejemplos son atenolol, metoprolol y propranolol) clonidina guanetidina reserpina  Puede ser que esta lista no menciona todas las posibles interacciones. Informe a su profesional de la salud de todos los productos a base de hierbas, medicamentos de venta libre o suplementos nutritivos que esté tomando. Si usted fuma, consume bebidas alcohólicas o si utiliza drogas ilegales, indíqueselo también a su profesional de la salud. Algunas sustancias pueden interactuar con su medicamento.  ¿A qué debo estar atento al usar este medicamento?  Visite a su profesional de la salud o a su médico para que revise regularmente su evolución.  No conduzca, no utilice maquinaria ni haga nada que le exija permanecer en estado de alerta hasta que sepa cómo le afecta este medicamento. El alcohol puede interferir con el efecto de este medicamento. Evite consumir bebidas alcohólicas.  Se monitorizará una prueba llamada Hemoglobina A1C (A1C). Es un análisis de sangre sencillo. Mide su control del nivel de azúcar en la sangre durante los últimos 2 a 3 meses. Se le realizará esta prueba cada 3 a 6 meses.  Aprenda a revisar su nivel de azúcar en la sangre. Conozca los síntomas del nivel bajo y elevado de azúcar en la sangre y cómo controlarlos.  Siempre lleve con usted una fuente rápida de azúcar por si tiene síntomas de nivel bajo de azúcar en la sangre. Algunos ejemplos incluyen caramelos duros de azúcar o tabletas de glucosa. Asegúrese de que otras personas sepan que usted se puede ahogar si come o bebe cuando presenta síntomas graves de nivel bajo de azúcar en la sangre, como convulsiones o pérdida del conocimiento. Deben obtener ayuda médica de inmediato.  Informe a su médico o profesional de la salud si tiene niveles  elevados de azúcar en la sangre. Es posible que deba cambiar la dosis de su medicamento. Si está enfermo o hace más ejercicio que lo habitual, es posible que necesite cambiar la dosis de su medicamento.  No saltee comidas. Pregunte a su médico o profesional de la salud si debe evitar el alcohol. Muchos productos de venta libre para la tos y el resfrío contienen azúcar o alcohol. Estos pueden afectar los niveles de azúcar en la sangre.  Asegúrese de tener el tipo correcto de jeringa para el tipo de insulina que utiliza. Intente no cambiar de marca y tipo de insulina o jeringa, a menos que se lo indique su profesional de la salud o médico. Cambiar de marca o tipo de insulina puede causar niveles peligrosamente elevados o bajos de azúcar en la sangre. Siempre tenga a mano un suministro extra de insulina, jeringas y agujas. Use la jeringa solamente una vez. Deseche la jeringa y la aguja en un recipiente cerrado para evitar que alguien se pinche accidentalmente.  Los inyectores y cartuchos de insulina nunca deben compartirse. Incluso si se cambia la aguja, al compartir se pueden contagiar virus como la hepatitis o el VIH.  Cada vez que reciba una nueva caja de agujas de inyector, verifique que sean del mismo tipo de las que está entrenado para usar. Si no es así, pídale a su profesional de la salud que le muestre cómo usar correctamente este nuevo tipo de agujas.  Use un brazalete o una cadena de identificación médica, y lleve con usted una tarjeta que describa su enfermedad, detalles de su medicamento y   horario de dosis.  ¿Qué efectos secundarios puedo tener al utilizar este medicamento?  Efectos secundarios que debe informar a su médico o a su profesional de la salud tan pronto como sea posible:  reacciones alérgicas, como erupción cutánea, picazón o urticarias, e hinchazón de la cara, los labios o la lengua problemas respiratorios signos y síntomas de niveles elevados de azúcar en la sangre, tales como mareo, boca  seca, piel seca, aliento frutal, náuseas, dolor de estómago, aumento del apetito o la sed, aumento de la frecuencia urinaria signos y síntomas de niveles bajos de azúcar en la sangre, tales como ansiedad, confusión, mareos, aumento del apetito, debilidad o cansancio inusuales, sudoración, temblores, frío, irritabilidad, dolor de cabeza, visión borrosa, ritmo cardiaco rápido y pérdida del conocimiento  Efectos secundarios que generalmente no requieren atención médica (infórmelos a su médico o a su profesional de la salud si persisten o si son molestos):  aumento o disminución del tejido adiposo debajo de la piel debido al uso excesivo de un lugar de inyección en particular picazón, ardor, hinchazón o erupción en el lugar de la inyección  Puede ser que esta lista no menciona todos los posibles efectos secundarios. Comuníquese a su médico por asesoramiento médico sobre los efectos secundarios. Usted puede informar los efectos secundarios a la FDA por teléfono al 1-800-FDA-1088.  ¿Dónde debo guardar mi medicina?  Mantenga fuera del alcance de los niños.  Guarde los frascos sin abrir en el refrigerador a una temperatura de entre 2 y 8 grados Celsius (36 y 46 grados Fahrenheit). No congele o utilice si la insulina ha estado congelada. Los frascos abiertos (frascos que esté utilizando actualmente) pueden guardarse en el refrigerador o a temperatura ambiente, aproximadamente a 25 grados Celsius (77 grados Fahrenheit) o más fresco. Mantener su insulina a temperatura ambiente disminuye la cantidad de dolor que experimenta durante la inyección. Una vez abierta, la insulina se puede utilizar por 28 días. Después de 28 días, deseche el frasco.  Guarde los inyectores Lantus Solostar o los inyectores Basaglar KwikPens en el refrigerador a una temperatura de entre 2 y 8 grados Celsius (36 y 46 grados Fahrenheit) o a temperatura ambiente de menos de 30 grados Celsius (86 grados Fahrenheit). No congele o utilice si la insulina ha  estado congelada. Una vez abiertos, los inyectores se deben mantener a temperatura ambiente. No guardar en el refrigerador una vez abiertos. Una vez abierta, la insulina se puede utilizar durante 28 días. Después de 28 días, debe desechar el inyector Lantus Solostar o el inyector Basaglar KwikPen.  Guarde los inyectores Toujeo y Toujeo Max Solostar en el refrigerador a una temperatura de entre 2 y 8 grados Celsius (36 y 46 grados Fahrenheit). No congele o utilice si la insulina ha estado congelada. Una vez abiertos, los inyectores se deben mantener a temperatura ambiente a menos de 30 grados Celsius (86 grados Fahrenheit). No guardar en el refrigerador una vez abiertos. Una vez abierta, la insulina se puede utilizar durante 56 días. Después de 56 días, debe desechar el inyector Toujeo o Toujeo Max Solostar.  Proteja de la luz y del calor excesivo. Deseche todo el medicamento sin usar después de la fecha de vencimiento o después de transcurrido el tiempo específico de almacenamiento a temperatura ambiente.  ATENCIÓN: Este folleto es un resumen. Puede ser que no cubra toda la posible información. Si usted tiene preguntas acerca de esta medicina, consulte con su médico, su farmacéutico o su profesional de la salud.  © 2019 Elsevier/Gold Standard (2017-10-13 00:00:00)

## 2018-10-29 NOTE — Progress Notes (Signed)
Patient was educated on the use of the Lantus pen. Reviewed necessary supplies and operation of the pen. Also reviewed goal blood glucose levels. Patient was able to demonstrate use. All questions and concerns were addressed.  Of note, we also provided DM education. MyPlate discussed in detail. Gave recommendation of obtaining 150 minutes of exercise a week. Reinforced medication adherence. After discussion, pt reports not taking medications consistently and only when she feels symptoms of hyperglycemia. Emphasized that these medications are best taken daily. Risk/benefit of the disease progression of DM and its relation to proper medication adherence discussed thoroughly.   Patient seen with:  Arletha Pili, PharmD Candidate  [employer]  Class of 2022  Butch Penny, PharmD, CPP Clinical Pharmacist [facility] [phone]

## 2018-10-29 NOTE — Progress Notes (Signed)
Subjective:  Patient ID: Alexandra Aguirre, female    DOB: Nov 05, 1975  Age: 43 y.o. MRN: [id number]  CC: Diabetes   HPI Alexandra Aguirre is a 43 year old female with history of type 2 diabetes mellitus (A1c 8.9), hyperlipidemia, GERD, gastritis who presents today for follow-up visit. Her A1c is 8.9 which is up from 8.5 previously and she endorses compliance with her medications.  She has not been compliant with a diabetic diet or with exercise and attributes elevation in blood sugar to the fact that she has been worrying over her son who has been missing from home for the last 7 months.  This has her sad and she has been undergoing counseling with her husband. She has been opposed to commencing an injectable. Reflux symptoms are controlled and she is compliant with her statin.  She complains of a left arm pruritic rash which has been present for the last 4 to 5 days but rash is absent in other body parts.  She states rash improved when she had an ED visit 4 days ago for abdominal pain which she was treated with Bentyl. She has not commenced new foods or used new body products and no one at home has similar symptoms.  Past Medical History:  Diagnosis Date  . Abnormal Pap smear of cervix 09/24/2013   AGUS  . Asthma   . Diabetes mellitus without complication (HCC)   . Environmental allergies   . GERD (gastroesophageal reflux disease)     Past Surgical History:  Procedure Laterality Date  . CERVICAL BIOPSY  W/ LOOP ELECTRODE EXCISION    . removal of cyst Right 2011    Family History  Problem Relation Age of Onset  . Cancer Mother        cervical/ovarian  . Diabetes Mother   . Diabetes Maternal Uncle   . Diabetes Brother   . Diabetes Maternal Uncle   . Diabetes Maternal Uncle     Allergies  Allergen Reactions  . Fruit & Vegetable Daily [Nutritional Supplements] Other (See Comments)    "mouth rash" from Avocado, apple, peaches, plums, kiwi    Outpatient Medications Prior to Visit    Medication Sig Dispense Refill  . albuterol (PROVENTIL HFA;VENTOLIN HFA) 108 (90 Base) MCG/ACT inhaler Inhale 2 puffs into the lungs every 6 (six) hours as needed for wheezing or shortness of breath. 1 Inhaler 2  . atorvastatin (LIPITOR) 20 MG tablet Take 1 tablet (20 mg total) by mouth daily. 30 tablet 6  . Blood Glucose Monitoring Suppl (TRUE METRIX AIR GLUCOSE METER) DEVI 1 each by Does not apply route 3 (three) times daily. 1 Device 0  . cetirizine (ZYRTEC) 10 MG tablet Take 1 tablet (10 mg total) by mouth daily. 30 tablet 3  . clotrimazole (LOTRIMIN) 1 % cream Apply 1 application topically 2 (two) times daily. 30 g 0  . dicyclomine (BENTYL) 20 MG tablet Take 1 tablet (20 mg total) by mouth 2 (two) times daily. 20 tablet 0  . glipiZIDE (GLUCOTROL) 10 MG tablet Take 1 tablet (10 mg total) by mouth 2 (two) times daily before a meal. 60 tablet 6  . glucose blood (TRUE METRIX BLOOD GLUCOSE TEST) test strip 1 each by Other route 3 (three) times daily. 100 each 12  . hydrocortisone cream 0.5 % Apply 1 application topically 2 (two) times daily. 56 g 1  . metFORMIN (GLUCOPHAGE) 1000 MG tablet Take orally 3 tabs (1500 mg) in the morning and 2 tabs (1000 mg) in the evening  150 tablet 6  . methocarbamol (ROBAXIN) 500 MG tablet Take 2 tablets (1,000 mg total) by mouth 3 (three) times daily. 90 tablet 0  . mometasone-formoterol (DULERA) 100-5 MCG/ACT AERO Inhale 2 puffs into the lungs 2 (two) times daily. 13 g 6  . naproxen (NAPROSYN) 500 MG tablet Take 1 tablet (500 mg total) by mouth 2 (two) times daily with a meal. 60 tablet 0  . nitrofurantoin, macrocrystal-monohydrate, (MACROBID) 100 MG capsule Take 1 capsule (100 mg total) by mouth 2 (two) times daily. 35 capsule 0  . pantoprazole (PROTONIX) 40 MG tablet Take 1 tablet (40 mg total) by mouth daily. 30 tablet 6  . sitaGLIPtin (JANUVIA) 100 MG tablet Take 1 tablet (100 mg total) by mouth daily. 30 tablet 6  . traMADol (ULTRAM) 50 MG tablet Take 1 tablet  (50 mg total) by mouth every 6 (six) hours as needed. 15 tablet 0  . TRUEPLUS LANCETS 28G MISC 1 each by Does not apply route 3 (three) times daily. 100 each 11  . busPIRone (BUSPAR) 10 MG tablet Take 1 tablet (10 mg total) by mouth 2 (two) times daily. 60 tablet 3   No facility-administered medications prior to visit.      ROS Review of Systems General: negative for fever, weight loss, appetite change Eyes: no visual symptoms. ENT: no ear symptoms, no sinus tenderness, no nasal congestion or sore throat. Neck: no pain  Respiratory: no wheezing, shortness of breath, cough Cardiovascular: no chest pain, no dyspnea on exertion, no pedal edema, no orthopnea. Gastrointestinal: no abdominal pain, no diarrhea, no constipation Genito-Urinary: no urinary frequency, no dysuria, no polyuria. Hematologic: no bruising Endocrine: no cold or heat intolerance Neurological: no headaches, no seizures, no tremors Musculoskeletal: no joint pains, no joint swelling Skin: no pruritus, +rash. Psychological: no depression, no anxiety,    Objective:  BP 124/71   Pulse 77   Temp 98.1 F (36.7 C) (Oral)   Ht 4\' 2"  (1.27 m)   Wt 190 lb 6.4 oz (86.4 kg)   SpO2 100%   BMI 53.55 kg/m   BP/Weight 10/29/2018 10/27/2018 09/20/2018  Systolic BP 124 111 107  Diastolic BP 71 71 66  Wt. (Lbs) 190.4 - 188.4  BMI 53.55 - 52.98      Physical Exam Constitutional: normal appearing,  Eyes: PERRLA HEENT: Head is atraumatic, normal sinuses, normal oropharynx, normal appearing tonsils and palate, tympanic membrane is normal bilaterally. Neck: normal range of motion, no thyromegaly, no JVD Cardiovascular: normal rate and rhythm, normal heart sounds, no murmurs, rub or gallop, no pedal edema Respiratory: Normal breath sounds, clear to auscultation bilaterally, no wheezes, no rales, no rhonchi Abdomen: soft, not tender to palpation, normal bowel sounds, no enlarged organs Musculoskeletal: Full ROM, no tenderness in  joints Skin: warm and dry, erythematous papules on medial aspect of left arm.  Absence of rash in other body parts Neurological: alert, oriented x3, cranial nerves I-XII grossly intact , normal motor strength, normal sensation. Psychological: normal mood.   CMP Latest Ref Rng & Units 10/27/2018 05/31/2018 11/25/2017  Glucose 70 - 99 mg/dL 161(W346(H) 960(A194(H) 540(J275(H)  BUN 6 - 20 mg/dL 6 7 5(L)  Creatinine 8.110.44 - 1.00 mg/dL 9.140.64 7.820.55 9.560.53  Sodium 135 - 145 mmol/L 134(L) 136 131(L)  Potassium 3.5 - 5.1 mmol/L 3.8 3.5 4.0  Chloride 98 - 111 mmol/L 100 102 99(L)  CO2 22 - 32 mmol/L 22 22 21(L)  Calcium 8.9 - 10.3 mg/dL 2.1(H8.6(L) 9.2 0.8(M8.6(L)  Total Protein 6.5 -  8.1 g/dL 7.6 7.6 7.4  Total Bilirubin 0.3 - 1.2 mg/dL 0.7 0.5 0.9  Alkaline Phos 38 - 126 U/L 120 130(H) 139(H)  AST 15 - 41 U/L 19 30 60(H)  ALT 0 - 44 U/L 21 34 54    Lipid Panel     Component Value Date/Time   CHOL 191 03/09/2018 1147   CHOL 167 03/01/2017 1129   TRIG 190 (H) 03/09/2018 1147   HDL 31 (L) 03/09/2018 1147   HDL 31 (L) 03/01/2017 1129   CHOLHDL 6.2 03/09/2018 1147   VLDL 38 03/09/2018 1147   LDLCALC 122 (H) 03/09/2018 1147   LDLCALC 95 03/01/2017 1129    CBC    Component Value Date/Time   WBC 11.5 (H) 10/27/2018 1404   RBC 4.78 10/27/2018 1404   HGB 13.5 10/27/2018 1404   HGB 13.1 03/01/2017 1129   HCT 41.5 10/27/2018 1404   HCT 41.4 03/01/2017 1129   PLT 339 10/27/2018 1404   PLT 372 03/01/2017 1129   MCV 86.8 10/27/2018 1404   MCV 89 03/01/2017 1129   MCH 28.2 10/27/2018 1404   MCHC 32.5 10/27/2018 1404   RDW 12.7 10/27/2018 1404   RDW 13.5 03/01/2017 1129   LYMPHSABS 2.9 05/31/2018 1925   LYMPHSABS 1.8 03/01/2017 1129   MONOABS 0.8 05/31/2018 1925   EOSABS 0.5 05/31/2018 1925   EOSABS 0.3 03/01/2017 1129   BASOSABS 0.1 05/31/2018 1925   BASOSABS 0.0 03/01/2017 1129    Lab Results  Component Value Date   HGBA1C 8.9 (A) 10/29/2018    Assessment & Plan:   1. Type 2 diabetes mellitus with  complication, without long-term current use of insulin (HCC) Uncontrolled with A1c of 8.9 which has trended up from 8.5 previously Initiate Lantus Clinical pharmacist called in for teaching on insulin administration Counseled on Diabetic diet, my plate method, 119 minutes of moderate intensity exercise/week Keep blood sugar logs with fasting goals of 80-120 mg/dl, random of less than 417 and in the event of sugars less than 60 mg/dl or greater than 408 mg/dl please notify the clinic ASAP. It is recommended that you undergo annual eye exams and annual foot exams. Pneumonia vaccine is recommended. - POCT glucose (manual entry) - POCT glycosylated hemoglobin (Hb A1C) - Insulin Glargine (LANTUS SOLOSTAR) 100 UNIT/ML Solostar Pen; Inject 10 Units into the skin daily.  Dispense: 3 pen; Refill: 3 - Insulin Pen Needle 31G X 5 MM MISC; 1 each by Does not apply route at bedtime.  Dispense: 30 each; Refill: 5  2. Rash Unknown etiology - triamcinolone cream (KENALOG) 0.1 %; Apply 1 application topically 2 (two) times daily.  Dispense: 30 g; Refill: 0  3. Pure hypercholesterolemia Controlled  4. Generalized anxiety disorder Exacerbated by his son who has been missing Currently undergoing therapy - busPIRone (BUSPAR) 10 MG tablet; Take 1 tablet (10 mg total) by mouth 2 (two) times daily.  Dispense: 60 tablet; Refill: 3   Meds ordered this encounter  Medications  . Insulin Glargine (LANTUS SOLOSTAR) 100 UNIT/ML Solostar Pen    Sig: Inject 10 Units into the skin daily.    Dispense:  3 pen    Refill:  3  . Insulin Pen Needle 31G X 5 MM MISC    Sig: 1 each by Does not apply route at bedtime.    Dispense:  30 each    Refill:  5  . triamcinolone cream (KENALOG) 0.1 %    Sig: Apply 1 application topically 2 (two) times daily.  Dispense:  30 g    Refill:  0  . busPIRone (BUSPAR) 10 MG tablet    Sig: Take 1 tablet (10 mg total) by mouth 2 (two) times daily.    Dispense:  60 tablet    Refill:  3     Follow-up: Return in about 3 months (around 01/27/2019) for Follow-up on diabetes mellitus.      Hoy Register, MD, FAAFP. [facility] [place], [place] [phone]   10/29/2018, 10:10 AM

## 2018-11-01 ENCOUNTER — Encounter: Payer: Self-pay | Admitting: Critical Care Medicine

## 2018-11-01 ENCOUNTER — Ambulatory Visit: Payer: Self-pay | Attending: Critical Care Medicine | Admitting: Critical Care Medicine

## 2018-11-01 ENCOUNTER — Telehealth: Payer: Self-pay | Admitting: Family Medicine

## 2018-11-01 VITALS — BP 114/70 | HR 83 | Temp 98.0°F | Resp 18 | Ht 63.0 in | Wt 188.0 lb

## 2018-11-01 DIAGNOSIS — K219 Gastro-esophageal reflux disease without esophagitis: Secondary | ICD-10-CM | POA: Insufficient documentation

## 2018-11-01 DIAGNOSIS — E118 Type 2 diabetes mellitus with unspecified complications: Secondary | ICD-10-CM

## 2018-11-01 DIAGNOSIS — Z79899 Other long term (current) drug therapy: Secondary | ICD-10-CM | POA: Insufficient documentation

## 2018-11-01 DIAGNOSIS — J039 Acute tonsillitis, unspecified: Secondary | ICD-10-CM

## 2018-11-01 DIAGNOSIS — J0100 Acute maxillary sinusitis, unspecified: Secondary | ICD-10-CM | POA: Insufficient documentation

## 2018-11-01 DIAGNOSIS — M79601 Pain in right arm: Secondary | ICD-10-CM

## 2018-11-01 DIAGNOSIS — H66002 Acute suppurative otitis media without spontaneous rupture of ear drum, left ear: Secondary | ICD-10-CM | POA: Insufficient documentation

## 2018-11-01 DIAGNOSIS — H60323 Hemorrhagic otitis externa, bilateral: Secondary | ICD-10-CM | POA: Insufficient documentation

## 2018-11-01 DIAGNOSIS — Z91018 Allergy to other foods: Secondary | ICD-10-CM | POA: Insufficient documentation

## 2018-11-01 DIAGNOSIS — J45909 Unspecified asthma, uncomplicated: Secondary | ICD-10-CM | POA: Insufficient documentation

## 2018-11-01 DIAGNOSIS — E1165 Type 2 diabetes mellitus with hyperglycemia: Secondary | ICD-10-CM | POA: Insufficient documentation

## 2018-11-01 DIAGNOSIS — Z792 Long term (current) use of antibiotics: Secondary | ICD-10-CM | POA: Insufficient documentation

## 2018-11-01 DIAGNOSIS — Z8041 Family history of malignant neoplasm of ovary: Secondary | ICD-10-CM | POA: Insufficient documentation

## 2018-11-01 DIAGNOSIS — Z833 Family history of diabetes mellitus: Secondary | ICD-10-CM | POA: Insufficient documentation

## 2018-11-01 DIAGNOSIS — H6093 Unspecified otitis externa, bilateral: Secondary | ICD-10-CM | POA: Insufficient documentation

## 2018-11-01 DIAGNOSIS — E785 Hyperlipidemia, unspecified: Secondary | ICD-10-CM | POA: Insufficient documentation

## 2018-11-01 DIAGNOSIS — Z794 Long term (current) use of insulin: Secondary | ICD-10-CM | POA: Insufficient documentation

## 2018-11-01 HISTORY — DX: Acute maxillary sinusitis, unspecified: J01.00

## 2018-11-01 HISTORY — DX: Acute suppurative otitis media without spontaneous rupture of ear drum, left ear: H66.002

## 2018-11-01 HISTORY — DX: Acute tonsillitis, unspecified: J03.90

## 2018-11-01 LAB — GLUCOSE, POCT (MANUAL RESULT ENTRY): POC Glucose: 237 mg/dL — AB (ref 70–99)

## 2018-11-01 MED ORDER — PANTOPRAZOLE SODIUM 40 MG PO TBEC: 40.0000 mg | DELAYED_RELEASE_TABLET | Freq: Every day | ORAL | 6 refills | Status: DC

## 2018-11-01 MED ORDER — AMOXICILLIN 500 MG PO CAPS: 500.0000 mg | ORAL_CAPSULE | Freq: Three times a day (TID) | ORAL | 0 refills | Status: AC

## 2018-11-01 MED ORDER — NAPROXEN 500 MG PO TABS: 500.0000 mg | ORAL_TABLET | Freq: Two times a day (BID) | ORAL | 0 refills | Status: DC | PRN

## 2018-11-01 MED ORDER — CIPROFLOXACIN-HYDROCORTISONE 0.2-1 % OT SUSP: 3.0000 [drp] | Freq: Two times a day (BID) | OTIC | 0 refills | Status: DC

## 2018-11-01 MED ORDER — METFORMIN HCL 500 MG PO TABS: ORAL_TABLET | ORAL | Status: DC

## 2018-11-01 NOTE — Assessment & Plan Note (Signed)
Acute tonsillitis  Again we will prescribe amoxicillin for both ear sinus and tonsillitis areas

## 2018-11-01 NOTE — Progress Notes (Signed)
Subjective:    Patient ID: Alexandra Aguirre, female    DOB: 03/11/1976, 43 y.o.   MRN: [id number]  43 y.o.F with several day onset of severe sore throat, bilateral ear pain, and fever. Hx T2DM, GERD, asthma, Migraine, HLD, Vit D def  The patient had gone to the emergency room on February 8 for abdominal pain and had an uneventful abdominal CT scan performed which was unremarkable.  She states now she is having a hard time swallowing and increased pain in both ears.  Notes started throat pain, ear pain, then fever,  Ibuprofen took but not able swallow, blood in throat.    Past Medical History:  Diagnosis Date  . Abnormal Pap smear of cervix 09/24/2013   AGUS  . Asthma   . Diabetes mellitus without complication (HCC)   . Environmental allergies   . GERD (gastroesophageal reflux disease)      Family History  Problem Relation Age of Onset  . Cancer Mother        cervical/ovarian  . Diabetes Mother   . Diabetes Maternal Uncle   . Diabetes Brother   . Diabetes Maternal Uncle   . Diabetes Maternal Uncle      Social History   Socioeconomic History  . Marital status: Single    Spouse name: Not on file  . Number of children: Not on file  . Years of education: Not on file  . Highest education level: Not on file  Occupational History  . Not on file  Social Needs  . Financial resource strain: Not on file  . Food insecurity:    Worry: Not on file    Inability: Not on file  . Transportation needs:    Medical: Not on file    Non-medical: Not on file  Tobacco Use  . Smoking status: Never Smoker  . Smokeless tobacco: Never Used  Substance and Sexual Activity  . Alcohol use: No  . Drug use: No  . Sexual activity: Yes    Birth control/protection: I.U.D.  Lifestyle  . Physical activity:    Days per week: Not on file    Minutes per session: Not on file  . Stress: Not on file  Relationships  . Social connections:    Talks on phone: Not on file    Gets together: Not on  file    Attends religious service: Not on file    Active member of club or organization: Not on file    Attends meetings of clubs or organizations: Not on file    Relationship status: Not on file  . Intimate partner violence:    Fear of current or ex partner: Not on file    Emotionally abused: Not on file    Physically abused: Not on file    Forced sexual activity: Not on file  Other Topics Concern  . Not on file  Social History Narrative  . Not on file     Allergies  Allergen Reactions  . Fruit & Vegetable Daily [Nutritional Supplements] Other (See Comments)    "mouth rash" from Avocado, apple, peaches, plums, kiwi     Outpatient Medications Prior to Visit  Medication Sig Dispense Refill  . albuterol (PROVENTIL HFA;VENTOLIN HFA) 108 (90 Base) MCG/ACT inhaler Inhale 2 puffs into the lungs every 6 (six) hours as needed for wheezing or shortness of breath. 1 Inhaler 2  . atorvastatin (LIPITOR) 20 MG tablet Take 1 tablet (20 mg total) by mouth daily. 30 tablet 6  .  Blood Glucose Monitoring Suppl (TRUE METRIX AIR GLUCOSE METER) DEVI 1 each by Does not apply route 3 (three) times daily. 1 Device 0  . dicyclomine (BENTYL) 20 MG tablet Take 1 tablet (20 mg total) by mouth 2 (two) times daily. 20 tablet 0  . glipiZIDE (GLUCOTROL) 10 MG tablet Take 1 tablet (10 mg total) by mouth 2 (two) times daily before a meal. 60 tablet 6  . glucose blood (TRUE METRIX BLOOD GLUCOSE TEST) test strip 1 each by Other route 3 (three) times daily. 100 each 12  . Insulin Glargine (LANTUS SOLOSTAR) 100 UNIT/ML Solostar Pen Inject 10 Units into the skin daily. 3 pen 3  . Insulin Pen Needle 31G X 5 MM MISC 1 each by Does not apply route at bedtime. 30 each 5  . methocarbamol (ROBAXIN) 500 MG tablet Take 2 tablets (1,000 mg total) by mouth 3 (three) times daily. (Patient taking differently: Take 1,000 mg by mouth 3 (three) times daily as needed. ) 90 tablet 0  . mometasone-formoterol (DULERA) 100-5 MCG/ACT AERO  Inhale 2 puffs into the lungs 2 (two) times daily. 13 g 6  . sitaGLIPtin (JANUVIA) 100 MG tablet Take 1 tablet (100 mg total) by mouth daily. 30 tablet 6  . triamcinolone cream (KENALOG) 0.1 % Apply 1 application topically 2 (two) times daily. 30 g 0  . TRUEPLUS LANCETS 28G MISC 1 each by Does not apply route 3 (three) times daily. 100 each 11  . clotrimazole (LOTRIMIN) 1 % cream Apply 1 application topically 2 (two) times daily. 30 g 0  . hydrocortisone cream 0.5 % Apply 1 application topically 2 (two) times daily. 56 g 1  . metFORMIN (GLUCOPHAGE) 1000 MG tablet Take orally 3 tabs (1500 mg) in the morning and 2 tabs (1000 mg) in the evening 150 tablet 6  . nitrofurantoin, macrocrystal-monohydrate, (MACROBID) 100 MG capsule Take 1 capsule (100 mg total) by mouth 2 (two) times daily. 35 capsule 0  . traMADol (ULTRAM) 50 MG tablet Take 1 tablet (50 mg total) by mouth every 6 (six) hours as needed. 15 tablet 0  . busPIRone (BUSPAR) 10 MG tablet Take 1 tablet (10 mg total) by mouth 2 (two) times daily. (Patient not taking: Reported on 11/01/2018) 60 tablet 3  . cetirizine (ZYRTEC) 10 MG tablet Take 1 tablet (10 mg total) by mouth daily. (Patient not taking: Reported on 11/01/2018) 30 tablet 3  . fluconazole (DIFLUCAN) 150 MG tablet Take 1 tablet by mouth daily.  0  . naproxen (NAPROSYN) 500 MG tablet Take 1 tablet (500 mg total) by mouth 2 (two) times daily with a meal. (Patient not taking: Reported on 11/01/2018) 60 tablet 0  . pantoprazole (PROTONIX) 40 MG tablet Take 1 tablet (40 mg total) by mouth daily. (Patient not taking: Reported on 11/01/2018) 30 tablet 6   No facility-administered medications prior to visit.     Review of Systems  Constitutional: Positive for appetite change and fever.  HENT: Positive for dental problem, ear pain, sinus pressure, sore throat and trouble swallowing. Negative for ear discharge, nosebleeds, postnasal drip, rhinorrhea and sinus pain.   Eyes: Negative for pain,  discharge, redness and itching.  Respiratory: Negative for cough, chest tightness, shortness of breath and wheezing.   Cardiovascular: Negative for chest pain, palpitations and leg swelling.  Gastrointestinal: Positive for abdominal pain. Negative for diarrhea.       Hx GERD:  Notes heartburn   Genitourinary: Negative.        Objective:  Physical Exam Vitals:   11/01/18 1418  BP: 114/70  Pulse: 83  Resp: 18  Temp: 98 F (36.7 C)  TempSrc: Oral  SpO2: 98%  Weight: 188 lb (85.3 kg)  Height: 5\' 3"  (1.6 m)    Gen: Pleasant, well-nourished, in no distress,  normal affect  ENT: Bilateral external otitis with left worse than right ear, bilateral tonsillar enlargement with acute inflammation and posterior pharyngeal inflammation, bilateral nasal purulence Note the left tympanic membrane is bulging and has dullness suggestive of otitis media  Neck: No JVD, no TMG, no carotid bruits  Lungs: No use of accessory muscles, no dullness to percussion, clear without rales or rhonchi  Cardiovascular: RRR, heart sounds normal, no murmur or gallops, no peripheral edema  Abdomen: soft and NT, no HSM,  BS normal  Musculoskeletal: No deformities, no cyanosis or clubbing  Neuro: alert, non focal  Skin: Warm, no lesions or rashes  Strep throat screen is negative  Abd Ct:   FINDINGS: Lower chest: No acute abnormality.  Hepatobiliary: Hepatic steatosis is noted. No gallstones or biliary dilatation is noted.  Pancreas: Unremarkable. No pancreatic ductal dilatation or surrounding inflammatory changes.  Spleen: Normal in size without focal abnormality.  Adrenals/Urinary Tract: Adrenal glands are unremarkable. Kidneys are normal, without renal calculi, focal lesion, or hydronephrosis. Bladder is unremarkable.  Stomach/Bowel: Stomach is within normal limits. Appendix appears normal. No evidence of bowel wall thickening, distention, or inflammatory changes.  Vascular/Lymphatic:  No significant vascular findings are present. No enlarged abdominal or pelvic lymph nodes.  Reproductive: Intrauterine device is noted. Otherwise uterus and adnexal regions are unremarkable.  Other: No abdominal wall hernia or abnormality. No abdominopelvic ascites.  Musculoskeletal: No acute or significant osseous findings.  IMPRESSION: Hepatic steatosis. No other significant abnormality seen in the abdomen or pelvis.        Assessment & Plan:  I personally reviewed all images and lab data [facility] as well as any outside material available during this office visit and agree with the  radiology impressions.   Non-recurrent acute suppurative otitis media of left ear without spontaneous rupture of tympanic membrane Otitis media of the left ear without rupture of membrane  Will prescribe amoxicillin 500 mg 3 times daily for 7 days  Acute hemorrhagic otitis externa of both ears Acute hemorrhagic otitis externa of both ears  Will prescribe Cipro hydrocortisone eardrops 3 drops twice daily each ear  Type 2 diabetes mellitus with complication, without long-term current use of insulin (HCC) Diabetes poorly controlled secondary to acute inflammation  Continue current diabetic program  Acute non-recurrent maxillary sinusitis Maxillary sinusitis  Will prescribe amoxicillin for 7 days  Acute tonsillitis Acute tonsillitis  Again we will prescribe amoxicillin for both ear sinus and tonsillitis areas  Note Naprosyn also prescribed for the inflammatory component of the throat and ears as needed for pain control  Donzetta Mattersorilda was seen today for hospitalization follow-up.  Diagnoses and all orders for this visit:  Acute tonsillitis, unspecified etiology  Type 2 diabetes mellitus with complication, without long-term current use of insulin (HCC) -     Glucose (CBG) -     metFORMIN (GLUCOPHAGE) 500 MG tablet; Take orally 3 tabs (1500 mg) in the morning and 2 tabs (1000  mg) in the evening  Acute hemorrhagic otitis externa of both ears  Non-recurrent acute suppurative otitis media of left ear without spontaneous rupture of tympanic membrane  Acute non-recurrent maxillary sinusitis  Gastroesophageal reflux disease, esophagitis presence not specified -  pantoprazole (PROTONIX) 40 MG tablet; Take 1 tablet (40 mg total) by mouth daily.   Other orders -     amoxicillin (AMOXIL) 500 MG capsule; Take 1 capsule (500 mg total) by mouth 3 (three) times daily for 7 days. -     ciprofloxacin-hydrocortisone (CIPRO HC) OTIC suspension; Place 3 drops into both ears 2 (two) times daily.

## 2018-11-01 NOTE — Assessment & Plan Note (Signed)
Maxillary sinusitis  Will prescribe amoxicillin for 7 days

## 2018-11-01 NOTE — Telephone Encounter (Signed)
Patient was scheduled an appt

## 2018-11-01 NOTE — Patient Instructions (Addendum)
Begin amoxicillin 1 tablet 3 times daily Begin eardrops 3 drops in each ear twice daily Use Naprosyn twice daily as needed for throat and ear pain You can gargle with warm salt water to help with throat pain You can also use Tylenol as needed for pain Also begin Protonix 1 daily 30 minutes before breakfast and then eat refills were sent to your pharmacy Keep your next scheduled visit with Dr. Alvis Lemmings otherwise call us if you are not improving

## 2018-11-01 NOTE — Assessment & Plan Note (Signed)
Gastroreflux reflux disease with an acute flare  We will refill Protonix

## 2018-11-01 NOTE — Assessment & Plan Note (Signed)
Acute hemorrhagic otitis externa of both ears  Will prescribe Cipro hydrocortisone eardrops 3 drops twice daily each ear

## 2018-11-01 NOTE — Assessment & Plan Note (Signed)
Otitis media of the left ear without rupture of membrane  Will prescribe amoxicillin 500 mg 3 times daily for 7 days

## 2018-11-01 NOTE — Telephone Encounter (Signed)
Alexandra Aguirre can you schedule an appointment for her?

## 2018-11-01 NOTE — Telephone Encounter (Signed)
Patient called because she has severe throat pain and cannot drink water and has a  fever and has blood coming out of her throat. Patient states she can't even feel her saliva.   Patient was advised to go to the [facility] or ED

## 2018-11-01 NOTE — Assessment & Plan Note (Signed)
Diabetes poorly controlled secondary to acute inflammation  Continue current diabetic program

## 2018-11-07 ENCOUNTER — Ambulatory Visit: Payer: Self-pay

## 2018-11-09 ENCOUNTER — Ambulatory Visit (INDEPENDENT_AMBULATORY_CARE_PROVIDER_SITE_OTHER): Payer: Self-pay | Admitting: Obstetrics and Gynecology

## 2018-11-09 ENCOUNTER — Encounter: Payer: Self-pay | Admitting: Obstetrics and Gynecology

## 2018-11-09 DIAGNOSIS — Z975 Presence of (intrauterine) contraceptive device: Secondary | ICD-10-CM | POA: Insufficient documentation

## 2018-11-09 DIAGNOSIS — N3020 Other chronic cystitis without hematuria: Secondary | ICD-10-CM

## 2018-11-09 NOTE — Patient Instructions (Signed)
[place] (Health Maintenance, Female) Un estilo de vida saludable y los cuidados preventivos pueden favorecer considerablemente a la salud y Musician. Pregunte a su mdico cul es el cronograma de exmenes peridicos apropiado para usted. Esta es una buena oportunidad para consultarlo sobre cmo prevenir enfermedades y [place] sano. Adems de los controles, hay muchas otras cosas que puede hacer usted mismo. Los expertos han realizado numerosas investigaciones [employer] cambios en el estilo de vida y las medidas de prevencin que, [place], lo ayudarn a mantenerse sano. Solicite a su mdico ms informacin. EL PESO Y LA DIETA Consuma una dieta saludable.  Asegrese de [employer] verduras, frutas, productos lcteos de bajo contenido de [place] y Advertising account planner.  No consuma muchos alimentos de alto contenido de grasas slidas, azcares agregados o sal.  Realice actividad fsica con regularidad. Esta es una de las prcticas ms importantes que puede hacer por su salud. ? La Delorise Shiner de los adultos deben hacer ejercicio durante al menos 124mnutos por semana. El ejercicio debe aumentar la frecuencia cardaca y pActorla transpiracin (ejercicio de i[place]. ? La mayora de los adultos tambin deben hacer ejercicios de elongacin al mToysRusveces a la semana. Agregue esto al su plan de ejercicio de intensidad moderada. Mantenga un peso saludable.  El ndice de masa corporal ([facility] es una medida que puede utilizarse para identificar posibles problemas de pEast Uniontown Proporciona una estimacin de la grasa corporal basndose en el peso y la altura. Su mdico puede ayudarle a dRadiation protection practitionerISouth Endy a lScientist, forensico mTheatre managerun peso saludable.  Para las mujeres de 20aos o ms: ? Un IJohn R. Oishei Children'S Hospitalmenor de 18,5 se considera bajo peso. ? Un ICumberland County Hospitalentre 18,5 y 24,9 es normal. ? Un IPelham Medical Centerentre 25 y 29,9 se considera sobrepeso. ? Un IMC de 30 o ms se considera  obesidad. Observe los niveles de colesterol y lpidos en la sangre.  Debe comenzar a rEnglish as a second language teacherde lpidos y cResearch officer, trade unionen la sangre a los 20aos y luego repetirlos cada 516aos  Es posible que nAutomotive engineerlos niveles de colesterol con mayor frecuencia si: ? Sus niveles de lpidos y colesterol son altos. ? Es mayor de 527CWC ? Presenta un alto riesgo de padecer enfermedades cardacas. DETECCIN DE CNCER Cncer de pulmn  Se recomienda realizar exmenes de deteccin de cncer de pulmn a personas adultas entre 574y 892aos que estn en riesgo de dHorticulturist, commercialde pulmn por sus antecedentes de consumo de tabaco.  Se recomienda una tomografa computarizada de baja dosis de los pulmones todos los aos a las personas que: ? Fuman actualmente. ? Hayan dejado el hbito en algn momento en los ltimos 15aos. ? Hayan fumado durante 30aos un paquete diario. Un paquete-ao equivale a fumar un promedio de un paquete de cigarrillos diario durante un ao.  Los exmenes de deteccin anuales deben continuar hasta que hayan pasado 15aos desde que dej de fumar.  Ya no debern realizarse si tiene un problema de salud que le impida recibir tratamiento para eScience writerde pulmn. Cncer de mama  Practique la autoconciencia de la mama. Esto significa reconocer la apariencia normal de sus mamas y cmo las siente.  Tambin significa realizar autoexmenes regulares de lJohnson & Johnson Informe a su mdico sobre cualquier cambio, sin importar cun pequeo sea.  Si tiene entre 20 y 363aos, un mdico debe realizarle un examen clnico de las mamas como parte del examen regular de s[place] cada 1 a  3aos.  Si tiene 40aos o ms, debe realizarse un examen clnico de las mamas todos los aos. Tambin considere realizarse una radiografa de las mamas (mamografa) todos los aos.  Si tiene antecedentes familiares de cncer de mama, hable con su mdico para someterse a un estudio gentico.  Si  tiene alto riesgo de padecer cncer de mama, hable con su mdico para someterse a una resonancia magntica y una mamografa todos los aos.  La evaluacin del gen del cncer de mama (BRCA) se recomienda a mujeres que tengan familiares con cnceres relacionados con el BRCA. Los cnceres relacionados con el BRCA incluyen los siguientes: ? Mama. ? Ovario. ? Trompas. ? Cnceres de peritoneo.  Los resultados de la evaluacin determinarn la necesidad de asesoramiento gentico y de anlisis de BRCA1 y BRCA2. Cncer de cuello del tero El mdico puede recomendarle que se haga pruebas peridicas de deteccin de cncer de los rganos de la pelvis (ovarios, tero y vagina). Estas pruebas incluyen un examen plvico, que abarca controlar si se produjeron cambios microscpicos en la superficie del cuello del tero (prueba de Papanicolaou). Pueden recomendarle que se haga estas pruebas cada 3aos, a partir de los 21aos.  A las mujeres que tienen entre 30 y 65aos, los mdicos pueden recomendarles que se sometan a exmenes plvicos y pruebas de Papanicolaou cada 3aos, o a la prueba de Papanicolaou y el examen plvico en combinacin con estudios de deteccin del virus del papiloma humano (VPH) cada 5aos. Algunos tipos de VPH aumentan el riesgo de padecer cncer de cuello del tero. La prueba para la deteccin del VPH tambin puede realizarse a mujeres de cualquier edad cuyos resultados de la prueba de Papanicolaou no sean claros.  Es posible que otros mdicos no recomienden exmenes de deteccin a mujeres no embarazadas que se consideran sujetos de bajo riesgo de padecer cncer de pelvis y que no tienen sntomas. Pregntele al mdico si un examen plvico de deteccin es adecuado para usted.  Si ha recibido un tratamiento para el cncer cervical o una enfermedad que podra causar cncer, necesitar realizarse una prueba de Papanicolaou y controles durante al menos 20 aos de concluido el tratamiento. Si no se  ha hecho el Papanicolaou con regularidad, debern volver a evaluarse los factores de riesgo (como tener un nuevo compaero sexual), para determinar si debe realizarse los estudios nuevamente. Algunas mujeres sufren problemas mdicos que aumentan la probabilidad de contraer cncer de cuello del tero. En estos casos, el mdico podr indicar que se realicen controles y pruebas de Papanicolaou con ms frecuencia. Cncer colorrectal  Este tipo de cncer puede detectarse y a menudo prevenirse.  Por lo general, los estudios de rutina se deben comenzar a hacer a partir de los 50 aos y hasta los 75 aos.  Sin embargo, el mdico podr aconsejarle que lo haga antes, si tiene factores de riesgo para el cncer de colon.  Tambin puede recomendarle que use un kit de prueba para hallar sangre oculta en la materia fecal.  Es posible que se use una pequea cmara en el extremo de un tubo para examinar directamente el colon (sigmoidoscopia o colonoscopia) a fin de detectar formas tempranas de cncer colorrectal.  Los exmenes de rutina generalmente comienzan a los 50aos.  El examen directo del colon se debe repetir cada 5 a 10aos hasta los 75aos. Sin embargo, es posible que se realicen exmenes con mayor frecuencia, si se detectan formas tempranas de plipos precancerosos o pequeos bultos. Cncer de piel  Revise la piel   de la cabeza a los pies con regularidad.  Informe a su mdico si aparecen nuevos lunares o los que tiene se modifican, especialmente en su forma y color.  Tambin notifique al mdico si tiene un lunar que es ms grande que el tamao de una goma de lpiz.  Siempre use pantalla solar. Aplique pantalla solar de manera libre y repetida a lo largo del da.  Protjase usando mangas y pantalones largos, un sombrero de ala ancha y gafas para el sol, siempre que se encuentre en el exterior. ENFERMEDADES CARDACAS, DIABETES E HIPERTENSIN ARTERIAL  La hipertensin arterial causa  enfermedades cardacas y aumenta el riesgo de ictus. La hipertensin arterial es ms probable en los siguientes casos: ? Las personas que tienen la presin arterial en el extremo del rango normal (100-139/85-89 mm Hg). ? Las personas con sobrepeso u obesidad. ? Las personas afroamericanas.  Si usted tiene entre 18 y 39 aos, debe medirse la presin arterial cada 3 a 5 aos. Si usted tiene 40 aos o ms, debe medirse la presin arterial todos los aos. Debe medirse la presin arterial dos veces: una vez cuando est en un hospital o una clnica y la otra vez cuando est en otro sitio. Registre el promedio de las dos mediciones. Para controlar su presin arterial cuando no est en un hospital o una clnica, puede usar lo siguiente: ? Una mquina automtica para medir la presin arterial en una farmacia. ? Un monitor para medir la presin arterial en el hogar.  Si tiene entre 55 y 79 aos, consulte a su mdico si debe tomar aspirina para prevenir el ictus.  Realcese exmenes de deteccin de la diabetes con regularidad. Esto incluye la toma de una muestra de sangre para controlar el nivel de azcar en la sangre durante el ayuno. ? Si tiene un peso normal y un bajo riesgo de padecer diabetes, realcese este anlisis cada tres aos despus de los 45aos. ? Si tiene sobrepeso y un alto riesgo de padecer diabetes, considere someterse a este anlisis antes o con mayor frecuencia. PREVENCIN DE INFECCIONES HepatitisB  Si tiene un riesgo ms alto de contraer hepatitis B, debe someterse a un examen de deteccin de este virus. Se considera que tiene un alto riesgo de contraer hepatitis B si: ? Naci en un pas donde la hepatitis B es frecuente. Pregntele a su mdico qu pases son considerados de alto riesgo. ? Sus padres nacieron en un pas de alto riesgo y usted no recibi una vacuna que lo proteja contra la hepatitis B (vacuna contra la hepatitis B). ? Tiene VIH o sida. ? Usa agujas para inyectarse  drogas. ? Vive con alguien que tiene hepatitis B. ? Ha tenido sexo con alguien que tiene hepatitis B. ? Recibe tratamiento de hemodilisis. ? Toma ciertos medicamentos para el cncer, trasplante de rganos y afecciones autoinmunitarias. Hepatitis C  Se recomienda un anlisis de sangre para: ? Todos los que nacieron entre 1945 y 1965. ? Todas las personas que tengan un riesgo de haber contrado hepatitis C. Enfermedades de transmisin sexual (ETS).  Debe realizarse pruebas de deteccin de enfermedades de transmisin sexual (ETS), incluidas gonorrea y clamidia si: ? Es sexualmente activo y es menor de 24aos. ? Es mayor de 24aos, y el mdico le informa que corre riesgo de tener este tipo de infecciones. ? La actividad sexual ha cambiado desde que le hicieron la ltima prueba de deteccin y tiene un riesgo mayor de tener clamidia o gonorrea. Pregntele al mdico si usted   tiene riesgo.  Si no tiene el VIH, pero corre riesgo de infectarse por el virus, se recomienda tomar diariamente un medicamento recetado para evitar la infeccin. Esto se conoce como profilaxis previa a la exposicin. Se considera que est en riesgo si: ? Es [place] sexualmente y no [place] preservativos habitualmente o no conoce el estado del VIH de sus Advertising copywriter. ? Se inyecta drogas. ? Es [place] sexualmente con Ardelia Mems pareja que tiene VIH. Consulte a su mdico para saber si tiene un alto riesgo de infectarse por el VIH. Si opta por comenzar la profilaxis previa a la exposicin, primero debe realizarse anlisis de deteccin del VIH. Luego, le harn anlisis cada 54mses mientras est tomando los medicamentos para la profilaxis previa a la exposicin. EJeff[facility] Si es premenopusica y puede quedar eArbutus solicite a su mdico asesoramiento previo a la concepcin.  Si puede quedar embarazada, tome 400 a [id number]([id number]) de cido fAnheuser-Busch  Si desea evitar el embarazo, hable con su mdico sobre el  control de la natalidad (anticoncepcin). OSTEOPOROSIS Y MENOPAUSIA  La osteoporosis es una enfermedad en la que los huesos pierden los minerales y la fuerza por el avance de la edad. El resultado pueden ser fracturas graves en los hLeaf River El riesgo de osteoporosis puede identificarse con uArdelia Memsprueba de densidad sea.  Si tiene 65aos o ms, o si est en riesgo de sufrir osteoporosis y fracturas, pregunte a su mdico si debe someterse a exmenes.  Consulte a su mdico si debe tomar un suplemento de calcio o de vitamina D para reducir el riesgo de osteoporosis.  La menopausia puede presentar ciertos sntomas fsicos y rGaffer  La terapia de reemplazo hormonal puede reducir algunos de estos sntomas y rGaffer Consulte a su mdico para saber si la terapia de reemplazo hormonal es conveniente para usted. INSTRUCCIONES PARA EL CUIDADO EN EL HOGAR  Realcese los estudios de rutina de la salud, dentales y de lPublic librarian  MKennard  No consuma ningn producto que contenga tabaco, lo que incluye cigarrillos, tabaco de mHigher education careers advisero cPsychologist, sport and exercise  Si est embarazada, no beba alcohol.  Si est amamantando, reduzca el consumo de alcohol y la frecuencia con la que consume.  Si es mujer y no est embarazada limite el consumo de alcohol a no ms de 1 medida por da. Una medida equivale a 12onzas de cerveza, 5onzas de vino o 1onzas de bebidas alcohlicas de alta graduacin.  No consuma drogas.  No comparta agujas.  Solicite ayuda a su mdico si necesita apoyo o informacin para abandonar las drogas.  Informe a su mdico si a menudo se siente deprimido.  Notifique a su mdico si alguna vez ha sido vctima de abuso o si no se siente seguro en su hogar. Esta informacin no tiene cMarine scientistel consejo del mdico. Asegrese de hacerle al mdico cualquier pregunta que tenga. Document Released: 08/25/2011 Document Revised: 09/26/2014 Document Reviewed:  06/09/2015 Elsevier Interactive Patient Education  2019 [employer]Re[employer]

## 2018-11-09 NOTE — Progress Notes (Signed)
Ms Alexandra Aguirre presents for follow up of her chronic cystitis.  She reports feeling much better. Only occ pain.  PE AF VSS Lungs clear  Heart RRR Abd soft + BS  A/P Chronic Cystitis resolved        IUD, due for removal July 2021. Pt instructed to return in July 2021 for IUD removal. O/W she will follow up with [place] PRN Video Interrupter used during this visit.

## 2018-12-19 ENCOUNTER — Telehealth: Payer: Self-pay | Admitting: Family Medicine

## 2018-12-19 NOTE — Telephone Encounter (Signed)
New Message   1) Medication(s) Requested (by name): Pt is requesting her Allergy medication and Asthma   2) Pharmacy of Choice: [facility]  3) Special Requests:   Approved medications will be sent to the pharmacy, we will reach out if there is an issue.  Requests made after 3pm may not be addressed until the following business day!  If a patient is unsure of the name of the medication(s) please note and ask patient to call back when they are able to provide all info, do not send to responsible party until all information is available!

## 2018-12-19 NOTE — Telephone Encounter (Signed)
Will route to PCP for review. 

## 2018-12-20 NOTE — Telephone Encounter (Signed)
Pt picked up ventolin on 12/11/18, she had refills available at the pharmacy for cetirizine and Dulera, the refills were submitted and the pharmacy will reach out to the patient for payment/mail order info. No further action is required.

## 2018-12-24 ENCOUNTER — Ambulatory Visit: Payer: Self-pay | Attending: Family Medicine | Admitting: Family Medicine

## 2018-12-24 ENCOUNTER — Encounter: Payer: Self-pay | Admitting: Family Medicine

## 2018-12-24 ENCOUNTER — Other Ambulatory Visit: Payer: Self-pay

## 2018-12-24 DIAGNOSIS — J302 Other seasonal allergic rhinitis: Secondary | ICD-10-CM

## 2018-12-24 DIAGNOSIS — J4531 Mild persistent asthma with (acute) exacerbation: Secondary | ICD-10-CM

## 2018-12-24 MED ORDER — PREDNISONE 20 MG PO TABS: 20.0000 mg | ORAL_TABLET | Freq: Every day | ORAL | 0 refills | Status: DC

## 2018-12-24 MED ORDER — FLUTICASONE PROPIONATE 50 MCG/ACT NA SUSP: 2.0000 | Freq: Every day | NASAL | 2 refills | Status: DC

## 2018-12-24 MED ORDER — CETIRIZINE HCL 10 MG PO TABS: 10.0000 mg | ORAL_TABLET | Freq: Every day | ORAL | 1 refills | Status: DC

## 2018-12-24 MED ORDER — OLOPATADINE HCL 0.1 % OP SOLN: 1.0000 [drp] | Freq: Two times a day (BID) | OPHTHALMIC | 2 refills | Status: DC

## 2018-12-24 NOTE — Progress Notes (Signed)
Virtual Visit via Telephone Note  I connected with Alexandra Aguirre on 12/24/18 at  1:50 PM EDT by telephone and verified that I am speaking with the correct person using two identifiers.   I discussed the limitations, risks, security and privacy concerns of performing an evaluation and management service by telephone and the availability of in person appointments. I also discussed with the patient that there may be a patient responsible charge related to this service. The patient expressed understanding and agreed to proceed.   History of Present Illness: Alexandra Aguirre is a 43 year old female with history of type 2 diabetes mellitus (A1c 8.9), hyperlipidemia, GERD, gastritis seen today for an acute visit. She complains of itchy eyes, itchy nose, sneezing, rhinorrhea and symptoms are worse at night and prevent her from sleeping.  She is unable to state the exact duration of her symptoms but states they have been present ever since the pollen commenced.  She has noticed some wheezing but no dyspnea, chest pain, fever, cough or  myalgias. She has been compliant with her asthma medications. She denies a history of sick contacts.  Observations/Objective: Alert, awake, oriented x3 Not noted to be dyspneic or in acute distress  CMP Latest Ref Rng & Units 10/27/2018 05/31/2018 11/25/2017  Glucose 70 - 99 mg/dL 528(U) 132(G) 401(U)  BUN 6 - 20 mg/dL 6 7 5(L)  Creatinine 2.72 - 1.00 mg/dL 5.36 6.44 0.34  Sodium 135 - 145 mmol/L 134(L) 136 131(L)  Potassium 3.5 - 5.1 mmol/L 3.8 3.5 4.0  Chloride 98 - 111 mmol/L 100 102 99(L)  CO2 22 - 32 mmol/L 22 22 21(L)  Calcium 8.9 - 10.3 mg/dL 7.4(Q) 9.2 5.9(D)  Total Protein 6.5 - 8.1 g/dL 7.6 7.6 7.4  Total Bilirubin 0.3 - 1.2 mg/dL 0.7 0.5 0.9  Alkaline Phos 38 - 126 U/L 120 130(H) 139(H)  AST 15 - 41 U/L 19 30 60(H)  ALT 0 - 44 U/L 21 34 54    Lab Results  Component Value Date   HGBA1C 8.9 (A) 10/29/2018    Assessment and Plan: 1. Seasonal  allergies - olopatadine (PATANOL) 0.1 % ophthalmic solution; Place 1 drop into both eyes 2 (two) times daily.  Dispense: 5 mL; Refill: 2 - fluticasone (FLONASE) 50 MCG/ACT nasal spray; Place 2 sprays into both nostrils daily.  Dispense: 16 g; Refill: 2 - cetirizine (ZYRTEC) 10 MG tablet; Take 1 tablet (10 mg total) by mouth daily.  Dispense: 30 tablet; Refill: 1  2. Mild persistent asthma with exacerbation Current exacerbation triggered by the pollen Advised to present to the ED if symptoms worsen given the limited nature of his visit - predniSONE (DELTASONE) 20 MG tablet; Take 1 tablet (20 mg total) by mouth daily with breakfast.  Dispense: 5 tablet; Refill: 0   Follow Up Instructions:    I discussed the assessment and treatment plan with the patient. The patient was provided an opportunity to ask questions and all were answered. The patient agreed with the plan and demonstrated an understanding of the instructions.   The patient was advised to call back or seek an in-person evaluation if the symptoms worsen or if the condition fails to improve as anticipated.  I provided 15 minutes of non-face-to-face time during this encounter.   Hoy Register, MD

## 2018-12-24 NOTE — Progress Notes (Signed)
Patient has been called and DOB has been verified. Patient has been screened and transferred to PCP to start phone visit.  C/C: allergies

## 2019-01-31 ENCOUNTER — Ambulatory Visit: Payer: Self-pay | Admitting: Family Medicine

## 2019-02-12 ENCOUNTER — Other Ambulatory Visit: Payer: Self-pay

## 2019-02-12 ENCOUNTER — Encounter: Payer: Self-pay | Admitting: Family Medicine

## 2019-02-12 ENCOUNTER — Ambulatory Visit: Payer: Self-pay | Attending: Family Medicine | Admitting: Family Medicine

## 2019-02-12 DIAGNOSIS — J4520 Mild intermittent asthma, uncomplicated: Secondary | ICD-10-CM

## 2019-02-12 DIAGNOSIS — E7800 Pure hypercholesterolemia, unspecified: Secondary | ICD-10-CM

## 2019-02-12 DIAGNOSIS — E118 Type 2 diabetes mellitus with unspecified complications: Secondary | ICD-10-CM

## 2019-02-12 MED ORDER — TRUE METRIX AIR GLUCOSE METER DEVI: 1.0000 | Freq: Three times a day (TID) | 0 refills | Status: DC

## 2019-02-12 MED ORDER — INSULIN GLARGINE 100 UNIT/ML SOLOSTAR PEN: 10.0000 [IU] | PEN_INJECTOR | Freq: Every day | SUBCUTANEOUS | 6 refills | Status: DC

## 2019-02-12 MED ORDER — ATORVASTATIN CALCIUM 20 MG PO TABS: 20.0000 mg | ORAL_TABLET | Freq: Every day | ORAL | 6 refills | Status: DC

## 2019-02-12 MED ORDER — GLIPIZIDE 10 MG PO TABS: 10.0000 mg | ORAL_TABLET | Freq: Two times a day (BID) | ORAL | 6 refills | Status: DC

## 2019-02-12 MED ORDER — ALBUTEROL SULFATE HFA 108 (90 BASE) MCG/ACT IN AERS: 2.0000 | INHALATION_SPRAY | Freq: Four times a day (QID) | RESPIRATORY_TRACT | 6 refills | Status: DC | PRN

## 2019-02-12 MED ORDER — MOMETASONE FURO-FORMOTEROL FUM 100-5 MCG/ACT IN AERO: 2.0000 | INHALATION_SPRAY | Freq: Two times a day (BID) | RESPIRATORY_TRACT | 6 refills | Status: DC

## 2019-02-12 MED ORDER — SITAGLIPTIN PHOSPHATE 100 MG PO TABS: 100.0000 mg | ORAL_TABLET | Freq: Every day | ORAL | 6 refills | Status: DC

## 2019-02-12 MED ORDER — METFORMIN HCL 500 MG PO TABS: ORAL_TABLET | ORAL | 6 refills | Status: DC

## 2019-02-12 NOTE — Progress Notes (Signed)
Virtual Visit via Telephone Note  I connected with Alexandra Aguirre, on 02/12/2019 at 3:39 PM by telephone due to the COVID-19 pandemic and verified that I am speaking with the correct person using two identifiers.   Consent: I discussed the limitations, risks, security and privacy concerns of performing an evaluation and management service by telephone and the availability of in person appointments. I also discussed with the patient that there may be a patient responsible charge related to this service. The patient expressed understanding and agreed to proceed.   Location of Patient: Home  Location of Provider: Clinic   Persons participating in Telemedicine visit: Alexandra Aguirre-CMA Dr. Nelwyn Salisbury    History of Present Illness: Alexandra Aguirre is a 43 year old female with history of type 2 diabetes mellitus (A1c 8.9), hyperlipidemia, GERD, gastritis seen for follow-up visit today. At her last visit last month she was treated for seasonal allergies with Zyrtec and Flonase and reports resolution of those symptoms.  She has not been consistently checking her blood sugars as she states her glucometer does not work properly but endorses compliance with her medications.  She denies hypoglycemia, numbness in extremities or visual concerns. She does not exercise regularly and compliance with a diabetic diet, low-cholesterol diet, pain ascertained. Compliant with her statin and denies myalgia. Reflux symptoms are controlled on her PPI. Her asthma has been stable with no shortness of breath, wheezing or exacerbations.   Past Medical History:  Diagnosis Date  . Abnormal Pap smear of cervix 09/24/2013   AGUS  . Asthma   . Diabetes mellitus without complication (HCC)   . Environmental allergies   . GERD (gastroesophageal reflux disease)    Allergies  Allergen Reactions  . Fruit & Vegetable Daily [Nutritional Supplements] Other (See Comments)    "mouth rash" from  Avocado, apple, peaches, plums, kiwi    Current Outpatient Medications on File Prior to Visit  Medication Sig Dispense Refill  . albuterol (PROVENTIL HFA;VENTOLIN HFA) 108 (90 Base) MCG/ACT inhaler Inhale 2 puffs into the lungs every 6 (six) hours as needed for wheezing or shortness of breath. 1 Inhaler 2  . atorvastatin (LIPITOR) 20 MG tablet Take 1 tablet (20 mg total) by mouth daily. 30 tablet 6  . Blood Glucose Monitoring Suppl (TRUE METRIX AIR GLUCOSE METER) DEVI 1 each by Does not apply route 3 (three) times daily. 1 Device 0  . cetirizine (ZYRTEC) 10 MG tablet Take 1 tablet (10 mg total) by mouth daily. 30 tablet 1  . dicyclomine (BENTYL) 20 MG tablet Take 1 tablet (20 mg total) by mouth 2 (two) times daily. 20 tablet 0  . fluticasone (FLONASE) 50 MCG/ACT nasal spray Place 2 sprays into both nostrils daily. 16 g 2  . glipiZIDE (GLUCOTROL) 10 MG tablet Take 1 tablet (10 mg total) by mouth 2 (two) times daily before a meal. 60 tablet 6  . glucose blood (TRUE METRIX BLOOD GLUCOSE TEST) test strip 1 each by Other route 3 (three) times daily. 100 each 12  . Insulin Glargine (LANTUS SOLOSTAR) 100 UNIT/ML Solostar Pen Inject 10 Units into the skin daily. 3 pen 3  . Insulin Pen Needle 31G X 5 MM MISC 1 each by Does not apply route at bedtime. 30 each 5  . metFORMIN (GLUCOPHAGE) 500 MG tablet Take orally 3 tabs (1500 mg) in the morning and 2 tabs (1000 mg) in the evening    . methocarbamol (ROBAXIN) 500 MG tablet Take 2 tablets (1,000 mg total) by  mouth 3 (three) times daily. (Patient taking differently: Take 1,000 mg by mouth 3 (three) times daily as needed. ) 90 tablet 0  . mometasone-formoterol (DULERA) 100-5 MCG/ACT AERO Inhale 2 puffs into the lungs 2 (two) times daily. 13 g 6  . naproxen (NAPROSYN) 500 MG tablet Take 1 tablet (500 mg total) by mouth 2 (two) times daily as needed for moderate pain. 60 tablet 0  . olopatadine (PATANOL) 0.1 % ophthalmic solution Place 1 drop into both eyes 2  (two) times daily. 5 mL 2  . pantoprazole (PROTONIX) 40 MG tablet Take 1 tablet (40 mg total) by mouth daily. 30 tablet 6  . sitaGLIPtin (JANUVIA) 100 MG tablet Take 1 tablet (100 mg total) by mouth daily. 30 tablet 6  . triamcinolone cream (KENALOG) 0.1 % Apply 1 application topically 2 (two) times daily. 30 g 0  . TRUEPLUS LANCETS 28G MISC 1 each by Does not apply route 3 (three) times daily. 100 each 11  . ciprofloxacin-hydrocortisone (CIPRO HC) OTIC suspension Place 3 drops into both ears 2 (two) times daily. (Patient not taking: Reported on 02/12/2019) 10 mL 0  . predniSONE (DELTASONE) 20 MG tablet Take 1 tablet (20 mg total) by mouth daily with breakfast. (Patient not taking: Reported on 02/12/2019) 5 tablet 0   No current facility-administered medications on file prior to visit.     Observations/Objective: Awake, alert, oriented x3 Not in acute distress  CMP Latest Ref Rng & Units 10/27/2018 05/31/2018 11/25/2017  Glucose 70 - 99 mg/dL 161(W) 960(A) 540(J)  BUN 6 - 20 mg/dL 6 7 5(L)  Creatinine 8.11 - 1.00 mg/dL 9.14 7.82 9.56  Sodium 135 - 145 mmol/L 134(L) 136 131(L)  Potassium 3.5 - 5.1 mmol/L 3.8 3.5 4.0  Chloride 98 - 111 mmol/L 100 102 99(L)  CO2 22 - 32 mmol/L 22 22 21(L)  Calcium 8.9 - 10.3 mg/dL 2.1(H) 9.2 0.8(M)  Total Protein 6.5 - 8.1 g/dL 7.6 7.6 7.4  Total Bilirubin 0.3 - 1.2 mg/dL 0.7 0.5 0.9  Alkaline Phos 38 - 126 U/L 120 130(H) 139(H)  AST 15 - 41 U/L 19 30 60(H)  ALT 0 - 44 U/L 21 34 54    Lipid Panel     Component Value Date/Time   CHOL 191 03/09/2018 1147   CHOL 167 03/01/2017 1129   TRIG 190 (H) 03/09/2018 1147   HDL 31 (L) 03/09/2018 1147   HDL 31 (L) 03/01/2017 1129   CHOLHDL 6.2 03/09/2018 1147   VLDL 38 03/09/2018 1147   LDLCALC 122 (H) 03/09/2018 1147   LDLCALC 95 03/01/2017 1129     Assessment and Plan: 1. Pure hypercholesterolemia Uncontrolled with LDL of 122 which is above goal of less Aguirre 100 Low-cholesterol diet Lipid panel at next  visit - atorvastatin (LIPITOR) 20 MG tablet; Take 1 tablet (20 mg total) by mouth daily.  Dispense: 30 tablet; Refill: 6  2. Mild intermittent asthma without complication Controlled No exacerbations - albuterol (VENTOLIN HFA) 108 (90 Base) MCG/ACT inhaler; Inhale 2 puffs into the lungs every 6 (six) hours as needed for wheezing or shortness of breath.  Dispense: 1 Inhaler; Refill: 6 - mometasone-formoterol (DULERA) 100-5 MCG/ACT AERO; Inhale 2 puffs into the lungs 2 (two) times daily.  Dispense: 13 g; Refill: 6  3. Type 2 diabetes mellitus with complication, without long-term current use of insulin (HCC) Uncontrolled with A1c of 8.9 A1c due at next visit accordingly adjust regimen accordingly Counseled on Diabetic diet, my plate method, 578 minutes  of moderate intensity exercise/week Keep blood sugar logs with fasting goals of 80-120 mg/dl, random of less Aguirre [id number] and in the event of sugars less Aguirre 60 mg/dl or greater Aguirre 811400 mg/dl please notify the clinic ASAP. It is recommended that you undergo annual eye exams and annual foot exams. Pneumonia vaccine is recommended. - glipiZIDE (GLUCOTROL) 10 MG tablet; Take 1 tablet (10 mg total) by mouth 2 (two) times daily before a meal.  Dispense: 60 tablet; Refill: 6 - Insulin Glargine (LANTUS SOLOSTAR) 100 UNIT/ML Solostar Pen; Inject 10 Units into the skin daily.  Dispense: 3 pen; Refill: 6 - metFORMIN (GLUCOPHAGE) 500 MG tablet; Take orally 3 tabs (1500 mg) in the morning and 2 tabs (1000 mg) in the evening  Dispense: 150 tablet; Refill: 6 - sitaGLIPtin (JANUVIA) 100 MG tablet; Take 1 tablet (100 mg total) by mouth daily.  Dispense: 30 tablet; Refill: 6   Follow Up Instructions: Return in about 3 months (around 05/15/2019) for medical conditions.    I discussed the assessment and treatment plan with the patient. The patient was provided an opportunity to ask questions and all were answered. The patient agreed with the plan and demonstrated an  understanding of the instructions.   The patient was advised to call back or seek an in-person evaluation if the symptoms worsen or if the condition fails to improve as anticipated.     I provided 25 minutes total of non-face-to-face time during this encounter including median intraservice time, reviewing previous notes, labs, imaging, medications, management and patient verbalized understanding.     Hoy RegisterEnobong Lillieann Pavlich, MD, FAAFP. [facility] [place], [place] [phone]   02/12/2019, 3:39 PM

## 2019-02-12 NOTE — Progress Notes (Signed)
Patient has been called and DOB has been verified. Patient has been screened and transferred to PCP to start phone visit.     

## 2019-05-26 IMAGING — CT CT HEAD W/O CM
3 series · 15 of 47 positions shown, 18 images · non-contrast
Comparison: None.

CLINICAL DATA: Episodic vertigo

EXAM:
CT HEAD WITHOUT CONTRAST
TECHNIQUE: Contiguous axial images were obtained from the base of the skull
through the vertex without intravenous contrast.

[Series 2: head wo · axial · 0.47mm/px · z∈[-107,+18]mm · 9 of 30 slices shown, 12 images]
[im 3/30  brain]
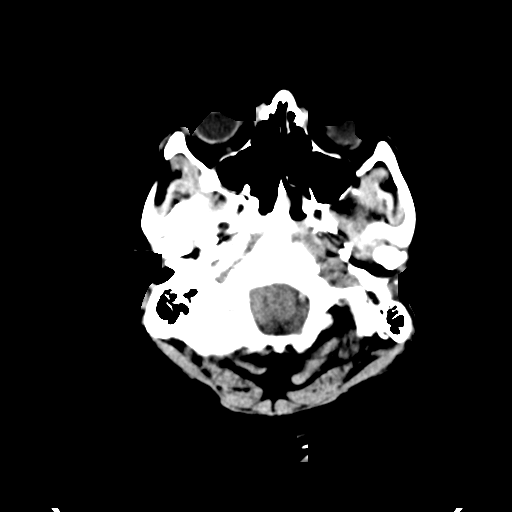
[im 3/30  bone]
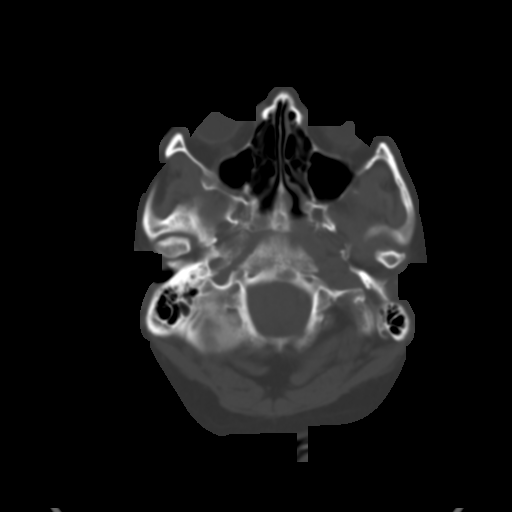
[im 6/30  brain]
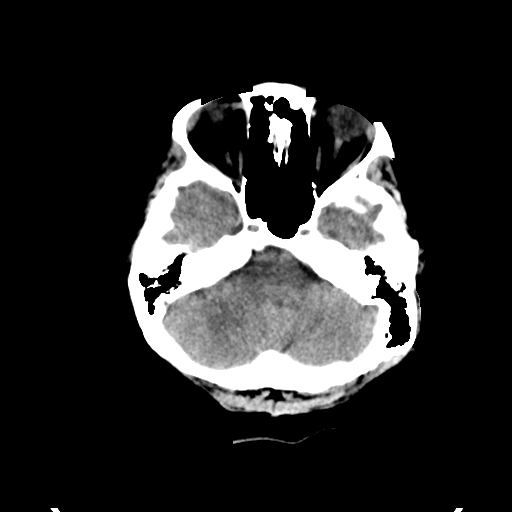
[im 9/30  brain]
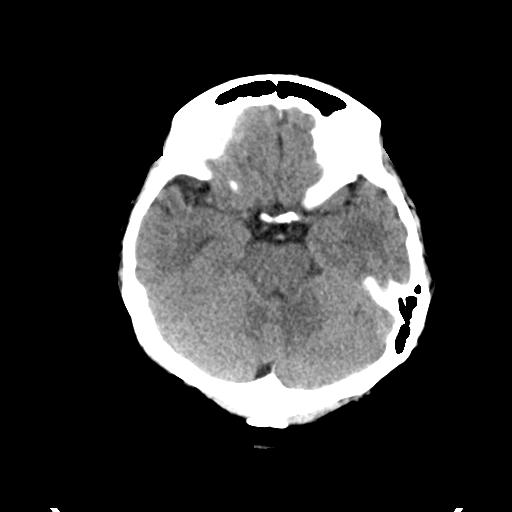
[im 12/30  brain]
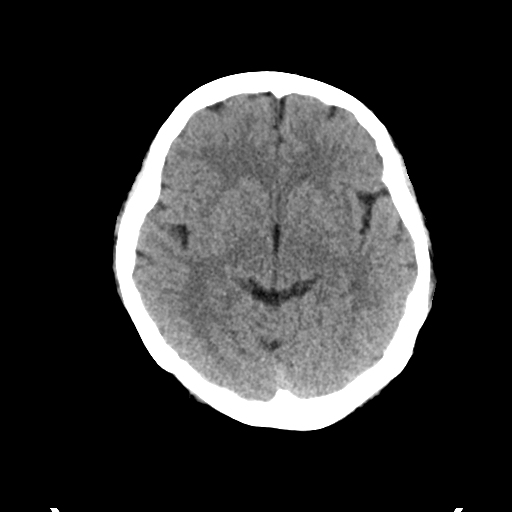
[im 16/30  brain]
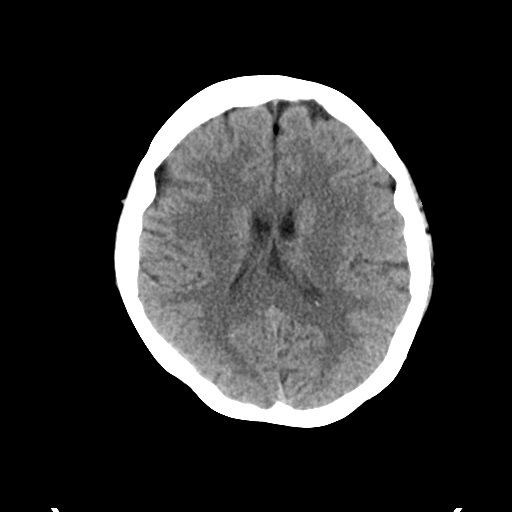
[im 16/30  bone]
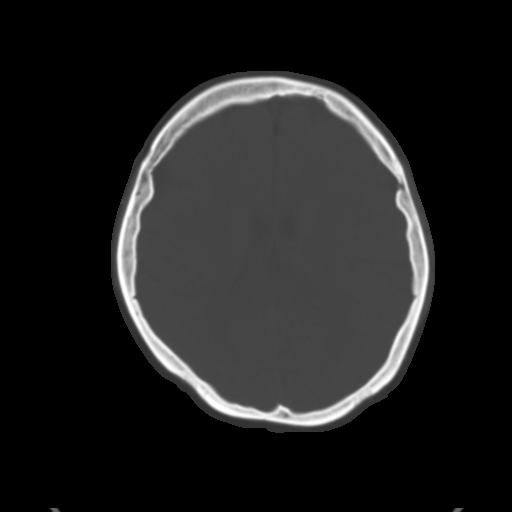
[im 19/30  brain]
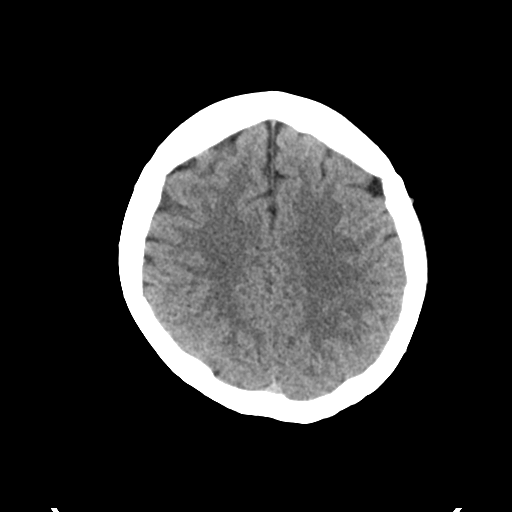
[im 22/30  brain]
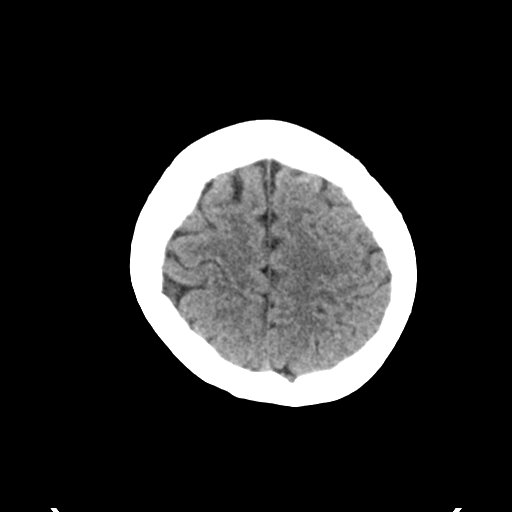
[im 25/30  brain]
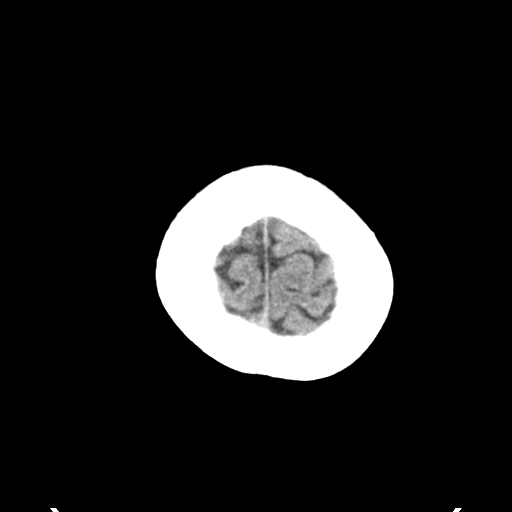
[im 28/30  brain]
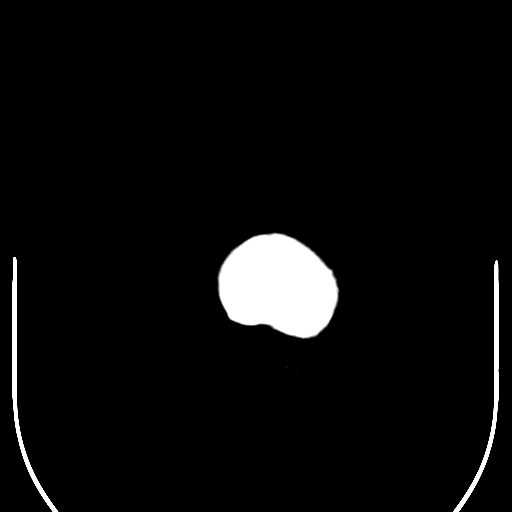
[im 28/30  bone]
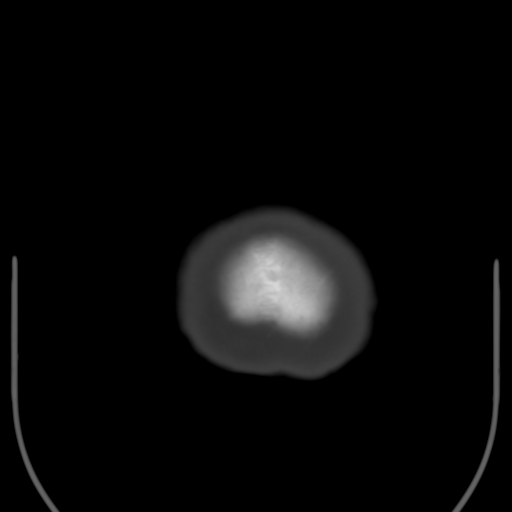

[Series 4: coronal soft tissue · coronal · 0.29mm/px · 3 of 63 slices shown]
[im 21/63  brain]
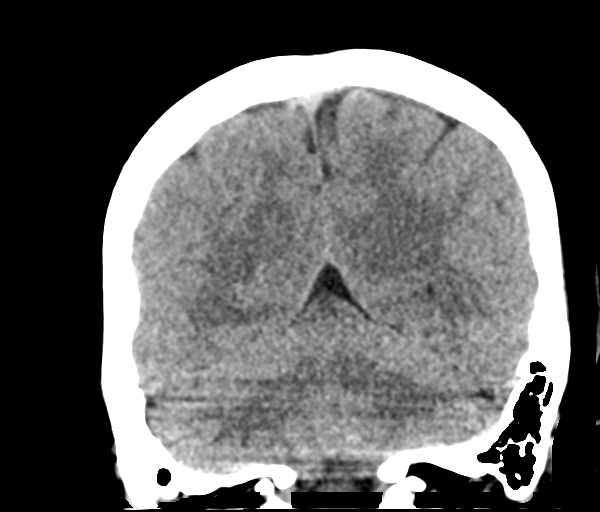
[im 28/63  brain]
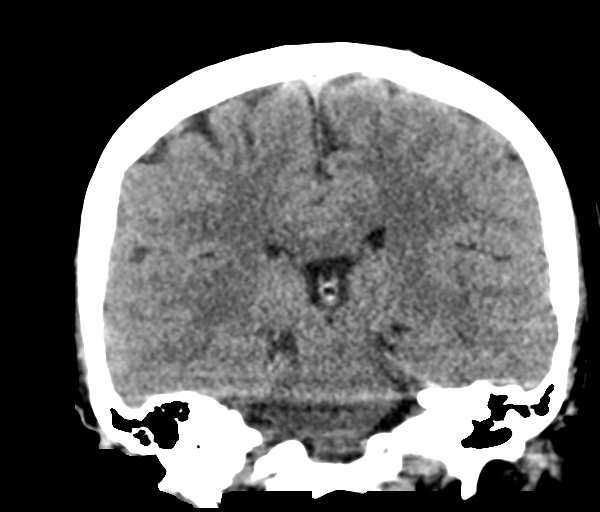
[im 35/63  brain]
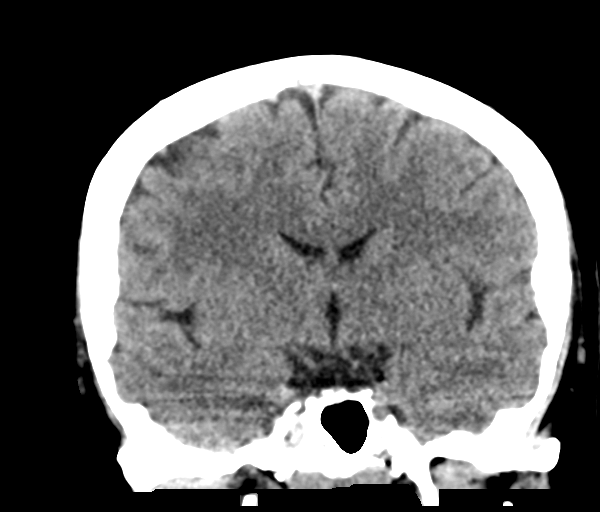

[Series 5: sagittal soft tissue · sagittal · 0.29mm/px · 3 of 59 slices shown]
[im 20/59  brain]
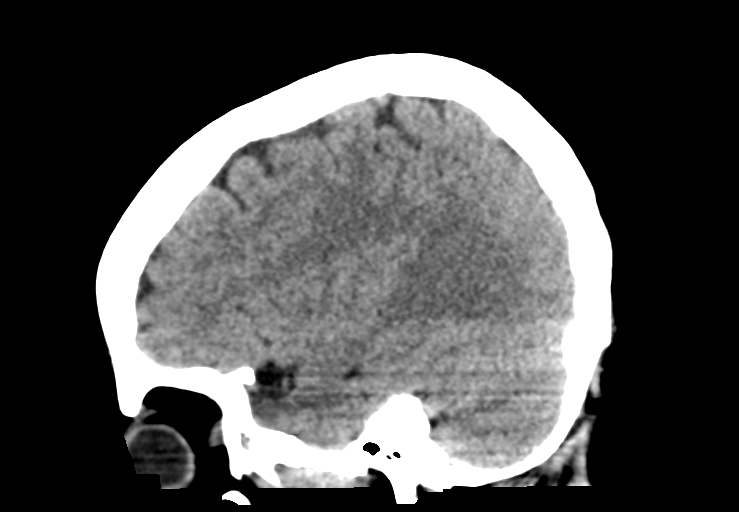
[im 30/59  brain]
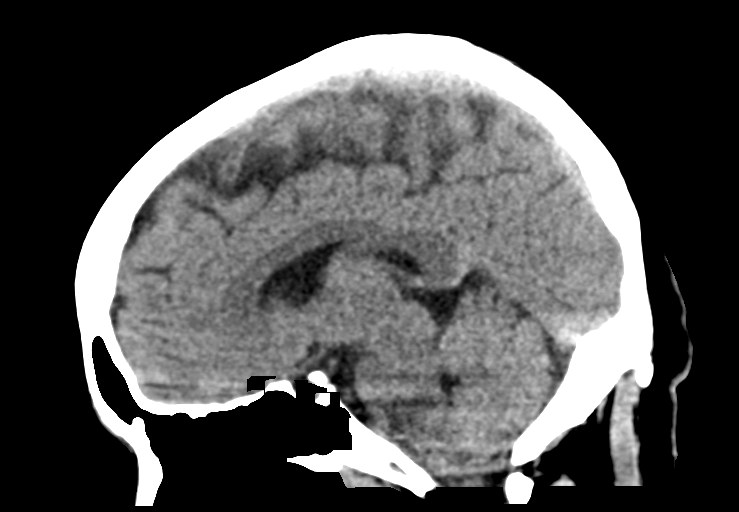
[im 39/59  brain]
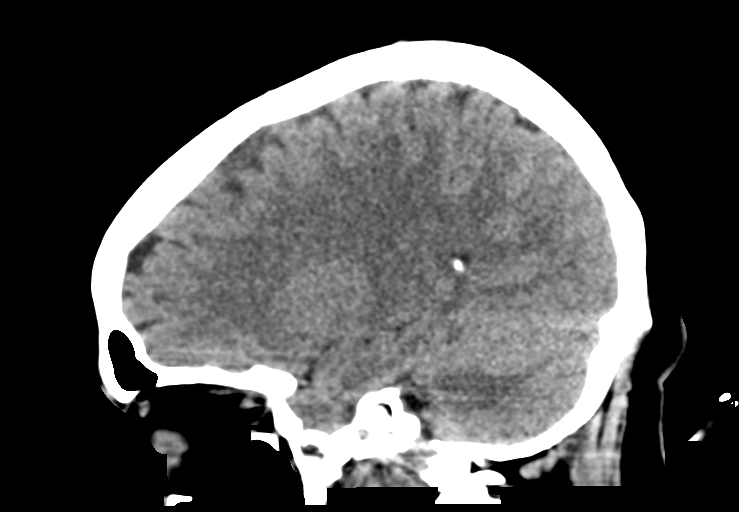

[15 of 47 positions shown; findings below may reference images not displayed]

FINDINGS: Brain: No evidence of acute infarction, hemorrhage, hydrocephalus,
extra-axial collection or mass lesion/mass effect.

Vascular: No hyperdense vessel or unexpected calcification.

Skull: No osseous abnormality.

Sinuses/Orbits: Visualized paranasal sinuses are clear. Visualized
mastoid sinuses are clear. Visualized orbits demonstrate no focal
abnormality.

Other: None
IMPRESSION: No acute intracranial pathology.

## 2019-07-05 ENCOUNTER — Other Ambulatory Visit: Payer: Self-pay

## 2019-07-05 DIAGNOSIS — Z20822 Contact with and (suspected) exposure to covid-19: Secondary | ICD-10-CM

## 2019-07-07 LAB — NOVEL CORONAVIRUS, NAA: SARS-CoV-2, NAA: DETECTED — AB

## 2019-07-31 ENCOUNTER — Emergency Department (HOSPITAL_COMMUNITY)
Admission: EM | Admit: 2019-07-31 | Discharge: 2019-08-01 | Disposition: A | Discharge status: Home / Self Care | Payer: Self-pay | Attending: Emergency Medicine | Admitting: Emergency Medicine

## 2019-07-31 ENCOUNTER — Emergency Department (HOSPITAL_COMMUNITY): Payer: Self-pay

## 2019-07-31 ENCOUNTER — Encounter (HOSPITAL_COMMUNITY): Payer: Self-pay | Admitting: Emergency Medicine

## 2019-07-31 DIAGNOSIS — N390 Urinary tract infection, site not specified: Secondary | ICD-10-CM | POA: Insufficient documentation

## 2019-07-31 DIAGNOSIS — Z79899 Other long term (current) drug therapy: Secondary | ICD-10-CM | POA: Insufficient documentation

## 2019-07-31 DIAGNOSIS — Z8709 Personal history of other diseases of the respiratory system: Secondary | ICD-10-CM | POA: Insufficient documentation

## 2019-07-31 DIAGNOSIS — R10814 Left lower quadrant abdominal tenderness: Secondary | ICD-10-CM | POA: Insufficient documentation

## 2019-07-31 DIAGNOSIS — U071 COVID-19: Secondary | ICD-10-CM | POA: Insufficient documentation

## 2019-07-31 DIAGNOSIS — J020 Streptococcal pharyngitis: Secondary | ICD-10-CM | POA: Insufficient documentation

## 2019-07-31 DIAGNOSIS — H9202 Otalgia, left ear: Secondary | ICD-10-CM | POA: Insufficient documentation

## 2019-07-31 DIAGNOSIS — E119 Type 2 diabetes mellitus without complications: Secondary | ICD-10-CM | POA: Insufficient documentation

## 2019-07-31 DIAGNOSIS — Z794 Long term (current) use of insulin: Secondary | ICD-10-CM | POA: Insufficient documentation

## 2019-07-31 LAB — URINALYSIS, ROUTINE W REFLEX MICROSCOPIC
Bilirubin Urine: NEGATIVE
Glucose, UA: >=500 mg/dL — AB
Ketones, ur: NEGATIVE mg/dL
Nitrite: NEGATIVE
Protein, ur: NEGATIVE mg/dL
RBC / HPF: >50 RBC/hpf — ABNORMAL HIGH (ref 0–5)
Specific Gravity, Urine: 1.023 (ref 1.005–1.030)
pH: 5 (ref 5.0–8.0)

## 2019-07-31 LAB — CBC WITH DIFFERENTIAL/PLATELET
Abs Immature Granulocytes: 0.03 10*3/uL (ref 0.00–0.07)
Basophils Absolute: 0.1 10*3/uL (ref 0.0–0.1)
Basophils Relative: 1 %
Eosinophils Absolute: 0.3 10*3/uL (ref 0.0–0.5)
Eosinophils Relative: 3 %
HCT: 40.8 % (ref 36.0–46.0)
Hemoglobin: 13.5 g/dL (ref 12.0–15.0)
Immature Granulocytes: 0 %
Lymphocytes Relative: 28 %
Lymphs Abs: 3 10*3/uL (ref 0.7–4.0)
MCH: 28.9 pg (ref 26.0–34.0)
MCHC: 33.1 g/dL (ref 30.0–36.0)
MCV: 87.4 fL (ref 80.0–100.0)
Monocytes Absolute: 0.7 10*3/uL (ref 0.1–1.0)
Monocytes Relative: 7 %
Neutro Abs: 6.5 10*3/uL (ref 1.7–7.7)
Neutrophils Relative %: 61 %
Platelets: 365 10*3/uL (ref 150–400)
RBC: 4.67 MIL/uL (ref 3.87–5.11)
RDW: 13.1 % (ref 11.5–15.5)
WBC: 10.5 10*3/uL (ref 4.0–10.5)
nRBC: 0 % (ref 0.0–0.2)

## 2019-07-31 LAB — COMPREHENSIVE METABOLIC PANEL
ALT: 14 U/L (ref 0–44)
AST: 17 U/L (ref 15–41)
Albumin: 3.5 g/dL (ref 3.5–5.0)
Alkaline Phosphatase: 107 U/L (ref 38–126)
Anion gap: 10 (ref 5–15)
BUN: 11 mg/dL (ref 6–20)
CO2: 22 mmol/L (ref 22–32)
Calcium: 8.9 mg/dL (ref 8.9–10.3)
Chloride: 104 mmol/L (ref 98–111)
Creatinine, Ser: 0.59 mg/dL (ref 0.44–1.00)
GFR calc Af Amer: >60 mL/min (ref >60)
GFR calc non Af Amer: >60 mL/min (ref >60)
Glucose, Bld: 228 mg/dL — ABNORMAL HIGH (ref 70–99)
Potassium: 3.8 mmol/L (ref 3.5–5.1)
Sodium: 136 mmol/L (ref 135–145)
Total Bilirubin: 0.4 mg/dL (ref 0.3–1.2)
Total Protein: 7.2 g/dL (ref 6.5–8.1)

## 2019-07-31 NOTE — ED Triage Notes (Signed)
Pt with c/o fever, left ear pain and sore throat. Husband has had Covid.

## 2019-08-01 LAB — GROUP A STREP BY PCR: Group A Strep by PCR: DETECTED — AB

## 2019-08-01 LAB — SARS CORONAVIRUS 2 (TAT 6-24 HRS): SARS Coronavirus 2: POSITIVE — AB

## 2019-08-01 MED ORDER — CEPHALEXIN 250 MG PO CAPS
500.0000 mg | ORAL_CAPSULE | Freq: Once | ORAL | Status: AC
  Administered 2019-08-01: 500 mg via ORAL
  Filled 2019-08-01: qty 2

## 2019-08-01 MED ORDER — CEPHALEXIN 500 MG PO CAPS: 500.0000 mg | ORAL_CAPSULE | Freq: Two times a day (BID) | ORAL | 0 refills | Status: AC

## 2019-08-01 MED ORDER — ACETAMINOPHEN 500 MG PO TABS
1000.0000 mg | ORAL_TABLET | Freq: Once | ORAL | Status: AC
  Administered 2019-08-01: 05:00:00 1000 mg via ORAL
  Filled 2019-08-01: qty 2

## 2019-08-01 MED FILL — CEPHALEXIN 500 MG CAPSULE: 500 | 10 days supply | Qty: 20 | Fill #0

## 2019-08-01 NOTE — ED Provider Notes (Signed)
[facility] EMERGENCY DEPARTMENT Provider Note   CSN: [id number] Arrival date & time: 07/31/19  1808     History   Chief Complaint Chief Complaint  Patient presents with  . Fever  . Sore Throat    HPI Alexandra Aguirre is a 43 y.o. female with history of asthma, diabetes, GERD who presents with 3-day history of fever, sore throat, headache, left ear pain.  Patient reports she thought she still blood clots when she spit up 1 day.  She has lost her sense of smell.  Patient also reports a 15-day history of intermittent left lower quadrant pain.  She has had urinary frequency for about the same time.  She also reports about 1 month of intermittent low back pain.  Patient reports her and her family recently tested positive for Covid 4 weeks ago, however she never had any symptoms.  Patient denies any chest pain, shortness of breath, cough, nausea, vomiting, diarrhea.      HPI  Past Medical History:  Diagnosis Date  . Abnormal Pap smear of cervix 09/24/2013   AGUS  . Asthma   . Diabetes mellitus without complication (HCC)   . Environmental allergies   . GERD (gastroesophageal reflux disease)     Patient Active Problem List   Diagnosis Date Noted  . IUD contraception 11/09/2018  . Acute tonsillitis 11/01/2018  . Acute hemorrhagic otitis externa of both ears 11/01/2018  . Non-recurrent acute suppurative otitis media of left ear without spontaneous rupture of tympanic membrane 11/01/2018  . Acute non-recurrent maxillary sinusitis 11/01/2018  . POP-Q stage 2 cystocele 09/20/2018  . Fatty liver 06/14/2018  . Hyperlipidemia 04/04/2018  . Dental caries 06/21/2017  . Vitamin D deficiency 03/01/2017  . Type 2 diabetes mellitus with complication, without long-term current use of insulin (HCC) 10/12/2016  . Gastroesophageal reflux disease 10/12/2016  . Generalized anxiety disorder 10/12/2016  . Asthma 01/15/2014  . Vertigo 11/25/2013  . History of cervical dysplasia  11/01/2013  . Pedal edema 09/30/2013  . Migraine headache 09/30/2013  . Gastritis and gastroduodenitis 09/30/2013  . Back pain 09/30/2013    Past Surgical History:  Procedure Laterality Date  . CERVICAL BIOPSY  W/ LOOP ELECTRODE EXCISION    . removal of cyst Right 2011     OB History    Gravida  5   Para  5   Term  5   Preterm      AB      Living  5     SAB      TAB      Ectopic      Multiple      Live Births               Home Medications    Prior to Admission medications   Medication Sig Start Date End Date Taking? Authorizing Provider  albuterol (VENTOLIN HFA) 108 (90 Base) MCG/ACT inhaler Inhale 2 puffs into the lungs every 6 (six) hours as needed for wheezing or shortness of breath. 02/12/19   Hoy Register, MD  atorvastatin (LIPITOR) 20 MG tablet Take 1 tablet (20 mg total) by mouth daily. 02/12/19   Hoy Register, MD  Blood Glucose Monitoring Suppl (TRUE METRIX AIR GLUCOSE METER) DEVI 1 each by Does not apply route 3 (three) times daily. 02/12/19   Hoy Register, MD  cephALEXin (KEFLEX) 500 MG capsule Take 1 capsule (500 mg total) by mouth 2 (two) times daily for 10 days. 08/01/19 08/11/19  Buel Ream  M, PA-C  cetirizine (ZYRTEC) 10 MG tablet Take 1 tablet (10 mg total) by mouth daily. 12/24/18   Hoy RegisterNewlin, Enobong, MD  dicyclomine (BENTYL) 20 MG tablet Take 1 tablet (20 mg total) by mouth 2 (two) times daily. 10/27/18   Gerhard MunchLockwood, Robert, MD  fluticasone [facility](FLONASE) 50 MCG/ACT nasal spray Place 2 sprays into both nostrils daily. 12/24/18   Hoy RegisterNewlin, Enobong, MD  glipiZIDE (GLUCOTROL) 10 MG tablet Take 1 tablet (10 mg total) by mouth 2 (two) times daily before a meal. 02/12/19   Newlin, Enobong, MD  glucose blood (TRUE METRIX BLOOD GLUCOSE TEST) test strip 1 each by Other route 3 (three) times daily. 08/02/18   Anders SimmondsMcClung, Angela M, PA-C  Insulin Glargine (LANTUS SOLOSTAR) 100 UNIT/ML Solostar Pen Inject 10 Units into the skin daily. 02/12/19   Hoy RegisterNewlin, Enobong, MD   Insulin Pen Needle 31G X 5 MM MISC 1 each by Does not apply route at bedtime. 10/29/18   Hoy RegisterNewlin, Enobong, MD  metFORMIN (GLUCOPHAGE) 500 MG tablet Take orally 3 tabs (1500 mg) in the morning and 2 tabs (1000 mg) in the evening 02/12/19   Hoy RegisterNewlin, Enobong, MD  methocarbamol (ROBAXIN) 500 MG tablet Take 2 tablets (1,000 mg total) by mouth 3 (three) times daily. Patient taking differently: Take 1,000 mg by mouth 3 (three) times daily as needed.  06/14/18   Anders SimmondsMcClung, Angela M, PA-C  mometasone-formoterol (DULERA) 100-5 MCG/ACT AERO Inhale 2 puffs into the lungs 2 (two) times daily. 02/12/19   Hoy RegisterNewlin, Enobong, MD  naproxen (NAPROSYN) 500 MG tablet Take 1 tablet (500 mg total) by mouth 2 (two) times daily as needed for moderate pain. 11/01/18   Storm FriskWright, Patrick E, MD  olopatadine (PATANOL) 0.1 % ophthalmic solution Place 1 drop into both eyes 2 (two) times daily. 12/24/18   Hoy RegisterNewlin, Enobong, MD  pantoprazole (PROTONIX) 40 MG tablet Take 1 tablet (40 mg total) by mouth daily. 11/01/18   Storm FriskWright, Patrick E, MD  sitaGLIPtin (JANUVIA) 100 MG tablet Take 1 tablet (100 mg total) by mouth daily. 02/12/19   Hoy RegisterNewlin, Enobong, MD  triamcinolone cream (KENALOG) 0.1 % Apply 1 application topically 2 (two) times daily. 10/29/18   Hoy RegisterNewlin, Enobong, MD  TRUEPLUS LANCETS 28G MISC 1 each by Does not apply route 3 (three) times daily. 08/02/18   Anders SimmondsMcClung, Angela M, PA-C    Family History Family History  Problem Relation Age of Onset  . Cancer Mother        cervical/ovarian  . Diabetes Mother   . Diabetes Maternal Uncle   . Diabetes Brother   . Diabetes Maternal Uncle   . Diabetes Maternal Uncle     Social History Social History   Tobacco Use  . Smoking status: Never Smoker  . Smokeless tobacco: Never Used  Substance Use Topics  . Alcohol use: No  . Drug use: No     Allergies   Fruit & vegetable daily [nutritional supplements]   Review of Systems Review of Systems  Constitutional: Positive for fever. Negative for  chills.  HENT: Positive for ear pain and sore throat. Negative for facial swelling.   Respiratory: Negative for cough and shortness of breath.   Cardiovascular: Negative for chest pain.  Gastrointestinal: Positive for abdominal pain. Negative for nausea and vomiting.  Genitourinary: Positive for frequency. Negative for dysuria.  Musculoskeletal: Positive for back pain.  Skin: Negative for rash and wound.  Neurological: Positive for headaches.  Psychiatric/Behavioral: The patient is not nervous/anxious.      Physical Exam Updated Vital  Signs BP 105/69 (BP Location: Right Arm)   Pulse 75   Temp 98.2 F (36.8 C) (Oral)   Resp 18   SpO2 98%   Physical Exam Vitals signs and nursing note reviewed.  Constitutional:      General: She is not in acute distress.    Appearance: She is well-developed. She is not diaphoretic.  HENT:     Head: Normocephalic and atraumatic.     Mouth/Throat:     Pharynx: No oropharyngeal exudate or posterior oropharyngeal erythema.     Tonsils: No tonsillar exudate or tonsillar abscesses. 2+ on the right. 2+ on the left.  Eyes:     General: No scleral icterus.       Right eye: No discharge.        Left eye: No discharge.     Conjunctiva/sclera: Conjunctivae normal.     Pupils: Pupils are equal, round, and reactive to light.  Neck:     Musculoskeletal: Normal range of motion and neck supple.     Thyroid: No thyromegaly.  Cardiovascular:     Rate and Rhythm: Normal rate and regular rhythm.     Heart sounds: Normal heart sounds. No murmur. No friction rub. No gallop.   Pulmonary:     Effort: Pulmonary effort is normal. No respiratory distress.     Breath sounds: Normal breath sounds. No stridor. No wheezing or rales.  Abdominal:     General: Bowel sounds are normal. There is no distension.     Palpations: Abdomen is soft.     Tenderness: There is abdominal tenderness (mild) in the left lower quadrant. There is no right CVA tenderness, left CVA  tenderness, guarding or rebound.  Lymphadenopathy:     Cervical: No cervical adenopathy.  Skin:    General: Skin is warm and dry.     Coloration: Skin is not pale.     Findings: No rash.  Neurological:     Mental Status: She is alert.     Coordination: Coordination normal.     Comments: CN 3-12 intact; normal sensation throughout; 5/5 strength in all 4 extremities; equal bilateral grip strength      ED Treatments / Results  Labs (all labs ordered are listed, but only abnormal results are displayed) Labs Reviewed  GROUP A STREP BY PCR - Abnormal; Notable for the following components:      Result Value   Group A Strep by PCR DETECTED (*)    All other components within normal limits  URINALYSIS, ROUTINE W REFLEX MICROSCOPIC - Abnormal; Notable for the following components:   APPearance HAZY (*)    Glucose, UA >=500 (*)    Hgb urine dipstick LARGE (*)    Leukocytes,Ua LARGE (*)    RBC / HPF >50 (*)    Bacteria, UA RARE (*)    All other components within normal limits  COMPREHENSIVE METABOLIC PANEL - Abnormal; Notable for the following components:   Glucose, Bld 228 (*)    All other components within normal limits  URINE CULTURE  SARS CORONAVIRUS 2 (TAT 6-24 HRS)  CBC WITH DIFFERENTIAL/PLATELET    EKG None  Radiology Dg Chest Portable 1 View  Result Date: 07/31/2019 CLINICAL DATA:  Cough fever and sore throat EXAM: PORTABLE CHEST 1 VIEW COMPARISON:  November 25, 2017 FINDINGS: The heart size and mediastinal contours are within normal limits. Both lungs are clear. The visualized skeletal structures are unremarkable. IMPRESSION: No acute cardiopulmonary process. Electronically Signed   By: Prudencio Pair  M.D.   On: 07/31/2019 23:10    Procedures Procedures (including critical care time)  Medications Ordered in ED Medications  acetaminophen (TYLENOL) tablet 1,000 mg (1,000 mg Oral Given 08/01/19 0440)  cephALEXin (KEFLEX) capsule 500 mg (500 mg Oral Given 08/01/19 0541)      Initial Impression / Assessment and Plan / ED Course  I have reviewed the triage vital signs and the nursing notes.  Pertinent labs & imaging results that were available during my care of the patient were reviewed by me and considered in my medical decision making (see chart for details).        Patient presenting with fever, sore throat, headache, left lower quadrant pain, back pain, urinary frequency.  Patient found to be strep positive.  She also has a urinary tract infection.  Patient with benign abdominal exam, no surgical abdomen.  No CVA tenderness.  Will treat strep and UTI with Keflex.  Urine culture sent.  COVID-19 test also pending.  Return precautions discussed.  Follow-up with PCP discussed.  Patient vitals stable throughout ED course and discharged in satisfactory condition.  Final Clinical Impressions(s) / ED Diagnoses   Final diagnoses:  Strep pharyngitis  Lower urinary tract infection, acute    ED Discharge Orders         Ordered    cephALEXin (KEFLEX) 500 MG capsule  2 times daily     08/01/19 0504           Emi Holes, PA-C 08/01/19 2956    Mesner, Barbara Cower, MD 08/02/19 [phone]

## 2019-08-01 NOTE — ED Notes (Signed)
Pt reports that her husband tested positive for covid 4 weeks ago.

## 2019-08-03 LAB — URINE CULTURE: Culture: >=100000 — AB

## 2019-08-04 ENCOUNTER — Telehealth: Payer: Self-pay | Admitting: *Deleted

## 2019-08-04 NOTE — Telephone Encounter (Signed)
Post ED Visit - Positive Culture Follow-up  Culture report reviewed by antimicrobial stewardship pharmacist: [place] Team []  Elenor Quinones, Pharm.D. []  Heide Guile, Pharm.D., BCPS AQ-ID []  Parks Neptune, Pharm.D., BCPS []  Alycia Rossetti, Pharm.D., BCPS []  Dortches, Pharm.D., BCPS, AAHIVP []  Legrand Como, Pharm.D., BCPS, AAHIVP [x]  Salome Arnt, PharmD, BCPS []  Johnnette Gourd, PharmD, BCPS []  Hughes Better, PharmD, BCPS []  Leeroy Cha, PharmD []  Laqueta Linden, PharmD, BCPS []  Albertina Parr, PharmD  [place] Team []  Leodis Sias, PharmD []  Lindell Spar, PharmD []  Royetta Asal, PharmD []  Graylin Shiver, Rph []  Rema Fendt) Glennon Mac, PharmD []  Arlyn Dunning, PharmD []  Netta Cedars, PharmD []  Dia Sitter, PharmD []  Leone Haven, PharmD []  Gretta Arab, PharmD []  Theodis Shove, PharmD []  Peggyann Juba, PharmD []  Reuel Boom, PharmD   Positive urine culture Treated with Cephalexin, organism sensitive to the same and no further patient follow-up is required at this time.  Harlon Flor [facility] 08/04/2019, 10:15 AM

## 2019-08-19 ENCOUNTER — Other Ambulatory Visit: Payer: Self-pay

## 2019-08-19 ENCOUNTER — Emergency Department (HOSPITAL_COMMUNITY): Payer: Self-pay

## 2019-08-19 ENCOUNTER — Encounter (HOSPITAL_COMMUNITY): Payer: Self-pay | Admitting: Emergency Medicine

## 2019-08-19 ENCOUNTER — Emergency Department (HOSPITAL_COMMUNITY)
Admission: EM | Admit: 2019-08-19 | Discharge: 2019-08-20 | Disposition: A | Discharge status: Home / Self Care | Payer: Self-pay | Attending: Emergency Medicine | Admitting: Emergency Medicine

## 2019-08-19 DIAGNOSIS — Z794 Long term (current) use of insulin: Secondary | ICD-10-CM | POA: Insufficient documentation

## 2019-08-19 DIAGNOSIS — Y929 Unspecified place or not applicable: Secondary | ICD-10-CM | POA: Insufficient documentation

## 2019-08-19 DIAGNOSIS — W500XXA Accidental hit or strike by another person, initial encounter: Secondary | ICD-10-CM | POA: Insufficient documentation

## 2019-08-19 DIAGNOSIS — E119 Type 2 diabetes mellitus without complications: Secondary | ICD-10-CM | POA: Insufficient documentation

## 2019-08-19 DIAGNOSIS — Y999 Unspecified external cause status: Secondary | ICD-10-CM | POA: Insufficient documentation

## 2019-08-19 DIAGNOSIS — S63602A Unspecified sprain of left thumb, initial encounter: Secondary | ICD-10-CM | POA: Insufficient documentation

## 2019-08-19 DIAGNOSIS — Y939 Activity, unspecified: Secondary | ICD-10-CM | POA: Insufficient documentation

## 2019-08-19 MED ORDER — IBUPROFEN 800 MG PO TABS
800.0000 mg | ORAL_TABLET | Freq: Once | ORAL | Status: AC
  Administered 2019-08-19: 800 mg via ORAL
  Filled 2019-08-19: qty 1

## 2019-08-19 MED ORDER — IBUPROFEN 800 MG PO TABS: 800.0000 mg | ORAL_TABLET | Freq: Three times a day (TID) | ORAL | 0 refills | Status: DC

## 2019-08-19 NOTE — ED Triage Notes (Signed)
Pt reports a thumb injury earlier today, left thumb was pulled "all the way back."

## 2019-08-19 NOTE — ED Provider Notes (Signed)
[facility] EMERGENCY DEPARTMENT Provider Note   CSN: [id number] Arrival date & time: 08/19/19  1920     History   Chief Complaint Chief Complaint  Patient presents with  . Hand Injury    HPI Alexandra Aguirre is a 43 y.o. female.     Patient presents to the emergency department with a chief complaint of left thumb injury.  She reports that her son sat down on her left hand accidentally this evening hyperextending the thumb.  She complains of significant pain at the base of the thumb.  She has not taken anything for her symptoms.  Reports increased pain with movement.  Denies numbness.  The history is provided by the patient. The history is limited by a language barrier. A language interpreter was used Customer service manager interpreter).    Past Medical History:  Diagnosis Date  . Abnormal Pap smear of cervix 09/24/2013   AGUS  . Asthma   . Diabetes mellitus without complication ([place])   . Environmental allergies   . GERD (gastroesophageal reflux disease)     Patient Active Problem List   Diagnosis Date Noted  . IUD contraception 11/09/2018  . Acute tonsillitis 11/01/2018  . Acute hemorrhagic otitis externa of both ears 11/01/2018  . Non-recurrent acute suppurative otitis media of left ear without spontaneous rupture of tympanic membrane 11/01/2018  . Acute non-recurrent maxillary sinusitis 11/01/2018  . POP-Q stage 2 cystocele 09/20/2018  . Fatty liver 06/14/2018  . Hyperlipidemia 04/04/2018  . Dental caries 06/21/2017  . Vitamin D deficiency 03/01/2017  . Type 2 diabetes mellitus with complication, without long-term current use of insulin ([place]) 10/12/2016  . Gastroesophageal reflux disease 10/12/2016  . Generalized anxiety disorder 10/12/2016  . Asthma 01/15/2014  . Vertigo 11/25/2013  . History of cervical dysplasia 11/01/2013  . Pedal edema 09/30/2013  . Migraine headache 09/30/2013  . Gastritis and gastroduodenitis 09/30/2013  . Back pain  09/30/2013    Past Surgical History:  Procedure Laterality Date  . CERVICAL BIOPSY  W/ LOOP ELECTRODE EXCISION    . removal of cyst Right 2011     OB History    Gravida  5   Para  5   Term  5   Preterm      AB      Living  5     SAB      TAB      Ectopic      Multiple      Live Births               Home Medications    Prior to Admission medications   Medication Sig Start Date End Date Taking? Authorizing Provider  albuterol (VENTOLIN HFA) 108 (90 Base) MCG/ACT inhaler Inhale 2 puffs into the lungs every 6 (six) hours as needed for wheezing or shortness of breath. 02/12/19   Charlott Rakes, MD  atorvastatin (LIPITOR) 20 MG tablet Take 1 tablet (20 mg total) by mouth daily. 02/12/19   Charlott Rakes, MD  Blood Glucose Monitoring Suppl (TRUE METRIX AIR GLUCOSE METER) DEVI 1 each by Does not apply route 3 (three) times daily. 02/12/19   Charlott Rakes, MD  cetirizine (ZYRTEC) 10 MG tablet Take 1 tablet (10 mg total) by mouth daily. 12/24/18   Charlott Rakes, MD  dicyclomine (BENTYL) 20 MG tablet Take 1 tablet (20 mg total) by mouth 2 (two) times daily. 10/27/18   Carmin Muskrat, MD  fluticasone [facility]) 50 MCG/ACT nasal spray Place 2 sprays  into both nostrils daily. 12/24/18   Hoy RegisterNewlin, Enobong, MD  glipiZIDE (GLUCOTROL) 10 MG tablet Take 1 tablet (10 mg total) by mouth 2 (two) times daily before a meal. 02/12/19   Newlin, Enobong, MD  glucose blood (TRUE METRIX BLOOD GLUCOSE TEST) test strip 1 each by Other route 3 (three) times daily. 08/02/18   Anders SimmondsMcClung, Angela M, PA-C  ibuprofen (ADVIL) 800 MG tablet Take 1 tablet (800 mg total) by mouth 3 (three) times daily. 08/19/19   Roxy HorsemanBrowning, Silvia Markuson, PA-C  Insulin Glargine (LANTUS SOLOSTAR) 100 UNIT/ML Solostar Pen Inject 10 Units into the skin daily. 02/12/19   Hoy RegisterNewlin, Enobong, MD  Insulin Pen Needle 31G X 5 MM MISC 1 each by Does not apply route at bedtime. 10/29/18   Hoy RegisterNewlin, Enobong, MD  metFORMIN (GLUCOPHAGE) 500 MG tablet Take  orally 3 tabs (1500 mg) in the morning and 2 tabs (1000 mg) in the evening 02/12/19   Hoy RegisterNewlin, Enobong, MD  methocarbamol (ROBAXIN) 500 MG tablet Take 2 tablets (1,000 mg total) by mouth 3 (three) times daily. Patient taking differently: Take 1,000 mg by mouth 3 (three) times daily as needed.  06/14/18   Anders SimmondsMcClung, Angela M, PA-C  mometasone-formoterol (DULERA) 100-5 MCG/ACT AERO Inhale 2 puffs into the lungs 2 (two) times daily. 02/12/19   Hoy RegisterNewlin, Enobong, MD  naproxen (NAPROSYN) 500 MG tablet Take 1 tablet (500 mg total) by mouth 2 (two) times daily as needed for moderate pain. 11/01/18   Storm FriskWright, Patrick E, MD  olopatadine (PATANOL) 0.1 % ophthalmic solution Place 1 drop into both eyes 2 (two) times daily. 12/24/18   Hoy RegisterNewlin, Enobong, MD  pantoprazole (PROTONIX) 40 MG tablet Take 1 tablet (40 mg total) by mouth daily. 11/01/18   Storm FriskWright, Patrick E, MD  sitaGLIPtin (JANUVIA) 100 MG tablet Take 1 tablet (100 mg total) by mouth daily. 02/12/19   Hoy RegisterNewlin, Enobong, MD  triamcinolone cream (KENALOG) 0.1 % Apply 1 application topically 2 (two) times daily. 10/29/18   Hoy RegisterNewlin, Enobong, MD  TRUEPLUS LANCETS 28G MISC 1 each by Does not apply route 3 (three) times daily. 08/02/18   Anders SimmondsMcClung, Angela M, PA-C    Family History Family History  Problem Relation Age of Onset  . Cancer Mother        cervical/ovarian  . Diabetes Mother   . Diabetes Maternal Uncle   . Diabetes Brother   . Diabetes Maternal Uncle   . Diabetes Maternal Uncle     Social History Social History   Tobacco Use  . Smoking status: Never Smoker  . Smokeless tobacco: Never Used  Substance Use Topics  . Alcohol use: No  . Drug use: No     Allergies   Fruit & vegetable daily [nutritional supplements]   Review of Systems Review of Systems  All other systems reviewed and are negative.    Physical Exam Updated Vital Signs BP 114/60 (BP Location: Right Arm)   Pulse 82   Temp 98.1 F (36.7 C) (Oral)   Resp 16   SpO2 100%    Physical Exam Vitals signs and nursing note reviewed.  Constitutional:      General: She is not in acute distress.    Appearance: She is well-developed.  HENT:     Head: Normocephalic and atraumatic.  Eyes:     Conjunctiva/sclera: Conjunctivae normal.  Neck:     Musculoskeletal: Neck supple.  Cardiovascular:     Rate and Rhythm: Normal rate.     Heart sounds: No murmur.  Comments: Brisk capillary refill, normal pulses Pulmonary:     Effort: Pulmonary effort is normal. No respiratory distress.  Abdominal:     General: There is no distension.  Musculoskeletal:     Comments: No significant swelling about the left thumb, there is mild tenderness to palpation, range of motion is limited secondary to pain, strength is deferred  Skin:    General: Skin is warm and dry.  Neurological:     Mental Status: She is alert and oriented to person, place, and time.     Comments: Sensation intact  Psychiatric:        Mood and Affect: Mood normal.        Behavior: Behavior normal.      ED Treatments / Results  Labs (all labs ordered are listed, but only abnormal results are displayed) Labs Reviewed - No data to display  EKG None  Radiology Dg Hand Complete Left  Result Date: 08/19/2019 CLINICAL DATA:  Thumb injury EXAM: LEFT HAND - COMPLETE 3+ VIEW COMPARISON:  None. FINDINGS: No acute bony abnormality. Specifically, no fracture, subluxation, or dislocation. Joint spaces maintained. Soft tissues intact. IMPRESSION: Negative. Electronically Signed   By: Charlett Nose M.D.   On: 08/19/2019 21:39    Procedures Procedures (including critical care time)  Medications Ordered in ED Medications  ibuprofen (ADVIL) tablet 800 mg (has no administration in time range)     Initial Impression / Assessment and Plan / ED Course  I have reviewed the triage vital signs and the nursing notes.  Pertinent labs & imaging results that were available during my care of the patient were reviewed by  me and considered in my medical decision making (see chart for details).        Patient with left thumb injury.  I do have some concern for skiers thumb given the mechanism.  Plain films are negative for fracture.  Patient will be placed in a thumb spica splint and referred to hand.  Patient also comments that she had a skin burn to her contralateral index and middle finger.  The burns are very small (43mm) and very superficial.  I encouraged her to apply abx ointment.  Final Clinical Impressions(s) / ED Diagnoses   Final diagnoses:  Sprain of left thumb, unspecified site of digit, initial encounter    ED Discharge Orders         Ordered    ibuprofen (ADVIL) 800 MG tablet  3 times daily     08/19/19 2330           Roxy Horseman, PA-C 08/19/19 2335    Mesner, Barbara Cower, MD 08/20/19 1829

## 2019-08-20 NOTE — Progress Notes (Signed)
Orthopedic Tech Progress Note Patient Details:  Alexandra Aguirre [birth year] [id number]  Ortho Devices Type of Ortho Device: Thumb velcro splint Ortho Device/Splint Location: LUE Ortho Device/Splint Interventions: Ordered, Application, Adjustment   Post Interventions Patient Tolerated: Well Instructions Provided: Adjustment of device, Care of device   [place] 08/20/2019, 12:09 AM

## 2019-08-28 ENCOUNTER — Ambulatory Visit: Payer: Self-pay | Attending: Family Medicine | Admitting: Physician Assistant

## 2019-08-28 ENCOUNTER — Other Ambulatory Visit: Payer: Self-pay

## 2019-08-28 DIAGNOSIS — Z09 Encounter for follow-up examination after completed treatment for conditions other than malignant neoplasm: Secondary | ICD-10-CM

## 2019-08-28 DIAGNOSIS — E119 Type 2 diabetes mellitus without complications: Secondary | ICD-10-CM

## 2019-08-28 DIAGNOSIS — J4520 Mild intermittent asthma, uncomplicated: Secondary | ICD-10-CM

## 2019-08-28 DIAGNOSIS — B379 Candidiasis, unspecified: Secondary | ICD-10-CM

## 2019-08-28 DIAGNOSIS — B373 Candidiasis of vulva and vagina: Secondary | ICD-10-CM

## 2019-08-28 DIAGNOSIS — E118 Type 2 diabetes mellitus with unspecified complications: Secondary | ICD-10-CM

## 2019-08-28 DIAGNOSIS — E7800 Pure hypercholesterolemia, unspecified: Secondary | ICD-10-CM

## 2019-08-28 MED ORDER — FLUCONAZOLE 150 MG PO TABS: 150.0000 mg | ORAL_TABLET | Freq: Once | ORAL | 0 refills | Status: AC

## 2019-08-28 MED ORDER — GLIPIZIDE 10 MG PO TABS: 10.0000 mg | ORAL_TABLET | Freq: Two times a day (BID) | ORAL | 2 refills | Status: DC

## 2019-08-28 MED ORDER — ATORVASTATIN CALCIUM 20 MG PO TABS: 20.0000 mg | ORAL_TABLET | Freq: Every day | ORAL | 2 refills | Status: DC

## 2019-08-28 MED ORDER — METFORMIN HCL 500 MG PO TABS: ORAL_TABLET | ORAL | 2 refills | Status: DC

## 2019-08-28 MED ORDER — ALBUTEROL SULFATE HFA 108 (90 BASE) MCG/ACT IN AERS: 2.0000 | INHALATION_SPRAY | Freq: Four times a day (QID) | RESPIRATORY_TRACT | 0 refills | Status: DC | PRN

## 2019-08-28 MED ORDER — INSULIN PEN NEEDLE 31G X 5 MM MISC: 1.0000 | Freq: Every day | 5 refills | Status: DC

## 2019-08-28 MED ORDER — LANTUS SOLOSTAR 100 UNIT/ML ~~LOC~~ SOPN: 10.0000 [IU] | PEN_INJECTOR | Freq: Every day | SUBCUTANEOUS | 6 refills | Status: DC

## 2019-08-28 MED ORDER — SITAGLIPTIN PHOSPHATE 100 MG PO TABS: 100.0000 mg | ORAL_TABLET | Freq: Every day | ORAL | 2 refills | Status: DC

## 2019-08-28 NOTE — Progress Notes (Signed)
Virtual Visit via Telephone Note  I connected with Alexandra Aguirre on 08/28/19 at 10:50 AM EST by telephone and verified that I am speaking with the correct person using two identifiers.   I discussed the limitations, risks, security and privacy concerns of performing an evaluation and management service by telephone and the availability of in person appointments. I also discussed with the patient that there may be a patient responsible charge related to this service. The patient expressed understanding and agreed to proceed.  Patient location:  home My Location:  [place] office Persons on the call:  Me, the patient, and interpreter   History of Present Illness:   For hospital follow up of 2 visits.  Saw the hand specialist this week for hand injury in follow up.    Also seen in ED 07/31/2019 for strep throat and tested +Covid 08/01/2019.  Feeling better from all of that.  She does feel like she is getting a yeast infection and is having some vaginal itching s/p antibiotics.   Blood sugars running around 160-200 but she admits she doesn't check her blood sugars regularly.    From ED note 11/30: Was seen in ED 08/19/2019 with left thumb injury.  I do have some concern for skiers thumb given the mechanism.  Plain films are negative for fracture.  Patient will be placed in a thumb spica splint and referred to hand.  Patient also comments that she had a skin burn to her contralateral index and middle finger.  The burns are very small (58mm) and very superficial.  I encouraged her to apply abx ointment.     Observations/Objective:  NAD.  A&Ox3   Assessment and Plan: 1. Type 2 diabetes mellitus with complication, without long-term current use of insulin (HCC) Not at goal.  Work on diet and see PCP in 1 month - glipiZIDE (GLUCOTROL) 10 MG tablet; Take 1 tablet (10 mg total) by mouth 2 (two) times daily before a meal.  Dispense: 60 tablet; Refill: 2 - metFORMIN (GLUCOPHAGE) 500 MG tablet; Take  orally 3 tabs (1500 mg) in the morning and 2 tabs (1000 mg) in the evening  Dispense: 150 tablet; Refill: 2 - Insulin Glargine (LANTUS SOLOSTAR) 100 UNIT/ML Solostar Pen; Inject 10 Units into the skin daily.  Dispense: 3 pen; Refill: 6 - Insulin Pen Needle 31G X 5 MM MISC; 1 each by Does not apply route at bedtime.  Dispense: 30 each; Refill: 5 - sitaGLIPtin (JANUVIA) 100 MG tablet; Take 1 tablet (100 mg total) by mouth daily.  Dispense: 30 tablet; Refill: 2  2. Mild intermittent asthma without complication - albuterol (VENTOLIN HFA) 108 (90 Base) MCG/ACT inhaler; Inhale 2 puffs into the lungs every 6 (six) hours as needed for wheezing or shortness of breath.  Dispense: 18 g; Refill: 0  3. Pure hypercholesterolemia - atorvastatin (LIPITOR) 20 MG tablet; Take 1 tablet (20 mg total) by mouth daily.  Dispense: 30 tablet; Refill: 2  4. Yeast infection Recent antibiotics - fluconazole (DIFLUCAN) 150 MG tablet; Take 1 tablet (150 mg total) by mouth once for 1 dose.  Dispense: 1 tablet; Refill: 0  5. Encounter for examination following treatment at hospital Doing well overall.     Follow Up Instructions: See PCP in 1 month   I discussed the assessment and treatment plan with the patient. The patient was provided an opportunity to ask questions and all were answered. The patient agreed with the plan and demonstrated an understanding of the instructions.  The patient was advised to call back or seek an in-person evaluation if the symptoms worsen or if the condition fails to improve as anticipated.  I provided 15 minutes of non-face-to-face time during this encounter.   Alexandra Co, PA-C  Patient ID: Alexandra Aguirre, female   DOB: 1976-05-18, 43 y.o.   MRN: [id number]

## 2019-11-05 ENCOUNTER — Other Ambulatory Visit: Payer: Self-pay

## 2019-11-05 ENCOUNTER — Other Ambulatory Visit: Payer: Self-pay | Admitting: Critical Care Medicine

## 2019-11-05 ENCOUNTER — Encounter: Payer: Self-pay | Admitting: Family

## 2019-11-05 ENCOUNTER — Other Ambulatory Visit: Payer: Self-pay | Admitting: Physician Assistant

## 2019-11-05 ENCOUNTER — Ambulatory Visit: Payer: Self-pay | Attending: Family | Admitting: Family

## 2019-11-05 VITALS — BP 107/75 | HR 76 | Ht 60.0 in | Wt 186.0 lb

## 2019-11-05 DIAGNOSIS — E118 Type 2 diabetes mellitus with unspecified complications: Secondary | ICD-10-CM

## 2019-11-05 DIAGNOSIS — M79601 Pain in right arm: Secondary | ICD-10-CM

## 2019-11-05 DIAGNOSIS — B373 Candidiasis of vulva and vagina: Secondary | ICD-10-CM

## 2019-11-05 DIAGNOSIS — R300 Dysuria: Secondary | ICD-10-CM

## 2019-11-05 DIAGNOSIS — B3731 Acute candidiasis of vulva and vagina: Secondary | ICD-10-CM

## 2019-11-05 DIAGNOSIS — R102 Pelvic and perineal pain: Secondary | ICD-10-CM

## 2019-11-05 LAB — POCT URINALYSIS DIP (CLINITEK)
Bilirubin, UA: NEGATIVE
Glucose, UA: >=1000 mg/dL — AB
Ketones, POC UA: NEGATIVE mg/dL
Leukocytes, UA: NEGATIVE
Nitrite, UA: NEGATIVE
POC PROTEIN,UA: NEGATIVE
Spec Grav, UA: 1.02 (ref 1.010–1.025)
Urobilinogen, UA: 0.2 E.U./dL
pH, UA: 6 (ref 5.0–8.0)

## 2019-11-05 MED ORDER — NAPROXEN 500 MG PO TABS: 500.0000 mg | ORAL_TABLET | Freq: Two times a day (BID) | ORAL | 0 refills | Status: DC

## 2019-11-05 MED ORDER — METFORMIN HCL 500 MG PO TABS: ORAL_TABLET | ORAL | 2 refills | Status: DC

## 2019-11-05 MED ORDER — TRUE METRIX AIR GLUCOSE METER DEVI: 1.0000 | Freq: Three times a day (TID) | 0 refills | Status: DC

## 2019-11-05 NOTE — Patient Instructions (Signed)
Referred to emergency department for IUD removal.Naproxen for pain management. Metformin refilled for 1 month. Return in 1 month for follow-up with attending physician for management of chronic conditions. Colocacin de un dispositivo intrauterino Intrauterine Device Insertion Un dispositivo intrauterino (DIU) es un dispositivo mdico que se coloca en el tero para impedir Firefighter. Es un dispositivo pequeo en forma de "T" del que cuelgan uno o dos hilos de [place]. Los hilos cuelgan de la parte inferior del tero (cuello uterino) para que el DIU pueda extraerse en el futuro. Hay dos tipos de DIU:  DIU de cobre. Este tipo de DIU est envuelto por un alambre de cobre. El cobre hace que el tero y las trompas de Falopio produzcan un lquido que [employer] espermatozoides. Un DIU de cobre puede durar hasta 10aos.  DIU hormonal. Este tipo de DIU est hecho de plstico y contiene la hormona progestina (progesterona sinttica). Esta hormona hace que la mucosidad del cuello uterino se haga ms espesa y evita que el esperma ingrese en el tero. Tambin hace que el endometrio sea ms delgado, lo que impide la implantacin del vulo fertilizado. La hormona debilita o destruye los espermatozoides que ingresan al tero. Un DIU hormonal puede durar entre 3 y 91aos. Informe al mdico acerca de lo siguiente:  Cualquier alergia que tenga.  Todos los [employer], incluidos vitaminas, hierbas, gotas oftlmicas, cremas y [address] de [address].  Cualquier problema que usted o sus familiares hayan tenido con anestsicos.  Cualquier enfermedad de la sangre que tenga.  Cirugas a las que se someti.  Todas las afecciones que tenga, incluidas las ITS (infecciones de transmisin sexual) que Production designer, theatre/television/film.  Si est embarazada o podra estarlo. Cules son los riesgos? En general, se trata de un procedimiento seguro. Sin embargo, pueden ocurrir complicaciones, por  ejemplo:  Infeccin.  Hemorragia.  Reacciones alrgicas a los medicamentos.  Puncin accidental (perforacin) en el tero o dao a otras estructuras u otros rganos.  La colocacin accidental del DIU en la capa muscular del tero (miometrio) o fuera del tero.  Que el DIU se salga del tero (expulsin). Esto es ms frecuente en las mujeres que han dado a luz recientemente.  Un embarazo que ocurre en las trompas de Falopio (embarazo ectpico).  Infeccin del tero y de las trompas de Falopio (enfermedad plvica inflamatoria). Qu ocurre antes del procedimiento?  Programe la colocacin del DIU para cuando tenga el perodo menstrual o inmediatamente despus para asegurarse de que no est embarazada. La colocacin del DIU se tolera mejor poco despus del ciclo menstrual.  Siga las indicaciones del mdico respecto de las restricciones de comidas o bebidas.  Consulte a su mdico si debe cambiar o suspender los medicamentos que toma habitualmente. Esto es muy importante si toma medicamentos para la diabetes o anticoagulantes.  Quizs le indiquen que tome un analgsico antes del procedimiento.  Es posible que deba hacerse estudios de lo siguiente: ? Psychiatrist. Neomia Dear prueba de Photographer una muestra de [place]. ? ITS. Colocar un DIU a una mujer que tiene una ITS puede empeorar la afeccin. ? Cncer de cuello uterino. Pueden realizarle una prueba de Papanicolaou para descartar este tipo de cncer. Para ello, se toman clulas del cuello uterino, que luego se examinan con un microscopio.  Es posible que Production manager un examen fsico para Therapist, occupational y la posicin del tero. Este procedimiento puede variar segn el mdico y el hospital. Ladell Heads ocurre durante el procedimiento?  Le colocarn un  instrumento (espculo) en la vagina, y este se ensanchar para que el mdico pueda ver el cuello uterino.  Es posible que le coloquen medicamentos en el cuello uterino para disminuir  el riesgo de contraer una infeccin (antisptico).  Tal vez le administren un anestsico para adormecer cada lado del cuello uterino (bloqueo intracervical o bloqueo paracervical). Este medicamento suele administrarse mediante una inyeccin en el cuello uterino.  Se introducir un instrumento (sonda uterina) en el tero para determinar la longitud del tero y la direccin hacia la cual el tero podra estar inclinado.  Se introducir un instrumento o un tubo delgado (insertador de DIU) que sostiene el DIU en la vagina, a travs del conducto endocervical, hasta el tero.  El DIU se colocar en el tero, y el insertador de DIU se Regulatory affairs officer.  Los hilos unidos al DIU se cortarn para que queden justo debajo del cuello uterino. Este procedimiento puede variar segn el mdico y el hospital. Sander Nephew ocurre despus del procedimiento?  Podr tener sangrado despus del procedimiento. Esto es normal. Vara desde un goteo ligero (manchado) durante un par de [employer] un sangrado similar al de la [place].  Puede sentir clicos y Social research officer, government.  Puede sentirse mareada o aturdida.  Puede sentir dolor en la parte inferior de la espalda. Resumen  Un dispositivo intrauterino (DIU) es un dispositivo pequeo en forma de "T" del que cuelgan uno o dos hilos de [place].  Hay dos tipos de DIU. Pueden colocarle un DIU de cobre o un DIU hormonal.  Programe la colocacin del DIU para cuando tenga el perodo menstrual o inmediatamente despus para asegurarse de que no est embarazada. La colocacin del DIU se tolera mejor poco despus del ciclo menstrual.  Podr tener sangrado despus del procedimiento. Esto es normal. Vara desde un goteo ligero durante un par de [employer] un sangrado similar al menstrual. Esta informacin no tiene Marine scientist el consejo del mdico. Asegrese de hacerle al mdico cualquier pregunta que tenga. Document Revised: 06/08/2017 Document Reviewed: 06/08/2017 Elsevier Patient  Education  2020 [employer].

## 2019-11-05 NOTE — Progress Notes (Signed)
Burning while urination and feels like wound is coming down, discharge and itchy

## 2019-11-05 NOTE — Progress Notes (Addendum)
Patient ID: Alexandra Aguirre, female    DOB: 01-Feb-1976  MRN: [id number]  CC: Vaginal pain  Subjective: Alexandra Aguirre is a 44 y.o. female with history of migraine, asthma, gastritis, gastroduodenitis, GERD, type 2 diabetes, back pain, cervical dysplasia, generalized anxiety disorder, vitamin D deficiency, hyperlipidemia, and IUD contraception who presents for vaginal pain. [employer] used during this encounter, Sol [id number].  1. VAGINAL PAIN: Patient complains of an abnormal malodorous white-brown vaginal discharge for 14 days. Vaginal symptoms include burning, dyspareunia, pain and urinary symptoms of burning with urination..Vulvar symptoms include none.STI Risk: Very low risk of STD exposure. Discharge described as: normal and physiologic.Other associated symptoms: none.Menstrual pattern: She had been bleeding regularly. Contraception: IUD.  Vaginal pain began 2 weeks ago when she fell in the backyard while feeding livestock.  Rates pain 8 out of 10, has taken Ibuprofen with some relief.  Reports if she presses on left suprapubic area it is more painful.  The vaginal pain began immediately after the fall but the vaginal discharge began about 3 days later.  Last seen by Dr. Madie Reno, obstetrician-gynecologist, in February 2020 for chronic cystitis with plan to remove IUD in July 2021.  Onset: 2 weeks ago Discharge: White brown, malodorous Itching: Admits Burning: Admits Vaginal Bleeding: Denies Pelvic Pain: Bilateral  Pain with Urination: Admits  Pain duration: 2 weeks Abdominal Pain: Denies LMP: 3 days ago began, typically last 4 days, light flow; monthly date varies Sexually active: Admits Pain with Intercourse: Admits Use protection: IUD placed 12 years ago Vaginal swelling, warts, lesions: Denies  2. DIABETES TYPE 2 FOLLOW-UP:  Last A1C: 8.9, February 2020  Med Adherence:  [x]  Yes    []  No Medication side effects:  []  Yes    [x]  No Home Monitoring?  []  Yes    [x]  No  Home glucose results range: Machine not working  Diet Adherence: [x]  Yes    []  No Exercise: []  Yes    [x]  No Hypoglycemic episodes?: []  Yes    [x]  No Numbness of the feet? []  Yes    [x]  No Retinopathy hx? []  Yes    [x]  No Last eye exam: Denies  Comments: Last seen December 2020 by physician assistant Mcclung during that encounter diabetic medications were refilled and follow-up in 1 month with attending physician.  Patient did not follow-up as counseled.  Patient Active Problem List   Diagnosis Date Noted  . IUD contraception 11/09/2018  . Acute tonsillitis 11/01/2018  . Acute hemorrhagic otitis externa of both ears 11/01/2018  . Non-recurrent acute suppurative otitis media of left ear without spontaneous rupture of tympanic membrane 11/01/2018  . Acute non-recurrent maxillary sinusitis 11/01/2018  . POP-Q stage 2 cystocele 09/20/2018  . Fatty liver 06/14/2018  . Hyperlipidemia 04/04/2018  . Dental caries 06/21/2017  . Vitamin D deficiency 03/01/2017  . Type 2 diabetes mellitus with complication, without long-term current use of insulin (HCC) 10/12/2016  . Gastroesophageal reflux disease 10/12/2016  . Generalized anxiety disorder 10/12/2016  . Asthma 01/15/2014  . Vertigo 11/25/2013  . History of cervical dysplasia 11/01/2013  . Pedal edema 09/30/2013  . Migraine headache 09/30/2013  . Gastritis and gastroduodenitis 09/30/2013  . Back pain 09/30/2013     Current Outpatient Medications on File Prior to Visit  Medication Sig Dispense Refill  . albuterol (VENTOLIN HFA) 108 (90 Base) MCG/ACT inhaler Inhale 2 puffs into the lungs every 6 (six) hours as needed for wheezing or shortness of breath. 18 g 0  .  atorvastatin (LIPITOR) 20 MG tablet Take 1 tablet (20 mg total) by mouth daily. 30 tablet 2  . Blood Glucose Monitoring Suppl (TRUE METRIX AIR GLUCOSE METER) DEVI 1 each by Does not apply route 3 (three) times daily. 1 Device 0  . cetirizine (ZYRTEC) 10 MG tablet Take 1 tablet (10  mg total) by mouth daily. 30 tablet 1  . dicyclomine (BENTYL) 20 MG tablet Take 1 tablet (20 mg total) by mouth 2 (two) times daily. 20 tablet 0  . fluticasone (FLONASE) 50 MCG/ACT nasal spray Place 2 sprays into both nostrils daily. 16 g 2  . glipiZIDE (GLUCOTROL) 10 MG tablet Take 1 tablet (10 mg total) by mouth 2 (two) times daily before a meal. 60 tablet 2  . glucose blood (TRUE METRIX BLOOD GLUCOSE TEST) test strip 1 each by Other route 3 (three) times daily. 100 each 12  . ibuprofen (ADVIL) 800 MG tablet Take 1 tablet (800 mg total) by mouth 3 (three) times daily. 21 tablet 0  . Insulin Glargine (LANTUS SOLOSTAR) 100 UNIT/ML Solostar Pen Inject 10 Units into the skin daily. 3 pen 6  . Insulin Pen Needle 31G X 5 MM MISC 1 each by Does not apply route at bedtime. 30 each 5  . metFORMIN (GLUCOPHAGE) 500 MG tablet Take orally 3 tabs (1500 mg) in the morning and 2 tabs (1000 mg) in the evening 150 tablet 2  . methocarbamol (ROBAXIN) 500 MG tablet Take 2 tablets (1,000 mg total) by mouth 3 (three) times daily. (Patient taking differently: Take 1,000 mg by mouth 3 (three) times daily as needed. ) 90 tablet 0  . mometasone-formoterol (DULERA) 100-5 MCG/ACT AERO Inhale 2 puffs into the lungs 2 (two) times daily. 13 g 6  . naproxen (NAPROSYN) 500 MG tablet Take 1 tablet (500 mg total) by mouth 2 (two) times daily as needed for moderate pain. 60 tablet 0  . olopatadine (PATANOL) 0.1 % ophthalmic solution Place 1 drop into both eyes 2 (two) times daily. 5 mL 2  . pantoprazole (PROTONIX) 40 MG tablet Take 1 tablet (40 mg total) by mouth daily. 30 tablet 6  . sitaGLIPtin (JANUVIA) 100 MG tablet Take 1 tablet (100 mg total) by mouth daily. 30 tablet 2  . triamcinolone cream (KENALOG) 0.1 % Apply 1 application topically 2 (two) times daily. 30 g 0  . TRUEPLUS LANCETS 28G MISC 1 each by Does not apply route 3 (three) times daily. 100 each 11   No current facility-administered medications on file prior to  visit.    Allergies  Allergen Reactions  . Fruit & Vegetable Daily [Nutritional Supplements] Other (See Comments)    "mouth rash" from Avocado, apple, peaches, plums, kiwi    Social History   Socioeconomic History  . Marital status: Single    Spouse name: Not on file  . Number of children: Not on file  . Years of education: Not on file  . Highest education level: Not on file  Occupational History  . Not on file  Tobacco Use  . Smoking status: Never Smoker  . Smokeless tobacco: Never Used  Substance and Sexual Activity  . Alcohol use: No  . Drug use: No  . Sexual activity: Yes    Birth control/protection: I.U.D.  Other Topics Concern  . Not on file  Social History Narrative  . Not on file   Social Determinants of Health   Financial Resource Strain:   . Difficulty of Paying Living Expenses: Not on file  Food Insecurity:   . Worried About Charity fundraiser in the Last Year: Not on file  . Ran Out of Food in the Last Year: Not on file  Transportation Needs:   . Lack of Transportation (Medical): Not on file  . Lack of Transportation (Non-Medical): Not on file  Physical Activity:   . Days of Exercise per Week: Not on file  . Minutes of Exercise per Session: Not on file  Stress:   . Feeling of Stress : Not on file  Social Connections:   . Frequency of Communication with Friends and Family: Not on file  . Frequency of Social Gatherings with Friends and Family: Not on file  . Attends Religious Services: Not on file  . Active Member of Clubs or Organizations: Not on file  . Attends Archivist Meetings: Not on file  . Marital Status: Not on file  Intimate Partner Violence:   . Fear of Current or Ex-Partner: Not on file  . Emotionally Abused: Not on file  . Physically Abused: Not on file  . Sexually Abused: Not on file    Family History  Problem Relation Age of Onset  . Cancer Mother        cervical/ovarian  . Diabetes Mother   . Diabetes Maternal  Uncle   . Diabetes Brother   . Diabetes Maternal Uncle   . Diabetes Maternal Uncle     Past Surgical History:  Procedure Laterality Date  . CERVICAL BIOPSY  W/ LOOP ELECTRODE EXCISION    . removal of cyst Right 2011    ROS: Review of Systems  Constitutional: Negative for chills and fever.  HENT: Negative for sore throat.   Respiratory: Negative for cough, shortness of breath and wheezing.   Cardiovascular: Negative for chest pain, palpitations and leg swelling.  Gastrointestinal: Negative for heartburn, nausea and vomiting.  Genitourinary: Positive for dysuria.  Neurological: Negative for headaches.  Negative except as stated above  PHYSICAL EXAM: Vitals with BMI 11/05/2019 08/20/2019 08/19/2019  Height 5\' 0"  - -  Weight 186 lbs - -  BMI 33.82 - -  Systolic 505 - 397  Diastolic 75 - 60  Pulse 76 76 82  SpO2: 99%  Physical Exam  General appearance - alert, well appearing, and in no distress and oriented to person, place, and time Mental status - alert, oriented to person, place, and time, normal mood, behavior, speech, dress, motor activity, and thought processes Chest - clear to auscultation, no wheezes, rales or rhonchi, symmetric air entry, no tachypnea, retractions or cyanosis Heart - normal rate, regular rhythm, normal S1, S2, no murmurs, rubs, clicks or gallops Abdomen - tenderness noted left suprapubic region, soft, nondistended Pelvic - normal external genitalia and vulva exam chaperoned by Doloris Hall, CMA. Pelvic exam not complete as patient was menstruating.  CMP Latest Ref Rng & Units 07/31/2019 10/27/2018 05/31/2018  Glucose 70 - 99 mg/dL 228(H) 346(H) 194(H)  BUN 6 - 20 mg/dL 11 6 7   Creatinine 0.44 - 1.00 mg/dL 0.59 0.64 0.55  Sodium 135 - 145 mmol/L 136 134(L) 136  Potassium 3.5 - 5.1 mmol/L 3.8 3.8 3.5  Chloride 98 - 111 mmol/L 104 100 102  CO2 22 - 32 mmol/L 22 22 22   Calcium 8.9 - 10.3 mg/dL 8.9 8.6(L) 9.2  Total Protein 6.5 - 8.1 g/dL 7.2 7.6 7.6   Total Bilirubin 0.3 - 1.2 mg/dL 0.4 0.7 0.5  Alkaline Phos 38 - 126 U/L 107 120 130(H)  AST 15 -  41 U/L 17 19 30   ALT 0 - 44 U/L 14 21 34   Lipid Panel     Component Value Date/Time   CHOL 191 03/09/2018 1147   CHOL 167 03/01/2017 1129   TRIG 190 (H) 03/09/2018 1147   HDL 31 (L) 03/09/2018 1147   HDL 31 (L) 03/01/2017 1129   CHOLHDL 6.2 03/09/2018 1147   VLDL 38 03/09/2018 1147   LDLCALC 122 (H) 03/09/2018 1147   LDLCALC 95 03/01/2017 1129    CBC    Component Value Date/Time   WBC 10.5 07/31/2019 2107   RBC 4.67 07/31/2019 2107   HGB 13.5 07/31/2019 2107   HGB 13.1 03/01/2017 1129   HCT 40.8 07/31/2019 2107   HCT 41.4 03/01/2017 1129   PLT 365 07/31/2019 2107   PLT 372 03/01/2017 1129   MCV 87.4 07/31/2019 2107   MCV 89 03/01/2017 1129   MCH 28.9 07/31/2019 2107   MCHC 33.1 07/31/2019 2107   RDW 13.1 07/31/2019 2107   RDW 13.5 03/01/2017 1129   LYMPHSABS 3.0 07/31/2019 2107   LYMPHSABS 1.8 03/01/2017 1129   MONOABS 0.7 07/31/2019 2107   EOSABS 0.3 07/31/2019 2107   EOSABS 0.3 03/01/2017 1129   BASOSABS 0.1 07/31/2019 2107   BASOSABS 0.0 03/01/2017 1129   Results for orders placed or performed in visit on 11/05/19  POCT URINALYSIS DIP (CLINITEK)  Result Value Ref Range   Color, UA yellow yellow   Clarity, UA clear clear   Glucose, UA >=1,000 (A) negative mg/dL   Bilirubin, UA negative negative   Ketones, POC UA negative negative mg/dL   Spec Grav, UA 11/07/19 9.373 - 1.025   Blood, UA moderate (A) negative   pH, UA 6.0 5.0 - 8.0   POC PROTEIN,UA negative negative, trace   Urobilinogen, UA 0.2 0.2 or 1.0 E.U./dL   Nitrite, UA Negative Negative   Leukocytes, UA Negative Negative    ASSESSMENT AND PLAN: 1. Vaginal pain: -IUD removal scheduled for 11/13/2019 patient preference -Urinalysis dip positive for moderate blood as patient is menstruating today. - Cervicovaginal ancillary only - POCT URINALYSIS DIP (CLINITEK) - naproxen (NAPROSYN) 500 MG tablet; Take  1 tablet (500 mg total) by mouth 2 (two) times daily with a meal.  Dispense: 60 tablet; Refill: 0  2. Type 2 diabetes mellitus with complication, without long-term current use of insulin (HCC): -Follow-up with attending physician in 1 month - Blood Glucose Monitoring Suppl (TRUE METRIX AIR GLUCOSE METER) DEVI; 1 each by Does not apply route 3 (three) times daily.  Dispense: 1 each; Refill: 0 - metFORMIN (GLUCOPHAGE) 500 MG tablet; Take orally 3 tabs (1500 mg) in the morning and 2 tabs (1000 mg) in the evening  Dispense: 150 tablet; Refill: 2 -To achieve an A1C goal of less than or equal to 7.0 percent, a fasting blood sugar of 80 to 130 mg/dL and a postprandial glucose (90 to 120 minutes after a meal) less than 180 mg/dL. -Discussed the importance of healthy low-carb and low-sugar eating habits, regular aerobic exercise (at least 150 minutes a week as tolerated) and medication compliance to achieve or maintain control of diabetes.  Patient was given the opportunity to ask questions.  Patient verbalized understanding of the plan and was able to repeat key elements of the plan.   No orders of the defined types were placed in this encounter.    Requested Prescriptions    No prescriptions requested or ordered in this encounter    Draylen Lobue 11/15/2019, NP

## 2019-11-06 LAB — CERVICOVAGINAL ANCILLARY ONLY
Bacterial Vaginitis (gardnerella): NEGATIVE
Candida Glabrata: NEGATIVE
Candida Vaginitis: POSITIVE — AB
Chlamydia: NEGATIVE
Comment: NEGATIVE
Comment: NEGATIVE
Comment: NEGATIVE
Comment: NEGATIVE
Comment: NEGATIVE
Comment: NORMAL
Neisseria Gonorrhea: NEGATIVE
Trichomonas: NEGATIVE

## 2019-11-06 MED ORDER — FLUCONAZOLE 150 MG PO TABS: 150.0000 mg | ORAL_TABLET | Freq: Once | ORAL | 0 refills | Status: AC

## 2019-11-06 NOTE — Progress Notes (Addendum)
Please call patient with update positive for candida, prescribed Fluconazole 150 mg single dose by mouth, sent to pharmacy. Follow-up is needed if symptoms do not resolve.   Please call patient with update she should report to emergency department for glucose in urine > 1000 to assess for possible diabetic ketoacidosis. Follow-up with primary provider next month for diabetes management.   Urinalysis positive for moderate blood as patient was on menstrual cycle.

## 2019-11-06 NOTE — Addendum Note (Signed)
Addended by: Rema Fendt on: 11/06/2019 02:23 PM   Modules accepted: Orders

## 2019-11-07 ENCOUNTER — Telehealth: Payer: Self-pay

## 2019-11-07 ENCOUNTER — Other Ambulatory Visit: Payer: Self-pay

## 2019-11-07 ENCOUNTER — Encounter (HOSPITAL_COMMUNITY): Payer: Self-pay | Admitting: Emergency Medicine

## 2019-11-07 ENCOUNTER — Emergency Department (HOSPITAL_COMMUNITY)
Admission: EM | Admit: 2019-11-07 | Discharge: 2019-11-07 | Disposition: A | Discharge status: Home / Self Care | Payer: Self-pay | Attending: Emergency Medicine | Admitting: Emergency Medicine

## 2019-11-07 DIAGNOSIS — Z79899 Other long term (current) drug therapy: Secondary | ICD-10-CM | POA: Insufficient documentation

## 2019-11-07 DIAGNOSIS — Z794 Long term (current) use of insulin: Secondary | ICD-10-CM | POA: Insufficient documentation

## 2019-11-07 DIAGNOSIS — E119 Type 2 diabetes mellitus without complications: Secondary | ICD-10-CM | POA: Insufficient documentation

## 2019-11-07 DIAGNOSIS — Z8709 Personal history of other diseases of the respiratory system: Secondary | ICD-10-CM | POA: Insufficient documentation

## 2019-11-07 DIAGNOSIS — L03011 Cellulitis of right finger: Secondary | ICD-10-CM | POA: Insufficient documentation

## 2019-11-07 DIAGNOSIS — R300 Dysuria: Secondary | ICD-10-CM | POA: Insufficient documentation

## 2019-11-07 DIAGNOSIS — R519 Headache, unspecified: Secondary | ICD-10-CM | POA: Insufficient documentation

## 2019-11-07 LAB — URINALYSIS, ROUTINE W REFLEX MICROSCOPIC
Bacteria, UA: NONE SEEN
Bilirubin Urine: NEGATIVE
Glucose, UA: >=500 mg/dL — AB
Ketones, ur: NEGATIVE mg/dL
Nitrite: NEGATIVE
Protein, ur: NEGATIVE mg/dL
Specific Gravity, Urine: 1.015 (ref 1.005–1.030)
pH: 6 (ref 5.0–8.0)

## 2019-11-07 LAB — BASIC METABOLIC PANEL
Anion gap: 8 (ref 5–15)
BUN: 10 mg/dL (ref 6–20)
CO2: 23 mmol/L (ref 22–32)
Calcium: 8.6 mg/dL — ABNORMAL LOW (ref 8.9–10.3)
Chloride: 103 mmol/L (ref 98–111)
Creatinine, Ser: 0.53 mg/dL (ref 0.44–1.00)
GFR calc Af Amer: >60 mL/min (ref >60)
GFR calc non Af Amer: >60 mL/min (ref >60)
Glucose, Bld: 252 mg/dL — ABNORMAL HIGH (ref 70–99)
Potassium: 4 mmol/L (ref 3.5–5.1)
Sodium: 134 mmol/L — ABNORMAL LOW (ref 135–145)

## 2019-11-07 LAB — CBC
HCT: 41.7 % (ref 36.0–46.0)
Hemoglobin: 13.7 g/dL (ref 12.0–15.0)
MCH: 29.2 pg (ref 26.0–34.0)
MCHC: 32.9 g/dL (ref 30.0–36.0)
MCV: 88.9 fL (ref 80.0–100.0)
Platelets: 390 10*3/uL (ref 150–400)
RBC: 4.69 MIL/uL (ref 3.87–5.11)
RDW: 12.8 % (ref 11.5–15.5)
WBC: 8.3 10*3/uL (ref 4.0–10.5)
nRBC: 0 % (ref 0.0–0.2)

## 2019-11-07 LAB — I-STAT BETA HCG BLOOD, ED (MC, WL, AP ONLY): I-stat hCG, quantitative: <5 m[IU]/mL (ref <5)

## 2019-11-07 LAB — CBG MONITORING, ED: Glucose-Capillary: 241 mg/dL — ABNORMAL HIGH (ref 70–99)

## 2019-11-07 MED ORDER — LACTATED RINGERS IV BOLUS
1000.0000 mL | Freq: Once | INTRAVENOUS | Status: AC
  Administered 2019-11-07: 17:00:00 1000 mL via INTRAVENOUS

## 2019-11-07 MED ORDER — PROCHLORPERAZINE EDISYLATE 10 MG/2ML IJ SOLN
10.0000 mg | Freq: Once | INTRAMUSCULAR | Status: AC
  Administered 2019-11-07: 10 mg via INTRAVENOUS
  Filled 2019-11-07: qty 2

## 2019-11-07 MED ORDER — LIDOCAINE HCL (PF) 1 % IJ SOLN
10.0000 mL | Freq: Once | INTRAMUSCULAR | Status: DC
  Filled 2019-11-07: qty 10

## 2019-11-07 MED ORDER — DIPHENHYDRAMINE HCL 50 MG/ML IJ SOLN
25.0000 mg | Freq: Once | INTRAMUSCULAR | Status: AC
  Administered 2019-11-07: 17:00:00 25 mg via INTRAVENOUS
  Filled 2019-11-07: qty 1

## 2019-11-07 MED ORDER — SODIUM CHLORIDE 0.9% FLUSH: 3.0000 mL | Freq: Once | INTRAVENOUS | Status: DC

## 2019-11-07 MED ORDER — LIDOCAINE HCL (PF) 1 % IJ SOLN
INTRAMUSCULAR | Status: AC
  Filled 2019-11-07: qty 5

## 2019-11-07 MED ORDER — AMOXICILLIN-POT CLAVULANATE 875-125 MG PO TABS: 1.0000 | ORAL_TABLET | Freq: Two times a day (BID) | ORAL | 0 refills | Status: AC

## 2019-11-07 NOTE — ED Triage Notes (Addendum)
Pt states her right ring finger is painful and slightly swollen. Pt also complains of feeling dizzy/ and having intermittent headache. No headache at this time. Pt fell out of the car as her ride opened the door to drop of her off. Pt did not hit head. Complains of some left arm pain after falling out of the care. Pt states she felt dizzy as she tried to get out and just fell out of the car. No LOC. Pt does not appear in distress.

## 2019-11-07 NOTE — Telephone Encounter (Signed)
Patient name and DOB has been verified Patient was informed of lab results. Patient had no questions.  

## 2019-11-07 NOTE — ED Notes (Signed)
Pt discharge, follow up, and prescription education provided via translator ipad. Pt verbalizes understanding. Pt is alert and oriented x 4 at discharge.

## 2019-11-07 NOTE — Telephone Encounter (Signed)
-----   Message from Rema Fendt, NP sent at 11/06/2019  2:21 PM EST ----- Positive for candida, prescribed Fluconazole 150 mg single dose by mouth, sent to pharmacy. Follow-up is needed if symptoms do not resolve.   Please call patient with update she should report to emergency department for glucose in urine > 1000 to assess for possible diabetic ketoacidosis. Follow-up with primary provider next month for diabetes management.   Urinalysis positive for moderate blood as patient was on menstrual cycle.

## 2019-11-07 NOTE — Discharge Instructions (Addendum)
Call your primary care doctor to recheck your finger.  Soak your finger in warm water daily.  Follow up with your OB/GYN for removal of your IUD as directed.

## 2019-11-07 NOTE — ED Provider Notes (Signed)
[facility] EMERGENCY DEPARTMENT Provider Note   CSN: [id number] Arrival date & time: 11/07/19  1544     History Chief Complaint  Patient presents with  . Hand Pain  . Headache  . Dizziness   Alexandra Aguirre is a 44 y.o. female.  The history is provided by the patient, medical records and the EMS personnel.  Illness Location:  Right ring finger Quality:  Sharp, pulsing, Red Severity:  Moderate Onset quality:  Gradual Duration:  2 weeks Timing:  Constant Progression:  Worsening Chronicity:  New Context:  Biting fingernails 2 weeks ago, worsening redness, pain and swelling since Relieved by:  Nothing Worsened by:  Palpation Associated symptoms: headaches   Associated symptoms: no abdominal pain, no chest pain, no diarrhea, no fever, no nausea, no shortness of breath and no vomiting   Headaches:    Severity:  Moderate   Onset quality:  Gradual   Duration:  1 day   Timing:  Constant   Progression:  Worsening   Chronicity:  New Dizziness Quality:  Room spinning Severity:  Moderate Onset quality:  Sudden Duration:  1 day Timing:  Constant Progression:  Resolved Chronicity:  Recurrent Associated symptoms: headaches   Associated symptoms: no chest pain, no diarrhea, no nausea, no shortness of breath and no vomiting   Risk factors: hx of vertigo        Past Medical History:  Diagnosis Date  . Abnormal Pap smear of cervix 09/24/2013   AGUS  . Asthma   . Diabetes mellitus without complication (HCC)   . Environmental allergies   . GERD (gastroesophageal reflux disease)     Patient Active Problem List   Diagnosis Date Noted  . IUD contraception 11/09/2018  . Acute tonsillitis 11/01/2018  . Acute hemorrhagic otitis externa of both ears 11/01/2018  . Non-recurrent acute suppurative otitis media of left ear without spontaneous rupture of tympanic membrane 11/01/2018  . Acute non-recurrent maxillary sinusitis 11/01/2018  . POP-Q stage 2  cystocele 09/20/2018  . Fatty liver 06/14/2018  . Hyperlipidemia 04/04/2018  . Dental caries 06/21/2017  . Vitamin D deficiency 03/01/2017  . Type 2 diabetes mellitus with complication, without long-term current use of insulin (HCC) 10/12/2016  . Gastroesophageal reflux disease 10/12/2016  . Generalized anxiety disorder 10/12/2016  . Asthma 01/15/2014  . Vertigo 11/25/2013  . History of cervical dysplasia 11/01/2013  . Pedal edema 09/30/2013  . Migraine headache 09/30/2013  . Gastritis and gastroduodenitis 09/30/2013  . Back pain 09/30/2013    Past Surgical History:  Procedure Laterality Date  . CERVICAL BIOPSY  W/ LOOP ELECTRODE EXCISION    . removal of cyst Right 2011     OB History    Gravida  5   Para  5   Term  5   Preterm      AB      Living  5     SAB      TAB      Ectopic      Multiple      Live Births              Family History  Problem Relation Age of Onset  . Cancer Mother        cervical/ovarian  . Diabetes Mother   . Diabetes Maternal Uncle   . Diabetes Brother   . Diabetes Maternal Uncle   . Diabetes Maternal Uncle     Social History   Tobacco Use  . Smoking status: Never  Smoker  . Smokeless tobacco: Never Used  Substance Use Topics  . Alcohol use: No  . Drug use: No    Home Medications Prior to Admission medications   Medication Sig Start Date End Date Taking? Authorizing Provider  albuterol (VENTOLIN HFA) 108 (90 Base) MCG/ACT inhaler Inhale 2 puffs into the lungs every 6 (six) hours as needed for wheezing or shortness of breath. 08/28/19  Yes Georgian Co M, PA-C  atorvastatin (LIPITOR) 20 MG tablet Take 1 tablet (20 mg total) by mouth daily. 08/28/19  Yes McClung, Angela M, PA-C  Blood Glucose Monitoring Suppl (TRUE METRIX AIR GLUCOSE METER) DEVI 1 each by Does not apply route 3 (three) times daily. 11/05/19  Yes Zonia Kief, Amy J, NP  glipiZIDE (GLUCOTROL) 10 MG tablet Take 1 tablet (10 mg total) by mouth 2 (two) times  daily before a meal. 08/28/19  Yes McClung, Angela M, PA-C  glucose blood (TRUE METRIX BLOOD GLUCOSE TEST) test strip 1 each by Other route 3 (three) times daily. 08/02/18  Yes McClung, Angela M, PA-C  Insulin Glargine (LANTUS SOLOSTAR) 100 UNIT/ML Solostar Pen Inject 10 Units into the skin daily. 08/28/19  Yes McClung, Angela M, PA-C  Insulin Pen Needle 31G X 5 MM MISC 1 each by Does not apply route at bedtime. 08/28/19  Yes McClung, Angela M, PA-C  metFORMIN (GLUCOPHAGE) 500 MG tablet Take orally 3 tabs (1500 mg) in the morning and 2 tabs (1000 mg) in the evening Patient taking differently: Take 1,000-1,500 mg by mouth See admin instructions. Take 1500 mg in the morning and 1000 mg in the evening 11/05/19  Yes Zonia Kief, Amy J, NP  naproxen (NAPROSYN) 500 MG tablet Take 1 tablet (500 mg total) by mouth 2 (two) times daily with a meal. 11/05/19  Yes Zonia Kief, Amy J, NP  pantoprazole (PROTONIX) 40 MG tablet Take 1 tablet (40 mg total) by mouth daily. 11/01/18  Yes Storm Frisk, MD  sitaGLIPtin (JANUVIA) 100 MG tablet Take 1 tablet (100 mg total) by mouth daily. 08/28/19  Yes McClung, Angela M, PA-C  TRUEplus Lancets 28G MISC 1 EACH BY DOES NOT APPLY ROUTE 3 (THREE) TIMES DAILY. 11/05/19  Yes Hoy Register, MD  amoxicillin-clavulanate (AUGMENTIN) 875-125 MG tablet Take 1 tablet by mouth every 12 (twelve) hours for 7 days. 11/07/19 11/14/19  Leilene Diprima, [place], MD  cetirizine (ZYRTEC) 10 MG tablet Take 1 tablet (10 mg total) by mouth daily. Patient not taking: Reported on 11/07/2019 12/24/18   Hoy Register, MD  dicyclomine (BENTYL) 20 MG tablet Take 1 tablet (20 mg total) by mouth 2 (two) times daily. Patient not taking: Reported on 11/07/2019 10/27/18   Gerhard Munch, MD  fluconazole (DIFLUCAN) 150 MG tablet Take 150 mg by mouth once. 11/06/19   [provider]  fluticasone (FLONASE) 50 MCG/ACT nasal spray Place 2 sprays into both nostrils daily. Patient not taking: Reported on 11/07/2019 12/24/18    Hoy Register, MD  ibuprofen (ADVIL) 800 MG tablet Take 1 tablet (800 mg total) by mouth 3 (three) times daily. Patient not taking: Reported on 11/07/2019 08/19/19   Roxy Horseman, PA-C  methocarbamol (ROBAXIN) 500 MG tablet Take 2 tablets (1,000 mg total) by mouth 3 (three) times daily. Patient not taking: Reported on 11/07/2019 06/14/18   Anders Simmonds, PA-C  mometasone-formoterol [facility]) 100-5 MCG/ACT AERO Inhale 2 puffs into the lungs 2 (two) times daily. Patient not taking: Reported on 11/07/2019 02/12/19   Hoy Register, MD  naproxen (NAPROSYN) 500 MG tablet Take 1 tablet (  500 mg total) by mouth 2 (two) times daily as needed for moderate pain. Patient not taking: Reported on 11/07/2019 11/01/18   Storm Frisk, MD  olopatadine (PATANOL) 0.1 % ophthalmic solution Place 1 drop into both eyes 2 (two) times daily. Patient not taking: Reported on 11/07/2019 12/24/18   Hoy Register, MD  triamcinolone cream (KENALOG) 0.1 % Apply 1 application topically 2 (two) times daily. Patient not taking: Reported on 11/07/2019 10/29/18   Hoy Register, MD    Allergies    Fruit & vegetable daily [nutritional supplements]  Review of Systems   Review of Systems  Constitutional: Negative for fever.  Respiratory: Negative for shortness of breath.   Cardiovascular: Negative for chest pain.  Gastrointestinal: Negative for abdominal pain, diarrhea, nausea and vomiting.  Genitourinary: Positive for dysuria and flank pain.  Neurological: Positive for dizziness and headaches.  All other systems reviewed and are negative.   Physical Exam Updated Vital Signs BP 106/60 (BP Location: Right Arm)   Pulse 70   Temp 98.4 F (36.9 C) (Oral)   Resp 20   SpO2 99%   Physical Exam Vitals and nursing note reviewed.  Constitutional:      Appearance: She is well-developed. She is not toxic-appearing or diaphoretic.  HENT:     Head: Normocephalic and atraumatic.  Eyes:     Extraocular Movements:  Extraocular movements intact.     Conjunctiva/sclera: Conjunctivae normal.     Pupils: Pupils are equal, round, and reactive to light.  Cardiovascular:     Rate and Rhythm: Normal rate and regular rhythm.     Heart sounds: Normal heart sounds. No murmur.  Pulmonary:     Effort: Pulmonary effort is normal. No respiratory distress.     Breath sounds: Normal breath sounds.  Abdominal:     Palpations: Abdomen is soft.     Tenderness: There is no abdominal tenderness.  Musculoskeletal:     Cervical back: Neck supple.  Skin:    General: Skin is warm and dry.     Comments: Area of erythema, TTP and fluctuance to right 4th finger nail fold consistent with paronychia.   Neurological:     Mental Status: She is alert.     GCS: GCS eye subscore is 4. GCS verbal subscore is 5. GCS motor subscore is 6.     Cranial Nerves: No cranial nerve deficit, dysarthria or facial asymmetry.     Sensory: No sensory deficit.     Motor: No weakness.     Coordination: Coordination normal.     Gait: Gait normal.     Deep Tendon Reflexes: Reflexes normal.  Psychiatric:        Mood and Affect: Mood normal.        Behavior: Behavior normal.     ED Results / Procedures / Treatments   Labs (all labs ordered are listed, but only abnormal results are displayed) Labs Reviewed  BASIC METABOLIC PANEL - Abnormal; Notable for the following components:      Result Value   Sodium 134 (*)    Glucose, Bld 252 (*)    Calcium 8.6 (*)    All other components within normal limits  URINALYSIS, ROUTINE W REFLEX MICROSCOPIC - Abnormal; Notable for the following components:   Color, Urine STRAW (*)    Glucose, UA >=500 (*)    Hgb urine dipstick MODERATE (*)    Leukocytes,Ua TRACE (*)    All other components within normal limits  CBG MONITORING, ED - Abnormal;  Notable for the following components:   Glucose-Capillary 241 (*)    All other components within normal limits  CBC  I-STAT BETA HCG BLOOD, ED ([facility], [facility], [facility] ONLY)      EKG EKG Interpretation  Date/Time:  Thursday November 07 2019 15:55:09 EST Ventricular Rate:  81 PR Interval:  148 QRS Duration: 84 QT Interval:  386 QTC Calculation: 448 R Axis:   42 Text Interpretation: Normal sinus rhythm no acute ST/T changes no significant change since 2017 Confirmed by Pricilla Loveless [phone]) on 11/07/2019 4:12:17 PM   Radiology No results found.  Procedures .Marland KitchenIncision and Drainage  Date/Time: 11/07/2019 5:06 PM Performed by: Akul Leggette, [place], MD Authorized by: Pricilla Loveless, MD   Consent:    Consent obtained:  Verbal   Consent given by:  Patient   Risks discussed:  Pain, infection, bleeding and incomplete drainage   Alternatives discussed:  No treatment Location:    Indications for incision and drainage: Paronychia.   Size:  1cm x 1cm   Location:  Upper extremity   Upper extremity location:  Finger   Finger location:  R ring finger Pre-procedure details:    Procedure prep: alcohol. Anesthesia (see MAR for exact dosages):    Anesthesia method:  Nerve block   Block location:  Digital block right ring finger   Block needle gauge:  25 G   Block anesthetic:  Lidocaine 1% w/o epi   Block technique:  Digital block   Block injection procedure:  Anatomic landmarks identified, introduced needle, anatomic landmarks palpated, negative aspiration for blood and incremental injection   Block outcome:  Anesthesia achieved Procedure type:    Complexity:  Simple Procedure details:    Incision types:  Stab incision   Incision depth:  Dermal   Scalpel blade:  11   Wound management:  Probed and deloculated   Drainage:  Purulent and bloody   Drainage amount:  Scant   Wound treatment:  Wound left open   Packing materials:  None Post-procedure details:    Patient tolerance of procedure:  Tolerated well, no immediate complications   (including critical care time)  Medications Ordered in ED Medications  sodium chloride flush (NS) 0.9 % injection 3 mL (3  mLs Intravenous Not Given 11/07/19 1730)  lidocaine (PF) (XYLOCAINE) 1 % injection 10 mL (has no administration in time range)  lidocaine (PF) (XYLOCAINE) 1 % injection (has no administration in time range)  lactated ringers bolus 1,000 mL (0 mLs Intravenous Stopped 11/07/19 1913)  prochlorperazine (COMPAZINE) injection 10 mg (10 mg Intravenous Given 11/07/19 1727)  diphenhydrAMINE (BENADRYL) injection 25 mg (25 mg Intravenous Given 11/07/19 1726)    ED Course  I have reviewed the triage vital signs and the nursing notes.  Pertinent labs & imaging results that were available during my care of the patient were reviewed by me and considered in my medical decision making (see chart for details).    MDM Rules/Calculators/A&P                      Mariacamila Morgart is a 44 y.o. female with past medical history of migraine headaches, diabetes, vertigo, asthma, anxiety, IUD contraception who presents to the ED for multiple complaints.  The patient's main complaint is pain to the tip of her right ring finger.  Exam consistent with paronychia and incised and drained as above, no evidence of felon or flexor tenosynovitis.  Placed on Augmentin as the patient is diabetic and high risk for poor  wound healing.  Patient also with complaint of headache, not maximal at onset, without fever or neck stiffness as well as vertigo which has resolved.  She has a nonfocal neuro exam and is able to ambulate in the emergency department without difficulty.  As the headache had a gradual onset have a low suspicion of subarachnoid hemorrhage, no fevers or altered mental status, low suspicion of meningitis, headache resolved with Compazine, Benadryl, fluids in the emergency department.  She also reports that she fell out of her car when she got here and initially had some back pain which is now resolved she has no evidence of trauma on exam and is able to ambulate without difficulty.  Patient also reports that she has vaginal itching  and an infection and that "something is coming out of my vagina".  Record review reveals that the patient was called in a prescription for Diflucan by her primary physician today, the patient is also scheduled to have her IUD removed in 6 days.  She has no abdominal pain, her abdomen is soft and nontender, her urine is without evidence of infection on urinalysis.  On reevaluation the patient is resting comfortably in no distress, will discharge home and have follow-up with her primary care provider to reassess her paronychia.  Strict return precautions provided, discharged in stable condition.   Final Clinical Impression(s) / ED Diagnoses Final diagnoses:  Paronychia of finger of right hand  Acute nonintractable headache, unspecified headache type    Rx / DC Orders ED Discharge Orders         Ordered    amoxicillin-clavulanate (AUGMENTIN) 875-125 MG tablet  Every 12 hours     11/07/19 1846           Lathen Seal, [place], MD 11/07/19 2019    Sherwood Gambler, MD 11/07/19 2255

## 2019-11-11 NOTE — Progress Notes (Deleted)
Patient ID: Alexandra Aguirre, female   DOB: 1976/05/14, 44 y.o.   MRN: [id number]  After ED visit 11/06/2018 for paronychia.  I&D performed.    From ED A/P: Alexandra Aguirre is a 44 y.o. female with past medical history of migraine headaches, diabetes, vertigo, asthma, anxiety, IUD contraception who presents to the ED for multiple complaints.  The patient's main complaint is pain to the tip of her right ring finger.  Exam consistent with paronychia and incised and drained as above, no evidence of felon or flexor tenosynovitis.  Placed on Augmentin as the patient is diabetic and high risk for poor wound healing.  Patient also with complaint of headache, not maximal at onset, without fever or neck stiffness as well as vertigo which has resolved.  She has a nonfocal neuro exam and is able to ambulate in the emergency department without difficulty.  As the headache had a gradual onset have a low suspicion of subarachnoid hemorrhage, no fevers or altered mental status, low suspicion of meningitis, headache resolved with Compazine, Benadryl, fluids in the emergency department.  She also reports that she fell out of her car when she got here and initially had some back pain which is now resolved she has no evidence of trauma on exam and is able to ambulate without difficulty.  Patient also reports that she has vaginal itching and an infection and that "something is coming out of my vagina".  Record review reveals that the patient was called in a prescription for Diflucan by her primary physician today, the patient is also scheduled to have her IUD removed in 6 days.  She has no abdominal pain, her abdomen is soft and nontender, her urine is without evidence of infection on urinalysis.  On reevaluation the patient is resting comfortably in no distress, will discharge home and have follow-up with her primary care provider to reassess her paronychia.  Strict return precautions provided, discharged in stable condition.

## 2019-11-13 ENCOUNTER — Ambulatory Visit: Payer: Self-pay

## 2019-11-14 ENCOUNTER — Ambulatory Visit: Payer: Self-pay | Attending: Family Medicine | Admitting: Physician Assistant

## 2019-11-14 ENCOUNTER — Other Ambulatory Visit: Payer: Self-pay

## 2019-11-14 ENCOUNTER — Other Ambulatory Visit: Payer: Self-pay | Admitting: Pharmacist

## 2019-11-14 VITALS — BP 115/90 | HR 72 | Temp 98.1°F | Resp 18 | Wt 184.0 lb

## 2019-11-14 DIAGNOSIS — B009 Herpesviral infection, unspecified: Secondary | ICD-10-CM

## 2019-11-14 DIAGNOSIS — Z30432 Encounter for removal of intrauterine contraceptive device: Secondary | ICD-10-CM

## 2019-11-14 DIAGNOSIS — E118 Type 2 diabetes mellitus with unspecified complications: Secondary | ICD-10-CM

## 2019-11-14 DIAGNOSIS — K219 Gastro-esophageal reflux disease without esophagitis: Secondary | ICD-10-CM

## 2019-11-14 DIAGNOSIS — Z3002 Counseling and instruction in natural family planning to avoid pregnancy: Secondary | ICD-10-CM

## 2019-11-14 DIAGNOSIS — B379 Candidiasis, unspecified: Secondary | ICD-10-CM

## 2019-11-14 DIAGNOSIS — Z09 Encounter for follow-up examination after completed treatment for conditions other than malignant neoplasm: Secondary | ICD-10-CM

## 2019-11-14 LAB — POCT GLYCOSYLATED HEMOGLOBIN (HGB A1C): Hemoglobin A1C: 9.6 % — AB (ref 4.0–5.6)

## 2019-11-14 LAB — GLUCOSE, POCT (MANUAL RESULT ENTRY): POC Glucose: 274 mg/dl — AB (ref 70–99)

## 2019-11-14 MED ORDER — LANTUS SOLOSTAR 100 UNIT/ML ~~LOC~~ SOPN: 15.0000 [IU] | PEN_INJECTOR | Freq: Every day | SUBCUTANEOUS | 6 refills | Status: DC

## 2019-11-14 MED ORDER — VALACYCLOVIR HCL 1 G PO TABS: 1000.0000 mg | ORAL_TABLET | Freq: Three times a day (TID) | ORAL | 0 refills | Status: DC

## 2019-11-14 MED ORDER — FLUCONAZOLE 150 MG PO TABS: 150.0000 mg | ORAL_TABLET | Freq: Once | ORAL | 0 refills | Status: AC

## 2019-11-14 MED ORDER — PANTOPRAZOLE SODIUM 40 MG PO TBEC: 40.0000 mg | DELAYED_RELEASE_TABLET | Freq: Every day | ORAL | 2 refills | Status: DC

## 2019-11-14 NOTE — Progress Notes (Signed)
IUD removal GBS 274 had breakfast 1 hr ago  A1C9.6

## 2019-11-14 NOTE — Progress Notes (Signed)
Patient ID: Alexandra Aguirre, female   DOB: 1976/05/23, 44 y.o.   MRN: [id number]   Alexandra Aguirre, is a 44 y.o. female  [id number]:[id number]  [id number]  DOB - [birth year]  Subjective:  Chief Complaint and HPI: Alexandra Aguirre is a 44 y.o. female here today for a follow up visit after being seen in the ED for paronychia.  I&D performed and antibiotics prescribed.  She is doing well from that.    Wants IUD removal.  She has had the IUD for 13 years.  Also has some painful sores in the vaginal region.  Some itching as well.  STD testing neg except yeast on 11/05/2019.  No fever.  No pelvic pain  ED/Hospital notes reviewed.    ROS:   Constitutional:  No f/c, No night sweats, No unexplained weight loss. EENT:  No vision changes, No blurry vision, No hearing changes. No mouth, throat, or ear problems.  Respiratory: No cough, No SOB Cardiac: No CP, no palpitations GI:  No abd pain, No N/V/D. GU: No Urinary s/sx Musculoskeletal: No joint pain Neuro: No headache, no dizziness, no motor weakness.  Skin: No rash Endocrine:  No polydipsia. No polyuria.  Psych: Denies SI/HI  No problems updated.  ALLERGIES: Allergies  Allergen Reactions  . Fruit & Vegetable Daily [Nutritional Supplements] Other (See Comments)    "mouth rash" from Avocado, apple, peaches, plums, kiwi    PAST MEDICAL HISTORY: Past Medical History:  Diagnosis Date  . Abnormal Pap smear of cervix 09/24/2013   AGUS  . Asthma   . Diabetes mellitus without complication ([place])   . Environmental allergies   . GERD (gastroesophageal reflux disease)     MEDICATIONS AT HOME: Prior to Admission medications   Medication Sig Start Date End Date Taking? Authorizing Provider  albuterol (VENTOLIN HFA) 108 (90 Base) MCG/ACT inhaler Inhale 2 puffs into the lungs every 6 (six) hours as needed for wheezing or shortness of breath. 08/28/19  Yes Freeman Caldron M, PA-C  amoxicillin-clavulanate (AUGMENTIN) 875-125 MG tablet Take 1  tablet by mouth every 12 (twelve) hours for 7 days. 11/07/19 11/14/19 Yes Hunter, [place], MD  atorvastatin (LIPITOR) 20 MG tablet Take 1 tablet (20 mg total) by mouth daily. 08/28/19  Yes Ramiro Pangilinan M, PA-C  Blood Glucose Monitoring Suppl (TRUE METRIX AIR GLUCOSE METER) DEVI 1 each by Does not apply route 3 (three) times daily. 11/05/19  Yes Minette Brine, Amy J, NP  cetirizine (ZYRTEC) 10 MG tablet Take 1 tablet (10 mg total) by mouth daily. 12/24/18  Yes Charlott Rakes, MD  glipiZIDE (GLUCOTROL) 10 MG tablet Take 1 tablet (10 mg total) by mouth 2 (two) times daily before a meal. 08/28/19  Yes Dorea Duff M, PA-C  glucose blood (TRUE METRIX BLOOD GLUCOSE TEST) test strip 1 each by Other route 3 (three) times daily. 08/02/18  Yes Samson Ralph M, PA-C  Insulin Glargine (LANTUS SOLOSTAR) 100 UNIT/ML Solostar Pen Inject 15 Units into the skin daily. 11/14/19  Yes Zalma Channing M, PA-C  Insulin Pen Needle 31G X 5 MM MISC 1 each by Does not apply route at bedtime. 08/28/19  Yes Meigan Pates M, PA-C  metFORMIN (GLUCOPHAGE) 500 MG tablet Take orally 3 tabs (1500 mg) in the morning and 2 tabs (1000 mg) in the evening Patient taking differently: Take 1,000-1,500 mg by mouth See admin instructions. Take 1500 mg in the morning and 1000 mg in the evening 11/05/19  Yes Camillia Herter, NP  methocarbamol (ROBAXIN) 500 MG tablet  Take 2 tablets (1,000 mg total) by mouth 3 (three) times daily. 06/14/18  Yes Mikel Hardgrove M, PA-C  mometasone-formoterol (DULERA) 100-5 MCG/ACT AERO Inhale 2 puffs into the lungs 2 (two) times daily. 02/12/19  Yes Hoy Register, MD  naproxen (NAPROSYN) 500 MG tablet Take 1 tablet (500 mg total) by mouth 2 (two) times daily with a meal. 11/05/19  Yes Zonia Kief, Amy J, NP  pantoprazole (PROTONIX) 40 MG tablet Take 1 tablet (40 mg total) by mouth daily. 11/01/18  Yes Storm Frisk, MD  sitaGLIPtin (JANUVIA) 100 MG tablet Take 1 tablet (100 mg total) by mouth daily. 08/28/19  Yes Jakerra Floyd,  Janye Maynor M, PA-C  TRUEplus Lancets 28G MISC 1 EACH BY DOES NOT APPLY ROUTE 3 (THREE) TIMES DAILY. 11/05/19  Yes Hoy Register, MD  dicyclomine (BENTYL) 20 MG tablet Take 1 tablet (20 mg total) by mouth 2 (two) times daily. Patient not taking: Reported on 11/07/2019 10/27/18   Gerhard Munch, MD  fluconazole (DIFLUCAN) 150 MG tablet Take 150 mg by mouth once. 11/06/19   [provider]  fluconazole (DIFLUCAN) 150 MG tablet Take 1 tablet (150 mg total) by mouth once for 1 dose. 11/14/19 11/14/19  Anders Simmonds, PA-C  fluticasone (FLONASE) 50 MCG/ACT nasal spray Place 2 sprays into both nostrils daily. Patient not taking: Reported on 11/07/2019 12/24/18   Hoy Register, MD  ibuprofen (ADVIL) 800 MG tablet Take 1 tablet (800 mg total) by mouth 3 (three) times daily. Patient not taking: Reported on 11/07/2019 08/19/19   Roxy Horseman, PA-C  naproxen (NAPROSYN) 500 MG tablet Take 1 tablet (500 mg total) by mouth 2 (two) times daily as needed for moderate pain. Patient not taking: Reported on 11/07/2019 11/01/18   Storm Frisk, MD  olopatadine (PATANOL) 0.1 % ophthalmic solution Place 1 drop into both eyes 2 (two) times daily. Patient not taking: Reported on 11/07/2019 12/24/18   Hoy Register, MD  triamcinolone cream (KENALOG) 0.1 % Apply 1 application topically 2 (two) times daily. Patient not taking: Reported on 11/07/2019 10/29/18   Hoy Register, MD  valACYclovir (VALTREX) 1000 MG tablet Take 1 tablet (1,000 mg total) by mouth 3 (three) times daily. 11/14/19   Anders Simmonds, PA-C     Objective:  EXAM:   Vitals:   11/14/19 0906  BP: 115/90  Pulse: 72  Resp: 18  Temp: 98.1 F (36.7 C)  SpO2: 99%  Weight: 184 lb (83.5 kg)    General appearance : A&OX3. NAD. Non-toxic-appearing HEENT: Atraumatic and Normocephalic.  PERRLA. EOM intact.  Chest/Lungs:  Breathing-non-labored, Good air entry bilaterally, breath sounds normal without rales, rhonchi, or wheezing  CVS: S1 S2  regular, no murmurs, gallops, rubs  GU:  external genitalia-there are some ulcerations present along the perineum.  Viral culture taken.  Speculum inserted.  IUD visualized and grasped with ringed forceps.  Removed without problem.   Extremities: Bilateral Lower Ext shows no edema, both legs are warm to touch with = pulse throughout Neurology:  CN II-XII grossly intact, Non focal.   Psych:  TP linear. J/I WNL. Normal speech. Appropriate eye contact and affect.  Skin:  No Rash  Data Review Lab Results  Component Value Date   HGBA1C 9.6 (A) 11/14/2019   HGBA1C 8.9 (A) 10/29/2018   HGBA1C 8.5 (A) 06/14/2018     Assessment & Plan   1. Type 2 diabetes mellitus with complication, without long-term current use of insulin (HCC) Uncontrolled.  A1C=9.6 today.  Increase dose lantus.  Again  reviewed diabetic diet and the importance of adherence to that and medication regimen.  Check blood sugars bid and see Alexandra Aguirre in 3 weeks.   Continue metformin. - Glucose (CBG) - POCT glycosylated hemoglobin (Hb A1C) - Insulin Glargine (LANTUS SOLOSTAR) 100 UNIT/ML Solostar Pen; Inject 15 Units into the skin daily.  Dispense: 3 pen; Refill: 6  2. Encounter for IUD removal Done without sequelae  3. Counseling for birth control, natural family planning Use condoms for now;  See gyn for IUD  4. Herpes (appears to be-she has no history of this) - valACYclovir (VALTREX) 1000 MG tablet; Take 1 tablet (1,000 mg total) by mouth 3 (three) times daily.  Dispense: 21 tablet; Refill: 0 - Virus culture  5. Yeast infection - fluconazole (DIFLUCAN) 150 MG tablet; Take 1 tablet (150 mg total) by mouth once for 1 dose.  Dispense: 1 tablet; Refill: 0  6. Encounter for examination following treatment at hospital   Patient have been counseled extensively about nutrition and exercise  Return in about 3 weeks (around 12/05/2019) for with [facility] for DM;  3 months PCP.  The patient was given clear instructions to go to ER or  return to medical center if symptoms don't improve, worsen or new problems develop. The patient verbalized understanding. The patient was told to call to get lab results if they haven't heard anything in the next week.     Georgian Co, PA-C [facility] [place], [place] [phone]   11/14/2019, 9:38 AM

## 2019-11-14 NOTE — Patient Instructions (Addendum)
Check blood sugars twice daily and record and bring to next visit.    Also, make an appt with you OB/GYN for IUD insertion.  Use condoms until then   [employer] del mtodo anticonceptivo Contraception Choices La anticoncepcin, o los mtodos anticonceptivos, hace referencia a los mtodos o dispositivos que evitan el [place]. Mtodos hormonales Implante anticonceptivo  Un implante anticonceptivo consiste en un tubo plstico delgado que contiene una hormona. Se inserta en la parte superior del brazo. Puede Consulting civil engineer por 3 aos. Inyecciones de progestina sola Las inyecciones de progestina sola contienen progestina, una forma sinttica de la hormona progesterona. Un mdico las administra cada 3 meses. Pldoras anticonceptivas  Las pldoras anticonceptivas son pastillas que contienen hormonas que evitan el [place]. Deben tomarse una vez al da, preferentemente a la misma Economist. Parches anticonceptivos  El parche anticonceptivo contiene hormonas que evitan el [place]. Se coloca en la piel, debe cambiarse una vez a la semana durante tres semanas y debe retirarse en la cuarta semana. Se necesita una receta para utilizar este mtodo anticonceptivo. Anillo vaginal  Un anillo vaginal contiene hormonas que evitan el embarazo. Se coloca en la vagina durante tres semanas y se retira en la cuarta semana. Luego se repite el proceso con un anillo nuevo. Se necesita una receta para utilizar este mtodo anticonceptivo. Anticonceptivo de emergencia Los anticonceptivos de emergencia son mtodos para evitar un embarazo despus de Warehouse manager sexo sin proteccin. Vienen en forma de pldora y pueden tomarse hasta 5 das despus de [place]. Funcionan mejor cuando se toman lo ms pronto posible luego de eBay. La mayora de los anticonceptivos de emergencia estn disponibles sin receta mdica. Este mtodo no debe utilizarse como el nico mtodo anticonceptivo. Mtodos de  barrera Preservativo masculino  Un preservativo masculino es una vaina delgada que se coloca sobre el pene durante el sexo. Los preservativos evitan que el esperma ingrese en el cuerpo de la [place]. Pueden utilizarse con un espermicida para aumentar la efectividad. Deben desecharse luego de su uso. Preservativo femenino  Un preservativo femenino es una vaina blanda y holgada que se coloca en la vagina antes de [place]. El preservativo evita que el esperma ingrese en el cuerpo de la [place]. Deben desecharse luego de su uso. Diafragma  Un diafragma es una barrera blanda con forma de cpula. Se inserta en la vagina antes del sexo, junto con un espermicida. El diafragma bloquea el ingreso de esperma en el tero, y el espermicida mata a los espermatozoides. El Designer, fashion/clothing en la vagina durante 6 a 8 horas despus de Warehouse manager sexo y debe retirarse en el plazo de las 24 horas. Un diafragma es recetado y colocado por un mdico. Debe reemplazarse cada 1 a 2 aos, despus de dar a luz, de aumentar ms de 15lb (6,8kg) y de [place] plvica. Capuchn cervical  Un capuchn cervical es una copa redonda y blanda de ltex o plstico que se coloca en el cuello uterino. Se inserta en la vagina antes del sexo, junto con un espermicida. Bloquea el ingreso del esperma en el tero. El capuchn Radio producer durante 6 a 8 horas despus de Warehouse manager sexo y debe retirarse en el plazo de las 48 horas. Un capuchn cervical debe ser recetado y colocado por un mdico. Debe reemplazarse cada 2aos. Esponja  Una esponja es una pieza blanda y circular de espuma de poliuretano que contiene espermicida. La esponja ayuda a bloquear el ingreso de  esperma en el tero, y el espermicida mata a los espermatozoides. Marylyn Ishihara, debe humedecerla e insertarla en la vagina. Debe insertarse antes de Merrill Lynch, debe permanecer dentro al menos durante 6 horas despus de tener sexo y debe retirarse y Aeronautical engineer en  el plazo de las 30 horas. Espermicidas Los espermicidas son sustancias qumicas que matan o bloquean al esperma y no lo dejan ingresar al cuello uterino y al tero. Vienen en forma de crema, gel, supositorio, espuma o comprimido. Un espermicida debe insertarse en la vagina con un aplicador al menos 10 o 15 minutos antes de tener sexo para dar tiempo a que surta [place]. El proceso debe repetirse cada vez que tenga sexo. Los espermicidas no requieren Furniture conservator/restorer. Anticonceptivos intrauterinos Dispositivo intrauterino (DIU). Un DIU es un dispositivo en forma de T que se coloca en el tero. Existen dos tipos:  DIU hormonal.Este tipo contiene progestina, una forma sinttica de la hormona progesterona. Este tipo puede permanecer colocado durante 3 a 5 aos.  DIU de cobre.Este tipo est recubierto con un alambre de cobre. Puede permanecer colocado durante 10 aos.  Mtodos anticonceptivos permanentes Ligadura de trompas en la mujer En este mtodo, se sellan, atan u obstruyen las trompas de Falopio durante una ciruga para Product/process development scientist que el vulo descienda [place]. Esterilizacin histeroscpica En este mtodo, se coloca un implante pequeo y flexible dentro de cada trompa de Falopio. Los implantes hacen que se forme un tejido cicatricial en las trompas de Falopio y que las obstruya para que el espermatozoide no pueda llegar al vulo. El procedimiento demora alrededor de 3 meses para que sea [place]. Debe utilizarse otro mtodo anticonceptivo durante esos 3 meses. Esterilizacin masculina Este es un procedimiento que consiste en atar los conductos que transportan el esperma (vasectoma). Luego del procedimiento, el hombre Equities trader lquido (semen). Mtodos de planificacin natural Planificacin familiar natural En este mtodo, la pareja no tiene SPX Corporation la mujer podra quedar [place]. Mtodo calendario Buyer, retail un seguimiento de la duracin de cada ciclo  menstrual, identificar los [employer] que se puede producir un Media planner y no Best boy sexo durante esos [place]. Mtodo de la ovulacin En este mtodo, la pareja evita tener sexo durante la ovulacin. Mtodo sintotrmico Este mtodo implica no tener sexo durante la ovulacin. Normalmente, la mujer comprueba la ovulacin al observar cambios en su temperatura y en la consistencia del moco cervical. Mtodo posovulacin En este mtodo, la pareja espera a que finalice la ovulacin para Adult nurse. Resumen  La anticoncepcin, o los mtodos anticonceptivos, hace referencia a los mtodos o dispositivos que evitan el [place].  Los mtodos anticonceptivos hormonales incluyen implantes, inyecciones, pastillas, parches, anillos vaginales y anticonceptivos de Freight forwarder.  Los mtodos anticonceptivos de barrera pueden incluir preservativos masculinos, preservativos femeninos, diafragmas, capuchones cervicales, esponjas y espermicidas.  Sears Holdings Corporation tipos de DIU (dispositivo intrauterino). Un DIU puede colocarse en el tero de una mujer para evitar el embarazo durante 3 a 5 aos.  La esterilizacin permanente puede realizarse mediante un procedimiento para hombres, mujeres o ambos.  Los NIKE de Marine scientist natural incluyen no tener SPX Corporation la mujer podra quedar [place]. Esta informacin no tiene Marine scientist el consejo del mdico. Asegrese de hacerle al mdico cualquier pregunta que tenga. Document Revised: 10/07/2017 Document Reviewed: 12/26/2016 Elsevier Patient Education  [place].  Herpes genital Genital Herpes El herpes genital es una infeccin de transmisin sexual (ITS) frecuente causada por  un virus. El virus se propaga de Neomia Dear persona a otra a travs del contacto sexual. La infeccin puede causar picazn, ampollas y llagas en los genitales o en el recto. Los sntomas pueden durar [address] y Clinical biochemist. Esto se llama erupcin. Sin  embargo, el virus NVR Inc cuerpo, de modo que es posible que tenga ms erupciones en el futuro. El Bank of America las erupciones vara: pueden transcurrir meses o aos. El herpes genital afecta a hombres y mujeres. Es particularmente preocupante para las embarazadas porque el virus puede transmitirse al beb durante el parto y causar problemas graves. El herpes genital tambin es un motivo de preocupacin para las personas que tienen debilitado el sistema encargado de combatir las enfermedades (sistema inmunitario). Cules son las causas? La causa de esta afeccin es el virus del herpes simple (VHS) tipo1 o tipo2. El virus puede transmitirse a travs de lo siguiente:  El contacto sexual con una persona infectada, incluido el sexo vaginal, anal y oral.  El contacto con el lquido de una llaga de herpes.  La piel. Esto significa que puede contagiarse el herpes de una pareja infectada incluso si la persona no tiene llagas visibles o no sabe que est infectada. Qu incrementa el riesgo? Es ms probable que desarrolle esta afeccin si:  Tiene relaciones sexuales con muchas parejas.  No [place] preservativos de ltex cuando tiene Clinical research associate. Cules son los signos o los sntomas? La Harley-Davidson de las personas no presentan sntomas (casos asintomticos) o tienen sntomas leves que pueden confundir con otros problemas de la piel. Entre los sntomas se pueden incluir los siguientes:  Pequeos bultos (protuberancias) rojos cerca de los genitales, del recto o de la boca. Estos bultos se convierten en ampollas y Clinical cytogeneticist.  Sntomas similares a los de la gripe, como: ? Grant Ruts. ? Dolores Temple-Inland. ? Ganglios linfticos hinchados. ? Dolor de [place].  Dolor al Beatrix Shipper.  Dolor y picazn en la zona genital o en el rea rectal.  Secrecin vaginal.  Hormigueo o dolor punzante en las piernas y en las nalgas. Por lo general, los sntomas son ms intensos y duran ms durante la  primera erupcin (primaria). Los sntomas similares a los de la gripe tambin son ms frecuentes durante la erupcin primaria. Cmo se diagnostica? El herpes genital se puede diagnosticar en funcin de lo siguiente:  Un examen fsico.  Sus antecedentes mdicos.  Anlisis de [place].  Anlisis de [place] del lquido (cultivo) de Heritage manager. Cmo se trata? No existe ninguna cura para esta afeccin, pero el tratamiento con medicamentos antivirales que se toman por la boca (por va oral) puede lograr lo siguiente:  Camera operator recuperacin y Eastman Kodak sntomas.  Ayudar a reducir Interior and spatial designer del virus a las Chief Strategy Officer.  Limitar las probabilidades de futuras erupciones o reducir su duracin.  Aliviar los sntomas de futuras erupciones. El mdico tambin puede recomendarle medicamentos para Engineer, materials (analgsicos), como aspirina o ibuprofeno. Siga estas instrucciones en su casa: Actividad sexual  No tenga contacto sexual durante erupciones activas.  Practique el sexo seguro. Los preservativos de ltex y los preservativos femeninos pueden ayudar a Multimedia programmer contagio del virus del herpes. Instrucciones generales  Mantenga las zonas afectadas secas y limpias.  Tome los medicamentos de venta libre y los recetados solamente como se lo haya indicado el mdico.  Evite frotar o tocar las ampollas y las llagas. Si toca las ampollas o llagas: ? Lvese bien las manos con  agua y [place]. ? No se toque los ojos despus de ello.  Para aliviar el dolor o la picazn, puede tomar las siguientes medidas segn las indicaciones del mdico: ? Aplique un pao hmedo y fro (compresa fra) en las zonas afectadas de 4 a 6veces por Futures trader. ? Aplique una sustancia que protege la piel y reduce el sangrado (astringente). ? Aplique un gel que ayuda a Engineer, materials cerca de las llagas (gel con lidocana). ? Dese un bao tibio de poca profundidad para limpiar la zona genital (bao de  asiento).  Concurra a todas las visitas de control como se lo haya indicado el mdico. Esto es importante. Cmo se evita?  Use preservativos. Aunque cualquier persona puede contraer herpes genital durante el contacto sexual, incluso con el uso de un preservativo, el preservativo puede brindar cierta proteccin.  Evite tener mltiples parejas sexuales.  Hable con su pareja sexual acerca de los sntomas que cualquiera de [address]. [place], hable con su pareja acerca de cualquier antecedente de ITS que pudiera tener.  Hgase pruebas para detectar ITS antes de Management consultant. Pdale a su pareja que haga lo mismo.  No tenga contacto sexual si tiene sntomas de herpes genital. Comunquese con un mdico si:  Los sntomas no mejoran con los medicamentos.  Los sntomas vuelven a Research officer, trade union.  Aparecen nuevos sntomas.  Tiene fiebre.  Siente dolor abdominal.  Presenta enrojecimiento, hinchazn o dolor en el ojo.  Nota llagas nuevas en otras partes del cuerpo.  Es Nurse, learning disability y presenta sangrado entre perodos [place].  Tuvo herpes y [place] o est pensando en quedar embarazada. Resumen  El herpes genital es una infeccin de transmisin sexual (ITS) frecuente causada por el virus del herpes simple (VHS) tipo1 o tipo2.  Estos virus casi siempre se transmiten a travs del contacto sexual con Neomia Dear persona infectada.  Es ms probable que Conservator, museum/gallery afeccin si tiene relaciones sexuales con muchas parejas o si tuvo relaciones sexuales sin proteccin.  La Harley-Davidson de las personas no presentan sntomas (casos asintomticos) o tienen sntomas leves que pueden confundir con otros problemas de la piel. Los sntomas aparecen a medida que se presentan las erupciones con meses o aos de separacin entre Neomia Dear y [place].  No existe ninguna cura para esta afeccin, pero el tratamiento con medicamentos antivirales por va oral puede [employer], reducir las  probabilidades de Engineering geologist el virus a una pareja, evitar futuras erupciones o disminuir la duracin de futuras erupciones. Esta informacin no tiene Theme park manager el consejo del mdico. Asegrese de hacerle al mdico cualquier pregunta que tenga. Document Revised: 12/09/2016 Document Reviewed: 12/09/2016 Elsevier Patient Education  2020 [employer].

## 2019-11-21 ENCOUNTER — Other Ambulatory Visit: Payer: Self-pay

## 2019-11-21 ENCOUNTER — Encounter (HOSPITAL_COMMUNITY): Payer: Self-pay | Admitting: Emergency Medicine

## 2019-11-21 ENCOUNTER — Emergency Department (HOSPITAL_COMMUNITY)
Admission: EM | Admit: 2019-11-21 | Discharge: 2019-11-22 | Disposition: A | Discharge status: Home / Self Care | Payer: Self-pay | Attending: Emergency Medicine | Admitting: Emergency Medicine

## 2019-11-21 DIAGNOSIS — M545 Low back pain, unspecified: Secondary | ICD-10-CM

## 2019-11-21 DIAGNOSIS — R112 Nausea with vomiting, unspecified: Secondary | ICD-10-CM

## 2019-11-21 DIAGNOSIS — E119 Type 2 diabetes mellitus without complications: Secondary | ICD-10-CM | POA: Insufficient documentation

## 2019-11-21 DIAGNOSIS — Z794 Long term (current) use of insulin: Secondary | ICD-10-CM | POA: Insufficient documentation

## 2019-11-21 DIAGNOSIS — Z79899 Other long term (current) drug therapy: Secondary | ICD-10-CM | POA: Insufficient documentation

## 2019-11-21 DIAGNOSIS — R197 Diarrhea, unspecified: Secondary | ICD-10-CM | POA: Insufficient documentation

## 2019-11-21 DIAGNOSIS — R1013 Epigastric pain: Secondary | ICD-10-CM | POA: Insufficient documentation

## 2019-11-21 LAB — CBC
HCT: 41.3 % (ref 36.0–46.0)
Hemoglobin: 13.5 g/dL (ref 12.0–15.0)
MCH: 29 pg (ref 26.0–34.0)
MCHC: 32.7 g/dL (ref 30.0–36.0)
MCV: 88.8 fL (ref 80.0–100.0)
Platelets: 381 10*3/uL (ref 150–400)
RBC: 4.65 MIL/uL (ref 3.87–5.11)
RDW: 12.6 % (ref 11.5–15.5)
WBC: 9 10*3/uL (ref 4.0–10.5)
nRBC: 0 % (ref 0.0–0.2)

## 2019-11-21 LAB — URINALYSIS, ROUTINE W REFLEX MICROSCOPIC
Bilirubin Urine: NEGATIVE
Glucose, UA: 150 mg/dL — AB
Hgb urine dipstick: NEGATIVE
Ketones, ur: 5 mg/dL — AB
Leukocytes,Ua: NEGATIVE
Nitrite: NEGATIVE
Protein, ur: NEGATIVE mg/dL
Specific Gravity, Urine: 1.025 (ref 1.005–1.030)
pH: 5 (ref 5.0–8.0)

## 2019-11-21 LAB — COMPREHENSIVE METABOLIC PANEL
ALT: 24 U/L (ref 0–44)
AST: 21 U/L (ref 15–41)
Albumin: 3.7 g/dL (ref 3.5–5.0)
Alkaline Phosphatase: 117 U/L (ref 38–126)
Anion gap: 10 (ref 5–15)
BUN: 8 mg/dL (ref 6–20)
CO2: 24 mmol/L (ref 22–32)
Calcium: 9.4 mg/dL (ref 8.9–10.3)
Chloride: 100 mmol/L (ref 98–111)
Creatinine, Ser: 0.56 mg/dL (ref 0.44–1.00)
GFR calc Af Amer: >60 mL/min (ref >60)
GFR calc non Af Amer: >60 mL/min (ref >60)
Glucose, Bld: 241 mg/dL — ABNORMAL HIGH (ref 70–99)
Potassium: 3.7 mmol/L (ref 3.5–5.1)
Sodium: 134 mmol/L — ABNORMAL LOW (ref 135–145)
Total Bilirubin: 0.3 mg/dL (ref 0.3–1.2)
Total Protein: 7.4 g/dL (ref 6.5–8.1)

## 2019-11-21 LAB — LIPASE, BLOOD: Lipase: 23 U/L (ref 11–51)

## 2019-11-21 LAB — I-STAT BETA HCG BLOOD, ED (MC, WL, AP ONLY): I-stat hCG, quantitative: <5 m[IU]/mL (ref <5)

## 2019-11-21 MED ORDER — SODIUM CHLORIDE 0.9% FLUSH: 3.0000 mL | Freq: Once | INTRAVENOUS | Status: DC

## 2019-11-21 NOTE — ED Triage Notes (Signed)
Per ems, pt from home. Pt is Spanish speaking. Pt had IUD removed one week ago and has had mid, lower, sharp abdominal pain since. Pt was eating dinner when the pain was strong with N/V. EMS VSS. Pt had IUD taken out for possible infection with lower back pain as well. Denies any recent vaginal bleeding.

## 2019-11-22 LAB — VIRUS CULTURE

## 2019-11-22 MED ORDER — ACETAMINOPHEN 500 MG PO TABS
1000.0000 mg | ORAL_TABLET | Freq: Once | ORAL | Status: AC
  Administered 2019-11-22: 1000 mg via ORAL
  Filled 2019-11-22: qty 2

## 2019-11-22 MED ORDER — FAMOTIDINE 20 MG PO TABS
40.0000 mg | ORAL_TABLET | Freq: Once | ORAL | Status: AC
  Administered 2019-11-22: 40 mg via ORAL
  Filled 2019-11-22: qty 2

## 2019-11-22 MED ORDER — ONDANSETRON 4 MG PO TBDP: 4.0000 mg | ORAL_TABLET | Freq: Three times a day (TID) | ORAL | 0 refills | Status: DC | PRN

## 2019-11-22 MED ORDER — ONDANSETRON 4 MG PO TBDP
8.0000 mg | ORAL_TABLET | Freq: Once | ORAL | Status: AC
  Administered 2019-11-22: 8 mg via ORAL
  Filled 2019-11-22: qty 2

## 2019-11-22 MED ORDER — ALUM & MAG HYDROXIDE-SIMETH 200-200-20 MG/5ML PO SUSP
30.0000 mL | Freq: Once | ORAL | Status: AC
  Administered 2019-11-22: 30 mL via ORAL
  Filled 2019-11-22: qty 30

## 2019-11-22 NOTE — ED Provider Notes (Signed)
[facility] EMERGENCY DEPARTMENT Provider Note   CSN: [id number] Arrival date & time: 11/21/19  2125     History Chief Complaint  Patient presents with  . Abdominal Pain    Alexandra Aguirre is a 44 y.o. female.  44yo F w/ PMH including IDDM, GERD, asthma who p/w vomiting, diarrhea, and abdominal pain. Last night after she ate shrimp for dinner, she began having abdominal pain followed by vomiting and diarrhea. Currently she reports pain in top and bottom of her abdomen. No fevers or urinary symptoms. She had similar symptoms in the distant past and after evaluation she was told she had ulcers/acid problem in her stomach.   She also reports 3 weeks of midline to left low back pain that also feels like it is in her hip. Pain is worse when she is trying to sleep and with certain movements. No trauma/falls, change in physical activity, numbness, problems walking, or incontinence.  Pt had IUD removed 1 week ago. No recent vaginal bleeding. She was treated for vaginal yeast infection a few weeks ago. Later she returned and was started on valtrex for possible herpes infection.   The history is provided by the patient.  Abdominal Pain      Past Medical History:  Diagnosis Date  . Abnormal Pap smear of cervix 09/24/2013   AGUS  . Asthma   . Diabetes mellitus without complication ([place])   . Environmental allergies   . GERD (gastroesophageal reflux disease)     Patient Active Problem List   Diagnosis Date Noted  . IUD contraception 11/09/2018  . Acute tonsillitis 11/01/2018  . Acute hemorrhagic otitis externa of both ears 11/01/2018  . Non-recurrent acute suppurative otitis media of left ear without spontaneous rupture of tympanic membrane 11/01/2018  . Acute non-recurrent maxillary sinusitis 11/01/2018  . POP-Q stage 2 cystocele 09/20/2018  . Fatty liver 06/14/2018  . Hyperlipidemia 04/04/2018  . Dental caries 06/21/2017  . Vitamin D deficiency 03/01/2017  .  Type 2 diabetes mellitus with complication, without long-term current use of insulin ([place]) 10/12/2016  . Gastroesophageal reflux disease 10/12/2016  . Generalized anxiety disorder 10/12/2016  . Asthma 01/15/2014  . Vertigo 11/25/2013  . History of cervical dysplasia 11/01/2013  . Pedal edema 09/30/2013  . Migraine headache 09/30/2013  . Gastritis and gastroduodenitis 09/30/2013  . Back pain 09/30/2013    Past Surgical History:  Procedure Laterality Date  . CERVICAL BIOPSY  W/ LOOP ELECTRODE EXCISION    . removal of cyst Right 2011     OB History    Gravida  5   Para  5   Term  5   Preterm      AB      Living  5     SAB      TAB      Ectopic      Multiple      Live Births              Family History  Problem Relation Age of Onset  . Cancer Mother        cervical/ovarian  . Diabetes Mother   . Diabetes Maternal Uncle   . Diabetes Brother   . Diabetes Maternal Uncle   . Diabetes Maternal Uncle     Social History   Tobacco Use  . Smoking status: Never Smoker  . Smokeless tobacco: Never Used  Substance Use Topics  . Alcohol use: No  . Drug use: No  Home Medications Prior to Admission medications   Medication Sig Start Date End Date Taking? Authorizing Provider  albuterol (VENTOLIN HFA) 108 (90 Base) MCG/ACT inhaler Inhale 2 puffs into the lungs every 6 (six) hours as needed for wheezing or shortness of breath. 08/28/19   Anders Simmonds, PA-C  atorvastatin (LIPITOR) 20 MG tablet Take 1 tablet (20 mg total) by mouth daily. 08/28/19   Anders Simmonds, PA-C  Blood Glucose Monitoring Suppl (TRUE METRIX AIR GLUCOSE METER) DEVI 1 each by Does not apply route 3 (three) times daily. 11/05/19   Rema Fendt, NP  cetirizine (ZYRTEC) 10 MG tablet Take 1 tablet (10 mg total) by mouth daily. 12/24/18   Hoy Register, MD  dicyclomine (BENTYL) 20 MG tablet Take 1 tablet (20 mg total) by mouth 2 (two) times daily. Patient not taking: Reported on 11/07/2019  10/27/18   Gerhard Munch, MD  fluconazole (DIFLUCAN) 150 MG tablet Take 150 mg by mouth once. 11/06/19   [provider]  fluticasone (FLONASE) 50 MCG/ACT nasal spray Place 2 sprays into both nostrils daily. Patient not taking: Reported on 11/07/2019 12/24/18   Hoy Register, MD  glipiZIDE (GLUCOTROL) 10 MG tablet Take 1 tablet (10 mg total) by mouth 2 (two) times daily before a meal. 08/28/19   McClung, Marzella Schlein, PA-C  glucose blood (TRUE METRIX BLOOD GLUCOSE TEST) test strip 1 each by Other route 3 (three) times daily. 08/02/18   Anders Simmonds, PA-C  ibuprofen (ADVIL) 800 MG tablet Take 1 tablet (800 mg total) by mouth 3 (three) times daily. Patient not taking: Reported on 11/07/2019 08/19/19   Roxy Horseman, PA-C  Insulin Glargine (LANTUS SOLOSTAR) 100 UNIT/ML Solostar Pen Inject 15 Units into the skin daily. 11/14/19   Anders Simmonds, PA-C  Insulin Pen Needle 31G X 5 MM MISC 1 each by Does not apply route at bedtime. 08/28/19   Anders Simmonds, PA-C  metFORMIN (GLUCOPHAGE) 500 MG tablet Take orally 3 tabs (1500 mg) in the morning and 2 tabs (1000 mg) in the evening Patient taking differently: Take 1,000-1,500 mg by mouth See admin instructions. Take 1500 mg in the morning and 1000 mg in the evening 11/05/19   Rema Fendt, NP  methocarbamol (ROBAXIN) 500 MG tablet Take 2 tablets (1,000 mg total) by mouth 3 (three) times daily. 06/14/18   Anders Simmonds, PA-C  mometasone-formoterol (DULERA) 100-5 MCG/ACT AERO Inhale 2 puffs into the lungs 2 (two) times daily. 02/12/19   Hoy Register, MD  naproxen (NAPROSYN) 500 MG tablet Take 1 tablet (500 mg total) by mouth 2 (two) times daily as needed for moderate pain. Patient not taking: Reported on 11/07/2019 11/01/18   Storm Frisk, MD  naproxen (NAPROSYN) 500 MG tablet Take 1 tablet (500 mg total) by mouth 2 (two) times daily with a meal. 11/05/19   Rema Fendt, NP  olopatadine (PATANOL) 0.1 % ophthalmic solution Place 1 drop  into both eyes 2 (two) times daily. Patient not taking: Reported on 11/07/2019 12/24/18   Hoy Register, MD  ondansetron (ZOFRAN ODT) 4 MG disintegrating tablet Take 1 tablet (4 mg total) by mouth every 8 (eight) hours as needed for nausea or vomiting. 11/22/19   Dailey Buccheri, Ambrose Finland, MD  pantoprazole (PROTONIX) 40 MG tablet Take 1 tablet (40 mg total) by mouth daily. 11/14/19   Hoy Register, MD  sitaGLIPtin (JANUVIA) 100 MG tablet Take 1 tablet (100 mg total) by mouth daily. 08/28/19   Anders Simmonds, PA-C  triamcinolone cream (KENALOG) 0.1 % Apply 1 application topically 2 (two) times daily. Patient not taking: Reported on 11/07/2019 10/29/18   Hoy Register, MD  TRUEplus Lancets 28G MISC 1 EACH BY DOES NOT APPLY ROUTE 3 (THREE) TIMES DAILY. 11/05/19   Hoy Register, MD  valACYclovir (VALTREX) 1000 MG tablet Take 1 tablet (1,000 mg total) by mouth 3 (three) times daily. 11/14/19   Anders Simmonds, PA-C    Allergies    Fruit & vegetable daily [nutritional supplements]  Review of Systems   Review of Systems  Gastrointestinal: Positive for abdominal pain.   All other systems reviewed and are negative except that which was mentioned in HPI  Physical Exam Updated Vital Signs BP (!) 101/52 (BP Location: Left Arm)   Pulse 70   Temp (!) 97.5 F (36.4 C) (Oral)   Resp 18   Ht 5' (1.524 m)   Wt 83.5 kg   LMP 10/22/2019   SpO2 100%   BMI 35.94 kg/m   Physical Exam Vitals and nursing note reviewed.  Constitutional:      General: She is not in acute distress.    Appearance: She is well-developed.  HENT:     Head: Normocephalic and atraumatic.  Eyes:     Conjunctiva/sclera: Conjunctivae normal.     Pupils: Pupils are equal, round, and reactive to light.  Cardiovascular:     Rate and Rhythm: Normal rate and regular rhythm.     Heart sounds: Normal heart sounds. No murmur.  Pulmonary:     Effort: Pulmonary effort is normal.     Breath sounds: Normal breath sounds.  Abdominal:       General: Bowel sounds are normal. There is no distension.     Palpations: Abdomen is soft.     Tenderness: There is abdominal tenderness in the epigastric area. There is no guarding or rebound.  Musculoskeletal:     Cervical back: Neck supple.     Comments: Tenderness of left lumbar paraspinal muscles, no skin changes  Skin:    General: Skin is warm and dry.  Neurological:     Mental Status: She is alert and oriented to person, place, and time.     Comments: Fluent speech  Psychiatric:        Judgment: Judgment normal.     ED Results / Procedures / Treatments   Labs (all labs ordered are listed, but only abnormal results are displayed) Labs Reviewed  COMPREHENSIVE METABOLIC PANEL - Abnormal; Notable for the following components:      Result Value   Sodium 134 (*)    Glucose, Bld 241 (*)    All other components within normal limits  URINALYSIS, ROUTINE W REFLEX MICROSCOPIC - Abnormal; Notable for the following components:   APPearance HAZY (*)    Glucose, UA 150 (*)    Ketones, ur 5 (*)    All other components within normal limits  LIPASE, BLOOD  CBC  I-STAT BETA HCG BLOOD, ED ([facility], [facility], [facility] ONLY)    EKG None  Radiology No results found.  Procedures Procedures (including critical care time)  Medications Ordered in ED Medications  ondansetron (ZOFRAN-ODT) disintegrating tablet 8 mg (8 mg Oral Given 11/22/19 0740)  alum & mag hydroxide-simeth (MAALOX/MYLANTA) 200-200-20 MG/5ML suspension 30 mL (30 mLs Oral Given 11/22/19 0740)  famotidine (PEPCID) tablet 40 mg (40 mg Oral Given 11/22/19 0740)  acetaminophen (TYLENOL) tablet 1,000 mg (1,000 mg Oral Given 11/22/19 3536)    ED Course  I have reviewed  the triage vital signs and the nursing notes.  Pertinent labs & imaging results that were available during my care of the patient were reviewed by me and considered in my medical decision making (see chart for details).    MDM Rules/Calculators/A&P                       Given sudden onset of vomiting and diarrhea as well as epigastric pain, suspect self-limited gastroenteritis.  She has no focal lower abdominal tenderness on exam to suggest appendicitis, diverticulitis.  She had recent pelvic exam without evidence of PID and recent STD testing negative.  I highly doubt any complication from routine IUD removal.  She is improved with no nausea on exam after above medications, tolerating liquids.  Discussed supportive measures.  Regarding her back pain, symptoms suggest musculoskeletal etiology and she has no symptoms suggestive of cauda equina or infectious process of the spine.  I recommended switching to Tylenol instead of NSAIDs because of her upset stomach and instructed to follow-up with PCP.  Return precautions reviewed. Final Clinical Impression(s) / ED Diagnoses Final diagnoses:  Nausea vomiting and diarrhea  Acute left-sided low back pain without sciatica    Rx / DC Orders ED Discharge Orders         Ordered    ondansetron (ZOFRAN ODT) 4 MG disintegrating tablet  Every 8 hours PRN     11/22/19 0903           Deyani Hegarty, Ambrose Finland, MD 11/22/19 [phone]

## 2019-11-22 NOTE — ED Notes (Signed)
Pt discharge instructions and prescriptions reviewed with the patient. The patient verbalized understanding of both. Pt discharged. 

## 2019-12-05 ENCOUNTER — Ambulatory Visit: Payer: Self-pay | Admitting: Pharmacist

## 2019-12-18 ENCOUNTER — Other Ambulatory Visit: Payer: Self-pay

## 2019-12-18 ENCOUNTER — Ambulatory Visit: Payer: Self-pay | Attending: Family Medicine

## 2020-01-09 ENCOUNTER — Telehealth: Payer: Self-pay | Admitting: Licensed Clinical Social Worker

## 2020-01-09 NOTE — Telephone Encounter (Signed)
Call placed to patient utilizing [employer] Cristal Ford JK#[id number]) regarding IBH referral. LCSW left message requesting a return call.

## 2020-01-16 ENCOUNTER — Ambulatory Visit: Payer: Self-pay | Attending: Family Medicine | Admitting: Family Medicine

## 2020-01-16 ENCOUNTER — Other Ambulatory Visit: Payer: Self-pay

## 2020-01-16 ENCOUNTER — Other Ambulatory Visit: Payer: Self-pay | Admitting: Family Medicine

## 2020-01-16 DIAGNOSIS — E7800 Pure hypercholesterolemia, unspecified: Secondary | ICD-10-CM

## 2020-01-16 DIAGNOSIS — E118 Type 2 diabetes mellitus with unspecified complications: Secondary | ICD-10-CM

## 2020-01-16 DIAGNOSIS — J4520 Mild intermittent asthma, uncomplicated: Secondary | ICD-10-CM

## 2020-01-16 DIAGNOSIS — B3731 Acute candidiasis of vulva and vagina: Secondary | ICD-10-CM

## 2020-01-16 DIAGNOSIS — Z9114 Patient's other noncompliance with medication regimen: Secondary | ICD-10-CM

## 2020-01-16 DIAGNOSIS — J302 Other seasonal allergic rhinitis: Secondary | ICD-10-CM

## 2020-01-16 DIAGNOSIS — B373 Candidiasis of vulva and vagina: Secondary | ICD-10-CM

## 2020-01-16 DIAGNOSIS — Z634 Disappearance and death of family member: Secondary | ICD-10-CM

## 2020-01-16 MED ORDER — METFORMIN HCL 500 MG PO TABS: ORAL_TABLET | ORAL | 6 refills | Status: DC

## 2020-01-16 MED ORDER — LANTUS SOLOSTAR 100 UNIT/ML ~~LOC~~ SOPN: 10.0000 [IU] | PEN_INJECTOR | Freq: Every day | SUBCUTANEOUS | 6 refills | Status: DC

## 2020-01-16 MED ORDER — SITAGLIPTIN PHOSPHATE 100 MG PO TABS: 100.0000 mg | ORAL_TABLET | Freq: Every day | ORAL | 6 refills | Status: DC

## 2020-01-16 MED ORDER — DULERA 100-5 MCG/ACT IN AERO: 2.0000 | INHALATION_SPRAY | Freq: Two times a day (BID) | RESPIRATORY_TRACT | 6 refills | Status: DC

## 2020-01-16 MED ORDER — CETIRIZINE HCL 10 MG PO TABS: 10.0000 mg | ORAL_TABLET | Freq: Every day | ORAL | 1 refills | Status: DC

## 2020-01-16 MED ORDER — TRUE METRIX BLOOD GLUCOSE TEST VI STRP: 1.0000 | ORAL_STRIP | Freq: Three times a day (TID) | 12 refills | Status: DC

## 2020-01-16 MED ORDER — FLUCONAZOLE 150 MG PO TABS: 150.0000 mg | ORAL_TABLET | Freq: Once | ORAL | 0 refills | Status: AC

## 2020-01-16 MED ORDER — OLOPATADINE HCL 0.1 % OP SOLN: 1.0000 [drp] | Freq: Two times a day (BID) | OPHTHALMIC | 2 refills | Status: DC

## 2020-01-16 MED ORDER — TRUE METRIX AIR GLUCOSE METER DEVI: 1.0000 | Freq: Three times a day (TID) | 0 refills | Status: DC

## 2020-01-16 MED ORDER — ATORVASTATIN CALCIUM 20 MG PO TABS: 20.0000 mg | ORAL_TABLET | Freq: Every day | ORAL | 6 refills | Status: DC

## 2020-01-16 MED ORDER — GLIPIZIDE 10 MG PO TABS: 10.0000 mg | ORAL_TABLET | Freq: Two times a day (BID) | ORAL | 6 refills | Status: DC

## 2020-01-16 NOTE — Progress Notes (Signed)
Having sneezing itchy eyes and runny nose.

## 2020-01-16 NOTE — Progress Notes (Signed)
Virtual Visit via Telephone Note  I connected with Alexandra Aguirre, on 01/16/2020 at 11:14 AM by telephone due to the COVID-19 pandemic and verified that I am speaking with the correct person using two identifiers.   Consent: I discussed the limitations, risks, security and privacy concerns of performing an evaluation and management service by telephone and the availability of in person appointments. I also discussed with the patient that there may be a patient responsible charge related to this service. The patient expressed understanding and agreed to proceed.   Location of Patient: Home  Location of Provider: Clinic   Persons participating in Telemedicine visit: Melynda Krzywicki Farrington-CMA Dr. Alvis Lemmings     History of Present Illness: Alexandra Aguirre is a 44 year old female with history of type 2 diabetes mellitus (A1c 9.6), hyperlipidemia, GERD, gastritis seen for follow-up visit today.  She recently lost her son -20 days ago. Her 25 year old son shot himself in the head and she happened to find him in his room.  She has been sad ever since but tries to pull herself together for her younger son and her husband.  She also informs me her husband had admitted to previous suicidal thoughts.  She denies suicidal ideations or intent.    She has itching in her eyes and throat and post nasal drip as well and would like something for this as her symptoms have been triggering her asthma.  She has not been using her insulin as she feels well and has just been using just her oral pills. She does not check her sugars as she has no Glucometer. Compliant with her statin. Complains of vaginal itching  Past Medical History:  Diagnosis Date  . Abnormal Pap smear of cervix 09/24/2013   AGUS  . Asthma   . Diabetes mellitus without complication (H[facility])   . Environmental allergies   . GERD (gastroesophageal reflux disease)    Allergies  Allergen Reactions  . Fruit & Vegetable Daily  [Nutritional Supplements] Other (See Comments)    "mouth rash" from Avocado, apple, peaches, plums, kiwi    Current Outpatient Medications on File Prior to Visit  Medication Sig Dispense Refill  . albuterol (VENTOLIN HFA) 108 (90 Base) MCG/ACT inhaler Inhale 2 puffs into the lungs every 6 (six) hours as needed for wheezing or shortness of breath. 18 g 0  . atorvastatin (LIPITOR) 20 MG tablet Take 1 tablet (20 mg total) by mouth daily. 30 tablet 2  . Blood Glucose Monitoring Suppl (TRUE METRIX AIR GLUCOSE METER) DEVI 1 each by Does not apply route 3 (three) times daily. 1 each 0  . glipiZIDE (GLUCOTROL) 10 MG tablet Take 1 tablet (10 mg total) by mouth 2 (two) times daily before a meal. 60 tablet 2  . glucose blood (TRUE METRIX BLOOD GLUCOSE TEST) test strip 1 each by Other route 3 (three) times daily. 100 each 12  . Insulin Glargine (LANTUS SOLOSTAR) 100 UNIT/ML Solostar Pen Inject 15 Units into the skin daily. 3 pen 6  . Insulin Pen Needle 31G X 5 MM MISC 1 each by Does not apply route at bedtime. 30 each 5  . metFORMIN (GLUCOPHAGE) 500 MG tablet Take orally 3 tabs (1500 mg) in the morning and 2 tabs (1000 mg) in the evening (Patient taking differently: Take 1,000-1,500 mg by mouth See admin instructions. Take 1500 mg in the morning and 1000 mg in the evening) 150 tablet 2  . methocarbamol (ROBAXIN) 500 MG tablet Take 2 tablets (1,000  mg total) by mouth 3 (three) times daily. 90 tablet 0  . mometasone-formoterol (DULERA) 100-5 MCG/ACT AERO Inhale 2 puffs into the lungs 2 (two) times daily. 13 g 6  . naproxen (NAPROSYN) 500 MG tablet Take 1 tablet (500 mg total) by mouth 2 (two) times daily with a meal. 60 tablet 0  . ondansetron (ZOFRAN ODT) 4 MG disintegrating tablet Take 1 tablet (4 mg total) by mouth every 8 (eight) hours as needed for nausea or vomiting. 6 tablet 0  . pantoprazole (PROTONIX) 40 MG tablet Take 1 tablet (40 mg total) by mouth daily. 30 tablet 2  . sitaGLIPtin (JANUVIA) 100 MG  tablet Take 1 tablet (100 mg total) by mouth daily. 30 tablet 2  . TRUEplus Lancets 28G MISC 1 EACH BY DOES NOT APPLY ROUTE 3 (THREE) TIMES DAILY. 100 each 11  . valACYclovir (VALTREX) 1000 MG tablet Take 1 tablet (1,000 mg total) by mouth 3 (three) times daily. 21 tablet 0  . cetirizine (ZYRTEC) 10 MG tablet Take 1 tablet (10 mg total) by mouth daily. (Patient not taking: Reported on 01/16/2020) 30 tablet 1  . dicyclomine (BENTYL) 20 MG tablet Take 1 tablet (20 mg total) by mouth 2 (two) times daily. (Patient not taking: Reported on 11/07/2019) 20 tablet 0  . fluconazole (DIFLUCAN) 150 MG tablet Take 150 mg by mouth once.    . fluticasone (FLONASE) 50 MCG/ACT nasal spray Place 2 sprays into both nostrils daily. (Patient not taking: Reported on 11/07/2019) 16 g 2  . ibuprofen (ADVIL) 800 MG tablet Take 1 tablet (800 mg total) by mouth 3 (three) times daily. (Patient not taking: Reported on 11/07/2019) 21 tablet 0  . naproxen (NAPROSYN) 500 MG tablet Take 1 tablet (500 mg total) by mouth 2 (two) times daily as needed for moderate pain. (Patient not taking: Reported on 11/07/2019) 60 tablet 0  . olopatadine (PATANOL) 0.1 % ophthalmic solution Place 1 drop into both eyes 2 (two) times daily. (Patient not taking: Reported on 11/07/2019) 5 mL 2  . triamcinolone cream (KENALOG) 0.1 % Apply 1 application topically 2 (two) times daily. (Patient not taking: Reported on 11/07/2019) 30 g 0   No current facility-administered medications on file prior to visit.    Observations/Objective: Awake, alert, oriented x3 Not in acute distress  Lab Results  Component Value Date   HGBA1C 9.6 (A) 11/14/2019    CMP Latest Ref Rng & Units 11/21/2019 11/07/2019 07/31/2019  Glucose 70 - 99 mg/dL 161(W) 960(A) 540(J)  BUN 6 - 20 mg/dL 8 10 11   Creatinine 0.44 - 1.00 mg/dL 8.11 9.14  Sodium 135 - 145 mmol/L 134(L) 134(L) 136  Potassium 3.5 - 5.1 mmol/L 3.7 4.0 3.8  Chloride 98 - 111 mmol/L 100 103 104  CO2 22 - 32  mmol/L 24 23 22   Calcium 8.9 - 10.3 mg/dL 9.4 7.82) 8.9  Total Protein 6.5 - 8.1 g/dL 7.4 - 7.2  Total Bilirubin 0.3 - 1.2 mg/dL 0.3 - 0.4  Alkaline Phos 38 - 126 U/L 117 - 107  AST 15 - 41 U/L 21 - 17  ALT 0 - 44 U/L 24 - 14    Lipid Panel     Component Value Date/Time   CHOL 191 03/09/2018 1147   CHOL 167 03/01/2017 1129   TRIG 190 (H) 03/09/2018 1147   HDL 31 (L) 03/09/2018 1147   HDL 31 (L) 03/01/2017 1129   CHOLHDL 6.2 03/09/2018 1147   VLDL 38 03/09/2018 1147   LDLCALC  122 (H) 03/09/2018 1147   LDLCALC 95 03/01/2017 1129   LABVLDL 41 (H) 03/01/2017 1129    Assessment and Plan: 1. Pure hypercholesterolemia Uncontrolled Lipid panel at next in person visit Low-cholesterol diet - atorvastatin (LIPITOR) 20 MG tablet; Take 1 tablet (20 mg total) by mouth daily.  Dispense: 30 tablet; Refill: 6  2. Type 2 diabetes mellitus with complication, without long-term current use of insulin (HCC) Uncontrolled with A1c of 9.6; goal is less than 7 Likely due to noncompliance with Lantus and she has been advised to be resume this Counseled on Diabetic diet, my plate method, 606 minutes of moderate intensity exercise/week Blood sugar logs with fasting goals of 80-120 mg/dl, random of less than 180 and in the event of sugars less than 60 mg/dl or greater than 400 mg/dl encouraged to notify the clinic. Advised on the need for annual eye exams, annual foot exams, Pneumonia vaccine. - glipiZIDE (GLUCOTROL) 10 MG tablet; Take 1 tablet (10 mg total) by mouth 2 (two) times daily before a meal.  Dispense: 60 tablet; Refill: 6 - metFORMIN (GLUCOPHAGE) 500 MG tablet; Take orally 3 tabs (1500 mg) in the morning and 2 tabs (1000 mg) in the evening  Dispense: 150 tablet; Refill: 6 - sitaGLIPtin (JANUVIA) 100 MG tablet; Take 1 tablet (100 mg total) by mouth daily.  Dispense: 30 tablet; Refill: 6 - insulin glargine (LANTUS SOLOSTAR) 100 UNIT/ML Solostar Pen; Inject 10 Units into the skin daily.  Dispense:  3 pen; Refill: 6 - glucose blood (TRUE METRIX BLOOD GLUCOSE TEST) test strip; 1 each by Other route 3 (three) times daily.  Dispense: 100 each; Refill: 12 - Blood Glucose Monitoring Suppl (TRUE METRIX AIR GLUCOSE METER) DEVI; 1 each by Does not apply route 3 (three) times daily.  Dispense: 1 each; Refill: 0  3. Mild intermittent asthma without complication Symptoms have been triggered by ongoing allergies We will treat allergy and she will continue to Albuterol as needed - mometasone-formoterol (DULERA) 100-5 MCG/ACT AERO; Inhale 2 puffs into the lungs 2 (two) times daily.  Dispense: 13 g; Refill: 6  4. Seasonal allergies Placed on Zyrtec - cetirizine (ZYRTEC) 10 MG tablet; Take 1 tablet (10 mg total) by mouth daily.  Dispense: 30 tablet; Refill: 1 - olopatadine (PATANOL) 0.1 % ophthalmic solution; Place 1 drop into both eyes 2 (two) times daily.  Dispense: 5 mL; Refill: 2  5. Vaginal candidiasis Likely due to uncontrolled diabetes with hyperglycemia optimization of glucose control will be beneficial - fluconazole (DIFLUCAN) 150 MG tablet; Take 1 tablet (150 mg total) by mouth once for 1 dose.  Dispense: 1 tablet; Refill: 0  6. Non compliance w medication regimen Encouraged to be more compliant with Lantus and she promises to do so  7. Bereavement She will benefit from grief counseling.  Referral has been placed to the LCSW for this Her whole family will also need to undergo family counseling   Follow Up Instructions: Return in about 6 weeks (around 02/27/2020) for in person - PCP; [facility] for therapy - next available.    I discussed the assessment and treatment plan with the patient. The patient was provided an opportunity to ask questions and all were answered. The patient agreed with the plan and demonstrated an understanding of the instructions.   The patient was advised to call back or seek an in-person evaluation if the symptoms worsen or if the condition fails to improve as  anticipated.     I provided 24 minutes total of  non-face-to-face time during this encounter including median intraservice time, reviewing previous notes, investigations, ordering medications, medical decision making, coordinating care and patient verbalized understanding at the end of the visit.     Hoy Register, MD, FAAFP. [facility] [place], [place] [phone]   01/16/2020, 11:14 AM

## 2020-01-17 ENCOUNTER — Encounter: Payer: Self-pay | Admitting: Family Medicine

## 2020-01-17 ENCOUNTER — Telehealth: Payer: Self-pay | Admitting: Family Medicine

## 2020-01-17 NOTE — Telephone Encounter (Signed)
Jasmine can you please reach out to this patient for grief counseling?  Her teenage son committed suicide by shooting himself in the head and she is going through a lot of posttraumatic stress along with her family.  Thank you

## 2020-01-27 ENCOUNTER — Telehealth: Payer: Self-pay | Admitting: Licensed Clinical Social Worker

## 2020-01-27 NOTE — Telephone Encounter (Signed)
Call placed to patient utilizing [employer] [phone]) LCSW informed pt of IBH referral from PCP to address grief.   Pt shared that minor son and spouse has received medical attention due to stress from loss. The family are experiencing financial strain as a result.   LCSW provided validation and encouragement. Grief support resources were provided for both pt, spouse, and minor child. Rental/utility assistance programs were also provided.   Per patient request, resources were mailed to residence. Pt was strongly encouraged to contact LCSW with any additional behavioral health and/or resource needs. Pt verbalized understanding and voiced appreciation for follow-up. No additional concerns noted.

## 2020-02-18 ENCOUNTER — Ambulatory Visit: Payer: Self-pay | Admitting: Family Medicine

## 2020-02-26 ENCOUNTER — Other Ambulatory Visit: Payer: Self-pay

## 2020-02-26 ENCOUNTER — Ambulatory Visit: Payer: Self-pay | Attending: Family Medicine

## 2020-02-26 NOTE — Progress Notes (Unsigned)
This encounter was created in error - please disregard.

## 2020-04-08 ENCOUNTER — Ambulatory Visit: Payer: Self-pay | Admitting: Internal Medicine

## 2020-04-08 ENCOUNTER — Ambulatory Visit: Payer: Self-pay | Admitting: Physician Assistant

## 2020-04-10 ENCOUNTER — Emergency Department (HOSPITAL_COMMUNITY)
Admission: EM | Admit: 2020-04-10 | Discharge: 2020-04-11 | Disposition: A | Discharge status: Home / Self Care | Payer: Self-pay | Attending: Emergency Medicine | Admitting: Emergency Medicine

## 2020-04-10 ENCOUNTER — Emergency Department (HOSPITAL_COMMUNITY): Payer: Self-pay

## 2020-04-10 ENCOUNTER — Encounter (HOSPITAL_COMMUNITY): Payer: Self-pay | Admitting: Emergency Medicine

## 2020-04-10 ENCOUNTER — Other Ambulatory Visit: Payer: Self-pay

## 2020-04-10 DIAGNOSIS — E119 Type 2 diabetes mellitus without complications: Secondary | ICD-10-CM | POA: Insufficient documentation

## 2020-04-10 DIAGNOSIS — Y93E8 Activity, other personal hygiene: Secondary | ICD-10-CM | POA: Insufficient documentation

## 2020-04-10 DIAGNOSIS — Y929 Unspecified place or not applicable: Secondary | ICD-10-CM | POA: Insufficient documentation

## 2020-04-10 DIAGNOSIS — J45909 Unspecified asthma, uncomplicated: Secondary | ICD-10-CM | POA: Insufficient documentation

## 2020-04-10 DIAGNOSIS — Z23 Encounter for immunization: Secondary | ICD-10-CM | POA: Insufficient documentation

## 2020-04-10 DIAGNOSIS — Z794 Long term (current) use of insulin: Secondary | ICD-10-CM | POA: Insufficient documentation

## 2020-04-10 DIAGNOSIS — Z7951 Long term (current) use of inhaled steroids: Secondary | ICD-10-CM | POA: Insufficient documentation

## 2020-04-10 DIAGNOSIS — Y999 Unspecified external cause status: Secondary | ICD-10-CM | POA: Insufficient documentation

## 2020-04-10 DIAGNOSIS — S91331A Puncture wound without foreign body, right foot, initial encounter: Secondary | ICD-10-CM | POA: Insufficient documentation

## 2020-04-10 DIAGNOSIS — W450XXA Nail entering through skin, initial encounter: Secondary | ICD-10-CM | POA: Insufficient documentation

## 2020-04-10 NOTE — ED Triage Notes (Signed)
Patient accidentally stepped on a wood board with nails this evening while cutting grass at her yard , presents with right foot pain /no bleeding .

## 2020-04-11 MED ORDER — TETANUS-DIPHTH-ACELL PERTUSSIS 5-2.5-18.5 LF-MCG/0.5 IM SUSP
0.5000 mL | Freq: Once | INTRAMUSCULAR | Status: AC
  Administered 2020-04-11: 0.5 mL via INTRAMUSCULAR
  Filled 2020-04-11: qty 0.5

## 2020-04-11 MED ORDER — CIPROFLOXACIN HCL 500 MG PO TABS
500.0000 mg | ORAL_TABLET | Freq: Once | ORAL | Status: AC
  Administered 2020-04-11: 500 mg via ORAL
  Filled 2020-04-11: qty 1

## 2020-04-11 MED ORDER — CIPROFLOXACIN HCL 500 MG PO TABS: 500.0000 mg | ORAL_TABLET | Freq: Two times a day (BID) | ORAL | 0 refills | Status: AC

## 2020-04-11 MED ORDER — OXYCODONE-ACETAMINOPHEN 5-325 MG PO TABS
1.0000 | ORAL_TABLET | Freq: Once | ORAL | Status: AC
  Administered 2020-04-11: 1 via ORAL
  Filled 2020-04-11: qty 1

## 2020-04-11 MED ORDER — ACETAMINOPHEN 325 MG PO TABS
650.0000 mg | ORAL_TABLET | Freq: Once | ORAL | Status: AC
  Administered 2020-04-11: 650 mg via ORAL
  Filled 2020-04-11: qty 2

## 2020-04-11 NOTE — ED Provider Notes (Signed)
[facility] EMERGENCY DEPARTMENT Provider Note  CSN: [id number] Arrival date & time: 04/10/20 2209  Chief Complaint(s) Stepped on a nail  HPI Valma Rotenberg is a 44 y.o. female   CC: foot pain Context: stepped on rusted nails while cutting grass while wearing tennis shoes Onset/Duration: 8 hrs  Timing: constant Location: right foot Quality: throbbing Severity: moderate Modifying Factors:  Improved by: nothing  Worsened by: walking Associated Signs/Symptoms:  Pertinent (+): bleeding, which has stopped  Pertinent (-): fall, swelling, redness, discharge.  Unsure of tetanus status    HPI  Past Medical History Past Medical History:  Diagnosis Date  . Abnormal Pap smear of cervix 09/24/2013   AGUS  . Asthma   . Diabetes mellitus without complication (HCC)   . Environmental allergies   . GERD (gastroesophageal reflux disease)    Patient Active Problem List   Diagnosis Date Noted  . IUD contraception 11/09/2018  . Acute tonsillitis 11/01/2018  . Acute hemorrhagic otitis externa of both ears 11/01/2018  . Non-recurrent acute suppurative otitis media of left ear without spontaneous rupture of tympanic membrane 11/01/2018  . Acute non-recurrent maxillary sinusitis 11/01/2018  . POP-Q stage 2 cystocele 09/20/2018  . Fatty liver 06/14/2018  . Hyperlipidemia 04/04/2018  . Dental caries 06/21/2017  . Vitamin D deficiency 03/01/2017  . Type 2 diabetes mellitus with complication, without long-term current use of insulin (HCC) 10/12/2016  . Gastroesophageal reflux disease 10/12/2016  . Generalized anxiety disorder 10/12/2016  . Asthma 01/15/2014  . Vertigo 11/25/2013  . History of cervical dysplasia 11/01/2013  . Pedal edema 09/30/2013  . Migraine headache 09/30/2013  . Gastritis and gastroduodenitis 09/30/2013  . Back pain 09/30/2013   Home Medication(s) Prior to Admission medications   Medication Sig Start Date End Date Taking? Authorizing Provider   albuterol (VENTOLIN HFA) 108 (90 Base) MCG/ACT inhaler Inhale 2 puffs into the lungs every 6 (six) hours as needed for wheezing or shortness of breath. 08/28/19   Anders Simmonds, PA-C  atorvastatin (LIPITOR) 20 MG tablet Take 1 tablet (20 mg total) by mouth daily. 01/16/20   Hoy Register, MD  Blood Glucose Monitoring Suppl (TRUE METRIX AIR GLUCOSE METER) DEVI 1 each by Does not apply route 3 (three) times daily. 01/16/20   Hoy Register, MD  cetirizine (ZYRTEC) 10 MG tablet Take 1 tablet (10 mg total) by mouth daily. 01/16/20   Hoy Register, MD  ciprofloxacin (CIPRO) 500 MG tablet Take 1 tablet (500 mg total) by mouth 2 (two) times daily for 7 days. 04/11/20 04/18/20  Nira Conn, MD  dicyclomine (BENTYL) 20 MG tablet Take 1 tablet (20 mg total) by mouth 2 (two) times daily. Patient not taking: Reported on 11/07/2019 10/27/18   Gerhard Munch, MD  fluticasone [facility]) 50 MCG/ACT nasal spray Place 2 sprays into both nostrils daily. Patient not taking: Reported on 11/07/2019 12/24/18   Hoy Register, MD  glipiZIDE (GLUCOTROL) 10 MG tablet Take 1 tablet (10 mg total) by mouth 2 (two) times daily before a meal. 01/16/20   Newlin, Odette Horns, MD  glucose blood (TRUE METRIX BLOOD GLUCOSE TEST) test strip 1 each by Other route 3 (three) times daily. 01/16/20   Hoy Register, MD  ibuprofen (ADVIL) 800 MG tablet Take 1 tablet (800 mg total) by mouth 3 (three) times daily. Patient not taking: Reported on 11/07/2019 08/19/19   Roxy Horseman, PA-C  insulin glargine (LANTUS SOLOSTAR) 100 UNIT/ML Solostar Pen Inject 10 Units into the skin daily. 01/16/20   Newlin,  Enobong, MD  Insulin Pen Needle 31G X 5 MM MISC 1 each by Does not apply route at bedtime. 08/28/19   Anders Simmonds, PA-C  metFORMIN (GLUCOPHAGE) 500 MG tablet Take orally 3 tabs (1500 mg) in the morning and 2 tabs (1000 mg) in the evening 01/16/20   Hoy Register, MD  methocarbamol (ROBAXIN) 500 MG tablet Take 2 tablets (1,000 mg  total) by mouth 3 (three) times daily. 06/14/18   Anders Simmonds, PA-C  mometasone-formoterol (DULERA) 100-5 MCG/ACT AERO Inhale 2 puffs into the lungs 2 (two) times daily. 01/16/20   Hoy Register, MD  naproxen (NAPROSYN) 500 MG tablet Take 1 tablet (500 mg total) by mouth 2 (two) times daily as needed for moderate pain. Patient not taking: Reported on 11/07/2019 11/01/18   Storm Frisk, MD  naproxen (NAPROSYN) 500 MG tablet Take 1 tablet (500 mg total) by mouth 2 (two) times daily with a meal. 11/05/19   Rema Fendt, NP  olopatadine (PATANOL) 0.1 % ophthalmic solution Place 1 drop into both eyes 2 (two) times daily. 01/16/20   Hoy Register, MD  ondansetron (ZOFRAN ODT) 4 MG disintegrating tablet Take 1 tablet (4 mg total) by mouth every 8 (eight) hours as needed for nausea or vomiting. 11/22/19   Little, Ambrose Finland, MD  pantoprazole (PROTONIX) 40 MG tablet Take 1 tablet (40 mg total) by mouth daily. 11/14/19   Hoy Register, MD  sitaGLIPtin (JANUVIA) 100 MG tablet Take 1 tablet (100 mg total) by mouth daily. 01/16/20   Hoy Register, MD  triamcinolone cream (KENALOG) 0.1 % Apply 1 application topically 2 (two) times daily. Patient not taking: Reported on 11/07/2019 10/29/18   Hoy Register, MD  TRUEplus Lancets 28G MISC 1 EACH BY DOES NOT APPLY ROUTE 3 (THREE) TIMES DAILY. 11/05/19   Hoy Register, MD  valACYclovir (VALTREX) 1000 MG tablet Take 1 tablet (1,000 mg total) by mouth 3 (three) times daily. 11/14/19   Anders Simmonds, PA-C                                                                                                                                    Past Surgical History Past Surgical History:  Procedure Laterality Date  . CERVICAL BIOPSY  W/ LOOP ELECTRODE EXCISION    . removal of cyst Right 2011   Family History Family History  Problem Relation Age of Onset  . Cancer Mother        cervical/ovarian  . Diabetes Mother   . Diabetes Maternal Uncle   . Diabetes  Brother   . Diabetes Maternal Uncle   . Diabetes Maternal Uncle     Social History Social History   Tobacco Use  . Smoking status: Never Smoker  . Smokeless tobacco: Never Used  Vaping Use  . Vaping Use: Never used  Substance Use Topics  . Alcohol use: No  . Drug use: No   Allergies  Fruit & vegetable daily [nutritional supplements]  Review of Systems Review of Systems All other systems are reviewed and are negative for acute change except as noted in the HPI  Physical Exam Vital Signs  I have reviewed the triage vital signs BP 112/73 (BP Location: Left Arm)   Pulse 77   Temp 98.5 F (36.9 C) (Oral)   Resp 18   Ht  (1.651 m)   Wt 80 kg   LMP 03/03/2020   SpO2 100%   BMI 29.35 kg/m   Physical Exam Vitals reviewed.  Constitutional:      General: She is not in acute distress.    Appearance: She is well-developed. She is not diaphoretic.  HENT:     Head: Normocephalic and atraumatic.     Right Ear: External ear normal.     Left Ear: External ear normal.     Nose: Nose normal.  Eyes:     General: No scleral icterus.    Conjunctiva/sclera: Conjunctivae normal.  Neck:     Trachea: Phonation normal.  Cardiovascular:     Rate and Rhythm: Normal rate and regular rhythm.  Pulmonary:     Effort: Pulmonary effort is normal. No respiratory distress.     Breath sounds: No stridor.  Abdominal:     General: There is no distension.  Musculoskeletal:        General: Normal range of motion.     Cervical back: Normal range of motion.       Feet:  Neurological:     Mental Status: She is alert and oriented to person, place, and time.  Psychiatric:        Behavior: Behavior normal.     ED Results and Treatments Labs (all labs ordered are listed, but only abnormal results are displayed) Labs Reviewed - No data to display                                                                                                                       EKG  EKG  Interpretation  Date/Time:    Ventricular Rate:    PR Interval:    QRS Duration:   QT Interval:    QTC Calculation:   R Axis:     Text Interpretation:        Radiology DG Foot Complete Right  Result Date: 04/10/2020 CLINICAL DATA:  Stepped on a nail. EXAM: RIGHT FOOT COMPLETE - 3+ VIEW COMPARISON:  March 30, 2017 FINDINGS: There is no evidence of fracture or dislocation. There is no evidence of arthropathy or other focal bone abnormality. Mild to moderate severity soft tissue swelling is seen along the plantar aspect of the mid to distal right foot. No radiopaque soft tissue foreign bodies are identified. IMPRESSION: Mild to moderate severity plantar soft tissue swelling without evidence of radiopaque soft tissue foreign body or acute osseous abnormality. Electronically Signed   By: Aram Candela M.D.   On: 04/10/2020 23:43    Pertinent labs & imaging  results that were available during my care of the patient were reviewed by me and considered in my medical decision making (see chart for details).  Medications Ordered in ED Medications  Tdap (BOOSTRIX) injection 0.5 mL (has no administration in time range)  acetaminophen (TYLENOL) tablet 650 mg (has no administration in time range)  ciprofloxacin (CIPRO) tablet 500 mg (has no administration in time range)  oxyCODONE-acetaminophen (PERCOCET/ROXICET) 5-325 MG per tablet 1 tablet (1 tablet Oral Given 04/11/20 0107)                                                                                                                                    Procedures Procedures  (including critical care time)  Medical Decision Making / ED Course I have reviewed the nursing notes for this encounter and the patient's prior records (if available in EHR or on provided paperwork).   Santana Gosdin was evaluated in Emergency Department on 04/11/2020 for the symptoms described in the history of present illness. She was evaluated in the context of  the global COVID-19 pandemic, which necessitated consideration that the patient might be at risk for infection with the SARS-CoV-2 virus that causes COVID-19. Institutional protocols and algorithms that pertain to the evaluation of patients at risk for COVID-19 are in a state of rapid change based on information released by regulatory bodies including the CDC and federal and state organizations. These policies and algorithms were followed during the patient's care in the ED.  Puncture wound on rusted nail. Tetanus updated Plain film w/o FB or bony injury. Pain meds given. Will treat with Cipro for ppx      Final Clinical Impression(s) / ED Diagnoses Final diagnoses:  Puncture wound of right foot, initial encounter   The patient appears reasonably screened and/or stabilized for discharge and I doubt any other medical condition or other [facility] requiring further screening, evaluation, or treatment in the ED at this time prior to discharge. Safe for discharge with strict return precautions.  Disposition: Discharge  Condition: Good  I have discussed the results, Dx and Tx plan with the patient/family who expressed understanding and agree(s) with the plan. Discharge instructions discussed at length. The patient/family was given strict return precautions who verbalized understanding of the instructions. No further questions at time of discharge.    ED Discharge Orders         Ordered    ciprofloxacin (CIPRO) 500 MG tablet  2 times daily     Discontinue  Reprint     04/11/20 0551            Follow Up: Hoy Register, MD [address] [place] [address] [phone]  Schedule an appointment as soon as possible for a visit  If symptoms do not improve or  worsen      This chart was dictated using voice recognition software.  Despite best efforts to proofread,  errors can occur which  can change the documentation meaning.   Nira Connardama, Elaynah Virginia Eduardo, MD 04/11/20 [phone]

## 2020-04-11 NOTE — ED Notes (Signed)
Discharge instructions discussed with pt. Pt verbalized understanding with no questions at this time. Pt to go home with spouse 

## 2020-04-29 ENCOUNTER — Other Ambulatory Visit: Payer: Self-pay

## 2020-04-29 ENCOUNTER — Encounter: Payer: Self-pay | Admitting: Physician Assistant

## 2020-04-29 ENCOUNTER — Ambulatory Visit: Payer: Self-pay | Attending: Physician Assistant | Admitting: Physician Assistant

## 2020-04-29 VITALS — BP 104/73 | HR 89 | Temp 97.9°F | Wt 182.0 lb

## 2020-04-29 DIAGNOSIS — K219 Gastro-esophageal reflux disease without esophagitis: Secondary | ICD-10-CM

## 2020-04-29 DIAGNOSIS — E118 Type 2 diabetes mellitus with unspecified complications: Secondary | ICD-10-CM

## 2020-04-29 DIAGNOSIS — Z09 Encounter for follow-up examination after completed treatment for conditions other than malignant neoplasm: Secondary | ICD-10-CM

## 2020-04-29 DIAGNOSIS — B379 Candidiasis, unspecified: Secondary | ICD-10-CM

## 2020-04-29 DIAGNOSIS — L237 Allergic contact dermatitis due to plants, except food: Secondary | ICD-10-CM

## 2020-04-29 DIAGNOSIS — S91331D Puncture wound without foreign body, right foot, subsequent encounter: Secondary | ICD-10-CM

## 2020-04-29 DIAGNOSIS — J302 Other seasonal allergic rhinitis: Secondary | ICD-10-CM

## 2020-04-29 LAB — POCT GLYCOSYLATED HEMOGLOBIN (HGB A1C): Hemoglobin A1C: 9.5 % — AB (ref 4.0–5.6)

## 2020-04-29 LAB — GLUCOSE, POCT (MANUAL RESULT ENTRY): POC Glucose: 346 mg/dl — AB (ref 70–99)

## 2020-04-29 MED ORDER — GLIPIZIDE 10 MG PO TABS: 10.0000 mg | ORAL_TABLET | Freq: Two times a day (BID) | ORAL | 6 refills | Status: DC

## 2020-04-29 MED ORDER — FLUCONAZOLE 150 MG PO TABS: 150.0000 mg | ORAL_TABLET | Freq: Once | ORAL | 0 refills | Status: AC

## 2020-04-29 MED ORDER — METFORMIN HCL 500 MG PO TABS: ORAL_TABLET | ORAL | 6 refills | Status: DC

## 2020-04-29 MED ORDER — PANTOPRAZOLE SODIUM 40 MG PO TBEC: 40.0000 mg | DELAYED_RELEASE_TABLET | Freq: Every day | ORAL | 2 refills | Status: DC

## 2020-04-29 MED ORDER — LANTUS SOLOSTAR 100 UNIT/ML ~~LOC~~ SOPN: 10.0000 [IU] | PEN_INJECTOR | Freq: Every day | SUBCUTANEOUS | 3 refills | Status: DC

## 2020-04-29 MED ORDER — CETIRIZINE HCL 10 MG PO TABS: 10.0000 mg | ORAL_TABLET | Freq: Every day | ORAL | 1 refills | Status: DC

## 2020-04-29 NOTE — Progress Notes (Signed)
Alexandra Aguirre, is a 44 y.o. female  BWL:[id number]  GOT:[id number]  DOB - September 24, 1975  Subjective:  Chief Complaint and HPI: Alexandra Aguirre is a 45 y.o. female here today for several issues.  Seen at ED 7/23 for puncture wounds of R foot that are still sore.  Tetanus(Tdap) was updated.  Prescribed cipro bc nails went through her shoes.  ED/Hospital notes reviewed.  Xray impression: IMPRESSION: Mild to moderate severity plantar soft tissue swelling without evidence of radiopaque soft tissue foreign body or acute osseous abnormality.  She has had many teeth pulled and had to take antibiotics, so she is having vaginal itching and thinks she has a yeast infection.  Not taking insulin at all and says she doesn't want too.  She says she has been taking her oral meds except for the last few days.  Mouth has been hurting so she has been eating pasts, tortillas, and or carb rich foods that are soft.  Admits to poor diet.  Refuses dose of insulin in office.    Also, she had poison ivy on both hands when she initially made the appt but this has resolved.     ROS:   Constitutional:  No f/c, No night sweats, No unexplained weight loss. EENT:  No vision changes, No blurry vision, No hearing changes. No mouth, throat, or ear problems.  Respiratory: No cough, No SOB Cardiac: No CP, no palpitations GI:  No abd pain, No N/V/D. GU: No Urinary s/sx Musculoskeletal: No joint pain Neuro: No headache, no dizziness, no motor weakness.  Skin: No rash Endocrine:  some polydipsia. some polyuria.  Psych: Denies SI/HI  No problems updated.  ALLERGIES: Allergies  Allergen Reactions  . Fruit & Vegetable Daily [Nutritional Supplements] Other (See Comments)    "mouth rash" from Avocado, apple, peaches, plums, kiwi    PAST MEDICAL HISTORY: Past Medical History:  Diagnosis Date  . Abnormal Pap smear of cervix 09/24/2013   AGUS  . Asthma   . Diabetes mellitus without complication (HCC)   .  Environmental allergies   . GERD (gastroesophageal reflux disease)     MEDICATIONS AT HOME: Prior to Admission medications   Medication Sig Start Date End Date Taking? Authorizing Provider  atorvastatin (LIPITOR) 20 MG tablet Take 1 tablet (20 mg total) by mouth daily. 01/16/20  Yes Hoy Register, MD  Blood Glucose Monitoring Suppl (TRUE METRIX AIR GLUCOSE METER) DEVI 1 each by Does not apply route 3 (three) times daily. 01/16/20  Yes Hoy Register, MD  glipiZIDE (GLUCOTROL) 10 MG tablet Take 1 tablet (10 mg total) by mouth 2 (two) times daily before a meal. 04/29/20  Yes Blasa Raisch M, PA-C  glucose blood (TRUE METRIX BLOOD GLUCOSE TEST) test strip 1 each by Other route 3 (three) times daily. 01/16/20  Yes Hoy Register, MD  metFORMIN (GLUCOPHAGE) 500 MG tablet Take orally 3 tabs (1500 mg) in the morning and 2 tabs (1000 mg) in the evening 04/29/20  Yes Tarin Navarez M, PA-C  mometasone-formoterol (DULERA) 100-5 MCG/ACT AERO Inhale 2 puffs into the lungs 2 (two) times daily. 01/16/20  Yes Newlin, Odette Horns, MD  olopatadine (PATANOL) 0.1 % ophthalmic solution Place 1 drop into both eyes 2 (two) times daily. 01/16/20  Yes Newlin, Odette Horns, MD  sitaGLIPtin (JANUVIA) 100 MG tablet Take 1 tablet (100 mg total) by mouth daily. 01/16/20  Yes Newlin, Odette Horns, MD  TRUEplus Lancets 28G MISC 1 EACH BY DOES NOT APPLY ROUTE 3 (THREE) TIMES DAILY. 11/05/19  Yes  Hoy Register, MD  valACYclovir (VALTREX) 1000 MG tablet Take 1 tablet (1,000 mg total) by mouth 3 (three) times daily. 11/14/19  Yes Anders Simmonds, PA-C  cetirizine (ZYRTEC) 10 MG tablet Take 1 tablet (10 mg total) by mouth daily. 04/29/20   Anders Simmonds, PA-C  fluconazole (DIFLUCAN) 150 MG tablet Take 1 tablet (150 mg total) by mouth once for 1 dose. Repeat dose 1 week 04/29/20 04/29/20  Anders Simmonds, PA-C  insulin glargine (LANTUS SOLOSTAR) 100 UNIT/ML Solostar Pen Inject 10 Units into the skin at bedtime. 04/29/20   Anders Simmonds,  PA-C  pantoprazole (PROTONIX) 40 MG tablet Take 1 tablet (40 mg total) by mouth daily. 04/29/20   Anders Simmonds, PA-C     Objective:  EXAM:   Vitals:   04/29/20 1115  BP: 104/73  Pulse: 89  Temp: 97.9 F (36.6 C)  TempSrc: Temporal  SpO2: 95%  Weight: 182 lb (82.6 kg)    General appearance : A&OX3. NAD. Non-toxic-appearing HEENT: Atraumatic and Normocephalic.  PERRLA. EOM intact.  Neck: supple, no JVD. No cervical lymphadenopathy. No thyromegaly Chest/Lungs:  Breathing-non-labored, Good air entry bilaterally, breath sounds normal without rales, rhonchi, or wheezing  CVS: S1 S2 regular, no murmurs, gallops, rubs  Extremities: Bilateral Lower Ext shows no edema, both legs are warm to touch with = pulse throughout  R plantar surface of feet with puncture wounds that are closed and healing without sign of infection or ulceration but that are still tender to the touch Neurology:  CN II-XII grossly intact, Non focal.   Psych:  TP linear. J/I poor regarding health. Normal speech. Appropriate eye contact and affect.  Skin:  No Rash/resolved B hands  Data Review Lab Results  Component Value Date   HGBA1C 9.5 (A) 04/29/2020   HGBA1C 9.6 (A) 11/14/2019   HGBA1C 8.9 (A) 10/29/2018     Assessment & Plan   1. Type 2 diabetes mellitus with complication, without long-term current use of insulin (HCC) Uncontrolled-resume meds and lantus.  Check blood sugars at least bid and do televisit with Franky Macho in 3 weeks-I discussed soft foods such as eggs, egg whites, plain greek yogurt that would be good soft options - Glucose (CBG) - HgB A1c - glipiZIDE (GLUCOTROL) 10 MG tablet; Take 1 tablet (10 mg total) by mouth 2 (two) times daily before a meal.  Dispense: 60 tablet; Refill: 6 - metFORMIN (GLUCOPHAGE) 500 MG tablet; Take orally 3 tabs (1500 mg) in the morning and 2 tabs (1000 mg) in the evening  Dispense: 150 tablet; Refill: 6 - Ambulatory referral to Podiatry - insulin glargine (LANTUS  SOLOSTAR) 100 UNIT/ML Solostar Pen; Inject 10 Units into the skin at bedtime.  Dispense: 15 mL; Refill: 3  2. Seasonal allergies - cetirizine (ZYRTEC) 10 MG tablet; Take 1 tablet (10 mg total) by mouth daily.  Dispense: 30 tablet; Refill: 1  3. Gastroesophageal reflux disease - pantoprazole (PROTONIX) 40 MG tablet; Take 1 tablet (40 mg total) by mouth daily.  Dispense: 30 tablet; Refill: 2  4. Poison ivy resolved  5. Yeast infection After antibiotics and due to poor glycemic control - fluconazole (DIFLUCAN) 150 MG tablet; Take 1 tablet (150 mg total) by mouth once for 1 dose. Repeat dose 1 week  Dispense: 2 tablet; Refill: 0  6. Encounter for examination following treatment at hospital [employer] "[employer]" interpreters used and additional time performing visit was required.   7. Puncture wound of right foot, subsequent encounter No sign of infection  but want to ensure proper/complete resolution due to uncontrolled diabetes.  Advised OTC cushioning pads to displace weight.  - Ambulatory referral to Podiatry     Patient have been counseled extensively about nutrition and exercise  Return in about 3 weeks (around 05/20/2020) for [facility] for DM and 3 months with PCP.  The patient was given clear instructions to go to ER or return to medical center if symptoms don't improve, worsen or new problems develop. The patient verbalized understanding. The patient was told to call to get lab results if they haven't heard anything in the next week.     Georgian Co, PA-C [facility] [place], [place] [phone]   04/29/2020, 11:43 AMPatient ID: [name], female   DOB: May 21, 1976, 44 y.o.   MRN: [id number]

## 2020-04-29 NOTE — Patient Instructions (Signed)
Eat soft foods that are low in carbohydrates(eggs, plain greek yogurt, etc) Drink 80-100 ounces water daily

## 2020-04-30 ENCOUNTER — Other Ambulatory Visit: Payer: Self-pay

## 2020-04-30 DIAGNOSIS — N644 Mastodynia: Secondary | ICD-10-CM

## 2020-05-19 NOTE — Progress Notes (Signed)
Virtual Visit via Telephone Note Due to national recommendations of social distancing due to COVID 19, telehealth visit is felt to be most appropriate for this patient at this time. I discussed the limitations, risks, security and privacy concerns of performing an evaluation and management service by telephone and the availability of in person appointments. I also discussed with the patient that there may be a patient responsible charge related to this service. The patient expressed understanding and agreed to proceed.   I connected with Alexandra Aguirre on 05/20/20  at   10:30 AM EDT  EDT by telephoneand verified that I am speaking with the correct person using two identifiers.  Consent I discussed the limitations, risks, security and privacy concerns of performing an evaluation and management service by telephone and the availability of in person appointments. I also discussed with the patient that there may be a patient responsible charge related to this service. The patient expressed understanding and agreed to proceed.  Location of Patient: Private  Residence   Location of Provider: [facility]   Persons participating in Telemedicine visit: Butch Penny, PharmD, CPP Alexandra Aguirre, [place] Pharm[facility] Resident  Fabio Neighbors, PGY1 Pharmacy Resident   S:   PCP: Dr. Alvis Lemmings  PMH: DM, asthma, GERD, migraines, anxiety, HLD  Patient presents for diabetes evaluation, education, and management. Patient was referred and last seen by [name] on 04/29/2020. At that visit, patient reported non-adherence with her insulin and oral DM medications. POCT BG was elevated at 346 and patient refused dose of insulin at clinic visit. [name] discussed medication adherence and continued current regimen.  Today, patient presented in an emotional state for her televist. She recently lost her son a few months ago who was 40 years old. She witnessed his death by suicide.  Additionally, she is worried about the health of several other family members. Reports not checking her sugars and not taking insulin due to fear of needles. Reports taking metformin 1 tab in the mornings and 1 tablet in the evenings and has not been taking sitagliptin. Patient was very emotional on the phone, crying at times due to her son's death. Patient denies suicidal ideation or thoughts.  Family/Social History:  -Fhx: diabetes (brother, mother, maternal uncle) -Tobacco: denies  [employer] affordability: self-pay  Not adherent to medications Current diabetes medications include: glipizide 10 mg BID, Lantus 10 units daily (not taking), metformin 2500 mg daily (500 mg - 3 tabs AM and 2 tabs PM) (taking 1 tab AM and 1 tab PM), sitagliptin 100 mg daily (not taking) Current hyperlipidemia medications include: Atorvastatin 20 mg daily  Patient denies hypoglycemic events.   Patient denies nocturia (nighttime urination).  Patient denies neuropathy (nerve pain)  O:   Lab Results  Component Value Date   HGBA1C 9.5 (A) 04/29/2020   There were no vitals filed for this visit.  Lipid Panel     Component Value Date/Time   CHOL 191 03/09/2018 1147   CHOL 167 03/01/2017 1129   TRIG 190 (H) 03/09/2018 1147   HDL 31 (L) 03/09/2018 1147   HDL 31 (L) 03/01/2017 1129   CHOLHDL 6.2 03/09/2018 1147   VLDL 38 03/09/2018 1147   LDLCALC 122 (H) 03/09/2018 1147   LDLCALC 95 03/01/2017 1129    Home fasting blood sugars: none 2 hour post-meal/random blood sugars: none.   Clinical Atherosclerotic Cardiovascular Disease (ASCVD): No  The 10-year ASCVD risk score Denman George DC Jr., et al., 2013) is: 2.3%  Values used to calculate the score:     Age: 44 years     Sex: Female     Is Non-Hispanic African American: No     Diabetic: Yes     Tobacco smoker: No     Systolic Blood Pressure: 104 mmHg     Is BP treated: No     HDL Cholesterol: 31 mg/dL     Total Cholesterol: 191 mg/dL      A/P: Diabetes currently uncontrolled with A1C 9.5%. Unable to fully assess today how patient manages her diabetes due to her emotional state. Control is suboptimal due to medication non-adherence and emotional distress. Patient is not checking sugars at home and is not taking insulin or sitagliptin. Also has a fear of needles. Due to patient being under a lot of emotional stress, pharmacy contacted Jenel Lucks, LCSW and she will call patient today or tomorrow to provide grief counseling and resources for her family. -Continued current DM regimen -Requested patient bring all medications to follow-up visit -Scheduled clinic visit for Friday, September 10th at 11:00AM   -Consider checking UACR and Lipid panel at follow-up visit -Jenel Lucks, LCSW will call patient for grief counseling  Written patient instructions provided.  Total time in face to face counseling 45 minutes.   Follow up Pharmacist Clinic Visit in 1 week.     Fabio Neighbors, PharmD PGY2 Ambulatory Care Resident [facility]

## 2020-05-20 ENCOUNTER — Ambulatory Visit: Payer: Self-pay | Attending: Family Medicine | Admitting: Pharmacist

## 2020-05-20 ENCOUNTER — Other Ambulatory Visit: Payer: Self-pay

## 2020-05-20 ENCOUNTER — Encounter: Payer: Self-pay | Admitting: Pharmacist

## 2020-05-20 DIAGNOSIS — E118 Type 2 diabetes mellitus with unspecified complications: Secondary | ICD-10-CM

## 2020-05-22 ENCOUNTER — Telehealth: Payer: Self-pay | Admitting: Licensed Clinical Social Worker

## 2020-05-22 NOTE — Telephone Encounter (Signed)
Call placed to patient utilizing [employer] [phone]) Pt agreed to schedule appointment with LCSW to address behavioral health and/or resources. Appt scheduled for 05/29/2020

## 2020-05-28 NOTE — Progress Notes (Unsigned)
A1C = Check Clinic BG Review medications and adherence (timing of meds, etc.)  Ate or drank anything prior to visit today? At home BGs?  Marland Kitchen Highs . Lows  Hyperglycemia sx (nocturia, neuropathy, visual changes, foot exams) Hypoglycemia symptoms (dizziness, shaky, sweating, hungry, confusion) Diet Exercise      S:    PCP: Dr. Alvis Lemmings  Patient arrives ***.  Presents for diabetes evaluation, education, and management Patient was referred and last seen by [name] on 04/29/20. Patient has a history of nonadherence with insulin and oral DM medications. At last telemedicine visit, patient was overcome with emotion over losing her son to suicide and concern for the health of several other family members and was unable to discuss her diabetes. At that time, referred patient to Jenel Lucks for grief counseling.   Today, ***   Patient reports Diabetes was diagnosed in ***.   Family/Social History:  -FHx: diabetes (brother, mother, maternal uncle) -Tobacco: denies  [employer] affordability: self-pay  Medication adherence reported *** .   Current diabetes medications include:  - Glipizide 10 mg BID - Lantus 10 units daily - Metformin 2500 mg daily (1500 mg qAM, 1000 mg qPM) - Sitagliptin 100 mg daily Current hypertension medications include: none Current hyperlipidemia medications include: atorvastatin 20 mg daily  Patient {Actions; denies-reports:120008} hypoglycemic events.  Patient reported dietary habits: Eats *** meals/day Breakfast:*** Lunch:*** Dinner:*** Snacks:*** Drinks:***  Patient-reported exercise habits: ***   Patient {Actions; denies-reports:120008} nocturia (nighttime urination).  Patient {Actions; denies-reports:120008} neuropathy (nerve pain). Patient {Actions; denies-reports:120008} visual changes. Patient {Actions; denies-reports:120008} self foot exams.     O:  POCT BG: ***   Lab Results  Component Value Date   HGBA1C 9.5 (A)  04/29/2020   There were no vitals filed for this visit.  Lipid Panel     Component Value Date/Time   CHOL 191 03/09/2018 1147   CHOL 167 03/01/2017 1129   TRIG 190 (H) 03/09/2018 1147   HDL 31 (L) 03/09/2018 1147   HDL 31 (L) 03/01/2017 1129   CHOLHDL 6.2 03/09/2018 1147   VLDL 38 03/09/2018 1147   LDLCALC 122 (H) 03/09/2018 1147   LDLCALC 95 03/01/2017 1129    Home fasting blood sugars: ***  2 hour post-meal/random blood sugars: ***.   Clinical Atherosclerotic Cardiovascular Disease (ASCVD): {YES/NO:21197} The 10-year ASCVD risk score Denman George DC Jr., et al., 2013) is: 2.3%   Values used to calculate the score:     Age: 15 years     Sex: Female     Is Non-Hispanic African American: No     Diabetic: Yes     Tobacco smoker: No     Systolic Blood Pressure: 104 mmHg     Is BP treated: No     HDL Cholesterol: 31 mg/dL     Total Cholesterol: 191 mg/dL    A/P: Diabetes longstanding*** currently uncontrolled with most recent A1c of 9.5 on 04/29/2020. Patient is *** able to verbalize appropriate hypoglycemia management plan. Medication adherence appears ***. Control is suboptimal due to ***. -{Meds adjust:18428} basal insulin *** (insulin ***). Patient will continue to titrate 1 unit every *** days if fasting blood sugar > 100mg /dl until fasting blood sugars reach goal or next visit.  -{Meds adjust:18428}  rapid insulin *** (insulin ***) to ***.  -{Meds adjust:18428} GLP-1 *** (generic name***) to ***.  -{Meds adjust:18428} SGLT2-I *** (generic name***) to ***. Counseled on sick day rules for ***. -Extensively discussed pathophysiology of diabetes, recommended lifestyle interventions, dietary effects on blood  sugar control -Counseled on s/sx of and management of hypoglycemia -Next A1C anticipated 07/30/2020.   ASCVD risk - primary***secondary prevention in patient with diabetes. Last LDL {Is/is not:9024} controlled. ASCVD risk score {Is/is not:9024} >20%  - {Desc;  low/moderate/high:110033} intensity statin indicated. Aspirin {Is/is not:9024} indicated.  -{Meds adjust:18428} aspirin *** mg  -{Meds adjust:18428} ***statin *** mg.   Hypertension longstanding*** currently ***.  Blood pressure goal = *** mmHg. Medication adherence ***.  Blood pressure control is suboptimal due to ***. -***  Written patient instructions provided.  Total time in face to face counseling *** minutes.   Follow up Pharmacist/PCP*** Clinic Visit in ***.

## 2020-05-29 ENCOUNTER — Ambulatory Visit: Payer: Self-pay | Admitting: Pharmacist

## 2020-05-29 ENCOUNTER — Telehealth: Payer: Self-pay | Admitting: Licensed Clinical Social Worker

## 2020-05-29 ENCOUNTER — Ambulatory Visit: Payer: Self-pay | Attending: Family Medicine | Admitting: Licensed Clinical Social Worker

## 2020-05-29 NOTE — Telephone Encounter (Signed)
Call placed to patient utilizing [employer] [facility] GU#[phone]) regarding scheduled patient. LCSW left message requesting a return call.

## 2020-06-02 ENCOUNTER — Ambulatory Visit: Payer: Self-pay | Admitting: *Deleted

## 2020-06-02 ENCOUNTER — Other Ambulatory Visit: Payer: Self-pay

## 2020-06-02 ENCOUNTER — Ambulatory Visit
Admission: RE | Admit: 2020-06-02 | Discharge: 2020-06-02 | Disposition: A | Discharge status: Home / Self Care | Payer: Self-pay | Source: Ambulatory Visit | Attending: Obstetrics and Gynecology | Admitting: Obstetrics and Gynecology

## 2020-06-02 ENCOUNTER — Ambulatory Visit: Payer: Self-pay

## 2020-06-02 VITALS — BP 112/76 | Temp 98.2°F | Wt 180.3 lb

## 2020-06-02 DIAGNOSIS — N644 Mastodynia: Secondary | ICD-10-CM

## 2020-06-02 DIAGNOSIS — Z01419 Encounter for gynecological examination (general) (routine) without abnormal findings: Secondary | ICD-10-CM

## 2020-06-02 NOTE — Progress Notes (Signed)
Alexandra Aguirre is a 44 y.o. [id number] female who presents to [facility] clinic today with complaint of right breast diffuse pain and left center breast pain that radiates x 5 months. Patient stated the pain comes and goes. Patient rated the pain at a 7 out of 10.    Pap Smear: Pap smear completed today. Last Pap smear was 03/06/2018 at [facility] and normal with negative HPV. Patient previous Pap smear was 11/10/2016 at [facility] and normal with negative HPV. Patients prior Pap smear 09/24/2013 at [facility] was AGUS.Patient had a follow upcolposcopy and endometrial biopsythat were benign. 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 A LEEP was completed 12/29/2003 for follow up of abnormal colposcopy. Last Pap four Pap smear results are in Epic.   Physical exam: Breasts Breasts symmetrical. No skin abnormalities bilateral breasts. No nipple retraction bilateral breasts. No nipple discharge bilateral breasts. No lymphadenopathy. No lumps palpated bilateral breasts. Complaints of right diffuse and left outer breast pain on exam.       Pelvic/Bimanual Ext Genitalia No lesions, no swelling and no discharge observed on external genitalia.        Vagina Vagina pink and normal texture. No lesions or discharge observed in vagina.        Cervix Cervix is present. Cervix pink and reddened around os. Cervix friable. No discharge observed.    Uterus Uterus is present and palpable. Uterus in normal position and normal size.        Adnexae Bilateral ovaries present and palpable. No tenderness on palpation.         Rectovaginal No rectal exam completed today since patient had no rectal complaints. No skin abnormalities observed on exam.     Smoking History: Patient has never smoked.   Patient Navigation: Patient education provided. Access to services provided for  patient through [employer] program. Spanish interpreter Natale Lay from [facility] provided.    Breast and Cervical Cancer Risk Assessment: Patient does not have family history of breast cancer, known genetic mutations, or radiation treatment to the chest before age 70. Patient has a history of cervical dysplasia. Patient has no history of being immunocompromised, or DES exposure in-utero.  Risk Assessment    Risk Scores      06/02/2020 03/06/2018   Last edited by: Meryl Dare, CMA Soundra Lampley, Carlye Grippe, RN   5-year risk: 0.4 % 0.3 %   Lifetime risk: 5 % 4.6 %          A: BCCCP exam with pap smear Complaint of bilateral breast pain  P: Referred patient to the [facility] for a diagnostic mammogram. Appointment scheduled Tuesday, June 02, 2020 at 1240.  [name], RN 06/02/2020 10:49 AM

## 2020-06-02 NOTE — Patient Instructions (Signed)
Explained breast self awareness with [name]. Pap smear completed today. Let patient know that follow-up for today's Pap smear will be based on the result. Referred patient to the [facility] for a diagnostic mammogram. Appointment scheduled Tuesday, June 02, 2020 at 1240. Patient aware of appointment and will be there. Let patient know will follow up with her within the next couple weeks with results of Pap smear by phone. Informed patient that there Breast Center will follow-up with her within the next couple of weeks with results of her mammogram by letter or phone. [name] verbalized understanding.  Gavriella Hearst, Kathaleen Maser, RN 10:48 AM

## 2020-06-03 LAB — CYTOLOGY - PAP
Comment: NEGATIVE
Diagnosis: NEGATIVE
High risk HPV: NEGATIVE

## 2020-06-04 ENCOUNTER — Telehealth: Payer: Self-pay

## 2020-06-04 NOTE — Telephone Encounter (Signed)
Called patient via interpreter Alexandra Aguirre to inform patient of pap results. Pap and HPV was negative. Due to previous AGUS pap in 2015 patient will be due for next pap smear in 3 years. Patient voiced understanding.

## 2020-06-15 ENCOUNTER — Other Ambulatory Visit: Payer: Self-pay

## 2020-06-15 ENCOUNTER — Inpatient Hospital Stay: Payer: Self-pay | Attending: Obstetrics and Gynecology | Admitting: *Deleted

## 2020-06-15 VITALS — BP 98/78 | Ht 62.0 in | Wt 182.4 lb

## 2020-06-15 DIAGNOSIS — Z0000 Encounter for general adult medical examination without abnormal findings: Secondary | ICD-10-CM

## 2020-06-15 NOTE — Progress Notes (Signed)
[facility] initial screening   Interpreter- Natale Lay, [employer]   Clinical Measurement: There were no vitals filed for this visit. Fasting Labs Drawn Today, will review with patient when they result.   Medical History:  Patient states that she has high cholesterol, does not have high blood pressure and she has diabetes.  Medications:  Patient states that she does take medication to lower cholesterol and blood sugar.Patient does not take medication to lower blood pressure. Patient does not take an aspirin a day to help prevent a heart attack or stroke. During the past 7 days patient has taken prescribed medication to lower cholesterol on 1 days. During the past 7 days patient has taken prescribed medication to lower blood sugar on 7 days.   Blood pressure, self measurement: Patient states that she does not measure blood pressure from home. She checks her blood pressure N/A. She shares her readings with a health care provider: N/A.   Nutrition: Patient states that on average she eats 0 cups of fruit and 0 cups of vegetables per day. Patient states that she does not eat fish at least 2 times per week. Patient eats less than half servings of whole grains. Patient drinks less than 36 ounces of beverages with added sugar weekly: yes. Patient is currently watching sodium or salt intake: yes. In the past 7 days patient has had 0 drinks containing alcohol. On average patient drinks 0 drinks containing alcohol per day.      Physical activity:  Patient states that she gets 0 minutes of moderate and 0 minutes of vigorous physical activity each week.  Smoking status:  Patient states that she has has never smoked .   Quality of life:  Over the past 2 weeks patient states that she had little interest or pleasure in doing things: nearly everyday. She has been feeling down, depressed or hopeless:nearly everyday.    Risk reduction and counseling:   Patient has been dealing with the death of her son over the  past 5 months. As a result patient has been dealing with depression. Patient and her family are currently in counseling to help them deal with the loss of her son. As a result of what is going on in the patient's life she has not been eating a well balanced diet. Encouraged patient today the importance of keeping herself healthy so that she can be there to take care of her family.  Spoke with patient about the importance of a healthy diet and gave daily recommendations to patient to try and work in. Also encouraged patient to try and start exercising on days when she is feeling up to it. Explained that daily exercise can help with some of the feelings of depression that she is experiencing.    Navigation:  I will notify patient of lab results.  Patient is aware of 2 more health coaching sessions and a follow up.  Time: 30 minutes

## 2020-06-16 LAB — LIPID PANEL
Chol/HDL Ratio: 4.8 ratio — ABNORMAL HIGH (ref 0.0–4.4)
Cholesterol, Total: 184 mg/dL (ref 100–199)
HDL: 38 mg/dL — ABNORMAL LOW (ref >39)
LDL Chol Calc (NIH): 124 mg/dL — ABNORMAL HIGH (ref 0–99)
Triglycerides: 121 mg/dL (ref 0–149)
VLDL Cholesterol Cal: 22 mg/dL (ref 5–40)

## 2020-06-16 LAB — HEMOGLOBIN A1C
Est. average glucose Bld gHb Est-mCnc: 226 mg/dL
Hgb A1c MFr Bld: 9.5 % — ABNORMAL HIGH (ref 4.8–5.6)

## 2020-06-16 LAB — GLUCOSE, RANDOM: Glucose: 226 mg/dL — ABNORMAL HIGH (ref 65–99)

## 2020-06-22 ENCOUNTER — Telehealth: Payer: Self-pay

## 2020-06-22 NOTE — Telephone Encounter (Signed)
Health coaching 2   interpreter- Natale Lay, [employer]   Labs- 184 cholesterol, 124 LDL cholesterol, 121 triglycerides, 38 HDL cholesterol, 9.5 hemoglobin A1C, 226 mean plasma glucose. Patient understands and is aware of her lab results.   Goals-    1. Reduce the amount of fried and fatty foods consumed. Try to grill, bake, broil or sautee foods instead. 2. Switch to low-fat or reduce fat dairy products. (milk, yogurt and cheeses) 3. Add in more whole grains and heart healthy fish in diet. (whole wheat bread or pasta, whole grain cereals, brown rice or oatmeal) (salmon, tuna, mackerel or sardines) 4. Reduce the amount of sweets and sugars and carbs consumed. 5. Try to start exercising for 20 minutes daily. (walking)  Encouraged patient to discuss with the doctor how she has not been taking her insulin injections or checking her blood sugar since her son has passed away. Stressed the importance of following the advice that her doctor gives her as far as medications and managing her diabetes.    Navigation:  Patient is aware of 1 more health coaching sessions and a follow up. Patient is scheduled for follow-up visit with [facility] on Friday, July 03, 2020 @ 2:50 pm.   Time- 23 minutes

## 2020-07-03 ENCOUNTER — Ambulatory Visit: Payer: Self-pay | Admitting: Nurse Practitioner

## 2020-07-03 ENCOUNTER — Other Ambulatory Visit: Payer: Self-pay

## 2020-07-09 ENCOUNTER — Ambulatory Visit: Payer: Self-pay | Attending: Family Medicine | Admitting: Licensed Clinical Social Worker

## 2020-07-09 ENCOUNTER — Other Ambulatory Visit: Payer: Self-pay

## 2020-07-12 ENCOUNTER — Encounter (HOSPITAL_COMMUNITY): Payer: Self-pay | Admitting: Emergency Medicine

## 2020-07-12 ENCOUNTER — Other Ambulatory Visit: Payer: Self-pay | Admitting: Emergency Medicine

## 2020-07-12 ENCOUNTER — Other Ambulatory Visit: Payer: Self-pay

## 2020-07-12 ENCOUNTER — Emergency Department (HOSPITAL_COMMUNITY)
Admission: EM | Admit: 2020-07-12 | Discharge: 2020-07-12 | Disposition: A | Discharge status: Home / Self Care | Payer: No Typology Code available for payment source | Attending: Emergency Medicine | Admitting: Emergency Medicine

## 2020-07-12 ENCOUNTER — Emergency Department (HOSPITAL_COMMUNITY): Payer: No Typology Code available for payment source

## 2020-07-12 DIAGNOSIS — E119 Type 2 diabetes mellitus without complications: Secondary | ICD-10-CM | POA: Insufficient documentation

## 2020-07-12 DIAGNOSIS — J45909 Unspecified asthma, uncomplicated: Secondary | ICD-10-CM | POA: Insufficient documentation

## 2020-07-12 DIAGNOSIS — Z794 Long term (current) use of insulin: Secondary | ICD-10-CM | POA: Insufficient documentation

## 2020-07-12 DIAGNOSIS — S7012XA Contusion of left thigh, initial encounter: Secondary | ICD-10-CM

## 2020-07-12 DIAGNOSIS — Z79899 Other long term (current) drug therapy: Secondary | ICD-10-CM | POA: Insufficient documentation

## 2020-07-12 DIAGNOSIS — M25512 Pain in left shoulder: Secondary | ICD-10-CM | POA: Insufficient documentation

## 2020-07-12 MED ORDER — CYCLOBENZAPRINE HCL 10 MG PO TABS: 10.0000 mg | ORAL_TABLET | Freq: Two times a day (BID) | ORAL | 0 refills | Status: DC | PRN

## 2020-07-12 MED ORDER — HYDROCODONE-ACETAMINOPHEN 5-325 MG PO TABS
1.0000 | ORAL_TABLET | Freq: Once | ORAL | Status: AC
  Administered 2020-07-12: 1 via ORAL
  Filled 2020-07-12: qty 1
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
## 2020-07-12 NOTE — Discharge Instructions (Addendum)
Please read instructions below. °Apply ice to your areas of pain for 20 minutes at a time. °You can take 600 mg of Advil/ibuprofen every 6 hours as needed for pain. °You can take flexeril every 12 hours as needed for muscle spasm. Be aware this medication can make you drowsy. °Schedule an appointment with your primary care provider to follow up on your visit today. °Return to the ER for severely worsening headache, vision changes, if new numbness or weakness in your arms or legs, inability to urinate, inability to hold your bowels, or concerning symptoms. ° °

## 2020-07-29 ENCOUNTER — Ambulatory Visit: Payer: Self-pay | Admitting: Family Medicine

## 2020-08-26 ENCOUNTER — Other Ambulatory Visit: Payer: Self-pay

## 2020-08-26 ENCOUNTER — Encounter: Payer: Self-pay | Admitting: Physician Assistant

## 2020-08-26 ENCOUNTER — Ambulatory Visit: Payer: Self-pay | Attending: Physician Assistant | Admitting: Physician Assistant

## 2020-08-26 ENCOUNTER — Other Ambulatory Visit: Payer: Self-pay | Admitting: Physician Assistant

## 2020-08-26 VITALS — BP 107/71 | HR 72 | Temp 98.2°F | Resp 18 | Ht 63.0 in | Wt 183.0 lb

## 2020-08-26 DIAGNOSIS — B9689 Other specified bacterial agents as the cause of diseases classified elsewhere: Secondary | ICD-10-CM

## 2020-08-26 DIAGNOSIS — R300 Dysuria: Secondary | ICD-10-CM | POA: Insufficient documentation

## 2020-08-26 DIAGNOSIS — B379 Candidiasis, unspecified: Secondary | ICD-10-CM | POA: Insufficient documentation

## 2020-08-26 DIAGNOSIS — J029 Acute pharyngitis, unspecified: Secondary | ICD-10-CM

## 2020-08-26 DIAGNOSIS — R102 Pelvic and perineal pain: Secondary | ICD-10-CM

## 2020-08-26 DIAGNOSIS — N760 Acute vaginitis: Secondary | ICD-10-CM

## 2020-08-26 LAB — POCT URINALYSIS DIP (CLINITEK)
Bilirubin, UA: NEGATIVE
Blood, UA: NEGATIVE
Glucose, UA: >=1000 mg/dL — AB
Ketones, POC UA: NEGATIVE mg/dL
Leukocytes, UA: NEGATIVE
Nitrite, UA: NEGATIVE
POC PROTEIN,UA: NEGATIVE
Spec Grav, UA: >=1.03 — AB (ref 1.010–1.025)
Urobilinogen, UA: 0.2 E.U./dL
pH, UA: 6 (ref 5.0–8.0)

## 2020-08-26 MED ORDER — AMOXICILLIN-POT CLAVULANATE 875-125 MG PO TABS: 1.0000 | ORAL_TABLET | Freq: Two times a day (BID) | ORAL | 0 refills | Status: DC

## 2020-08-26 MED ORDER — FLUCONAZOLE 150 MG PO TABS: ORAL_TABLET | ORAL | 0 refills | Status: DC

## 2020-08-26 MED FILL — FLUCONAZOLE 150 MG TABLET: 150 | 7 days supply | Qty: 2 | Fill #0

## 2020-08-26 MED FILL — AMOX-CLAV 875-125 MG TABLET: 875-125 | 10 days supply | Qty: 20 | Fill #0

## 2020-08-26 NOTE — Patient Instructions (Addendum)
I have prescribed Augmentin, you will take this twice a day for the next 10 days, this can be upsetting to your stomach, I encourage you to stay well-hydrated, take medication with food.  I prescribed Diflucan to help you with the vaginal itching, you will take 1 tablet today and then take the next tablet after you finish the Augmentin.  I hope that you feel better soon  Roney Jaffe, PA-C Physician Assistant [facility] Medicine [email or web address]   Faringitis Pharyngitis  La faringitis ocurre cuando hay enrojecimiento, dolor e hinchazn (inflamacin) en la garganta (faringe). Es Neomia Dear causa muy comn de dolor de Advertising copywriter. La faringitis puede ser causada por una bacteria, pero por lo general la provoca un virus. La mayora de los casos de faringitis se curan sin tratamiento. Cules son las causas? Esta afeccin puede ser causada por lo siguiente:  Infeccin por virus (viral). La faringitis viral se contagia de [place] persona a otra (es contagiosa) al toser, estornudar y compartir objetos o utensilios personales como tazas, tenedores, cucharas, cepillos de diente.  Infeccin por bacterias (bacteriana). La faringitis bacteriana se puede contagiar al tocarse la nariz o cara luego de entrar en contacto con la bacteria, o a travs de un contacto ms ntimo, como por ejemplo, al besarse.  Alergias. Las alergias pueden causar una acumulacin de mucosidad en la garganta (goteo posnasal) que deriva en la inflamacin e irritacin. A su vez, las alergias pueden bloquear las fosas nasales, lo cual hace que se deba respirar por la boca, y esto seca e Insurance claims handler. Qu incrementa el riesgo? Es ms probable que desarrolle esta afeccin si:  Tiene entre 5 y 65aos.  Est en lugares muy concurridos, tales como guardera, escuela o vivir en una residencia estudiantil.  Vive en un ambiente de clima fro.  Tiene debilitado el sistema que combate las  enfermedades (inmunitario). Cules son los signos o sntomas? Los sntomas de esta condicin varan segn la causa (viral, bacteriana o Programmer, multimedia) y pueden incluir los siguientes:  Dolor de Advertising copywriter.  Fatiga.  Fiebre no muy alta.  Dolor de [place].  Dolores musculares y en las articulaciones.  Erupciones cutneas.  Ganglios hinchados en la garganta (ganglios linfticos).  Pelcula parecida a las placas en la garganta o amgdalas. Por lo general, esto es un sntoma de faringitis bacteriana.  Vmitos.  Nariz tapada (congestin nasal).  Tos.  Ojos rojos con picazn (conjuntivitis).  Prdida del apetito. Cmo se diagnostica? Generalmente, esta afeccin se diagnostica en funcin de los antecedentes mdicos y de un examen fsico. El mdico le har preguntas sobre la enfermedad y sus sntomas. Puede que se haga un cultivo de su garganta para buscar bacterias (prueba rpida para estreptococos estreptococos). Tambin es posible que se realicen otros anlisis de laboratorio, segn la posible causa, aunque esto es poco comn. Cmo se trata? Generalmente, esta afeccin mejora en el trmino de 3 o 4 das sin medicamentos. La faringitis bacteriana puede tratarse con antibiticos. Siga estas indicaciones en su casa:  Tome los medicamentos de venta libre y los recetados solamente como se lo haya indicado el mdico. ? Si le recetaron un antibitico, tmelo como se lo haya indicado el mdico. No deje de tomar los antibiticos aunque comience a Actor. ? No le administre aspirina a los nios por el riesgo de que contraigan el sndrome de [place].  Beba gran cantidad de lquido para mantener la orina de tono claro o color amarillo plido.  Descanse lo suficiente.  Haga grgaras con  una mezcla de agua y sal 3 o 4veces al da, o cuando sea necesario. Para preparar la mezcla de agua con sal, disuelva por completo de media a 1cucharadita de sal en 1taza de agua tibia.  Si su mdico lo  aprueba, puede usar pastillas o [employer] para Science writer. Comunquese con un mdico si:  Tiene bultos grandes y dolorosos en el cuello.  Tiene una erupcin cutnea.  Cuando tose elimina una expectoracin verde, amarillo amarronado o con [place]. Solicite ayuda de inmediato si:  El cuello se pone rgido.  Comienza a babear o no puede tragar lquidos.  No puede beber ni tomar medicamentos sin vomitar.  Siente un dolor intenso que no se va, incluso luego de [employer].  Tiene dificultades para respirar, y no a causa de la congestin nasal.  Experimenta un nuevo dolor e hinchazn de las articulaciones como las rodillas, tobillos, [place] o codos. Resumen  La faringitis ocurre cuando hay enrojecimiento, dolor e hinchazn (inflamacin) en la garganta (faringe).  Si bien la faringitis puede ser causada por una bacteria, la causa ms comn son los virus.  La mayora de los casos de faringitis se curan sin tratamiento.  La faringitis bacteriana se trata con antibiticos. Esta informacin no tiene Theme park manager el consejo del mdico. Asegrese de hacerle al mdico cualquier pregunta que tenga. Document Revised: 01/17/2017 Document Reviewed: 01/17/2017 Elsevier Patient Education  2020 [employer].

## 2020-08-26 NOTE — Progress Notes (Signed)
Established Patient Office Visit  Subjective:  Patient ID: Alexandra Aguirre, female    DOB: [birth year]  Age: 44 y.o. MRN: [id number]  CC: No chief complaint on file.   HPI Alexandra Aguirre reports that she has been having bilateral ear pain with the right ear being more prominent with jaw pain and trouble swallowing for the past 7 days worse in the past 5 days, has tried flonase with some relief, tried a pill from dentist (amoxicillin)and naproxsyn, denies sick contact, fever or cough, has history of ear infections.   States that she has been having vaginal itching for 1 week without discharge, endorses burning when she urinates, denies fever, abdominal pain, back pain, frequency, has not taken anything for relief, does endorses history of yeast infections and states that this feels similar Due to language barrier, an interpreter was present during the history-taking and subsequent discussion (and for part of the physical exam) with this patient.   Past Medical History:  Diagnosis Date  . Abnormal Pap smear of cervix 09/24/2013   AGUS  . Asthma   . Diabetes mellitus without complication (HCC)   . Environmental allergies   . GERD (gastroesophageal reflux disease)     Past Surgical History:  Procedure Laterality Date  . CERVICAL BIOPSY  W/ LOOP ELECTRODE EXCISION    . removal of cyst Right 2011    Family History  Problem Relation Age of Onset  . Cancer Mother        cervical/ovarian  . Diabetes Mother   . Diabetes Maternal Uncle   . Diabetes Brother   . Diabetes Maternal Uncle   . Diabetes Maternal Uncle     Social History   Socioeconomic History  . Marital status: Married    Spouse name: Not on file  . Number of children: Not on file  . Years of education: Not on file  . Highest education level: 3rd grade  Occupational History  . Not on file  Tobacco Use  . Smoking status: Never Smoker  . Smokeless tobacco: Never Used  Vaping Use  . Vaping Use: Never used   Substance and Sexual Activity  . Alcohol use: No  . Drug use: No  . Sexual activity: Yes    Birth control/protection: None  Other Topics Concern  . Not on file  Social History Narrative  . Not on file   Social Determinants of Health   Financial Resource Strain:   . Difficulty of Paying Living Expenses: Not on file  Food Insecurity:   . Worried About Programme researcher, broadcasting/film/videounning Out of Food in the Last Year: Not on file  . Ran Out of Food in the Last Year: Not on file  Transportation Needs: No Transportation Needs  . Lack of Transportation (Medical): No  . Lack of Transportation (Non-Medical): No  Physical Activity:   . Days of Exercise per Week: Not on file  . Minutes of Exercise per Session: Not on file  Stress:   . Feeling of Stress : Not on file  Social Connections:   . Frequency of Communication with Friends and Family: Not on file  . Frequency of Social Gatherings with Friends and Family: Not on file  . Attends Religious Services: Not on file  . Active Member of Clubs or Organizations: Not on file  . Attends BankerClub or Organization Meetings: Not on file  . Marital Status: Not on file  Intimate Partner Violence:   . Fear of Current or Ex-Partner: Not on file  .  Emotionally Abused: Not on file  . Physically Abused: Not on file  . Sexually Abused: Not on file    Outpatient Medications Prior to Visit  Medication Sig Dispense Refill  . atorvastatin (LIPITOR) 20 MG tablet Take 1 tablet (20 mg total) by mouth daily. 30 tablet 6  . Blood Glucose Monitoring Suppl (TRUE METRIX AIR GLUCOSE METER) DEVI 1 each by Does not apply route 3 (three) times daily. (Patient not taking: Reported on 06/15/2020) 1 each 0  . cetirizine (ZYRTEC) 10 MG tablet Take 1 tablet (10 mg total) by mouth daily. 30 tablet 1  . cyclobenzaprine (FLEXERIL) 10 MG tablet Take 1 tablet (10 mg total) by mouth 2 (two) times daily as needed for muscle spasms. 20 tablet 0  . glipiZIDE (GLUCOTROL) 10 MG tablet Take 1 tablet (10 mg total)  by mouth 2 (two) times daily before a meal. 60 tablet 6  . glucose blood (TRUE METRIX BLOOD GLUCOSE TEST) test strip 1 each by Other route 3 (three) times daily. (Patient not taking: Reported on 06/15/2020) 100 each 12  . insulin glargine (LANTUS SOLOSTAR) 100 UNIT/ML Solostar Pen Inject 10 Units into the skin at bedtime. (Patient not taking: Reported on 06/02/2020) 15 mL 3  . metFORMIN (GLUCOPHAGE) 500 MG tablet Take orally 3 tabs (1500 mg) in the morning and 2 tabs (1000 mg) in the evening 150 tablet 6  . mometasone-formoterol (DULERA) 100-5 MCG/ACT AERO Inhale 2 puffs into the lungs 2 (two) times daily. 13 g 6  . olopatadine (PATANOL) 0.1 % ophthalmic solution Place 1 drop into both eyes 2 (two) times daily. (Patient not taking: Reported on 06/15/2020) 5 mL 2  . pantoprazole (PROTONIX) 40 MG tablet Take 1 tablet (40 mg total) by mouth daily. 30 tablet 2  . sitaGLIPtin (JANUVIA) 100 MG tablet Take 1 tablet (100 mg total) by mouth daily. 30 tablet 6  . TRUEplus Lancets 28G MISC 1 EACH BY DOES NOT APPLY ROUTE 3 (THREE) TIMES DAILY. (Patient not taking: Reported on 06/15/2020) 100 each 11  . valACYclovir (VALTREX) 1000 MG tablet Take 1 tablet (1,000 mg total) by mouth 3 (three) times daily. (Patient not taking: Reported on 06/02/2020) 21 tablet 0   No facility-administered medications prior to visit.    Allergies  Allergen Reactions  . Fruit & Vegetable Daily [Nutritional Supplements] Other (See Comments)    "mouth rash" from Avocado, apple, peaches, plums, kiwi    ROS Review of Systems  Constitutional: Negative for fever.  HENT: Positive for ear pain and sore throat. Negative for congestion, ear discharge, sinus pressure and sinus pain.   Eyes: Negative.   Respiratory: Negative for cough.   Cardiovascular: Negative.   Gastrointestinal: Negative.   Endocrine: Negative.   Genitourinary: Negative.   Musculoskeletal: Negative.   Allergic/Immunologic: Negative.   Neurological: Negative.    Hematological: Negative.   Psychiatric/Behavioral: Negative.       Objective:    Physical Exam Vitals and nursing note reviewed.  Constitutional:      General: She is not in acute distress.    Appearance: Normal appearance. She is not ill-appearing.  HENT:     Head: Normocephalic and atraumatic.     Salivary Glands: Right salivary gland is diffusely enlarged and tender. Left salivary gland is diffusely enlarged and tender.     Right Ear: Tympanic membrane, ear canal and external ear normal.     Left Ear: Tympanic membrane, ear canal and external ear normal.     Nose: Nose normal.  Mouth/Throat:     Mouth: Mucous membranes are moist.     Pharynx: Oropharyngeal exudate and posterior oropharyngeal erythema present. No pharyngeal swelling.     Tonsils: Tonsillar exudate present.  Eyes:     Extraocular Movements: Extraocular movements intact.     Conjunctiva/sclera: Conjunctivae normal.     Pupils: Pupils are equal, round, and reactive to light.  Cardiovascular:     Rate and Rhythm: Normal rate and regular rhythm.     Pulses: Normal pulses.     Heart sounds: Normal heart sounds.  Pulmonary:     Effort: Pulmonary effort is normal.     Breath sounds: Normal breath sounds.  Abdominal:     General: Abdomen is flat.     Palpations: Abdomen is soft.  Musculoskeletal:        General: Normal range of motion.     Cervical back: Normal range of motion.  Lymphadenopathy:     Cervical: Cervical adenopathy present.  Skin:    General: Skin is warm and dry.  Neurological:     General: No focal deficit present.     Mental Status: She is alert and oriented to person, place, and time.  Psychiatric:        Mood and Affect: Mood normal.        Behavior: Behavior normal.        Thought Content: Thought content normal.        Judgment: Judgment normal.     BP 107/71 (BP Location: Left Arm, Patient Position: Sitting, Cuff Size: Normal)   Pulse 72   Temp 98.2 F (36.8 C) (Oral)    Resp 18   Ht 5\' 3"  (1.6 m)   Wt 183 lb (83 kg)   SpO2 100%   BMI 32.42 kg/m  Wt Readings from Last 3 Encounters:  08/26/20 183 lb (83 kg)  07/12/20 200 lb 9.9 oz (91 kg)  06/15/20 182 lb 6.4 oz (82.7 kg)     Health Maintenance Due  Topic Date Due  . Hepatitis C Screening  Never done  . OPHTHALMOLOGY EXAM  Never done  . COVID-19 Vaccine (1) Never done  . URINE MICROALBUMIN  10/12/2017  . FOOT EXAM  04/05/2019  . INFLUENZA VACCINE  04/19/2020    There are no preventive care reminders to display for this patient.  Lab Results  Component Value Date   TSH 2.53 09/30/2016   Lab Results  Component Value Date   WBC 9.0 11/21/2019   HGB 13.5 11/21/2019   HCT 41.3 11/21/2019   MCV 88.8 11/21/2019   PLT 381 11/21/2019   Lab Results  Component Value Date   NA 134 (L) 11/21/2019   K 3.7 11/21/2019   CO2 24 11/21/2019   GLUCOSE 226 (H) 06/15/2020   BUN 8 11/21/2019   CREATININE 0.56 11/21/2019   BILITOT 0.3 11/21/2019   ALKPHOS 117 11/21/2019   AST 21 11/21/2019   ALT 24 11/21/2019   PROT 7.4 11/21/2019   ALBUMIN 3.7 11/21/2019   CALCIUM 9.4 11/21/2019   ANIONGAP 10 11/21/2019   Lab Results  Component Value Date   CHOL 184 06/15/2020   Lab Results  Component Value Date   HDL 38 (L) 06/15/2020   Lab Results  Component Value Date   LDLCALC 124 (H) 06/15/2020   Lab Results  Component Value Date   TRIG 121 06/15/2020   Lab Results  Component Value Date   CHOLHDL 4.8 (H) 06/15/2020   Lab Results  Component Value  Date   HGBA1C 9.5 (H) 06/15/2020      Assessment & Plan:   Problem List Items Addressed This Visit    None    Visit Diagnoses    Dysuria    -  Primary   Relevant Orders   POCT URINALYSIS DIP (CLINITEK) (Completed)   Yeast infection       Relevant Medications   fluconazole (DIFLUCAN) 150 MG tablet   Pharyngitis, unspecified etiology       Relevant Medications   amoxicillin-clavulanate (AUGMENTIN) 875-125 MG tablet   Acute vaginitis        Relevant Orders   Cervicovaginal ancillary only      Meds ordered this encounter  Medications  . fluconazole (DIFLUCAN) 150 MG tablet    Sig: Take 1 tab PO today and then take 1 tab PO when you complete augmetin    Dispense:  2 tablet    Refill:  0    Order Specific Question:   Supervising Provider    Answer:   Delford Field, PATRICK E [[id number]]  . amoxicillin-clavulanate (AUGMENTIN) 875-125 MG tablet    Sig: Take 1 tablet by mouth 2 (two) times daily.    Dispense:  20 tablet    Refill:  0    Order Specific Question:   Supervising Provider    Answer:   WRIGHT, PATRICK E [[id number]]   1. Dysuria UA negative for nitrites or leukocytes, positive for glucose - POCT URINALYSIS DIP (CLINITEK)  2. Yeast infection Trial Diflucan, take 1 tablet today, 1 tablet after completing Augmentin for pharyngitis - fluconazole (DIFLUCAN) 150 MG tablet; Take 1 tab PO today and then take 1 tab PO when you complete augmetin  Dispense: 2 tablet; Refill: 0  3. Pharyngitis, unspecified etiology No rapid strep testing available in clinic due to Covid protocol.  Given swollen tonsils with exudate, appropriate to treat with Augmentin.  Patient education given on rest, proper hydration, red flag warnings for prompt reevaluation - amoxicillin-clavulanate (AUGMENTIN) 875-125 MG tablet; Take 1 tablet by mouth 2 (two) times daily.  Dispense: 20 tablet; Refill: 0  4. Acute vaginitis  - Cervicovaginal ancillary only    I have reviewed the patient's medical history (PMH, PSH, Social History, Family History, Medications, and allergies) , and have been updated if relevant. I spent 30 minutes reviewing chart and  face to face time with patient.    Follow-up: Return if symptoms worsen or fail to improve.    Kasandra Knudsen Zavian Slowey, PA-C

## 2020-08-26 NOTE — Progress Notes (Signed)
Patient complains of bilateral ear pain with the right ear being more prominent with jaw pain with trouble swallowing. Patient also complains of vaginal itching for 1 week with no discharge. Patient complains of burning when she urinates. Patient has eaten today and patient has taken medication today.

## 2020-08-27 ENCOUNTER — Other Ambulatory Visit: Payer: Self-pay | Admitting: Physician Assistant

## 2020-08-27 DIAGNOSIS — B9689 Other specified bacterial agents as the cause of diseases classified elsewhere: Secondary | ICD-10-CM | POA: Insufficient documentation

## 2020-08-27 DIAGNOSIS — J029 Acute pharyngitis, unspecified: Secondary | ICD-10-CM | POA: Insufficient documentation

## 2020-08-27 DIAGNOSIS — N760 Acute vaginitis: Secondary | ICD-10-CM | POA: Insufficient documentation

## 2020-08-27 LAB — CERVICOVAGINAL ANCILLARY ONLY
Bacterial Vaginitis (gardnerella): POSITIVE — AB
Candida Glabrata: NEGATIVE
Candida Vaginitis: POSITIVE — AB
Comment: NEGATIVE
Comment: NEGATIVE
Comment: NEGATIVE

## 2020-08-27 MED ORDER — METRONIDAZOLE 500 MG PO TABS: 500.0000 mg | ORAL_TABLET | Freq: Two times a day (BID) | ORAL | 0 refills | Status: AC

## 2020-08-27 NOTE — Addendum Note (Signed)
Addended by: Roney Jaffe on: 08/27/2020 11:17 AM   Modules accepted: Orders

## 2020-08-28 ENCOUNTER — Telehealth: Payer: Self-pay | Admitting: *Deleted

## 2020-08-28 NOTE — Telephone Encounter (Signed)
Medical Assistant used [employer] to contact patient.  Interpreter Name: Janene Harvey #: [id number] Patient is aware of BV and yeast being present and needing to pick up medication from pharmacy to treat BV and finish yeast medication.

## 2020-08-28 NOTE — Telephone Encounter (Signed)
-----   Message from Roney Jaffe, [place] sent at 08/27/2020 11:17 AM EST ----- Her vaginal swab was positive for bacterial vaginitis, and a yeast infection.  She is already being treated for the yeast infection.  She does need to take metronidazole to help resolve her bacterial vaginitis, she will take this twice a day for the next 7 days.  Prescription sent to her pharmacy

## 2020-10-12 ENCOUNTER — Encounter: Payer: Self-pay | Admitting: Family Medicine

## 2020-10-12 ENCOUNTER — Ambulatory Visit: Payer: Self-pay | Attending: Family Medicine | Admitting: Family Medicine

## 2020-10-12 ENCOUNTER — Other Ambulatory Visit: Payer: Self-pay

## 2020-10-12 ENCOUNTER — Other Ambulatory Visit: Payer: Self-pay | Admitting: Family Medicine

## 2020-10-12 VITALS — BP 114/69 | HR 73 | Ht 63.0 in | Wt 182.2 lb

## 2020-10-12 DIAGNOSIS — K219 Gastro-esophageal reflux disease without esophagitis: Secondary | ICD-10-CM

## 2020-10-12 DIAGNOSIS — M545 Low back pain, unspecified: Secondary | ICD-10-CM

## 2020-10-12 DIAGNOSIS — J4520 Mild intermittent asthma, uncomplicated: Secondary | ICD-10-CM

## 2020-10-12 DIAGNOSIS — B3731 Acute candidiasis of vulva and vagina: Secondary | ICD-10-CM

## 2020-10-12 DIAGNOSIS — E7800 Pure hypercholesterolemia, unspecified: Secondary | ICD-10-CM

## 2020-10-12 DIAGNOSIS — Z1159 Encounter for screening for other viral diseases: Secondary | ICD-10-CM

## 2020-10-12 DIAGNOSIS — L659 Nonscarring hair loss, unspecified: Secondary | ICD-10-CM

## 2020-10-12 DIAGNOSIS — B379 Candidiasis, unspecified: Secondary | ICD-10-CM

## 2020-10-12 DIAGNOSIS — B373 Candidiasis of vulva and vagina: Secondary | ICD-10-CM

## 2020-10-12 DIAGNOSIS — R1032 Left lower quadrant pain: Secondary | ICD-10-CM

## 2020-10-12 DIAGNOSIS — G8929 Other chronic pain: Secondary | ICD-10-CM

## 2020-10-12 DIAGNOSIS — E118 Type 2 diabetes mellitus with unspecified complications: Secondary | ICD-10-CM

## 2020-10-12 LAB — GLUCOSE, POCT (MANUAL RESULT ENTRY): POC Glucose: 236 mg/dl — AB (ref 70–99)

## 2020-10-12 LAB — POCT GLYCOSYLATED HEMOGLOBIN (HGB A1C): HbA1c, POC (controlled diabetic range): 9.4 % — AB (ref 0.0–7.0)

## 2020-10-12 MED ORDER — DULERA 100-5 MCG/ACT IN AERO: 2.0000 | INHALATION_SPRAY | Freq: Two times a day (BID) | RESPIRATORY_TRACT | 6 refills | Status: DC

## 2020-10-12 MED ORDER — LANTUS SOLOSTAR 100 UNIT/ML ~~LOC~~ SOPN: 10.0000 [IU] | PEN_INJECTOR | Freq: Every day | SUBCUTANEOUS | 6 refills | Status: DC

## 2020-10-12 MED ORDER — PANTOPRAZOLE SODIUM 40 MG PO TBEC: 40.0000 mg | DELAYED_RELEASE_TABLET | Freq: Every day | ORAL | 2 refills | Status: DC

## 2020-10-12 MED ORDER — ATORVASTATIN CALCIUM 20 MG PO TABS: 20.0000 mg | ORAL_TABLET | Freq: Every day | ORAL | 6 refills | Status: DC

## 2020-10-12 MED ORDER — GLIPIZIDE 10 MG PO TABS: 10.0000 mg | ORAL_TABLET | Freq: Two times a day (BID) | ORAL | 6 refills | Status: DC

## 2020-10-12 MED ORDER — CYCLOBENZAPRINE HCL 10 MG PO TABS: 10.0000 mg | ORAL_TABLET | Freq: Two times a day (BID) | ORAL | 0 refills | Status: DC | PRN

## 2020-10-12 MED ORDER — METFORMIN HCL 500 MG PO TABS: ORAL_TABLET | ORAL | 6 refills | Status: DC

## 2020-10-12 MED ORDER — FLUCONAZOLE 150 MG PO TABS: 150.0000 mg | ORAL_TABLET | Freq: Once | ORAL | 0 refills | Status: AC

## 2020-10-12 MED ORDER — NAPROXEN 500 MG PO TABS: 500.0000 mg | ORAL_TABLET | Freq: Two times a day (BID) | ORAL | 0 refills | Status: DC

## 2020-10-12 MED ORDER — SITAGLIPTIN PHOSPHATE 100 MG PO TABS: 100.0000 mg | ORAL_TABLET | Freq: Every day | ORAL | 6 refills | Status: DC

## 2020-10-12 NOTE — Progress Notes (Signed)
Subjective:  Patient ID: Alexandra Aguirre, female    DOB: 1975-12-11  Age: 45 y.o. MRN: [id number]  CC: Abdominal Pain and Dizziness   HPI Alexandra Aguirre is a 45 year old female with history of type 2 diabetes mellitus (A1c 9.4), hyperlipidemia, GERD, gastritis, asthmaseen for follow-up visit today.  She has had lower abdominal pain in her LLQ and dizziness x3 weeks. She has no urinary symptoms, nausea or vomiting, diarrhea constipation. Complains of vaginal itching but no discharge. She had an episode of dizziness and blurry vision. She had no sense of vertigo. This happened once about 3 weeks ago. Also has lower back pain which does not  Radiate. No numbness in extremities. Symptoms have been present for a month.  She has not been compliant with Lantus has just been taking her oral diabetic medications. Asthma is been stable and she is requesting a refill of her inhalers. Also requests a PPI for GERD. She has noticed thinning of her hair for the last couple of months as well.  Past Medical History:  Diagnosis Date  . Abnormal Pap smear of cervix 09/24/2013   AGUS  . Asthma   . Diabetes mellitus without complication (HCC)   . Environmental allergies   . GERD (gastroesophageal reflux disease)     Past Surgical History:  Procedure Laterality Date  . CERVICAL BIOPSY  W/ LOOP ELECTRODE EXCISION    . removal of cyst Right 2011    Family History  Problem Relation Age of Onset  . Cancer Mother        cervical/ovarian  . Diabetes Mother   . Diabetes Maternal Uncle   . Diabetes Brother   . Diabetes Maternal Uncle   . Diabetes Maternal Uncle     Allergies  Allergen Reactions  . Fruit & Vegetable Daily [Nutritional Supplements] Other (See Comments)    "mouth rash" from Avocado, apple, peaches, plums, kiwi    Outpatient Medications Prior to Visit  Medication Sig Dispense Refill  . cetirizine (ZYRTEC) 10 MG tablet Take 1 tablet (10 mg total) by mouth daily. 30 tablet 1   . amoxicillin-clavulanate (AUGMENTIN) 875-125 MG tablet Take 1 tablet by mouth 2 (two) times daily. 20 tablet 0  . atorvastatin (LIPITOR) 20 MG tablet Take 1 tablet (20 mg total) by mouth daily. 30 tablet 6  . cyclobenzaprine (FLEXERIL) 10 MG tablet Take 1 tablet (10 mg total) by mouth 2 (two) times daily as needed for muscle spasms. 20 tablet 0  . fluconazole (DIFLUCAN) 150 MG tablet Take 1 tab PO today and then take 1 tab PO when you complete augmetin 2 tablet 0  . glipiZIDE (GLUCOTROL) 10 MG tablet Take 1 tablet (10 mg total) by mouth 2 (two) times daily before a meal. 60 tablet 6  . metFORMIN (GLUCOPHAGE) 500 MG tablet Take orally 3 tabs (1500 mg) in the morning and 2 tabs (1000 mg) in the evening 150 tablet 6  . mometasone-formoterol (DULERA) 100-5 MCG/ACT AERO Inhale 2 puffs into the lungs 2 (two) times daily. 13 g 6  . naproxen (NAPROSYN) 500 MG tablet TAKE 1 TABLET (500 MG TOTAL) BY MOUTH 2 (TWO) TIMES DAILY WITH A MEAL. 60 tablet 0  . pantoprazole (PROTONIX) 40 MG tablet Take 1 tablet (40 mg total) by mouth daily. 30 tablet 2  . sitaGLIPtin (JANUVIA) 100 MG tablet Take 1 tablet (100 mg total) by mouth daily. 30 tablet 6  . Blood Glucose Monitoring Suppl (TRUE METRIX AIR GLUCOSE METER) DEVI 1 each by  Does not apply route 3 (three) times daily. (Patient not taking: No sig reported) 1 each 0  . glucose blood (TRUE METRIX BLOOD GLUCOSE TEST) test strip 1 each by Other route 3 (three) times daily. (Patient not taking: No sig reported) 100 each 12  . olopatadine (PATANOL) 0.1 % ophthalmic solution Place 1 drop into both eyes 2 (two) times daily. (Patient not taking: No sig reported) 5 mL 2  . TRUEplus Lancets 28G MISC 1 EACH BY DOES NOT APPLY ROUTE 3 (THREE) TIMES DAILY. (Patient not taking: No sig reported) 100 each 11  . insulin glargine (LANTUS SOLOSTAR) 100 UNIT/ML Solostar Pen Inject 10 Units into the skin at bedtime. (Patient not taking: No sig reported) 15 mL 3  . valACYclovir (VALTREX)  1000 MG tablet Take 1 tablet (1,000 mg total) by mouth 3 (three) times daily. (Patient not taking: No sig reported) 21 tablet 0   No facility-administered medications prior to visit.     ROS Review of Systems  Constitutional: Negative for activity change, appetite change and fatigue.  HENT: Negative for congestion, sinus pressure and sore throat.   Eyes: Negative for visual disturbance.  Respiratory: Negative for cough, chest tightness, shortness of breath and wheezing.   Cardiovascular: Negative for chest pain and palpitations.  Gastrointestinal: Negative for abdominal distention, abdominal pain and constipation.  Endocrine: Negative for polydipsia.  Genitourinary: Negative for dysuria and frequency.  Musculoskeletal: Negative for arthralgias and back pain.  Skin: Negative for rash.  Neurological: Positive for light-headedness. Negative for tremors and numbness.  Hematological: Does not bruise/bleed easily.  Psychiatric/Behavioral: Negative for agitation and behavioral problems.    Objective:  BP 114/69   Pulse 73   Ht 5\' 3"  (1.6 m)   Wt 182 lb 3.2 oz (82.6 kg)   SpO2 99%   BMI 32.28 kg/m   BP/Weight 10/12/2020 08/26/2020 07/12/2020  Systolic BP 114 107 121  Diastolic BP 69 71 76  Wt. (Lbs) 182.2 183 200.62  BMI 32.28 32.42 39.18      Physical Exam Constitutional:      Appearance: She is well-developed.  Neck:     Vascular: No JVD.  Cardiovascular:     Rate and Rhythm: Normal rate.     Heart sounds: Normal heart sounds. No murmur heard.   Pulmonary:     Effort: Pulmonary effort is normal.     Breath sounds: Normal breath sounds. No wheezing or rales.  Chest:     Chest wall: No tenderness.  Abdominal:     General: Bowel sounds are normal. There is no distension.     Palpations: Abdomen is soft. There is no mass.     Tenderness: There is no abdominal tenderness.  Musculoskeletal:        General: Normal range of motion.     Right lower leg: No edema.      Left lower leg: No edema.     Comments: Slight tenderness on palpation of bilateral thoracolumbar region. Negative straight leg raise bilaterally  Neurological:     Mental Status: She is alert and oriented to person, place, and time.  Psychiatric:        Mood and Affect: Mood normal.     CMP Latest Ref Rng & Units 06/15/2020 11/21/2019 11/07/2019  Glucose 65 - 99 mg/dL 161(W226(H) 960(A241(H) 540(J252(H)  BUN 6 - 20 mg/dL - 8 10  Creatinine 8.110.44 - 1.00 mg/dL - 9.140.56 7.820.53  Sodium 956135 - 145 mmol/L - 134(L) 134(L)  Potassium 3.5 -  5.1 mmol/L - 3.7 4.0  Chloride 98 - 111 mmol/L - 100 103  CO2 22 - 32 mmol/L - 24 23  Calcium 8.9 - 10.3 mg/dL - 9.4 3.7(S)  Total Protein 6.5 - 8.1 g/dL - 7.4 -  Total Bilirubin 0.3 - 1.2 mg/dL - 0.3 -  Alkaline Phos 38 - 126 U/L - 117 -  AST 15 - 41 U/L - 21 -  ALT 0 - 44 U/L - 24 -    Lipid Panel     Component Value Date/Time   CHOL 184 06/15/2020 1058   TRIG 121 06/15/2020 1058   HDL 38 (L) 06/15/2020 1058   CHOLHDL 4.8 (H) 06/15/2020 1058   CHOLHDL 6.2 03/09/2018 1147   VLDL 38 03/09/2018 1147   LDLCALC 124 (H) 06/15/2020 1058    CBC    Component Value Date/Time   WBC 9.0 11/21/2019 2149   RBC 4.65 11/21/2019 2149   HGB 13.5 11/21/2019 2149   HGB 13.1 03/01/2017 1129   HCT 41.3 11/21/2019 2149   HCT 41.4 03/01/2017 1129   PLT 381 11/21/2019 2149   PLT 372 03/01/2017 1129   MCV 88.8 11/21/2019 2149   MCV 89 03/01/2017 1129   MCH 29.0 11/21/2019 2149   MCHC 32.7 11/21/2019 2149   RDW 12.6 11/21/2019 2149   RDW 13.5 03/01/2017 1129   LYMPHSABS 3.0 07/31/2019 2107   LYMPHSABS 1.8 03/01/2017 1129   MONOABS 0.7 07/31/2019 2107   EOSABS 0.3 07/31/2019 2107   EOSABS 0.3 03/01/2017 1129   BASOSABS 0.1 07/31/2019 2107   BASOSABS 0.0 03/01/2017 1129    Lab Results  Component Value Date   HGBA1C 9.4 (A) 10/12/2020    Assessment & Plan:  1.  Vaginal candidiasis Likely due to hyperglycemia - fluconazole (DIFLUCAN) 150 MG tablet; Take 1 tablet (150 mg  total) by mouth once for 1 dose. Take 1 tab PO today and then take 1 tab PO when you complete augmetin  Dispense: 2 tablet; Refill: 0  2. Gastroesophageal reflux disease Stable - pantoprazole (PROTONIX) 40 MG tablet; Take 1 tablet (40 mg total) by mouth daily.  Dispense: 30 tablet; Refill: 2  3. Type 2 diabetes mellitus with complication, without long-term current use of insulin (HCC) Uncontrolled with A1c of 9.4; goal is less than 7.0 Advised to resume Lantus Counseled on Diabetic diet, my plate method, 283 minutes of moderate intensity exercise/week Blood sugar logs with fasting goals of 80-120 mg/dl, random of less than 151 and in the event of sugars less than 60 mg/dl or greater than 761 mg/dl encouraged to notify the clinic. Advised on the need for annual eye exams, annual foot exams, Pneumonia vaccine. - Microalbumin / creatinine urine ratio; Future - Lipid panel; Future - sitaGLIPtin (JANUVIA) 100 MG tablet; Take 1 tablet (100 mg total) by mouth daily.  Dispense: 30 tablet; Refill: 6 - metFORMIN (GLUCOPHAGE) 500 MG tablet; Take orally 3 tabs (1500 mg) in the morning and 2 tabs (1000 mg) in the evening  Dispense: 150 tablet; Refill: 6 - insulin glargine (LANTUS SOLOSTAR) 100 UNIT/ML Solostar Pen; Inject 10 Units into the skin at bedtime.  Dispense: 15 mL; Refill: 6 - glipiZIDE (GLUCOTROL) 10 MG tablet; Take 1 tablet (10 mg total) by mouth 2 (two) times daily before a meal.  Dispense: 60 tablet; Refill: 6 - POCT glucose (manual entry) - POCT glycosylated hemoglobin (Hb A1C)  4. Mild intermittent asthma without complication Stable with no exacerbations - mometasone-formoterol (DULERA) 100-5 MCG/ACT AERO; Inhale 2  puffs into the lungs 2 (two) times daily.  Dispense: 13 g; Refill: 6  5. Hair loss She recently underwent a stressful event in the spring of last year when her son committed suicide.  This could explain her hair loss but I will proceed with evaluating for underlying thyroid  condition. - T4, free - TSH  6. Pure hypercholesterolemia We will check thyroid panel and adjust regimen accordingly - atorvastatin (LIPITOR) 20 MG tablet; Take 1 tablet (20 mg total) by mouth daily.  Dispense: 30 tablet; Refill: 6  7. Chronic bilateral low back pain without sciatica Likely musculoskeletal Advised to apply heat - naproxen (NAPROSYN) 500 MG tablet; Take 1 tablet (500 mg total) by mouth 2 (two) times daily with a meal.  Dispense: 60 tablet; Refill: 0 - cyclobenzaprine (FLEXERIL) 10 MG tablet; Take 1 tablet (10 mg total) by mouth 2 (two) times daily as needed for muscle spasms.  Dispense: 60 tablet; Refill: 0  8. Left lower quadrant abdominal pain Symptoms occurred about 3 weeks ago We will treat for vaginal candidiasis and if symptoms recur we will proceed with additional work-up  9. Need for hepatitis C screening test - HCV RNA quant rflx ultra or genotyp([facility]); Future    Meds ordered this encounter  Medications  . fluconazole (DIFLUCAN) 150 MG tablet    Sig: Take 1 tablet (150 mg total) by mouth once for 1 dose. Take 1 tab PO today and then take 1 tab PO when you complete augmetin    Dispense:  2 tablet    Refill:  0  . naproxen (NAPROSYN) 500 MG tablet    Sig: Take 1 tablet (500 mg total) by mouth 2 (two) times daily with a meal.    Dispense:  60 tablet    Refill:  0  . pantoprazole (PROTONIX) 40 MG tablet    Sig: Take 1 tablet (40 mg total) by mouth daily.    Dispense:  30 tablet    Refill:  2  . cyclobenzaprine (FLEXERIL) 10 MG tablet    Sig: Take 1 tablet (10 mg total) by mouth 2 (two) times daily as needed for muscle spasms.    Dispense:  60 tablet    Refill:  0  . sitaGLIPtin (JANUVIA) 100 MG tablet    Sig: Take 1 tablet (100 mg total) by mouth daily.    Dispense:  30 tablet    Refill:  6  . mometasone-formoterol (DULERA) 100-5 MCG/ACT AERO    Sig: Inhale 2 puffs into the lungs 2 (two) times daily.    Dispense:  13 g    Refill:  6  .  metFORMIN (GLUCOPHAGE) 500 MG tablet    Sig: Take orally 3 tabs (1500 mg) in the morning and 2 tabs (1000 mg) in the evening    Dispense:  150 tablet    Refill:  6    Discontinue previous dose  . insulin glargine (LANTUS SOLOSTAR) 100 UNIT/ML Solostar Pen    Sig: Inject 10 Units into the skin at bedtime.    Dispense:  15 mL    Refill:  6  . glipiZIDE (GLUCOTROL) 10 MG tablet    Sig: Take 1 tablet (10 mg total) by mouth 2 (two) times daily before a meal.    Dispense:  60 tablet    Refill:  6    Decrease previous dose  . atorvastatin (LIPITOR) 20 MG tablet    Sig: Take 1 tablet (20 mg total) by mouth daily.  Dispense:  30 tablet    Refill:  6   62 minutes of total face to face time spent on encounter reviewing chart, evaluating the patient, discussing diagnosis, investigations, treatment plan, counseling, coordination of care.  Follow-up: Return in about 3 months (around 01/10/2021) for chronic disease management.       Hoy Register, MD, FAAFP. [facility] [place], [place] [phone]   10/12/2020, 10:11 AM

## 2020-10-12 NOTE — Patient Instructions (Signed)
Dolor de espalda crnico Chronic Back Pain Cuando el dolor en la espalda dura ms de 3 meses, se denomina dolor de espalda crnico. El dolor puede empeorar en ciertos momentos (exacerbaciones). Hay cosas que puede hacer en su casa para Runner, broadcasting/film/video. Siga estas instrucciones en su casa: Est atento a cualquier cambio en los sntomas. Tome estas medidas para Acupuncturist dolor: Control del dolor y de la rigidez  Si se lo indican, aplique hielo sobre la zona dolorida. El mdico puede indicarle que use hielo durante las 24 a 48 horas luego del comienzo de Investment banker, corporate. Para hacer esto: ? Ponga el hielo en una bolsa plstica. ? Coloque una [employer] piel y Copy. ? Coloque el hielo durante , 2 a 3veces por da.  Si se lo indican, aplique calor sobre la zona dolorida. Hgalo con la frecuencia que le haya indicado el mdico. Use la fuente de calor que el mdico le recomiende, como una compresa de calor hmedo o una almohadilla trmica. ? Coloque una [employer] piel y la fuente de Airline pilot. ? Aplique calor durante 20 a . ? Retire la fuente de calor si la piel se le pone de color rojo brillante. Esto es especialmente importante si no puede sentir dolor, calor o fro. Puede correr un riesgo mayor de sufrir quemaduras.  Sumrjase en un bao clido. Esto puede ayudar a Engineer, materials.      Actividad  Evite agacharse y Education officer, environmental otras actividades que Forensic scientist.  Cuando est de pie: ? Mantenga la parte alta de la espalda y el cuello rectos. ? Mantenga los hombros [employer]. ? Evite encorvarse.  Cuando est sentado: ? Mantenga la espalda recta. ? Relaje los hombros. No curve los hombros ni los [employer].  No permanezca sentado o de pie en el mismo lugar durante mucho tiempo.  Durante el da, haga pausas breves para descansar. Por lo general, recostarse o Personal assistant de pie es mejor que permanecer sentado. Descansar puede ayudar a Pharmacologist.  Cuando est sentado o de pie por [employer], haga un poco de [place] o ejercicios de estiramiento. Esto ayudar a Transport planner rigidez y Chief Technology Officer.  Haga ejercicio con regularidad. Pregntele al mdico qu actividades son seguras para usted.  No levante ningn objeto que pese ms de 10libras (4.5kg) o el lmite de peso que le indiquen hasta que el mdico le diga que puede [place].  Para evitar lesionarse cuando levanta objetos: ? Flexione las rodillas. ? Mantenga el peso cerca del cuerpo. ? No se tuerza.  Duerma sobre un [employer]. Intente acostarse de costado, con las rodillas ligeramente flexionadas. Si se recuesta [employer], coloque una almohada debajo de las rodillas.   Medicamentos  El tratamiento puede incluir medicamentos para Chief Technology Officer y la hinchazn que se toman por boca o que se ponen sobre la piel, analgsicos recetados o relajantes musculares.  Use los medicamentos de venta libre y los recetados solamente como se lo haya indicado el mdico.  Consulte a su mdico si el medicamento que le recetaron: ? Hace necesario que evite conducir o usar [place]. ? Puede causarle dificultad para defecar (estreimiento). Es posible que deba tomar medidas para prevenir o tratar los problemas para defecar:  Product manager suficiente lquido para Radio producer pis (la orina) de color amarillo plido.  Usar medicamentos recetados o de Sales promotion account executive.  Comer alimentos ricos en fibra. Entre ellos, frijoles, cereales integrales y frutas y  verduras frescas.  Limitar los alimentos con alto contenido de grasa y azcar. Estos incluyen alimentos fritos o dulces. Instrucciones generales  No consuma ningn producto que contenga nicotina o tabaco, como cigarrillos, cigarrillos electrnicos y tabaco de mascar. Si necesita ayuda para dejar de consumir estos productos, consulte al mdico.  Concurra a todas las visitas de seguimiento como se lo haya indicado el mdico. Esto es  importante. Comunquese con un mdico si:  El dolor no se alivia con reposo ni con medicamentos.  Su dolor empeora o tiene un dolor nuevo.  Tiene fiebre alta.  Pierde peso muy rpidamente.  Tiene dificultad para realizar las actividades cotidianas. Solicite ayuda de inmediato si:  Siente debilidad en una o ambas piernas o pies.  Pierde la sensibilidad (adormecimiento) en una o ambas piernas o pies.  Tiene dificultad para controlar la materia fecal (las deposiciones) o el pis (la orina).  Tiene un dolor muy intenso en la espalda y: ? Tiene ganas de vomitar (nuseas) o vomita. ? Siente dolor en el vientre (abdomen). ? Le falta el aire. ? Se desmaya. Resumen  Cuando el dolor en la espalda dura ms de 3 meses, se denomina dolor de espalda crnico.  El dolor puede empeorar en ciertos momentos (exacerbaciones).  Use hielo y calor segn le indique el mdico. El mdico puede indicarle que use hielo luego del comienzo de las exacerbaciones. Esta informacin no tiene como fin reemplazar el consejo del mdico. Asegrese de hacerle al mdico cualquier pregunta que tenga. Document Revised: 12/23/2019 Document Reviewed: 12/23/2019 Elsevier Patient Education  2021 Elsevier Inc.  

## 2020-10-12 NOTE — Progress Notes (Signed)
Here today for lower abdominal pain and dizziness for 3 weeks. Having pain in lower back also.

## 2020-10-13 ENCOUNTER — Ambulatory Visit: Payer: Self-pay | Attending: Family Medicine

## 2020-10-13 DIAGNOSIS — E118 Type 2 diabetes mellitus with unspecified complications: Secondary | ICD-10-CM

## 2020-10-13 DIAGNOSIS — Z1159 Encounter for screening for other viral diseases: Secondary | ICD-10-CM

## 2020-10-14 LAB — TSH: TSH: 3.05 u[IU]/mL (ref 0.450–4.500)

## 2020-10-14 LAB — T4, FREE: Free T4: 1.06 ng/dL (ref 0.82–1.77)

## 2020-10-15 ENCOUNTER — Other Ambulatory Visit: Payer: Self-pay | Admitting: Family Medicine

## 2020-10-15 DIAGNOSIS — E7800 Pure hypercholesterolemia, unspecified: Secondary | ICD-10-CM

## 2020-10-15 LAB — HCV RNA QUANT RFLX ULTRA OR GENOTYP: HCV Quant Baseline: NOT DETECTED IU/mL

## 2020-10-15 LAB — LIPID PANEL
Chol/HDL Ratio: 5.3 ratio — ABNORMAL HIGH (ref 0.0–4.4)
Cholesterol, Total: 184 mg/dL (ref 100–199)
HDL: 35 mg/dL — ABNORMAL LOW (ref >39)
LDL Chol Calc (NIH): 117 mg/dL — ABNORMAL HIGH (ref 0–99)
Triglycerides: 181 mg/dL — ABNORMAL HIGH (ref 0–149)
VLDL Cholesterol Cal: 32 mg/dL (ref 5–40)

## 2020-10-15 LAB — MICROALBUMIN / CREATININE URINE RATIO
Creatinine, Urine: 63.6 mg/dL
Microalb/Creat Ratio: 15 mg/g creat (ref 0–29)
Microalbumin, Urine: 9.7 ug/mL

## 2020-10-15 MED ORDER — ATORVASTATIN CALCIUM 40 MG PO TABS: 40.0000 mg | ORAL_TABLET | Freq: Every day | ORAL | 6 refills | Status: DC

## 2020-10-16 LAB — GLUCOSE, POCT (MANUAL RESULT ENTRY): POC Glucose: 225 mg/dl — AB (ref 70–99)

## 2020-10-23 ENCOUNTER — Telehealth: Payer: Self-pay

## 2020-10-23 ENCOUNTER — Telehealth: Payer: Self-pay | Admitting: Family Medicine

## 2020-10-23 NOTE — Telephone Encounter (Signed)
Patient name and DOB has been verified Patient was informed of lab results. Patient had no questions.  

## 2020-10-23 NOTE — Telephone Encounter (Signed)
Copied from CRM [id number]. Topic: General - Other >> Oct 21, 2020  2:07 PM Cuthrell, Pearlean Brownie wrote: Reason for CRM: Patient called and is requesting a call with Lab results from Dr. Alvis Lemmings, patient needs spanish interpreter, CB: [phone]

## 2020-10-23 NOTE — Telephone Encounter (Signed)
Pt has beed called and lab results has been given.

## 2020-10-23 NOTE — Telephone Encounter (Signed)
-----   Message from Hoy Register, MD sent at 10/15/2020  2:38 PM EST ----- Cholesterol is still slightly elevated.  I have increased atorvastatin from 20 mg to 40 mg.  Please advised to adhere to a low-cholesterol diet and exercise.  Other labs are stable.

## 2020-10-29 ENCOUNTER — Ambulatory Visit (INDEPENDENT_AMBULATORY_CARE_PROVIDER_SITE_OTHER): Payer: Self-pay | Admitting: Obstetrics and Gynecology

## 2020-10-29 ENCOUNTER — Other Ambulatory Visit: Payer: Self-pay

## 2020-10-29 DIAGNOSIS — F32A Depression, unspecified: Secondary | ICD-10-CM

## 2020-10-29 DIAGNOSIS — Z3201 Encounter for pregnancy test, result positive: Secondary | ICD-10-CM

## 2020-10-29 NOTE — Progress Notes (Signed)
Pt here today for pregnancy test resulting positive.  With Spanish Interpreter Eda R., reports LMP 07/28/20 EDD 05/04/21 and 13w 2d today.  Pt denies vaginal bleeding and pain.  Medications/allergies reviewed.  As I am verifying her medications pt begins to cry and hold her belly and informs me that her 46 year old son committed suicide.  Pt also reports that her 67 year old child witnessed the suicide as she did when they came into her sons room.  Pt continues to inform me that her husband could not handle it and she found him outside while loud music is playing to find her husband kneeled down on the grass in the back yard.  Pt states "I heard my husband say thank you God".  Pt states that she looked up to the tree next to her husband and there was a noose hanging.  Pt states that this baby is a miracle from God.  I asked pt if she was seeing anyone to help her and her family to get through this event.  Pt verbalized that her and her family are seeing therapy together and individually.  I informed pt that I would send a referral to our [facility] to also reach out to her however to continue to go to her therapy sessions.  I clarified medications with pt and asked if she is checking her blood sugars.  Pt reports that she has not been checking her blood sugar because she is afraid to prick her finger.  Pt also informed me that she was prescribed insulin however she did not want to do that so she is only taking Metformin for diabetes.  Referral placed for diabetes education asap.  Pt reports that she is also taking Lipitor.  Pt advised to stop taking Lipitor per Dr. Alysia Penna.  Pt encouraged to start taking a baby ASA and PNV.  Pt advised that she also needs to start OB care asap.  Pt verbalized understanding to all that was discussed.  Pt did not have any other questions.    Addison Naegeli, RN  10/29/20

## 2020-10-30 ENCOUNTER — Encounter: Payer: Self-pay | Admitting: Obstetrics and Gynecology

## 2020-10-30 LAB — POCT PREGNANCY, URINE: Preg Test, Ur: POSITIVE — AB

## 2020-11-03 ENCOUNTER — Other Ambulatory Visit: Payer: Self-pay

## 2020-11-03 ENCOUNTER — Encounter: Payer: No Typology Code available for payment source | Attending: Family Medicine | Admitting: Registered"

## 2020-11-03 ENCOUNTER — Other Ambulatory Visit: Payer: Self-pay | Admitting: Family Medicine

## 2020-11-03 ENCOUNTER — Ambulatory Visit: Payer: Self-pay | Admitting: Registered"

## 2020-11-03 DIAGNOSIS — O24111 Pre-existing diabetes mellitus, type 2, in pregnancy, first trimester: Secondary | ICD-10-CM

## 2020-11-03 DIAGNOSIS — O24419 Gestational diabetes mellitus in pregnancy, unspecified control: Secondary | ICD-10-CM | POA: Insufficient documentation

## 2020-11-03 DIAGNOSIS — E118 Type 2 diabetes mellitus with unspecified complications: Secondary | ICD-10-CM

## 2020-11-03 DIAGNOSIS — O24119 Pre-existing diabetes mellitus, type 2, in pregnancy, unspecified trimester: Secondary | ICD-10-CM | POA: Insufficient documentation

## 2020-11-03 MED ORDER — ALCOHOL SWABS PADS: 1.0000 | MEDICATED_PAD | Freq: Every day | 11 refills | Status: DC

## 2020-11-03 MED ORDER — METFORMIN HCL 1000 MG PO TABS: 1000.0000 mg | ORAL_TABLET | Freq: Two times a day (BID) | ORAL | 11 refills | Status: DC

## 2020-11-03 MED ORDER — INSULIN ISOPHANE HUMAN 100 UNIT/ML KWIKPEN: 7.0000 [IU] | PEN_INJECTOR | Freq: Two times a day (BID) | SUBCUTANEOUS | 11 refills | Status: DC

## 2020-11-03 MED ORDER — INSULIN ASPART 100 UNIT/ML FLEXPEN: 15.0000 [IU] | PEN_INJECTOR | Freq: Three times a day (TID) | SUBCUTANEOUS | 11 refills | Status: DC

## 2020-11-03 MED ORDER — "PEN NEEDLES 5/16"" 31G X 8 MM MISC": 1.0000 | Freq: Every day | 11 refills | Status: AC

## 2020-11-03 NOTE — Progress Notes (Signed)
T2DM insulin orders in pregnancy

## 2020-11-03 NOTE — Progress Notes (Signed)
Interpreter services provided by Raquel from [place]  Patient was seen on 11/03/20 for Type 2 Diabetes in Pregnancy self-management. EDD 05/04/21 and [redacted]w[redacted]d.  Pt is scheduled for new OB visit on 11/12/20 [facility] office visit for positive pregnancy scheduled on 10/29/20  Medications:  (current) Metformin 500 mg; 3 am pills and 2 pm pills. Patient taking differently 500 mg bid because she states she did not understand dosing instructions (New): change metformin to 1000 mg bid per Dr. Eckstat  Discontinued: cholesterol medication a glipizide Metformin 500 bid; did not understand instruction 3 am 2 pm, but will start now that she understands  Patient stated fear of needles and checking blood sugar. Patient was able to successfully test herself today without trouble.   24 hr dietary recall:  (pt states she is allergic to fruit unless it is organic) Breakfast: coffee with 4 oz milk and 1 Tbs sugar, 1 piece of bread Lunch: beans, eggs, 3 small tortillas, cabbage Dinner: fried chicken and beans  The following learning objectives were met by the patient :   States when to check blood glucose levels  Demonstrates proper blood glucose monitoring techniques  States the effect of stress on blood glucose levels  Plan:  Include protein with breakfast, no sugar in coffee Return asap for follow-up to learn how to use insulin that will be prescribed today. Begin checking Blood Glucose before breakfast and 2 hours after first bite of breakfast, lunch and dinner as directed by MD  Bring Log Book/Sheet and meter to every medical appointment   Blood glucose monitor given: Prodigy  Lot # [id number] CBG: 348 mg/dL  Patient instructed to monitor glucose levels: FBS: 60 - 95 mg/dl 2 hour: <120 mg/dl  Patient received the following handouts:  Blood glucose Log Sheet  Patient will be seen for follow-up asap for insulin instruction.  

## 2020-11-04 ENCOUNTER — Other Ambulatory Visit: Payer: Self-pay | Admitting: Family Medicine

## 2020-11-04 ENCOUNTER — Ambulatory Visit: Payer: Self-pay | Admitting: Registered"

## 2020-11-04 ENCOUNTER — Telehealth: Payer: Self-pay | Admitting: Family Medicine

## 2020-11-04 ENCOUNTER — Encounter: Payer: No Typology Code available for payment source | Admitting: Registered"

## 2020-11-04 DIAGNOSIS — E118 Type 2 diabetes mellitus with unspecified complications: Secondary | ICD-10-CM

## 2020-11-04 DIAGNOSIS — O24119 Pre-existing diabetes mellitus, type 2, in pregnancy, unspecified trimester: Secondary | ICD-10-CM

## 2020-11-04 MED ORDER — INSULIN LISPRO (1 UNIT DIAL) 100 UNIT/ML (KWIKPEN): 15.0000 [IU] | PEN_INJECTOR | Freq: Three times a day (TID) | SUBCUTANEOUS | 11 refills | Status: DC

## 2020-11-04 MED ORDER — INSULIN ISOPHANE HUMAN 100 UNIT/ML KWIKPEN: 11.0000 [IU] | PEN_INJECTOR | Freq: Two times a day (BID) | SUBCUTANEOUS | 11 refills | Status: DC

## 2020-11-04 NOTE — Progress Notes (Addendum)
Interpreter Terrial Rhodes # [phone] [place] [employer]   RD called to update patient per Dr. Allen Derry request  to use the NPH to 11 units am and 11 units pm instead of the original instruction of 14 units and 7 units pm.  Insulin Instruction  Patient was seen on 11/04/20 for insulin instruction.  Clayton Lefort MD orders are: NPH (Humulin N) 11 units in the morning and 11 units at night Humalog 15 units tid with meals. Do not inject if you do not eat. (also continue Metformin but reduce to 1000 mg bid)  The following learning objectives were met by the patient during this visit:   Insulin Action of NPH and Rapid acting insulins  Use of Insulin Pens  Hygiene and storage  Rotation of Sites  Hypoglycemia- symptoms, causes, treatment choices  Record keeping and MD follow up  Patient demonstrated understanding of insulin administration by return demonstration.  Patient received the following handouts:  Insulin Instruction Handout                                        Patient to start on insulin as Rx'd by MD  Patient will be seen for follow-up in 2 weeks or as needed.

## 2020-11-04 NOTE — Progress Notes (Signed)
Adjusting NPH dosing to 11u BID, entered in previously incorrectly as 14 AM/7 PM

## 2020-11-04 NOTE — Telephone Encounter (Signed)
Returned pharmacy's call. Spoke with Carney Bern.   Pharmacy wants clarification on Metformin dosage, reviewed last note indicates patient should be taking 1000 mg BID.   Pharmacy indicated that changing Insulin pen from Novalog to Humalog will save patient money as different manufacturers. Prescription changes sent to Pharmacy. Needles she has will work with both, insulins work the same.

## 2020-11-04 NOTE — Telephone Encounter (Signed)
Pharmacy called want to speak about changing patient insulin [phone]

## 2020-11-05 ENCOUNTER — Other Ambulatory Visit: Payer: Self-pay | Admitting: *Deleted

## 2020-11-05 ENCOUNTER — Telehealth: Payer: Self-pay | Admitting: Registered"

## 2020-11-05 DIAGNOSIS — O24119 Pre-existing diabetes mellitus, type 2, in pregnancy, unspecified trimester: Secondary | ICD-10-CM

## 2020-11-05 NOTE — Telephone Encounter (Signed)
Spoke with patient during telephone call with [name], RD. Discussed normal cramping during early pregnancy vs when to go to hospital for assessment; vaginal bleeding or severe pain. Recommended patient try 2 extra strength tylenol for pain. Reviewed visitor policy at patient's request. Call returned to RD.

## 2020-11-05 NOTE — Telephone Encounter (Signed)
Interpreter services provided by Vernona Rieger [phone] from [employer]. (This call took 50 minutes)  Pt is ~14w, has new OB visit scheduled for next week. RD called patient to see how she is doing with her new insulin regimen.  Pt states when injecting insulin this morning felt a little nervous but she is fine with it now with injecting insulin. Verified that she understood when to take NPH and Humalog.  Patient asked if she should still take metformin and RD confirmed that she should take 1000 mg bid.   Pt also had question about how to arm the lancing device. RD walked her through it and she tested her blood sugar while on the call and it 69 mg/dL pt reports symptoms dizzy and blurry vision. Pt rechecked blood sugar and it was 243 mg/dL states she ate around 11 am didn't take insulin because did not eat very much food.   Patient did not start insulin until this morning 11/04/20 Last night 200 mg/dL;  2/77/41  FBS: 287 mg/dL; 11 units NPH.  Ate breakfast but since it wasn't much food (no tortilla) did not take Novolog, 2 hrs post was 221 mg/dL.  Pt asked if she has an ultrasound scheduled and it does not look like it. Pt was concerned about pain. [name], RN came in and addressed patient's concerns and advised what symptoms would warrant a visit to the MAU as well as visitor policy

## 2020-11-09 ENCOUNTER — Telehealth: Payer: Self-pay | Admitting: Family Medicine

## 2020-11-09 ENCOUNTER — Telehealth: Payer: Self-pay | Admitting: Registered"

## 2020-11-09 ENCOUNTER — Other Ambulatory Visit: Payer: Self-pay

## 2020-11-09 ENCOUNTER — Encounter (HOSPITAL_COMMUNITY): Payer: Self-pay | Admitting: Family Medicine

## 2020-11-09 ENCOUNTER — Inpatient Hospital Stay (HOSPITAL_COMMUNITY): Payer: No Typology Code available for payment source

## 2020-11-09 ENCOUNTER — Inpatient Hospital Stay (HOSPITAL_COMMUNITY)
Admission: AD | Admit: 2020-11-09 | Discharge: 2020-11-09 | Disposition: A | Discharge status: Home / Self Care | Payer: No Typology Code available for payment source | Attending: Family Medicine | Admitting: Family Medicine

## 2020-11-09 DIAGNOSIS — O3680X0 Pregnancy with inconclusive fetal viability, not applicable or unspecified: Secondary | ICD-10-CM

## 2020-11-09 DIAGNOSIS — R109 Unspecified abdominal pain: Secondary | ICD-10-CM

## 2020-11-09 DIAGNOSIS — Z3A01 Less than 8 weeks gestation of pregnancy: Secondary | ICD-10-CM

## 2020-11-09 DIAGNOSIS — O209 Hemorrhage in early pregnancy, unspecified: Secondary | ICD-10-CM | POA: Insufficient documentation

## 2020-11-09 DIAGNOSIS — Z3A08 8 weeks gestation of pregnancy: Secondary | ICD-10-CM | POA: Insufficient documentation

## 2020-11-09 DIAGNOSIS — R1031 Right lower quadrant pain: Secondary | ICD-10-CM | POA: Insufficient documentation

## 2020-11-09 DIAGNOSIS — R1032 Left lower quadrant pain: Secondary | ICD-10-CM | POA: Insufficient documentation

## 2020-11-09 DIAGNOSIS — O26891 Other specified pregnancy related conditions, first trimester: Secondary | ICD-10-CM | POA: Insufficient documentation

## 2020-11-09 DIAGNOSIS — F4321 Adjustment disorder with depressed mood: Secondary | ICD-10-CM

## 2020-11-09 DIAGNOSIS — O4691 Antepartum hemorrhage, unspecified, first trimester: Secondary | ICD-10-CM

## 2020-11-09 LAB — CBC
HCT: 39.3 % (ref 36.0–46.0)
Hemoglobin: 13.1 g/dL (ref 12.0–15.0)
MCH: 29.4 pg (ref 26.0–34.0)
MCHC: 33.3 g/dL (ref 30.0–36.0)
MCV: 88.1 fL (ref 80.0–100.0)
Platelets: 333 10*3/uL (ref 150–400)
RBC: 4.46 MIL/uL (ref 3.87–5.11)
RDW: 12.9 % (ref 11.5–15.5)
WBC: 8.5 10*3/uL (ref 4.0–10.5)
nRBC: 0 % (ref 0.0–0.2)

## 2020-11-09 LAB — ABO/RH: ABO/RH(D): O POS

## 2020-11-09 LAB — HCG, QUANTITATIVE, PREGNANCY: hCG, Beta Chain, Quant, S: 2064 m[IU]/mL — ABNORMAL HIGH (ref <5)

## 2020-11-09 LAB — GLUCOSE, CAPILLARY: Glucose-Capillary: 174 mg/dL — ABNORMAL HIGH (ref 70–99)

## 2020-11-09 NOTE — Telephone Encounter (Signed)
Patient called [facility] this morning and left a message using an interpreter. Pt said that after taking insulin her blood sugar came down and wanted to know if she should keep taking it. Patient was admitted to [facility]U with pregnancy of unknown anatomic location (as of 5:10 pm)  RD did not return patient phone call due to current hospital admission status. RD will check status of patient tomorrow.

## 2020-11-09 NOTE — MAU Note (Signed)
Presents with c/o VB and right lower abdominal pain that began this morning. Reports  VB is with wiping only, but it's a lot.  LMP 12/21/ 2021, small amount of VB.

## 2020-11-09 NOTE — Progress Notes (Signed)
   11/09/20 1715  Clinical Encounter Type  Visited With Patient  Visit Type Initial  Referral From Nurse  Consult/Referral To Chaplain  Spiritual Encounters  Spiritual Needs Prayer  Stress Factors  Patient Stress Factors Health changes  Chaplain responded to the page from MAU.  Mrs. Alexandra Aguirre requested prayer, she is 51/2 weeks. Ms.Marda, the interpreter came to translate.  Offered prayer, support, comfort and compassion.  Mrs. Alexandra Aguirre is grieving the loss of her son and concerned about her family and we took a  moment to pray on this specifically.  Mrs. Alexandra Aguirre was grateful for the visit and prayer.  Chaplain Roark Rufo Morgan-Simpson [phone]

## 2020-11-09 NOTE — MAU Note (Signed)
Provider went in to give patient results with interpretor.Pt became upset due to potential miscarriage . Explained that we are still unsure about if she is miscarrying ans need to come back to office on Thursday. Pt became inconsolable due to fact she is still morning the loss of her 46 year old son and feels this baby is a replacement for him. Called chaplin per her request..

## 2020-11-09 NOTE — MAU Note (Signed)
Chaplin at bedside with interpretor.

## 2020-11-09 NOTE — Discharge Instructions (Signed)
Return to care   If you have heavier bleeding that soaks through more that 2 pads per hour for an hour or more  If you bleed so much that you feel like you might pass out or you do pass out  If you have significant abdominal pain that is not improved with Tylenol      Vaginal Bleeding During Pregnancy, First Trimester A small amount of bleeding from the vagina, or spotting, is common during early pregnancy. Some bleeding may be related to the pregnancy, and some may not. In many cases, the bleeding is normal and is not a problem. However, bleeding can also be a sign of something serious. Normal things that may cause bleeding during the first trimester:  Implantation of the fertilized egg in the lining of the uterus.  Rapid changes in blood vessels. This is caused by changes that are happening to the body during pregnancy.  Sex.  Pelvic exams. Abnormal things that may cause bleeding during the first trimester include:  Infection or inflammation of the cervix.  Growths or polyps on the cervix.  Miscarriage or threatened miscarriage.  Pregnancy that is growing outside of the uterus (ectopic pregnancy).  A fertilized egg that becomes a mass of tissue (molar pregnancy). Tell your health care provider right away if there is any bleeding from your vagina. Follow these instructions at home: Monitoring your bleeding Monitor your bleeding.  Pay attention to any changes in your symptoms. Let your health care provider know about any concerns.  Try to understand when the bleeding occurs. Does the bleeding start on its own, or does it start after something is done, such as sex or a pelvic exam?  Use a diary to record the things you see about your bleeding, including: ? The kind of bleeding you are having. Does the bleeding start and stop irregularly, or is it a constant flow? ? The severity of your bleeding. Is the bleeding heavy or light? ? The number of pads you use each day, how  often you change them, and how soaked they are.  Tell your health care provider if you pass tissue. He or she may want to see it.   Activity  Follow instructions from your health care provider about limiting your activity. Ask what activities are safe for you.  Do not have sex until your health care provider says that this is safe.  If needed, make plans for someone to help with your regular activities. General instructions  Take over-the-counter and prescription medicines only as told by your health care provider.  Do not take aspirin because it can cause bleeding.  Do not use tampons or douche.  Keep all follow-up visits. This is important. Contact a health care provider if:  You have vaginal bleeding during any part of your pregnancy.  You have cramps or labor pains.  You have a fever or chills. Get help right away if:  You have severe cramps in your back or abdomen.  You pass large clots or a large amount of tissue from your vagina.  Your bleeding increases.  You feel light-headed or weak, or you faint.  You are leaking fluid or have a gush of fluid from your vagina. Summary  A small amount of bleeding from the vagina is common during early pregnancy.  Be sure to tell your health care provider about any vaginal bleeding right away.  Try to understand when bleeding occurs. Does bleeding occur on its own, or does it occur after   something is done, such as sex or pelvic exams?  Keep all follow-up visits. This is important. This information is not intended to replace advice given to you by your health care provider. Make sure you discuss any questions you have with your health care provider. Document Revised: 05/28/2020 Document Reviewed: 05/28/2020 Elsevier Patient Education  2021 Elsevier Inc.  

## 2020-11-09 NOTE — Telephone Encounter (Signed)
Patient state she has been feeling bad since she started taking her new  Insulin  And she is also worried that she is not feeing her baby move

## 2020-11-09 NOTE — MAU Provider Note (Signed)
History     CSN: [id number]  Arrival date and time: 11/09/20 1354   Event Date/Time   First Provider Initiated Contact with Patient 11/09/20 1430     *Spanish interpreter at bedside for this encounter*  Chief Complaint  Patient presents with  . Vaginal Bleeding  . Abdominal Pain   HPI Alexandra Aguirre is a 45 y.o. [id number] at [redacted]w[redacted]d who presents with abdominal pain & vaginal bleeding. Symptoms started this morning. Reports red/brown blood on toilet paper when she wipes. Not saturating pads or passing blood clots. Reports bilateral lower abdominal pain that is sharp & intermittent. Rates pain 9/10. Hasn't treated symptoms. Nothing makes better or worse. Denies fever, n/v/d, or dysuria.   OB History    Gravida  6   Para  5   Term  5   Preterm      AB      Living  4     SAB      IAB      Ectopic      Multiple      Live Births  5           Past Medical History:  Diagnosis Date  . Abnormal Pap smear of cervix 09/24/2013   AGUS  . Asthma   . Diabetes mellitus without complication (HCC)   . Environmental allergies   . GERD (gastroesophageal reflux disease)     Past Surgical History:  Procedure Laterality Date  . CERVICAL BIOPSY  W/ LOOP ELECTRODE EXCISION    . removal of cyst Right 2011    Family History  Problem Relation Age of Onset  . Cancer Mother        cervical/ovarian  . Diabetes Mother   . Diabetes Maternal Uncle   . Diabetes Brother   . Diabetes Maternal Uncle   . Diabetes Maternal Uncle     Social History   Tobacco Use  . Smoking status: Never Smoker  . Smokeless tobacco: Never Used  Vaping Use  . Vaping Use: Never used  Substance Use Topics  . Alcohol use: No  . Drug use: No    Allergies:  Allergies  Allergen Reactions  . Fruit & Vegetable Daily [Nutritional Supplements] Other (See Comments)    "mouth rash" from Avocado, apple, peaches, plums, kiwi    No medications prior to admission.    Review of Systems   Constitutional: Negative.   Gastrointestinal: Positive for abdominal pain. Negative for diarrhea, nausea and vomiting.  Genitourinary: Positive for vaginal bleeding. Negative for dysuria and vaginal discharge.   Physical Exam   Blood pressure 120/66, pulse 79, temperature 98.7 F (37.1 C), temperature source Oral, resp. rate 18, weight 83 kg, last menstrual period 09/08/2020, SpO2 99 %.  Physical Exam Vitals and nursing note reviewed. Exam conducted with a chaperone present.  Constitutional:      General: She is not in acute distress.    Appearance: She is well-developed. She is obese.  HENT:     Head: Normocephalic and atraumatic.  Pulmonary:     Effort: Pulmonary effort is normal. No respiratory distress.  Abdominal:     Palpations: Abdomen is soft.     Tenderness: There is no abdominal tenderness.  Genitourinary:    Exam position: Lithotomy position.     Vagina: Bleeding present.     Cervix: Normal.     Uterus: Normal.   Skin:    General: Skin is warm and dry.  Neurological:     Mental Status:  She is alert.  Psychiatric:        Mood and Affect: Mood is depressed. Affect is tearful.     MAU Course  Procedures Results for orders placed or performed during the hospital encounter of 11/09/20 (from the past 24 hour(s))  CBC     Status: None   Collection Time: 11/09/20  3:00 PM  Result Value Ref Range   WBC 8.5 4.0 - 10.5 K/uL   RBC 4.46 3.87 - 5.11 MIL/uL   Hemoglobin 13.1 12.0 - 15.0 g/dL   HCT 06.2 69.4 - 85.4 %   MCV 88.1 80.0 - 100.0 fL   MCH 29.4 26.0 - 34.0 pg   MCHC 33.3 30.0 - 36.0 g/dL   RDW 62.7 03.5 - 00.9 %   Platelets 333 150 - 400 K/uL   nRBC 0.0 0.0 - 0.2 %  ABO/Rh     Status: None   Collection Time: 11/09/20  3:00 PM  Result Value Ref Range   ABO/RH(D) O POS    No rh immune globuloin      NOT A RH IMMUNE GLOBULIN CANDIDATE, PT RH POSITIVE Performed at [facility], [address], [place], [place] [address]   hCG, quantitative,  pregnancy     Status: Abnormal   Collection Time: 11/09/20  3:00 PM  Result Value Ref Range   hCG, Beta Chain, Quant, S 2,064 (H) <5 mIU/mL  Glucose, capillary     Status: Abnormal   Collection Time: 11/09/20  5:17 PM  Result Value Ref Range   Glucose-Capillary 174 (H) 70 - 99 mg/dL    US OB LESS THAN 14 WEEKS WITH OB TRANSVAGINAL  Result Date: 11/09/2020 CLINICAL DATA:  Vaginal bleeding and right-sided abdominal pain since this morning EXAM: OBSTETRIC <14 WK [place] AND TRANSVAGINAL OB US TECHNIQUE: Both transabdominal and transvaginal ultrasound examinations were performed for complete evaluation of the gestation as well as the maternal uterus, adnexal regions, and pelvic cul-de-sac. Transvaginal technique was performed to assess early pregnancy. COMPARISON:  Pelvic ultrasound July 05, 2017 FINDINGS: Intrauterine gestational sac: Single Yolk sac:  Not Visualized. Embryo:  Not Visualized. Cardiac Activity: Not Visualized. MSD: 7.6 mm   5 w   3 d Subchorionic hemorrhage:  None visualized. Maternal uterus/adnexae: Corpus luteum in the right ovary. IMPRESSION: Single intrauterine pregnancy of uncertain viability. Recommend follow-up with serial beta hCGs, repeat ultrasound in 7-14 days and OB consult. Electronically Signed   By: Maudry Mayhew MD   On: 11/09/2020 15:59    MDM +UPT UA, CBC, ABO/Rh, quant hCG, and [place] today to rule out ectopic pregnancy which can be life threatening.   Ultrasound shows empty IUGS. No adnexal masses. HCG today is 2064. C/w Dr. Adrian Blackwater, will repeat HCG on Thursday.   Discussed results with patient. Concern for miscarriage due to bleeding and ultrasound since patient should be >8 weeks based on LMP. Can't exclude normal pregnancy too early to be seen or ectopic pregnancy. Will follow closely and track HCGs.  Patient very tearful due to recent death of her son & possible miscarriage. States she sees a therapist weekly but she missed her last appointment. Will see  spiritual care while here in MAU.  Message to the office for stat HCG on Thursday, but will keep appointment with MD to review HCG results, and review blood sugars.   Assessment and Plan   1. Pregnancy of unknown anatomic location   2. Vaginal bleeding in pregnancy, first trimester   3. Abdominal pain  during pregnancy in first trimester   4. Grief at loss of child    -reviewed reasons to return to MAU -continue taking insulin as previously prescribed and track blood sugars -s/w Mayra Neer prior to patient's discharge - pt scheduled for lab at 11 am on Thursday then will return at 155 as previously scheduled to discuss results & go over CBGs/insulin management  Judeth Horn 11/09/2020, 6:33 PM

## 2020-11-10 ENCOUNTER — Telehealth: Payer: Self-pay | Admitting: Lactation Services

## 2020-11-10 NOTE — Telephone Encounter (Signed)
Patient called and LM in Spanish on the nurse voicemail. No interpreter present in the office. Need to call patient back to discuss her concerns.

## 2020-11-11 ENCOUNTER — Other Ambulatory Visit: Payer: Self-pay | Admitting: Lactation Services

## 2020-11-11 ENCOUNTER — Telehealth: Payer: Self-pay | Admitting: Lactation Services

## 2020-11-11 DIAGNOSIS — O209 Hemorrhage in early pregnancy, unspecified: Secondary | ICD-10-CM

## 2020-11-11 NOTE — Telephone Encounter (Addendum)
Patient called front office requesting a call back.   Called patient with assistance of [address] Spanish Telephone  Luana Shu # [phone].   Patient voiced that she went to the hospital on Monday due to bleeding.  She reports the bleeding has not stopped and she is having pain in the lower abdomen. She is also seeing blood clots.   Patient reports she was told that they did not where the blood was coming from.   Reviewed with patient that the level on Thursday will determine if the levels are rising or falling. Reviewed that if falling it would confirm if she is having a miscarriage or if the pregnancy is progressing as it should.   Reviewed it is concerning that the bleeding has increased but will not have full information until we get the follow up level and [place].   Patient reports she is changing her pad, she reports she is mostly passing blood when she goes to the bathroom. She reports her pad has spotting and when she goes to the bathroom about every 15 minutes she notices blood pouring out and her toilet paper is soaked.   She reports she is having headaches, she is very worried. She denies being dizzy.   Reviewed case with Dr. Alysia Penna. Advised patient that this does sound like a miscarriage. Reviewed she can follow up in the morning for her Beta Hcg for confirmation. Reviewed if she her bleeding increases more, does not slow down,  she is soaking more than one pad an hour, or start feeling weak or dizzy then we would like for you to go to the hospital for evaluation. Reviewed that we would like to have her follow up with an [place] in 7-14 days per Dr. Alysia Penna. Will schedule and call patient back with [place] report.   Patient very tearful on the phone.

## 2020-11-11 NOTE — Telephone Encounter (Signed)
Follow up Viability [place] scheduled for 3/4 at 11 am at [facility]. Patient to arrive at 10:45 with full bladder.   Called patient with assistance of [employer] Spanish Jerlyn Ly # [id number].   Patient advised of [place] date, time and location. She was advised to arrive at 10:45 with a full bladder. Patient voiced understanding.   Patient asked about Tylenol 325 mg, she was advised she can take 2 every 4-6 hours. Patient reports her bleeding has slowed down some and she is no longer passing clots.   Patient to call the office with any further questions or concerns. Patient plans to come in at 11 am tomorrow for Beta Hcg and appointment with MD in the afternoon depending on the Hcg levels.

## 2020-11-12 ENCOUNTER — Encounter: Payer: Self-pay | Admitting: Family Medicine

## 2020-11-12 ENCOUNTER — Other Ambulatory Visit (INDEPENDENT_AMBULATORY_CARE_PROVIDER_SITE_OTHER): Payer: Self-pay

## 2020-11-12 ENCOUNTER — Ambulatory Visit (INDEPENDENT_AMBULATORY_CARE_PROVIDER_SITE_OTHER): Payer: Self-pay | Admitting: Family Medicine

## 2020-11-12 ENCOUNTER — Other Ambulatory Visit: Payer: Self-pay

## 2020-11-12 VITALS — BP 119/65 | HR 81 | Wt 183.0 lb

## 2020-11-12 DIAGNOSIS — O039 Complete or unspecified spontaneous abortion without complication: Secondary | ICD-10-CM

## 2020-11-12 DIAGNOSIS — O209 Hemorrhage in early pregnancy, unspecified: Secondary | ICD-10-CM

## 2020-11-12 DIAGNOSIS — Z3A09 9 weeks gestation of pregnancy: Secondary | ICD-10-CM

## 2020-11-12 LAB — BETA HCG QUANT (REF LAB): hCG Quant: 275 m[IU]/mL

## 2020-11-12 NOTE — Progress Notes (Signed)
Pt here today for STAT Beta lab for s/p questionable miscarriage.  Video Interpreter # Jonetta Speak [phone]  Pt reports that she is having bleeding like a period and had a large clot (pt had a picture in her phone) with mild pain.  Pt advised that it will take approximately two hours for results in which the provider will discuss at her provider appt scheduled at 1355.  Pt verbalized understanding.    Addison Naegeli, RN  11/12/20

## 2020-11-12 NOTE — Progress Notes (Signed)
° ° °  Vaginal bleeding in Pregnancy ER F/U  Subjective:   Alexandra Aguirre is a 45 y.o. [phone] female at [redacted]w[redacted]d of pregnancy presenting for f/u after MAU visit.    Date 11/09/2020 11/12/2020  BHCG 2064 275   Reports cramping. Improves with tylenol. Had bleeding in the past 48 hours.   The following portions of the patient's history were reviewed and updated as appropriate: allergies, current medications, past family history, past medical history, past social history, past surgical history and problem list.  Review of Systems Pertinent items are noted in HPI.   Objective:  BP 119/65    Pulse 81    Wt 183 lb (83 kg)    LMP 09/08/2020    BMI 32.42 kg/m  Gen: well appearing, NAD HEENT: no scleral icterus CV: RR Lung: Normal WOB Ext: warm well perfused   Assessment and Plan:  1. Miscarriage  - Reviewed results with patient. The precipitous drop in beta HCG is consistent with a failed pregnancy especially in the setting of heavy bleeding and cramping. I have communicated that she has likely passed the pregnancy and this pregnancy will not progress  - I reviewed a plan to check bhcg in 1 week to assure it returns to zero - Patient is incredibly tearful and talking about her 46 yo who witnessed the suicide of her 46 yo was excited to have sibling.  She is hoping "god is powerful enough to still save this baby"  - I provided support in the room and continued to provide clear messaging that this is a miscarriage. Patient decides to remain hopeful.  - Patient desired to call her pastor and I offered her the clinic room to have a private place. She also utilized this time to call her mother in [place].  - Patient was in the clinic room for almost 2 hours. I periodically checked on the patient. She opted to leave via the front entrance.  - Will cancel [place] and only repeat if bhcg is not zero.   2. Diabetes -- needs to decrease prandial but this was NOT addressed in the setting of emotional  distress  Used [employer] interpreter Darien Ramus [phone]  Please refer to After Visit Summary for other counseling recommendations.   Return in about 1 week (around 11/19/2020) for repeat bhcg .   Future Appointments  Date Time Provider Department Center  11/19/2020  9:50 AM [facility]  11/19/2020 10:15 AM [facility]  01/12/2021  9:50 AM Hoy Register, MD [facility] None

## 2020-11-12 NOTE — Patient Instructions (Signed)
Aborto espontneo Miscarriage El aborto espontneo es la prdida de un embarazo antes de la semana20. A veces, el embarazo termina antes de que la mujer sepa que est embarazada. Si pierde un embarazo, hable con su mdico sobre:  Las preguntas que tenga sobre la prdida del beb.  Cmo atravesar el duelo.  Planes para un futuro embarazo. Cules son las causas? Muchas veces, se desconoce la causa de esta afeccin. Qu incrementa el riesgo? Estas cosas pueden hacer que una embarazada sea ms propensa a perder un embarazo: Ciertos problemas mdicos  Trastornos que afectan las hormonas, como los siguientes: ? Enfermedad tiroidea. ? Sndrome del ovario poliqustico.  Diabetes.  Una enfermedad que provoca que el sistema del cuerpo que combate las enfermedades se ataque a s mismo por error.  Infecciones.  Problemas de sangrado.  Tener mucho sobrepeso. Factores de estilo de vida  Consumir productos que contienen nicotina o tabaco.  Estar cerca de humo de tabaco.  Beber alcohol.  Consumir mucha cafena.  Consumir drogas. Problemas relacionados con las partes o los rganos genitales  Sufrir la apertura y el borrado del cuello uterino antes de la fecha estimada de parto. El cuello uterino es la parte ms baja del tero.  Tener sndrome de [place], que deriva en: ? Cicatrices en el tero. ? Forma anormal del tero.  Tumores (fibromas) en el tero.  Problemas en el cuerpo que estn presentes desde el nacimiento.  Infeccin en el cuello uterino o el tero. Antecedentes personales o de salud  Lesiones.  Haber perdido un embarazo antes.  Ser menor de 18aos o mayor de 35aos de edad.  Estar cerca de una sustancia nociva, como la radiacin.  Que haya plomo u otros metales pesados en: ? Cosas que come o bebe. ? El aire que la rodea.  Tomar ciertos medicamentos. Cules son los signos o sntomas?  Sangre o manchas de sangre procedentes de la vagina. Tambin  puede tener clicos o dolor.  Dolor o clicos en la barriga o en la parte baja de la espalda.  Salida de lquido o tejido por la vagina. Cmo se trata? A veces, el tratamiento no es necesario. Si necesita tratamiento, es posible que se la trate con:  Un procedimiento para abrir ms el cuello uterino y sacar tejido del tero.  Medicamentos. Tal vez le den una inyeccin de un medicamento llamado inmunoglobulina Rho(D). Siga estas instrucciones en su casa: Medicamentos  Use los medicamentos de venta libre y los recetados solamente como se lo haya indicado el mdico.  Si le recetaron un antibitico, tmelo como se lo haya indicado el mdico. No deje de tomarlo aunque comience a sentirse mejor. Actividad  Haga reposo como se lo haya indicado el mdico. Pregntele al mdico qu actividades son seguras para usted.  Pida ayuda para realizar las tareas de la casa durante este tiempo. Instrucciones generales  Observe cunto tejido sale de la vagina.  Observe el tamao de cualquier cogulo de sangre que salga de la vagina.  No tenga relaciones sexuales ni se haga duchas vaginales hasta que el mdico la autorice.  No se ponga cosas, como tampones, en la vagina hasta que el mdico la autorice.  Para ayudarlos a usted y a su pareja con el duelo: ? Hable con el mdico. ? Vaya al psiclogo.  Cuando est lista, hable con el mdico sobre: ? Las cosas que puede hacer por su salud. ? Cmo puede estar sana si queda embarazada de nuevo.  Cumpla con todas las visitas   de seguimiento.   Dnde buscar ms informacin  The American College of Obstetricians and Gynecologists (Colegio Estadounidense de Obstetras y Gineclogos): acog.org  U.S. Department of Health and Human Services, Office on Women's Health (Departamento de Salud y Servicios Humanos de los EstadosUnidos, Oficina de Salud de la Mujer): hrsa.gov/office-womens-health Comunquese con un mdico si:  Tiene fiebre o escalofros.  Le  sale lquido con mal olor de la vagina.  Aumenta el sangrado.  Le salen cogulos de sangre o tejido de la vagina. Solicite ayuda de inmediato si:  Tiene espasmos o dolor muy intensos en el abdomen o en la espalda.  Llena ms de 2apsitos sanitarios grandes en una hora durante ms de 2horas.  Se siente dbil o mareada.  Se desmaya.  Se siente triste y tiene pensamientos tristes gran parte del tiempo.  Piensa en hacerse dao. Busque ayuda de inmediatosi alguna vez siente que puede hacerse dao a usted misma o a otros, o tiene pensamientos de poner fin a su vida. Dirjase al centro de urgencias ms cercano o:  Comunquese con el servicio de emergencias de su localidad (911 en los [place]).  Llame a National Suicide Prevention Lifeline (Lnea Telefnica Nacional para la Prevencin del Suicidio) al 1-800-273-8255. Est disponible las 24 horas del da.  Enve un mensaje de texto a la lnea para casos de crisis al 741741. Resumen  El aborto espontneo es la prdida de un embarazo antes de la semana20. A veces, el embarazo termina antes de que la mujer sepa que est embarazada.  Siga las instrucciones del mdico respecto de los medicamentos y la actividad.  Para que usted y su pareja puedan sobrellevar el duelo, hablen con el mdico o con un psiclogo.  Cumpla con todas las visitas de seguimiento. Esta informacin no tiene como fin reemplazar el consejo del mdico. Asegrese de hacerle al mdico cualquier pregunta que tenga. Document Revised: 04/08/2020 Document Reviewed: 04/08/2020 Elsevier Patient Education  2021 Elsevier Inc.  

## 2020-11-17 ENCOUNTER — Other Ambulatory Visit: Payer: Self-pay | Admitting: *Deleted

## 2020-11-17 DIAGNOSIS — O039 Complete or unspecified spontaneous abortion without complication: Secondary | ICD-10-CM

## 2020-11-19 ENCOUNTER — Other Ambulatory Visit: Payer: Self-pay

## 2020-11-19 ENCOUNTER — Ambulatory Visit: Payer: No Typology Code available for payment source | Admitting: Registered"

## 2020-11-19 ENCOUNTER — Encounter: Payer: No Typology Code available for payment source | Attending: Family Medicine | Admitting: Registered"

## 2020-11-19 ENCOUNTER — Other Ambulatory Visit: Payer: No Typology Code available for payment source

## 2020-11-19 DIAGNOSIS — E118 Type 2 diabetes mellitus with unspecified complications: Secondary | ICD-10-CM

## 2020-11-19 DIAGNOSIS — O039 Complete or unspecified spontaneous abortion without complication: Secondary | ICD-10-CM

## 2020-11-19 DIAGNOSIS — O24419 Gestational diabetes mellitus in pregnancy, unspecified control: Secondary | ICD-10-CM | POA: Insufficient documentation

## 2020-11-19 NOTE — Progress Notes (Signed)
Interpreter services provided by Vaughan Basta [phone] from [employer]  Patient was seen on 11/19/20 for follow-up assessment and education.   Patient thought she was only doing blood work today and did not bring her blood sugar log.  Patient states FBS is 168-200 mg/dL; 2 hrs post prandial 921-194 mg/dL.  Patient reports ~10 days ago she discontinued Humalog with meals on her own due to hypoglycemic symptoms.  Pt reports she has continued injecting Humulin N 11 units morning and evening.   Per Dr. Vergie Living patient is to increase Humulin N to 15 units bid and discontinue the Humalog. Patient to return next week for MD visit and encouraged patient to keep her appointment with [facility] scheduled for 01/12/21.  Plan:  Inject Humulin N 15 units bid discontinue the Humalog Return in 1 week for MD appointment Keep appointment at [facility] scheduled for 01/12/21.  Patient will be seen for follow-up as needed.

## 2020-11-20 ENCOUNTER — Other Ambulatory Visit: Payer: No Typology Code available for payment source

## 2020-11-20 LAB — BETA HCG QUANT (REF LAB): hCG Quant: 6 m[IU]/mL

## 2020-11-24 ENCOUNTER — Telehealth: Payer: Self-pay

## 2020-11-24 NOTE — Telephone Encounter (Addendum)
-----   Message from Federico Flake, MD sent at 11/22/2020  6:13 PM EST ----- Level is just above threshold and has dropped precipitously from over 2k to 264 and now 6 which is very consistent with miscarriage. I recommend a repeat in. 1 week to assure it drops to negative (<5).  Called pt with Spanish Interpreter Raquel M., and informed pt results and that we will f/u with and draw another pregnancy hormone level at her appt scheduled on 11/26/20.  Pt verbalized understanding with no further questions.   Leonette Nutting  11/24/20

## 2020-11-26 ENCOUNTER — Other Ambulatory Visit: Payer: Self-pay

## 2020-11-26 ENCOUNTER — Ambulatory Visit (INDEPENDENT_AMBULATORY_CARE_PROVIDER_SITE_OTHER): Payer: Self-pay | Admitting: Obstetrics and Gynecology

## 2020-11-26 ENCOUNTER — Encounter: Payer: Self-pay | Admitting: Obstetrics and Gynecology

## 2020-11-26 VITALS — BP 118/61 | HR 70 | Ht 62.0 in | Wt 181.0 lb

## 2020-11-26 DIAGNOSIS — Z789 Other specified health status: Secondary | ICD-10-CM

## 2020-11-26 DIAGNOSIS — O3680X0 Pregnancy with inconclusive fetal viability, not applicable or unspecified: Secondary | ICD-10-CM

## 2020-11-26 NOTE — Progress Notes (Signed)
Obstetrics and Gynecology Visit Return Patient Evaluation  Appointment Date: 11/26/2020  Primary Care Provider: Hoy Register  OBGYN Clinic: [facility]  Chief Complaint: follow up beta hcg  History of Present Illness:  Alexandra Aguirre is a 45 y.o. [id number] with above CC. Patient being followed for serial beta hcg  She had a 2/21 u/s that showed a 5wk sac with no YS or embryo and beta hcg last week was six.  No bleeding or pain for past few weeks. No sexual intercourse  Review of Systems:  as noted in the History of Present Illness.   Patient Active Problem List   Diagnosis Date Noted  . Pre-existing type 2 diabetes mellitus during pregnancy, antepartum 11/03/2020  . Pharyngitis 08/27/2020  . Bacterial vaginitis 08/27/2020  . Dysuria 08/26/2020  . Yeast infection 08/26/2020  . IUD contraception 11/09/2018  . Acute tonsillitis 11/01/2018  . Acute hemorrhagic otitis externa of both ears 11/01/2018  . Non-recurrent acute suppurative otitis media of left ear without spontaneous rupture of tympanic membrane 11/01/2018  . Acute non-recurrent maxillary sinusitis 11/01/2018  . POP-Q stage 2 cystocele 09/20/2018  . Fatty liver 06/14/2018  . Hyperlipidemia 04/04/2018  . Dental caries 06/21/2017  . Vitamin D deficiency 03/01/2017  . Type 2 diabetes mellitus with complication, without long-term current use of insulin (HCC) 10/12/2016  . Gastroesophageal reflux disease 10/12/2016  . Generalized anxiety disorder 10/12/2016  . Asthma 01/15/2014  . Vertigo 11/25/2013  . History of cervical dysplasia 11/01/2013  . Pedal edema 09/30/2013  . Migraine headache 09/30/2013  . Gastritis and gastroduodenitis 09/30/2013  . Back pain 09/30/2013   Medications:  [name] had no medications administered during this visit. Current Outpatient Medications  Medication Sig Dispense Refill  . acetaminophen (TYLENOL) 325 MG tablet Take 650 mg by mouth  every 6 (six) hours as needed.    . Alcohol Swabs PADS 1 packet by Does not apply route 5 (five) times daily. 100 each 11  . atorvastatin (LIPITOR) 40 MG tablet Take 1 tablet (40 mg total) by mouth daily. 30 tablet 6  . Blood Glucose Monitoring Suppl (TRUE METRIX AIR GLUCOSE METER) DEVI 1 each by Does not apply route 3 (three) times daily. 1 each 0  . glucose blood (TRUE METRIX BLOOD GLUCOSE TEST) test strip 1 each by Other route 3 (three) times daily. 100 each 12  . insulin lispro (HUMALOG) 100 UNIT/ML KwikPen Inject 15 Units into the skin 3 (three) times daily. With meals. Do not inject if you do not eat. 15 mL 11  . Insulin NPH, Human,, Isophane, (HUMULIN N) 100 UNIT/ML Kiwkpen Inject 11 Units into the skin in the morning and at bedtime. 15 mL 11  . Insulin Pen Needle (PEN NEEDLES 31GX5/16") 31G X 8 MM MISC 1 Device by Does not apply route 5 (five) times daily. 100 each 11  . metFORMIN (GLUCOPHAGE) 1000 MG tablet Take 1 tablet (1,000 mg total) by mouth 2 (two) times daily with a meal. Take orally 3 tabs (1500 mg) in the morning and 2 tabs (1000 mg) in the evening 60 tablet 11  . aspirin 81 MG chewable tablet Chew by mouth daily. (Patient not taking: No sig reported)     No current facility-administered medications for this visit.    Allergies: is allergic to fruit & vegetable daily [nutritional supplements].  Physical Exam:  BP 118/61   Pulse 70   Ht 5\' 2"  (1.575 m)   Wt 181 lb (82.1  kg)   LMP 09/08/2020   Breastfeeding Unknown Comment: SAB  BMI 33.11 kg/m  Body mass index is 33.11 kg/m. General appearance: Well nourished, well developed female in no acute distress.  Neuro/Psych:  Normal mood and affect.    Assessment: pt stable  Plan:  1. Pregnancy of unknown anatomic location Recommend rpt beta hcg, but I told her that she has had a miscarriage and this will likely show a completed one. She had qmonth, regular periods before and should expect them to resume. She would like to  get pregnant again. Risks d/w her particularly with her age and DM2 status. I told her the only thing she can do to help future pregnancies is to lower her CBGs and start a women's vitamin.  She has an appt with a PCP next month and this was confirmed with her: date, time and location.  - Beta hCG quant (ref lab)  Interpreter used   RTC: PRN  Cornelia Copa MD Attending Center for [facility])

## 2020-11-26 NOTE — Progress Notes (Signed)
Pt left glucose paper log at home

## 2020-11-27 ENCOUNTER — Telehealth: Payer: Self-pay | Admitting: Obstetrics and Gynecology

## 2020-11-27 LAB — BETA HCG QUANT (REF LAB): hCG Quant: <1 m[IU]/mL

## 2020-11-27 NOTE — Progress Notes (Signed)
Attestation of Attending Supervision of clinical support staff: I agree with the care provided to this patient and was available for any consultation.  I have reviewed the RN's note and chart. I was available for consult and to see the patient if needed.   Zamirah Denny Niles Anjelique Makar, MD, MPH, ABFM Attending Physician [facility]  

## 2020-11-27 NOTE — Telephone Encounter (Signed)
GYN Telephone Note  Patient called at [phone] and d/w her the negative beta hcg result from yesterday. Patient understanding and all questions answered.   Interpreter used  [place] Bing, Montez Hageman MD Attending Center for [employer] (Faculty Practice) 11/27/2020 Time: 1014am

## 2021-01-12 ENCOUNTER — Other Ambulatory Visit: Payer: Self-pay

## 2021-01-12 ENCOUNTER — Ambulatory Visit: Payer: Self-pay | Attending: Family Medicine | Admitting: Family Medicine

## 2021-01-12 ENCOUNTER — Encounter: Payer: Self-pay | Admitting: Family Medicine

## 2021-01-12 VITALS — BP 129/81 | HR 82 | Resp 18 | Ht 63.0 in | Wt 182.6 lb

## 2021-01-12 DIAGNOSIS — N926 Irregular menstruation, unspecified: Secondary | ICD-10-CM

## 2021-01-12 DIAGNOSIS — J302 Other seasonal allergic rhinitis: Secondary | ICD-10-CM

## 2021-01-12 DIAGNOSIS — E118 Type 2 diabetes mellitus with unspecified complications: Secondary | ICD-10-CM

## 2021-01-12 DIAGNOSIS — J4520 Mild intermittent asthma, uncomplicated: Secondary | ICD-10-CM

## 2021-01-12 DIAGNOSIS — E1142 Type 2 diabetes mellitus with diabetic polyneuropathy: Secondary | ICD-10-CM

## 2021-01-12 DIAGNOSIS — B373 Candidiasis of vulva and vagina: Secondary | ICD-10-CM

## 2021-01-12 DIAGNOSIS — B3731 Acute candidiasis of vulva and vagina: Secondary | ICD-10-CM

## 2021-01-12 LAB — POCT GLYCOSYLATED HEMOGLOBIN (HGB A1C): HbA1c, POC (controlled diabetic range): 9.1 % — AB (ref 0.0–7.0)

## 2021-01-12 LAB — GLUCOSE, POCT (MANUAL RESULT ENTRY): POC Glucose: 337 mg/dl — AB (ref 70–99)

## 2021-01-12 LAB — POCT URINE PREGNANCY: Preg Test, Ur: NEGATIVE

## 2021-01-12 MED ORDER — GABAPENTIN 300 MG PO CAPS
300.0000 mg | ORAL_CAPSULE | Freq: Every day | ORAL | 3 refills | Status: DC
  Filled 2021-01-12 – 2021-01-22 (×2): qty 30, 30d supply, fill #0

## 2021-01-12 MED ORDER — CLOTRIMAZOLE 1 % EX CREA
1.0000 "application " | TOPICAL_CREAM | Freq: Two times a day (BID) | CUTANEOUS | 0 refills | Status: DC
  Filled 2021-01-12: qty 30, 15d supply, fill #0

## 2021-01-12 MED ORDER — SITAGLIPTIN PHOSPHATE 100 MG PO TABS
ORAL_TABLET | Freq: Every day | ORAL | 6 refills | Status: DC
  Filled 2021-01-12: qty 30, 30d supply, fill #0
  Filled 2021-01-12: qty 90, 90d supply, fill #0
  Filled 2021-01-22: qty 30, 30d supply, fill #0

## 2021-01-12 MED ORDER — MOMETASONE FURO-FORMOTEROL FUM 100-5 MCG/ACT IN AERO
2.0000 | INHALATION_SPRAY | Freq: Two times a day (BID) | RESPIRATORY_TRACT | 6 refills | Status: DC
  Filled 2021-01-12 – 2021-01-22 (×2): qty 13, 30d supply, fill #0

## 2021-01-12 MED ORDER — CETIRIZINE HCL 10 MG PO TABS
10.0000 mg | ORAL_TABLET | Freq: Every day | ORAL | 1 refills | Status: DC
  Filled 2021-01-12 – 2021-01-22 (×2): qty 30, 30d supply, fill #0

## 2021-01-12 MED ORDER — PANTOPRAZOLE SODIUM 40 MG PO TBEC
DELAYED_RELEASE_TABLET | Freq: Every day | ORAL | 2 refills | Status: DC
  Filled 2021-01-12 – 2021-01-22 (×2): qty 30, 30d supply, fill #0

## 2021-01-12 MED ORDER — INSULIN ISOPHANE HUMAN 100 UNIT/ML KWIKPEN
PEN_INJECTOR | SUBCUTANEOUS | 11 refills | Status: DC
  Filled 2021-01-12: qty 6, 28d supply, fill #0
  Filled 2021-01-12: qty 9, 42d supply, fill #0
  Filled 2021-01-22: qty 6, 28d supply, fill #0

## 2021-01-12 NOTE — Progress Notes (Signed)
Pt also states having missed period thinks she may be pregnant. Pt informed to leave urine for pregnancy test.

## 2021-01-12 NOTE — Patient Instructions (Signed)
Infeccin mictica vaginal en los adultos Vaginal Yeast Infection, Adult  La infeccin mictica vaginal es una afeccin que causa secrecin vaginal y Garment/textile technologist, hinchazn y enrojecimiento (inflamacin) de la vagina. Esta es una afeccin frecuente. Algunas mujeres contraen esta infeccin con frecuencia. Cules son las causas? La causa de la infeccin es un cambio en el equilibrio normal de los hongos (cndida) y las bacterias que viven en la vagina. Esta alteracin deriva en el crecimiento excesivo de los hongos, lo que causa la inflamacin. Qu incrementa el riesgo? La afeccin es ms probable en las mujeres que tienen estas caractersticas:  Toman antibiticos.  Tienen diabetes.  Toman anticonceptivos orales.  Estn embarazadas.  Se hacen duchas vaginales con frecuencia.  Tienen debilitado el sistema de defensa del organismo (sistema inmunitario).  Han estado tomando medicamentos con corticoesteroides durante [employer].  Usan ropa ajustada con frecuencia. Cules son los signos o sntomas? Los sntomas de esta afeccin incluyen:  Secrecin vaginal blanca, cremosa y espesa.  Hinchazn, picazn, enrojecimiento e irritacin de la vagina. Los labios de la vagina (vulva) tambin se pueden infectar.  Dolor o ardor al Garment/textile technologist.  [place]. Cmo se diagnostica? Esta afeccin se diagnostica en funcin de lo siguiente:  Sus antecedentes mdicos.  Un examen fsico.  Un examen plvico. El mdico examinar una muestra de la secrecin vaginal con un microscopio. Probablemente el mdico enve esta muestra al laboratorio para analizarla y confirmar el diagnstico. Cmo se trata? Esta afeccin se trata con medicamentos. [place] [employer] pueden ser recetados o de venta libre. Podrn indicarle que use uno o ms de lo siguiente:  Medicamentos que se toman por boca (orales).  Medicamentos que se aplican como una crema (tpicos).  Medicamentos que se  colocan directamente en la vagina (vulos vaginales). Siga estas instrucciones en su casa: Estilo de vida  No tenga relaciones sexuales hasta que el mdico lo autorice. Comunique a su compaero sexual que tiene una infeccin por hongos. Esa persona debera visitar a su mdico y preguntarle si debera recibir tratamiento tambin.  No use ropa ajustada, como pantimedias o pantalones ajustados.  Use ropa interior de algodn, que permite el paso del aire. Instrucciones generales  Tome o aplquese los medicamentos de venta libre y los recetados solamente como se lo haya indicado el mdico.  Consuma ms yogur. Esto puede ayudar a Technical brewer de la infeccin mictica.  No use tampones hasta que el mdico la autorice.  Intente darse un bao de asiento para [employer]. Se trata de un bao de agua tibia que se toma mientras se est sentada. El agua solo debe Systems analyst las caderas y cubrir las nalgas. Hgalo 3o 4veces al da o como se lo haya indicado el mdico.  No se haga duchas vaginales.  Si tiene diabetes, mantenga bajo control los niveles de Dispensing optician.  Concurra a todas las visitas de seguimiento como se lo haya indicado el mdico. Esto es importante.   Comunquese con un mdico si:  Tiene fiebre.  Los sntomas desaparecen y luego vuelven a Arts administrator.  Los sntomas no mejoran con Dispensing optician.  Sus sntomas empeoran.  Aparecen nuevos sntomas.  Aparecen ampollas alrededor o adentro de la vagina.  Le sale sangre de la vagina y no est menstruando.  Siente dolor en el abdomen. Resumen  La infeccin mictica vaginal es una afeccin que causa secrecin y Garment/textile technologist, hinchazn y enrojecimiento (inflamacin) de la vagina.  Esta afeccin se trata con medicamentos.  Los medicamentos pueden ser recetados o de venta libre.  Tome o aplquese los medicamentos de venta libre y los recetados solamente como se lo haya indicado el mdico.  No se haga  duchas vaginales. No tenga relaciones sexuales ni use tampones hasta que el mdico la autorice.  Comunquese con un mdico si los sntomas no mejoran con el tratamiento o si los sntomas desaparecen y luego regresan. Esta informacin no tiene como fin reemplazar el consejo del mdico. Asegrese de hacerle al mdico cualquier pregunta que tenga. Document Revised: 05/21/2019 Document Reviewed: 05/21/2019 Elsevier Patient Education  2021 Elsevier Inc.  

## 2021-01-12 NOTE — Progress Notes (Signed)
Subjective:  Patient ID: Alexandra Aguirre, female    DOB: [birth year]  Age: 45 y.o. MRN: [id number]  CC: Diabetes   HPI Alexandra Bouchardorilda Leal Aguirre  is a 45 year old female with history of type 2 diabetes mellitus (A1c9.1), hyperlipidemia, GERD, gastritis, asthma, bereaved of her son in the spring of 2021seen for follow-up visit today. She suffered a miscarriage one month ago after 3 months of pregnancy. She has not had a period since then and would like to have a pregnancy test Undergoing Psychotherapy for the loss of her son but her recent miscarriage also has her a bit down.   Her arms have been going numb, worse at night with associated pain. Her arm feels like she has lumps in them.  She has her son massage her arms for relief. Her diabetes is uncontrolled and she has not been taking her insulins as prescribed. Complains of vaginal itching but no discharge. Asthma has been controlled but the since onset of the pollen she has had some sneezing and rhinorrhea. Past Medical History:  Diagnosis Date  . Abnormal Pap smear of cervix 09/24/2013   AGUS  . Acute non-recurrent maxillary sinusitis 11/01/2018  . Acute tonsillitis 11/01/2018  . Asthma   . Diabetes mellitus without complication (HCC)   . Environmental allergies   . GERD (gastroesophageal reflux disease)   . Non-recurrent acute suppurative otitis media of left ear without spontaneous rupture of tympanic membrane 11/01/2018    Past Surgical History:  Procedure Laterality Date  . CERVICAL BIOPSY  W/ LOOP ELECTRODE EXCISION    . removal of cyst Right 2011    Family History  Problem Relation Age of Onset  . Cancer Mother        cervical/ovarian  . Diabetes Mother   . Diabetes Maternal Uncle   . Diabetes Brother   . Diabetes Maternal Uncle   . Diabetes Maternal Uncle     Allergies  Allergen Reactions  . Fruit & Vegetable Daily [Nutritional Supplements] Other (See Comments)    "mouth rash" from Avocado, apple, peaches, plums,  kiwi    Outpatient Medications Prior to Visit  Medication Sig Dispense Refill  . atorvastatin (LIPITOR) 40 MG tablet Take 1 tablet (40 mg total) by mouth daily. 30 tablet 6  . glipiZIDE (GLUCOTROL) 10 MG tablet TAKE 1 TABLET (10 MG TOTAL) BY MOUTH 2 (TWO) TIMES DAILY BEFORE A MEAL. 60 tablet 6  . acetaminophen (TYLENOL) 325 MG tablet Take 650 mg by mouth every 6 (six) hours as needed.    . Alcohol Swabs PADS 1 packet by Does not apply route 5 (five) times daily. (Patient not taking: Reported on 01/12/2021) 100 each 11  . aspirin 81 MG chewable tablet Chew by mouth daily. (Patient not taking: No sig reported)    . atorvastatin (LIPITOR) 20 MG tablet TAKE 1 TABLET (20 MG TOTAL) BY MOUTH DAILY. (Patient not taking: Reported on 01/12/2021) 30 tablet 6  . atorvastatin (LIPITOR) 40 MG tablet TAKE 1 TABLET (40 MG TOTAL) BY MOUTH DAILY. (Patient not taking: Reported on 01/12/2021) 30 tablet 6  . Blood Glucose Monitoring Suppl (TRUE METRIX AIR GLUCOSE METER) DEVI 1 each by Does not apply route 3 (three) times daily. (Patient not taking: Reported on 01/12/2021) 1 each 0  . cyclobenzaprine (FLEXERIL) 10 MG tablet TAKE 1 TABLET (10 MG TOTAL) BY MOUTH 2 (TWO) TIMES DAILY AS NEEDED FOR MUSCLE SPASMS. (Patient not taking: Reported on 01/12/2021) 60 tablet 0  . cyclobenzaprine (FLEXERIL) 10 MG tablet TAKE  1 TABLET (10 MG TOTAL) BY MOUTH 2 (TWO) TIMES DAILY AS NEEDED FOR MUSCLE SPASMS. (Patient not taking: Reported on 01/12/2021) 20 tablet 0  . glucose blood (TRUE METRIX BLOOD GLUCOSE TEST) test strip 1 each by Other route 3 (three) times daily. (Patient not taking: Reported on 01/12/2021) 100 each 12  . insulin lispro (HUMALOG) 100 UNIT/ML KwikPen INJECT 15 UNITS INTO THE SKIN 3 (THREE) TIMES DAILY. WITH MEALS. DO NOT INJECT IF YOU DO NOT EAT. 15 mL 11  . Insulin NPH, Human,, Isophane, (HUMULIN N) 100 UNIT/ML Kiwkpen Inject 11 Units into the skin in the morning and at bedtime. 15 mL 11  . Insulin NPH, Human,, Isophane,  (HUMULIN N) 100 UNIT/ML Kiwkpen INJECT 11 UNITS INTO THE SKIN IN THE MORNING AND AT BEDTIME. 15 mL 11  . Insulin Pen Needle (PEN NEEDLES 31GX5/16") 31G X 8 MM MISC 1 Device by Does not apply route 5 (five) times daily. 100 each 11  . Insulin Pen Needle 31G X 8 MM MISC USE AS DIRECTED 5 TIMES DAILY 100 each 11  . metFORMIN (GLUCOPHAGE) 1000 MG tablet Take 1 tablet (1,000 mg total) by mouth 2 (two) times daily with a meal. Take orally 3 tabs (1500 mg) in the morning and 2 tabs (1000 mg) in the evening 60 tablet 11  . metFORMIN (GLUCOPHAGE) 1000 MG tablet TAKE 1 TABLET BY MOUTH 2 TIMES A DAY WITH MEALS 60 tablet 11  . metFORMIN (GLUCOPHAGE) 500 MG tablet TAKE 3 TABLETS BY MOUTH IN THE MORNING AND 2 TABS IN THE EVENING 150 tablet 6  . metFORMIN (GLUCOPHAGE) 500 MG tablet TAKE ORALLY 3 TABS (1500 MG) IN THE MORNING AND 2 TABS (1000 MG) IN THE EVENING 150 tablet 6  . metroNIDAZOLE (FLAGYL) 500 MG tablet TAKE 1 TABLET (500 MG TOTAL) BY MOUTH 2 (TWO) TIMES DAILY FOR 7 DAYS. 14 tablet 0  . fluconazole (DIFLUCAN) 150 MG tablet TAKE 1 TABLET BY MOUTH TODAY AND THEN TAKE 1 TABLET WHEN YOU COMPLETE AUGMETIN (Patient not taking: Reported on 01/12/2021) 2 tablet 0  . Insulin Glargine (BASAGLAR KWIKPEN) 100 UNIT/ML INJECT 10 UNITS INTO THE SKIN AT BEDTIME. (Patient not taking: Reported on 01/12/2021) 15 mL 6  . insulin lispro (HUMALOG) 100 UNIT/ML KwikPen Inject 15 Units into the skin 3 (three) times daily. With meals. Do not inject if you do not eat. 15 mL 11  . Insulin NPH, Human,, Isophane, (HUMULIN N) 100 UNIT/ML Kiwkpen INJECT 7 UNITS IN THE MORNING & 14 UNITS AT NIGHT 15 mL 11  . mometasone-formoterol (DULERA) 100-5 MCG/ACT AERO INHALE 2 PUFFS INTO THE LUNGS 2 (TWO) TIMES DAILY. 13 g 6  . pantoprazole (PROTONIX) 40 MG tablet TAKE 1 TABLET (40 MG TOTAL) BY MOUTH DAILY. 30 tablet 2  . sitaGLIPtin (JANUVIA) 100 MG tablet TAKE 1 TABLET (100 MG TOTAL) BY MOUTH DAILY. 30 tablet 6   No facility-administered medications  prior to visit.     ROS Review of Systems  Constitutional: Negative for activity change, appetite change and fatigue.  HENT: Negative for congestion, sinus pressure and sore throat.   Eyes: Negative for visual disturbance.  Respiratory: Negative for cough, chest tightness, shortness of breath and wheezing.   Cardiovascular: Negative for chest pain and palpitations.  Gastrointestinal: Negative for abdominal distention, abdominal pain and constipation.  Endocrine: Negative for polydipsia.  Genitourinary: Negative for dysuria and frequency.  Musculoskeletal:       See HPI  Skin: Negative for rash.  Neurological: Positive for numbness.  Negative for tremors and light-headedness.  Hematological: Does not bruise/bleed easily.  Psychiatric/Behavioral: Negative for agitation and behavioral problems.    Objective:  BP 129/81   Pulse 82   Resp 18   Ht 5\' 3"  (1.6 m)   Wt 182 lb 9.6 oz (82.8 kg)   SpO2 95%   BMI 32.35 kg/m   BP/Weight 01/12/2021 11/26/2020 11/12/2020  Systolic BP 129 118 119  Diastolic BP 81 61 65  Wt. (Lbs) 182.6 181 183  BMI 32.35 33.11 32.42      Physical Exam Constitutional:      Appearance: She is well-developed.  Neck:     Vascular: No JVD.  Cardiovascular:     Rate and Rhythm: Normal rate.     Heart sounds: Normal heart sounds. No murmur heard.   Pulmonary:     Effort: Pulmonary effort is normal.     Breath sounds: Normal breath sounds. No wheezing or rales.  Chest:     Chest wall: No tenderness.  Abdominal:     General: Bowel sounds are normal. There is no distension.     Palpations: Abdomen is soft. There is no mass.     Tenderness: There is no abdominal tenderness.  Musculoskeletal:        General: No tenderness. Normal range of motion.     Right lower leg: No edema.     Left lower leg: No edema.     Comments: Normal appearance of bilateral upper extremities with no palpable masses or lesions Normal handgrip bilaterally Normal strength  bilaterally  Neurological:     Mental Status: She is alert and oriented to person, place, and time.  Psychiatric:        Mood and Affect: Mood normal.     CMP Latest Ref Rng & Units 06/15/2020 11/21/2019 11/07/2019  Glucose 65 - 99 mg/dL 11/09/2019) 387(F) 643(P)  BUN 6 - 20 mg/dL - 8 10  Creatinine 295(J - 1.00 mg/dL - 8.84 1.66  Sodium 0.63 - 145 mmol/L - 134(L) 134(L)  Potassium 3.5 - 5.1 mmol/L - 3.7 4.0  Chloride 98 - 111 mmol/L - 100 103  CO2 22 - 32 mmol/L - 24 23  Calcium 8.9 - 10.3 mg/dL - 9.4 016)  Total Protein 6.5 - 8.1 g/dL - 7.4 -  Total Bilirubin 0.3 - 1.2 mg/dL - 0.3 -  Alkaline Phos 38 - 126 U/L - 117 -  AST 15 - 41 U/L - 21 -  ALT 0 - 44 U/L - 24 -    Lipid Panel     Component Value Date/Time   CHOL 184 10/13/2020 0930   TRIG 181 (H) 10/13/2020 0930   HDL 35 (L) 10/13/2020 0930   CHOLHDL 5.3 (H) 10/13/2020 0930   CHOLHDL 6.2 03/09/2018 1147   VLDL 38 03/09/2018 1147   LDLCALC 117 (H) 10/13/2020 0930    CBC    Component Value Date/Time   WBC 8.5 11/09/2020 1500   RBC 4.46 11/09/2020 1500   HGB 13.1 11/09/2020 1500   HGB 13.1 03/01/2017 1129   HCT 39.3 11/09/2020 1500   HCT 41.4 03/01/2017 1129   PLT 333 11/09/2020 1500   PLT 372 03/01/2017 1129   MCV 88.1 11/09/2020 1500   MCV 89 03/01/2017 1129   MCH 29.4 11/09/2020 1500   MCHC 33.3 11/09/2020 1500   RDW 12.9 11/09/2020 1500   RDW 13.5 03/01/2017 1129   LYMPHSABS 3.0 07/31/2019 2107   LYMPHSABS 1.8 03/01/2017 1129   MONOABS 0.7  07/31/2019 2107   EOSABS 0.3 07/31/2019 2107   EOSABS 0.3 03/01/2017 1129   BASOSABS 0.1 07/31/2019 2107   BASOSABS 0.0 03/01/2017 1129    Lab Results  Component Value Date   HGBA1C 9.1 (A) 01/12/2021    Assessment & Plan:  1. Type 2 diabetes mellitus with complication, without long-term current use of insulin (HCC) Uncontrolled with A1c of 9.1; goal is less than 7.0 Advised to restart insulin which I refilled Counseled on Diabetic diet, my plate method, 161  minutes of moderate intensity exercise/week Blood sugar logs with fasting goals of 80-120 mg/dl, random of less than 096 and in the event of sugars less than 60 mg/dl or greater than 045 mg/dl encouraged to notify the clinic. Advised on the need for annual eye exams, annual foot exams, Pneumonia vaccine. - POCT glucose (manual entry) - POCT glycosylated hemoglobin (Hb A1C) - Insulin NPH, Human,, Isophane, (HUMULIN N) 100 UNIT/ML Kiwkpen; INJECT 7 UNITS IN THE MORNING & 14 UNITS AT NIGHT  Dispense: 15 mL; Refill: 11 - sitaGLIPtin (JANUVIA) 100 MG tablet; TAKE 1 TABLET (100 MG TOTAL) BY MOUTH DAILY.  Dispense: 30 tablet; Refill: 6  2. Missed period Pregnancy test is negative There may be a delay in return to normal menstrual cycles due to her recent miscarriage. - POCT urine pregnancy  3. Diabetic polyneuropathy associated with type 2 diabetes mellitus (HCC) Could explain symptoms in her arms Initiate gabapentin - gabapentin (NEURONTIN) 300 MG capsule; Take 1 capsule (300 mg total) by mouth at bedtime.  Dispense: 30 capsule; Refill: 3  4. Vaginal candidiasis Hyperglycemia likely contributory - clotrimazole (LOTRIMIN) 1 % cream; Apply 1 application topically 2 (two) times daily.  Dispense: 30 g; Refill: 0  5. Mild intermittent asthma without complication Stable - mometasone-formoterol (DULERA) 100-5 MCG/ACT AERO; INHALE 2 PUFFS INTO THE LUNGS 2 (TWO) TIMES DAILY.  Dispense: 13 g; Refill: 6  6. Seasonal allergies Uncontrolled Initiate antihistamine - cetirizine (ZYRTEC) 10 MG tablet; Take 1 tablet (10 mg total) by mouth daily.  Dispense: 30 tablet; Refill: 1   Meds ordered this encounter  Medications  . Insulin NPH, Human,, Isophane, (HUMULIN N) 100 UNIT/ML Kiwkpen    Sig: INJECT 7 UNITS IN THE MORNING & 14 UNITS AT NIGHT    Dispense:  15 mL    Refill:  11  . gabapentin (NEURONTIN) 300 MG capsule    Sig: Take 1 capsule (300 mg total) by mouth at bedtime.    Dispense:  30 capsule     Refill:  3  . clotrimazole (LOTRIMIN) 1 % cream    Sig: Apply 1 application topically 2 (two) times daily.    Dispense:  30 g    Refill:  0  . sitaGLIPtin (JANUVIA) 100 MG tablet    Sig: TAKE 1 TABLET (100 MG TOTAL) BY MOUTH DAILY.    Dispense:  30 tablet    Refill:  6  . pantoprazole (PROTONIX) 40 MG tablet    Sig: TAKE 1 TABLET (40 MG TOTAL) BY MOUTH DAILY.    Dispense:  30 tablet    Refill:  2  . mometasone-formoterol (DULERA) 100-5 MCG/ACT AERO    Sig: INHALE 2 PUFFS INTO THE LUNGS 2 (TWO) TIMES DAILY.    Dispense:  13 g    Refill:  6  . cetirizine (ZYRTEC) 10 MG tablet    Sig: Take 1 tablet (10 mg total) by mouth daily.    Dispense:  30 tablet    Refill:  1  Follow-up: Return in about 3 months (around 04/13/2021) for Chronic disease management.       Hoy Register, MD, FAAFP. [facility] [place], [place] [phone]   01/12/2021, 1:16 PM

## 2021-01-19 ENCOUNTER — Other Ambulatory Visit: Payer: Self-pay

## 2021-01-22 ENCOUNTER — Other Ambulatory Visit: Payer: Self-pay

## 2021-01-29 ENCOUNTER — Other Ambulatory Visit: Payer: Self-pay

## 2021-02-06 ENCOUNTER — Ambulatory Visit (HOSPITAL_COMMUNITY)
Admission: EM | Admit: 2021-02-06 | Discharge: 2021-02-06 | Disposition: A | Discharge status: Home / Self Care | Payer: No Typology Code available for payment source

## 2021-02-06 ENCOUNTER — Other Ambulatory Visit: Payer: Self-pay

## 2021-02-06 ENCOUNTER — Encounter (HOSPITAL_COMMUNITY): Payer: Self-pay

## 2021-02-06 DIAGNOSIS — N912 Amenorrhea, unspecified: Secondary | ICD-10-CM

## 2021-02-06 DIAGNOSIS — Z23 Encounter for immunization: Secondary | ICD-10-CM

## 2021-02-06 DIAGNOSIS — E7800 Pure hypercholesterolemia, unspecified: Secondary | ICD-10-CM

## 2021-02-06 DIAGNOSIS — T2122XA Burn of second degree of abdominal wall, initial encounter: Secondary | ICD-10-CM

## 2021-02-06 DIAGNOSIS — L255 Unspecified contact dermatitis due to plants, except food: Secondary | ICD-10-CM

## 2021-02-06 DIAGNOSIS — S91332A Puncture wound without foreign body, left foot, initial encounter: Secondary | ICD-10-CM

## 2021-02-06 LAB — POC URINE PREG, ED: Preg Test, Ur: NEGATIVE

## 2021-02-06 MED ORDER — CYPROHEPTADINE HCL 4 MG PO TABS: 4.0000 mg | ORAL_TABLET | Freq: Three times a day (TID) | ORAL | 0 refills | Status: DC | PRN

## 2021-02-06 MED ORDER — CYPROHEPTADINE HCL 4 MG PO TABS
4.0000 mg | ORAL_TABLET | Freq: Three times a day (TID) | ORAL | 0 refills | Status: DC | PRN
  Filled 2021-02-06: qty 30, 10d supply, fill #0

## 2021-02-06 MED ORDER — METHYLPREDNISOLONE 4 MG PO TBPK: ORAL_TABLET | ORAL | 0 refills | Status: DC

## 2021-02-06 MED ORDER — MUPIROCIN 2 % EX OINT: 1.0000 "application " | TOPICAL_OINTMENT | Freq: Two times a day (BID) | CUTANEOUS | 0 refills | Status: DC

## 2021-02-06 MED ORDER — TETANUS-DIPHTH-ACELL PERTUSSIS 5-2.5-18.5 LF-MCG/0.5 IM SUSY
0.5000 mL | PREFILLED_SYRINGE | Freq: Once | INTRAMUSCULAR | Status: AC
  Administered 2021-02-06: 0.5 mL via INTRAMUSCULAR

## 2021-02-06 MED ORDER — METHYLPREDNISOLONE 4 MG PO TBPK
ORAL_TABLET | ORAL | 0 refills | Status: DC
  Filled 2021-02-06: qty 21, fill #0

## 2021-02-06 MED ORDER — TETANUS-DIPHTH-ACELL PERTUSSIS 5-2.5-18.5 LF-MCG/0.5 IM SUSY
PREFILLED_SYRINGE | INTRAMUSCULAR | Status: AC
  Filled 2021-02-06: qty 0.5

## 2021-02-06 MED ORDER — SILVER SULFADIAZINE 1 % EX CREA: 1.0000 "application " | TOPICAL_CREAM | Freq: Two times a day (BID) | CUTANEOUS | 0 refills | Status: DC

## 2021-02-06 MED ORDER — DIPHENHYDRAMINE HCL 25 MG PO CAPS
50.0000 mg | ORAL_CAPSULE | Freq: Once | ORAL | Status: AC
  Administered 2021-02-06: 50 mg via ORAL

## 2021-02-06 MED ORDER — DIPHENHYDRAMINE HCL 25 MG PO CAPS
ORAL_CAPSULE | ORAL | Status: AC
  Filled 2021-02-06: qty 2

## 2021-02-06 NOTE — Discharge Instructions (Addendum)
  Lave su herida con agua y Alfonso Patten sus ronchas con agua y bicarbonato para [name].   Compre TECNU locion para lavar su piel   Cheque su azucar ya que tas pastillas de cortisona le hara subir.
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
## 2021-02-08 ENCOUNTER — Other Ambulatory Visit: Payer: Self-pay

## 2021-03-08 ENCOUNTER — Encounter (HOSPITAL_COMMUNITY): Payer: Self-pay

## 2021-03-08 ENCOUNTER — Ambulatory Visit (HOSPITAL_COMMUNITY)
Admission: EM | Admit: 2021-03-08 | Discharge: 2021-03-08 | Disposition: A | Discharge status: Home / Self Care | Payer: No Typology Code available for payment source

## 2021-03-08 ENCOUNTER — Other Ambulatory Visit: Payer: Self-pay

## 2021-03-08 DIAGNOSIS — S61011A Laceration without foreign body of right thumb without damage to nail, initial encounter: Secondary | ICD-10-CM

## 2021-03-08 DIAGNOSIS — M79644 Pain in right finger(s): Secondary | ICD-10-CM

## 2021-03-08 MED ORDER — LIDOCAINE HCL 2 % IJ SOLN
INTRAMUSCULAR | Status: AC
  Filled 2021-03-08: qty 20

## 2021-03-08 NOTE — Discharge Instructions (Addendum)
Tome 500mg -650mg  de Tylenol cada 6 horas para dolor, fiebre. Tambien puede usar ibuprofen 400mg -600mg  cada 6 horas para dolor y inflamacion.
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
## 2021-03-08 NOTE — ED Provider Notes (Signed)
[facility]   MRN: [id number] DOB: 05-02-1976  Subjective:   Alexandra Aguirre is a 45 y.o. female presenting for suffering a laceration to her right base of the thumb. Reports that she mishandled a [employer] blender blade. Tdap is up to date.  Denies numbness or tingling, burning sensation.  No loss of range of motion.  No current facility-administered medications for this encounter.  Current Outpatient Medications:    acetaminophen (TYLENOL) 325 MG tablet, Take 650 mg by mouth every 6 (six) hours as needed., Disp: , Rfl:    atorvastatin (LIPITOR) 40 MG tablet, TAKE 1 TABLET (40 MG TOTAL) BY MOUTH DAILY. (Patient not taking: Reported on 01/12/2021), Disp: 30 tablet, Rfl: 6   cetirizine (ZYRTEC) 10 MG tablet, Take 1 tablet (10 mg total) by mouth daily., Disp: 30 tablet, Rfl: 1   clotrimazole (LOTRIMIN) 1 % cream, Apply 1 application topically 2 (two) times daily., Disp: 30 g, Rfl: 0   cyproheptadine (PERIACTIN) 4 MG tablet, Take 1 tablet (4 mg total) by mouth 3 (three) times daily as needed for allergies., Disp: 30 tablet, Rfl: 0   gabapentin (NEURONTIN) 300 MG capsule, Take 1 capsule (300 mg total) by mouth at bedtime., Disp: 30 capsule, Rfl: 3   glipiZIDE (GLUCOTROL) 10 MG tablet, TAKE 1 TABLET (10 MG TOTAL) BY MOUTH 2 (TWO) TIMES DAILY BEFORE A MEAL., Disp: 60 tablet, Rfl: 6   insulin lispro (HUMALOG) 100 UNIT/ML KwikPen, INJECT 15 UNITS INTO THE SKIN 3 (THREE) TIMES DAILY. WITH MEALS. DO NOT INJECT IF YOU DO NOT EAT., Disp: 15 mL, Rfl: 11   Insulin NPH, Human,, Isophane, (HUMULIN N) 100 UNIT/ML Kiwkpen, Inject 11 Units into the skin in the morning and at bedtime., Disp: 15 mL, Rfl: 11   Insulin NPH, Human,, Isophane, (HUMULIN N) 100 UNIT/ML Kiwkpen, INJECT 7 UNITS IN THE MORNING & 14 UNITS AT NIGHT, Disp: 15 mL, Rfl: 11   Insulin NPH, Human,, Isophane, (HUMULIN N) 100 UNIT/ML Kiwkpen, INJECT 11 UNITS INTO THE SKIN IN THE MORNING AND AT BEDTIME., Disp: 15 mL, Rfl: 11    Insulin Pen Needle (PEN NEEDLES 31GX5/16") 31G X 8 MM MISC, 1 Device by Does not apply route 5 (five) times daily., Disp: 100 each, Rfl: 11   Insulin Pen Needle 31G X 8 MM MISC, USE AS DIRECTED 5 TIMES DAILY, Disp: 100 each, Rfl: 11   metFORMIN (GLUCOPHAGE) 1000 MG tablet, Take 1 tablet (1,000 mg total) by mouth 2 (two) times daily with a meal. Take orally 3 tabs (1500 mg) in the morning and 2 tabs (1000 mg) in the evening, Disp: 60 tablet, Rfl: 11   metFORMIN (GLUCOPHAGE) 1000 MG tablet, TAKE 1 TABLET BY MOUTH 2 TIMES A DAY WITH MEALS, Disp: 60 tablet, Rfl: 11   metFORMIN (GLUCOPHAGE) 500 MG tablet, TAKE 3 TABLETS BY MOUTH IN THE MORNING AND 2 TABS IN THE EVENING, Disp: 150 tablet, Rfl: 6   metFORMIN (GLUCOPHAGE) 500 MG tablet, TAKE ORALLY 3 TABS (1500 MG) IN THE MORNING AND 2 TABS (1000 MG) IN THE EVENING, Disp: 150 tablet, Rfl: 6   methylPREDNISolone (MEDROL DOSEPAK) 4 MG TBPK tablet, Take as directed, Disp: 21 tablet, Rfl: 0   mometasone-formoterol (DULERA) 100-5 MCG/ACT AERO, INHALE 2 PUFFS INTO THE LUNGS 2 (TWO) TIMES DAILY., Disp: 13 g, Rfl: 6   mupirocin ointment (BACTROBAN) 2 %, Apply 1 application topically 2 (two) times daily., Disp: 30 g, Rfl: 0   omeprazole (PRILOSEC) 10 MG capsule, Take 10  mg by mouth daily., Disp: , Rfl:    pantoprazole (PROTONIX) 40 MG tablet, TAKE 1 TABLET (40 MG TOTAL) BY MOUTH DAILY., Disp: 30 tablet, Rfl: 2   silver sulfADIAZINE (SILVADENE) 1 % cream, Apply 1 application topically 2 (two) times daily. For 7 days, Disp: 50 g, Rfl: 0   sitaGLIPtin (JANUVIA) 100 MG tablet, TAKE 1 TABLET (100 MG TOTAL) BY MOUTH DAILY., Disp: 30 tablet, Rfl: 6   Allergies  Allergen Reactions   Fruit & Vegetable Daily [Nutritional Supplements] Other (See Comments)    "mouth rash" from Avocado, apple, peaches, plums, kiwi    Past Medical History:  Diagnosis Date   Abnormal Pap smear of cervix 09/24/2013   AGUS   Acute non-recurrent maxillary sinusitis 11/01/2018   Acute tonsillitis  11/01/2018   Asthma    Diabetes mellitus without complication (HCC)    Environmental allergies    GERD (gastroesophageal reflux disease)    Non-recurrent acute suppurative otitis media of left ear without spontaneous rupture of tympanic membrane 11/01/2018     Past Surgical History:  Procedure Laterality Date   CERVICAL BIOPSY  W/ LOOP ELECTRODE EXCISION     removal of cyst Right 2011    Family History  Problem Relation Age of Onset   Cancer Mother        cervical/ovarian   Diabetes Mother    Diabetes Maternal Uncle    Diabetes Brother    Diabetes Maternal Uncle    Diabetes Maternal Uncle     Social History   Tobacco Use   Smoking status: Never   Smokeless tobacco: Never  Vaping Use   Vaping Use: Never used  Substance Use Topics   Alcohol use: No   Drug use: No    ROS   Objective:   Vitals: BP 108/74   Pulse 76   Temp 98 F (36.7 C)   Resp 19   LMP 02/05/2021 (Approximate)   SpO2 100%   Physical Exam Constitutional:      General: She is not in acute distress.    Appearance: Normal appearance. She is well-developed. She is not ill-appearing, toxic-appearing or diaphoretic.  HENT:     Head: Normocephalic and atraumatic.     Nose: Nose normal.     Mouth/Throat:     Mouth: Mucous membranes are moist.     Pharynx: Oropharynx is clear.  Eyes:     General: No scleral icterus.       Right eye: No discharge.        Left eye: No discharge.     Extraocular Movements: Extraocular movements intact.     Conjunctiva/sclera: Conjunctivae normal.     Pupils: Pupils are equal, round, and reactive to light.  Cardiovascular:     Rate and Rhythm: Normal rate.  Pulmonary:     Effort: Pulmonary effort is normal.  Musculoskeletal:       Hands:  Skin:    General: Skin is warm and dry.  Neurological:     General: No focal deficit present.     Mental Status: She is alert and oriented to person, place, and time.  Psychiatric:        Mood and Affect: Mood normal.         Behavior: Behavior normal.        Thought Content: Thought content normal.        Judgment: Judgment normal.    PROCEDURE NOTE: laceration repair Verbal consent obtained from patient.  Local anesthesia with 3cc Lidocaine  2% without epinephrine.  Wound explored for tendon, ligament damage. Wound scrubbed with soap and water and rinsed. Wound closed with #4 5-0 Prolene (simple interrupted) sutures.  Wound cleansed and dressed.   Assessment and Plan :   PDMP not reviewed this encounter.  1. Pain of right thumb   2. Laceration of right thumb without foreign body without damage to nail, initial encounter     Laceration repaired successfully. Wound care reviewed. Recommended Tylenol and/or ibuprofen for pain control. Return-to-clinic precautions discussed, patient verbalized understanding. Otherwise, follow up in 10 days for suture removal.     Wallis Bamberg, PA-C 03/08/21 1812

## 2021-03-18 ENCOUNTER — Other Ambulatory Visit: Payer: Self-pay

## 2021-03-18 ENCOUNTER — Ambulatory Visit (HOSPITAL_COMMUNITY)
Admission: EM | Admit: 2021-03-18 | Discharge: 2021-03-18 | Disposition: A | Discharge status: Home / Self Care | Payer: No Typology Code available for payment source

## 2021-03-18 ENCOUNTER — Encounter (HOSPITAL_COMMUNITY): Payer: Self-pay

## 2021-03-18 DIAGNOSIS — Z4802 Encounter for removal of sutures: Secondary | ICD-10-CM

## 2021-03-18 NOTE — ED Triage Notes (Addendum)
Patient had sutures placed 03/08/21 and is here for suture removal. Site clean, dry, intact. PA agrees sutures are okay to be removed. Procedure was explained to patient.  Four sutures removed, site clean, dry, intact, patient handled well.

## 2021-03-18 NOTE — Discharge Instructions (Signed)
Follow up as needed

## 2021-04-14 ENCOUNTER — Telehealth: Payer: Self-pay | Admitting: Family Medicine

## 2021-04-14 NOTE — Telephone Encounter (Signed)
Called pt with interpreter was unable to leave VM it was full called because provider will be virtual and will call pt by phone

## 2021-04-15 ENCOUNTER — Ambulatory Visit: Payer: Self-pay | Attending: Family Medicine | Admitting: Family Medicine

## 2021-04-15 ENCOUNTER — Other Ambulatory Visit: Payer: Self-pay

## 2021-04-15 ENCOUNTER — Encounter: Payer: Self-pay | Admitting: Family Medicine

## 2021-04-15 ENCOUNTER — Encounter (INDEPENDENT_AMBULATORY_CARE_PROVIDER_SITE_OTHER): Payer: Self-pay

## 2021-04-15 DIAGNOSIS — R42 Dizziness and giddiness: Secondary | ICD-10-CM

## 2021-04-15 DIAGNOSIS — E118 Type 2 diabetes mellitus with unspecified complications: Secondary | ICD-10-CM

## 2021-04-15 DIAGNOSIS — N926 Irregular menstruation, unspecified: Secondary | ICD-10-CM

## 2021-04-15 DIAGNOSIS — H11001 Unspecified pterygium of right eye: Secondary | ICD-10-CM

## 2021-04-15 DIAGNOSIS — K130 Diseases of lips: Secondary | ICD-10-CM

## 2021-04-15 LAB — POCT URINE PREGNANCY: Preg Test, Ur: NEGATIVE

## 2021-04-15 MED ORDER — HYDROCORTISONE 0.5 % EX CREA
1.0000 "application " | TOPICAL_CREAM | Freq: Two times a day (BID) | CUTANEOUS | 0 refills | Status: DC
  Filled 2021-04-15: qty 30, 15d supply, fill #0

## 2021-04-15 MED ORDER — MECLIZINE HCL 25 MG PO TABS
25.0000 mg | ORAL_TABLET | Freq: Three times a day (TID) | ORAL | 1 refills | Status: DC | PRN
  Filled 2021-04-15: qty 60, 20d supply, fill #0

## 2021-04-15 MED FILL — Metformin HCl Tab 1000 MG: ORAL | 30 days supply | Qty: 60 | Fill #0 | Status: AC

## 2021-04-15 NOTE — Progress Notes (Signed)
Virtual Visit via Telephone Note  I connected with Alexandra Aguirre, on 04/15/2021 at 10:48 AM by telephone due to the COVID-19 pandemic and verified that I am speaking with the correct person using two identifiers.   Consent: I discussed the limitations, risks, security and privacy concerns of performing an evaluation and management service by telephone and the availability of in person appointments. I also discussed with the patient that there may be a patient responsible charge related to this service. The patient expressed understanding and agreed to proceed.   Location of Patient: Environmental education officer of Provider: Clinic   Persons participating in Telemedicine visit: Alexandra Aguirre Dr. Margarita Rana     History of Present Illness: Alexandra Aguirre  is a 45 year old female with history of type 2 diabetes mellitus (A1c 9.1), hyperlipidemia, GERD, gastritis, asthma, bereaved of her son in the spring of 2021 seen for follow-up visit today.   Complains of redness of her R eye and a tissue growing in her eye. Denies presence of pain or itching. Complains of a missed period. LMP was 02/25/21 Also complains of dizziness, felt like everything was spinning and she fell on one occasion. It is not associated with change in position or turning of her head.  She complains of an insect bite her on her lip with associated swelling of her lip  With regards to her Diabetes she has both Humulin N 11 units bid and Humalog 15 units tid on her med list but she states she takes her insulin sometimes 15 units bid - about twice/week.  I am unable to verify if she takes Humulin N or Humalog. Past Medical History:  Diagnosis Date   Abnormal Pap smear of cervix 09/24/2013   AGUS   Acute non-recurrent maxillary sinusitis 11/01/2018   Acute tonsillitis 11/01/2018   Asthma    Diabetes mellitus without complication (HCC)    Environmental allergies    GERD (gastroesophageal reflux disease)    Non-recurrent acute  suppurative otitis media of left ear without spontaneous rupture of tympanic membrane 11/01/2018   Allergies  Allergen Reactions   Fruit & Vegetable Daily [Nutritional Supplements] Other (See Comments)    "mouth rash" from Avocado, apple, peaches, plums, kiwi    Current Outpatient Medications on File Prior to Visit  Medication Sig Dispense Refill   acetaminophen (TYLENOL) 325 MG tablet Take 650 mg by mouth every 6 (six) hours as needed.     atorvastatin (LIPITOR) 40 MG tablet TAKE 1 TABLET (40 MG TOTAL) BY MOUTH DAILY. (Patient not taking: Reported on 01/12/2021) 30 tablet 6   cetirizine (ZYRTEC) 10 MG tablet Take 1 tablet (10 mg total) by mouth daily. 30 tablet 1   clotrimazole (LOTRIMIN) 1 % cream Apply 1 application topically 2 (two) times daily. 30 g 0   cyproheptadine (PERIACTIN) 4 MG tablet Take 1 tablet (4 mg total) by mouth 3 (three) times daily as needed for allergies. 30 tablet 0   gabapentin (NEURONTIN) 300 MG capsule Take 1 capsule (300 mg total) by mouth at bedtime. 30 capsule 3   glipiZIDE (GLUCOTROL) 10 MG tablet TAKE 1 TABLET (10 MG TOTAL) BY MOUTH 2 (TWO) TIMES DAILY BEFORE A MEAL. 60 tablet 6   insulin lispro (HUMALOG) 100 UNIT/ML KwikPen INJECT 15 UNITS INTO THE SKIN 3 (THREE) TIMES DAILY. WITH MEALS. DO NOT INJECT IF YOU DO NOT EAT. 15 mL 11   Insulin NPH, Human,, Isophane, (HUMULIN N) 100 UNIT/ML Kiwkpen Inject 11 Units into the skin in  the morning and at bedtime. 15 mL 11   Insulin NPH, Human,, Isophane, (HUMULIN N) 100 UNIT/ML Kiwkpen INJECT 7 UNITS IN THE MORNING & 14 UNITS AT NIGHT 15 mL 11   Insulin NPH, Human,, Isophane, (HUMULIN N) 100 UNIT/ML Kiwkpen INJECT 11 UNITS INTO THE SKIN IN THE MORNING AND AT BEDTIME. 15 mL 11   Insulin Pen Needle (PEN NEEDLES 31GX5/16") 31G X 8 MM MISC 1 Device by Does not apply route 5 (five) times daily. 100 each 11   Insulin Pen Needle 31G X 8 MM MISC USE AS DIRECTED 5 TIMES DAILY 100 each 11   metFORMIN (GLUCOPHAGE) 1000 MG tablet Take  1 tablet (1,000 mg total) by mouth 2 (two) times daily with a meal. Take orally 3 tabs (1500 mg) in the morning and 2 tabs (1000 mg) in the evening 60 tablet 11   metFORMIN (GLUCOPHAGE) 1000 MG tablet TAKE 1 TABLET BY MOUTH 2 TIMES A DAY WITH MEALS 60 tablet 11   metFORMIN (GLUCOPHAGE) 500 MG tablet TAKE 3 TABLETS BY MOUTH IN THE MORNING AND 2 TABS IN THE EVENING 150 tablet 6   metFORMIN (GLUCOPHAGE) 500 MG tablet TAKE ORALLY 3 TABS (1500 MG) IN THE MORNING AND 2 TABS (1000 MG) IN THE EVENING 150 tablet 6   methylPREDNISolone (MEDROL DOSEPAK) 4 MG TBPK tablet Take as directed 21 tablet 0   mometasone-formoterol (DULERA) 100-5 MCG/ACT AERO INHALE 2 PUFFS INTO THE LUNGS 2 (TWO) TIMES DAILY. 13 g 6   mupirocin ointment (BACTROBAN) 2 % Apply 1 application topically 2 (two) times daily. 30 g 0   omeprazole (PRILOSEC) 10 MG capsule Take 10 mg by mouth daily.     pantoprazole (PROTONIX) 40 MG tablet TAKE 1 TABLET (40 MG TOTAL) BY MOUTH DAILY. 30 tablet 2   silver sulfADIAZINE (SILVADENE) 1 % cream Apply 1 application topically 2 (two) times daily. For 7 days 50 g 0   sitaGLIPtin (JANUVIA) 100 MG tablet TAKE 1 TABLET (100 MG TOTAL) BY MOUTH DAILY. 30 tablet 6   No current facility-administered medications on file prior to visit.    ROS: See HPI  Observations/Objective: Awake, alert, ranted x3 Not in acute distress  Lab Results  Component Value Date   HGBA1C 9.1 (A) 01/12/2021     Assessment and Plan: 1. Type 2 diabetes mellitus with complication, without long-term current use of insulin (HCC) Uncontrolled with A1c of 9.1 Will send of A1c and adjust regimen accordingly Counseled on Diabetic diet, my plate method, 150 minutes of moderate intensity exercise/week Blood sugar logs with fasting goals of 80-120 mg/dl, random of less than 180 and in the event of sugars less than 60 mg/dl or greater than 400 mg/dl encouraged to notify the clinic. Advised on the need for annual eye exams, annual foot  exams, Pneumonia vaccine. - Hemoglobin A1c - CMP14+EGFR  2. Missed period Urine pregnancy test is negative - POCT urine pregnancy  3. Pterygium of right eye Advised that she will need to seek ophthalmology care for evaluation-lack of medical coverage precludes referral to ophthalmology.  Discussed community resources including [facility], [facility]  4. Dizziness Likely vertigo Will evaluate with CBC Commence meclizine - CBC with Differential/Platelet - meclizine (ANTIVERT) 25 MG tablet; Take 1 tablet (25 mg total) by mouth 3 (three) times daily as needed for dizziness.  Dispense: 60 tablet; Refill: 1  5. Rash on lips Secondary to insect bite - hydrocortisone cream 0.5 %; Apply 1 application topically 2 (two) times   daily.  Dispense: 30 g; Refill: 0   Follow Up Instructions: 3 months   I discussed the assessment and treatment plan with the patient. The patient was provided an opportunity to ask questions and all were answered. The patient agreed with the plan and demonstrated an understanding of the instructions.   The patient was advised to call back or seek an in-person evaluation if the symptoms worsen or if the condition fails to improve as anticipated.     I provided 25 minutes total of non-face-to-face time during this encounter.   Enobong Newlin, MD, FAAFP. [facility] [place], [place] [phone]   04/15/2021, 10:48 AM  

## 2021-04-18 ENCOUNTER — Other Ambulatory Visit: Payer: Self-pay | Admitting: Family Medicine

## 2021-04-18 LAB — CMP14+EGFR
ALT: 16 IU/L (ref 0–32)
AST: 15 IU/L (ref 0–40)
Albumin/Globulin Ratio: 1.3 (ref 1.2–2.2)
Albumin: 4.4 g/dL (ref 3.8–4.8)
Alkaline Phosphatase: 146 IU/L — ABNORMAL HIGH (ref 44–121)
BUN/Creatinine Ratio: 13 (ref 9–23)
BUN: 7 mg/dL (ref 6–24)
Bilirubin Total: 0.4 mg/dL (ref 0.0–1.2)
CO2: 21 mmol/L (ref 20–29)
Calcium: 9.5 mg/dL (ref 8.7–10.2)
Chloride: 96 mmol/L (ref 96–106)
Creatinine, Ser: 0.52 mg/dL — ABNORMAL LOW (ref 0.57–1.00)
Globulin, Total: 3.5 g/dL (ref 1.5–4.5)
Glucose: 243 mg/dL — ABNORMAL HIGH (ref 65–99)
Potassium: 4.2 mmol/L (ref 3.5–5.2)
Sodium: 133 mmol/L — ABNORMAL LOW (ref 134–144)
Total Protein: 7.9 g/dL (ref 6.0–8.5)
eGFR: 117 mL/min/{1.73_m2} (ref >59)

## 2021-04-18 LAB — HEMOGLOBIN A1C
Est. average glucose Bld gHb Est-mCnc: 232 mg/dL
Hgb A1c MFr Bld: 9.7 % — ABNORMAL HIGH (ref 4.8–5.6)

## 2021-04-18 LAB — CBC WITH DIFFERENTIAL/PLATELET
Basophils Absolute: 0.1 10*3/uL (ref 0.0–0.2)
Basos: 1 %
EOS (ABSOLUTE): 0.4 10*3/uL (ref 0.0–0.4)
Eos: 4 %
Hematocrit: 40 % (ref 34.0–46.6)
Hemoglobin: 13.4 g/dL (ref 11.1–15.9)
Immature Grans (Abs): 0 10*3/uL (ref 0.0–0.1)
Immature Granulocytes: 0 %
Lymphocytes Absolute: 2.6 10*3/uL (ref 0.7–3.1)
Lymphs: 29 %
MCH: 29.5 pg (ref 26.6–33.0)
MCHC: 33.5 g/dL (ref 31.5–35.7)
MCV: 88 fL (ref 79–97)
Monocytes Absolute: 0.8 10*3/uL (ref 0.1–0.9)
Monocytes: 9 %
Neutrophils Absolute: 5.1 10*3/uL (ref 1.4–7.0)
Neutrophils: 57 %
Platelets: 377 10*3/uL (ref 150–450)
RBC: 4.55 x10E6/uL (ref 3.77–5.28)
RDW: 12.2 % (ref 11.7–15.4)
WBC: 9 10*3/uL (ref 3.4–10.8)

## 2021-04-19 ENCOUNTER — Other Ambulatory Visit: Payer: Self-pay

## 2021-04-19 ENCOUNTER — Other Ambulatory Visit: Payer: Self-pay | Admitting: Family Medicine

## 2021-04-19 MED ORDER — LANTUS SOLOSTAR 100 UNIT/ML ~~LOC~~ SOPN
10.0000 [IU] | PEN_INJECTOR | Freq: Every day | SUBCUTANEOUS | 99 refills | Status: DC
  Filled 2021-04-19: qty 3, 30d supply, fill #0
  Filled 2021-07-08: qty 3, 28d supply, fill #1

## 2021-04-21 ENCOUNTER — Other Ambulatory Visit: Payer: Self-pay

## 2021-04-26 ENCOUNTER — Ambulatory Visit (HOSPITAL_COMMUNITY)
Admission: EM | Admit: 2021-04-26 | Discharge: 2021-04-26 | Disposition: A | Discharge status: Home / Self Care | Payer: No Typology Code available for payment source | Attending: Emergency Medicine | Admitting: Emergency Medicine

## 2021-04-26 ENCOUNTER — Other Ambulatory Visit: Payer: Self-pay

## 2021-04-26 ENCOUNTER — Encounter (HOSPITAL_COMMUNITY): Payer: Self-pay

## 2021-04-26 DIAGNOSIS — R350 Frequency of micturition: Secondary | ICD-10-CM

## 2021-04-26 DIAGNOSIS — T63441A Toxic effect of venom of bees, accidental (unintentional), initial encounter: Secondary | ICD-10-CM

## 2021-04-26 DIAGNOSIS — M546 Pain in thoracic spine: Secondary | ICD-10-CM

## 2021-04-26 DIAGNOSIS — M549 Dorsalgia, unspecified: Secondary | ICD-10-CM

## 2021-04-26 LAB — POC URINE PREG, ED: Preg Test, Ur: NEGATIVE

## 2021-04-26 LAB — POCT URINALYSIS DIPSTICK, ED / UC
Bilirubin Urine: NEGATIVE
Glucose, UA: 500 mg/dL — AB
Hgb urine dipstick: NEGATIVE
Ketones, ur: NEGATIVE mg/dL
Leukocytes,Ua: NEGATIVE
Nitrite: NEGATIVE
Protein, ur: NEGATIVE mg/dL
Specific Gravity, Urine: 1.02 (ref 1.005–1.030)
Urobilinogen, UA: 0.2 mg/dL (ref 0.0–1.0)
pH: 6 (ref 5.0–8.0)

## 2021-04-26 LAB — CBG MONITORING, ED: Glucose-Capillary: 218 mg/dL — ABNORMAL HIGH (ref 70–99)

## 2021-04-26 MED ORDER — CEPHALEXIN 500 MG PO CAPS
1000.0000 mg | ORAL_CAPSULE | Freq: Two times a day (BID) | ORAL | 0 refills | Status: AC
  Filled 2021-04-26: qty 20, 5d supply, fill #0

## 2021-04-26 MED ORDER — TIZANIDINE HCL 4 MG PO TABS
4.0000 mg | ORAL_TABLET | Freq: Three times a day (TID) | ORAL | 0 refills | Status: DC | PRN
  Filled 2021-04-26: qty 30, 10d supply, fill #0

## 2021-04-26 MED ORDER — IBUPROFEN 600 MG PO TABS
600.0000 mg | ORAL_TABLET | Freq: Four times a day (QID) | ORAL | 0 refills | Status: DC | PRN
  Filled 2021-04-26: qty 30, 8d supply, fill #0

## 2021-04-26 NOTE — Discharge Instructions (Addendum)
Your blood glucose was 218.  I would check your sugar every day, and if it continues to be over 200, follow-up with your doctor to adjust your diabetic medications.  Continue cetirizine, apply hydrocortisone cream that has been prescribed to you to your foot.  Take 600 mg of ibuprofen combined with 1000 mg of Tylenol 3-4 times a day as needed for pain, swelling.  Ice to the area.  This will also help with your back  Zanaflex will help with your back.  I believe this is musculoskeletal.  Immediately to the ER for chest pain, shortness of breath, if you start coughing up blood, or for other concerns.

## 2021-04-26 NOTE — ED Triage Notes (Signed)
Pt reports right sided lower back pain, radiates to right leg x 1 day; bees bites in the right leg, right foot;  headache x 1 day.

## 2021-04-26 NOTE — ED Provider Notes (Addendum)
HPI  SUBJECTIVE:  Alexandra Aguirre is a 45 y.o. female who presents with multiple issues: First, she reports right-sided lower thoracic pain that radiates to her lower back and hip starting yesterday.  Is intermittent, sharp, lasting for about a minute and then resolving.  No fevers, coughing, wheezing, shortness of breath, trauma to this area, change in physical activity, repetitive motion, rash.     No hemoptysis, exogenous estrogen, surgery in the past 4 weeks.  She reports right calf pain, but states that she sustained multiple bee stings yesterday and the swelling started shortly after getting stung.  She has never had symptoms like this before.  No antipyretic in the past 6 hours.  She tried Tylenol with improvement in her symptoms.  Symptoms are worse with twisting, taking deep breaths.  Second, she states that she stepped on a bee nest yesterday and was stung multiple times on the right foot, ankle and on her right buttock.  She reports throbbing pain and pruritus, swelling, erythema of the foot.  She tried Tylenol with improvement in her symptoms.  Symptoms worse with walking.  Third, she also reports She reports urinary frequency, but has no urinary complaints.  This has been going on for 3 to 4 weeks.  Denies increased thirst. Thinks that she might have lost some weight, but is not sure.  She has a past medical history of asthma, UTI, back pain, diabetes.  She is on insulin, however she does not check her glucose at home.  Last time she checked her glucose it was last week.  Her baseline is in between 1 70-200.  No history of pyelonephritis, nephrolithiasis, PE, DVT.  LMP: 3 days ago.  She missed last month.  She states that she had a negative pregnancy test 2 to 3 weeks ago.  She has not been sexually active since then.  [facility] All history obtained through language line.    Past Medical History:  Diagnosis Date   Abnormal Pap smear of cervix 09/24/2013    AGUS   Acute non-recurrent maxillary sinusitis 11/01/2018   Acute tonsillitis 11/01/2018   Asthma    Diabetes mellitus without complication (HCC)    Environmental allergies    GERD (gastroesophageal reflux disease)    Non-recurrent acute suppurative otitis media of left ear without spontaneous rupture of tympanic membrane 11/01/2018    Past Surgical History:  Procedure Laterality Date   CERVICAL BIOPSY  W/ LOOP ELECTRODE EXCISION     removal of cyst Right 2011    Family History  Problem Relation Age of Onset   Cancer Mother        cervical/ovarian   Diabetes Mother    Diabetes Maternal Uncle    Diabetes Brother    Diabetes Maternal Uncle    Diabetes Maternal Uncle     Social History   Tobacco Use   Smoking status: Never   Smokeless tobacco: Never  Vaping Use   Vaping Use: Never used  Substance Use Topics   Alcohol use: No   Drug use: No    No current facility-administered medications for this encounter.  Current Outpatient Medications:    cephALEXin (KEFLEX) 500 MG capsule, Take 2 capsules (1,000 mg total) by mouth 2 (two) times daily for 5 days., Disp: 20 capsule, Rfl: 0   ibuprofen (ADVIL) 600 MG tablet, Take 1 tablet (600 mg total) by mouth every 6 (six) hours as needed., Disp: 30 tablet, Rfl: 0   tiZANidine (ZANAFLEX) 4  MG tablet, Take 1 tablet (4 mg total) by mouth every 8 (eight) hours as needed for muscle spasms., Disp: 30 tablet, Rfl: 0   acetaminophen (TYLENOL) 325 MG tablet, Take 650 mg by mouth every 6 (six) hours as needed., Disp: , Rfl:    atorvastatin (LIPITOR) 40 MG tablet, TAKE 1 TABLET (40 MG TOTAL) BY MOUTH DAILY. (Patient not taking: Reported on 01/12/2021), Disp: 30 tablet, Rfl: 6   cetirizine (ZYRTEC) 10 MG tablet, Take 1 tablet (10 mg total) by mouth daily., Disp: 30 tablet, Rfl: 1   clotrimazole (LOTRIMIN) 1 % cream, Apply 1 application topically 2 (two) times daily., Disp: 30 g, Rfl: 0   cyproheptadine (PERIACTIN) 4 MG tablet, Take 1 tablet (4  mg total) by mouth 3 (three) times daily as needed for allergies., Disp: 30 tablet, Rfl: 0   gabapentin (NEURONTIN) 300 MG capsule, Take 1 capsule (300 mg total) by mouth at bedtime., Disp: 30 capsule, Rfl: 3   glipiZIDE (GLUCOTROL) 10 MG tablet, TAKE 1 TABLET (10 MG TOTAL) BY MOUTH 2 (TWO) TIMES DAILY BEFORE A MEAL., Disp: 60 tablet, Rfl: 6   hydrocortisone cream 0.5 %, Apply 1 application topically 2 (two) times daily., Disp: 30 g, Rfl: 0   insulin glargine (LANTUS SOLOSTAR) 100 UNIT/ML Solostar Pen, Inject 10 Units into the skin daily., Disp: 15 mL, Rfl: PRN   Insulin Pen Needle (PEN NEEDLES 31GX5/16") 31G X 8 MM MISC, 1 Device by Does not apply route 5 (five) times daily., Disp: 100 each, Rfl: 11   Insulin Pen Needle 31G X 8 MM MISC, USE AS DIRECTED 5 TIMES DAILY, Disp: 100 each, Rfl: 11   meclizine (ANTIVERT) 25 MG tablet, Take 1 tablet (25 mg total) by mouth 3 (three) times daily as needed for dizziness., Disp: 60 tablet, Rfl: 1   metFORMIN (GLUCOPHAGE) 1000 MG tablet, Take 1 tablet (1,000 mg total) by mouth 2 (two) times daily with a meal. Take orally 3 tabs (1500 mg) in the morning and 2 tabs (1000 mg) in the evening, Disp: 60 tablet, Rfl: 11   metFORMIN (GLUCOPHAGE) 1000 MG tablet, TAKE 1 TABLET BY MOUTH 2 TIMES A DAY WITH MEALS, Disp: 60 tablet, Rfl: 11   metFORMIN (GLUCOPHAGE) 500 MG tablet, TAKE 3 TABLETS BY MOUTH IN THE MORNING AND 2 TABS IN THE EVENING, Disp: 150 tablet, Rfl: 6   metFORMIN (GLUCOPHAGE) 500 MG tablet, TAKE ORALLY 3 TABS (1500 MG) IN THE MORNING AND 2 TABS (1000 MG) IN THE EVENING, Disp: 150 tablet, Rfl: 6   mometasone-formoterol (DULERA) 100-5 MCG/ACT AERO, INHALE 2 PUFFS INTO THE LUNGS 2 (TWO) TIMES DAILY., Disp: 13 g, Rfl: 6   mupirocin ointment (BACTROBAN) 2 %, Apply 1 application topically 2 (two) times daily., Disp: 30 g, Rfl: 0   omeprazole (PRILOSEC) 10 MG capsule, Take 10 mg by mouth daily., Disp: , Rfl:    pantoprazole (PROTONIX) 40 MG tablet, TAKE 1 TABLET (40  MG TOTAL) BY MOUTH DAILY., Disp: 30 tablet, Rfl: 2   silver sulfADIAZINE (SILVADENE) 1 % cream, Apply 1 application topically 2 (two) times daily. For 7 days, Disp: 50 g, Rfl: 0   sitaGLIPtin (JANUVIA) 100 MG tablet, TAKE 1 TABLET (100 MG TOTAL) BY MOUTH DAILY., Disp: 30 tablet, Rfl: 6  Allergies  Allergen Reactions   Fruit & Vegetable Daily [Nutritional Supplements] Other (See Comments)    "mouth rash" from Avocado, apple, peaches, plums, kiwi     ROS  As noted in HPI.   Physical Exam  BP 120/81 (BP Location: Right Arm)   Pulse 65   Temp 98.3 F (36.8 C) (Oral)   Resp 18   SpO2 97%   Constitutional: Well developed, well nourished, no acute distress Eyes:  EOMI, conjunctiva normal bilaterally HENT: Normocephalic, atraumatic,mucus membranes moist Respiratory: Normal inspiratory effort, lungs clear bilaterally, good air movement. Cardiovascular: Normal rate GI: nondistended soft, nontender, no rebound, guarding.  No suprapubic, flank tenderness Back: Positive lower thoracic rib muscular tenderness and upper paralumbar tenderness.  Questionable CVAT. skin: No rash in the area of pain, skin intact Musculoskeletal: Right foot red, swollen, tender laterally where  stings are located.  Mildly increased temperature.  DP 2+.  Calves symmetric Neurologic: Alert & oriented x 3, no focal neuro deficits Psychiatric: Speech and behavior appropriate   ED Course   Medications - No data to display  Orders Placed This Encounter  Procedures   POC CBG monitoring    Standing Status:   Standing    Number of Occurrences:   1   POC Urinalysis dipstick    Standing Status:   Standing    Number of Occurrences:   1   POC urine pregnancy    Standing Status:   Standing    Number of Occurrences:   1    No results found for this or any previous visit (from the past 24 hour(s)).  No results found.  ED Clinical Impression  1. Acute right-sided thoracic back pain   2. Musculoskeletal back  pain   3. Bee sting, accidental or unintentional, initial encounter   4. Urinary frequency      ED Assessment/Plan  1.  Back pain.  Doubt pneumonia, pneumothorax in the absence of vitals, coughing, wheezing, shortness of breath.  She is PERC negative-doubt PE.  Deferring chest x-ray.  Strongly suspect musculoskeletal cause.  Tylenol, ibuprofen, Zanaflex.  No evidence of shingles.  She also has some tenderness over the kidney, so we will check for a UTI in the setting of urinary frequency over the past 3 to 4 weeks.  2.  Urinary frequency 3 to 4-week.  She has not checked her sugar in about a week.  She states that her glucose normally ranges between 1 70-200.  Will check fingerstick.  She has some glucoseurea, but she is on Januvia.  Her glucose is 218, which is within normal limits for her.  However, we will have her follow-up with her primary care provider for improved glucose control.  She does not have a UTI.  She is not pregnant.  3.  Multiple bee stings.  No evidence of infection at this time, because she is a diabetic, concerned that she may get a cellulitis.  Home with Tylenol/ibuprofen, Claritin or Zyrtec, Keflex.  Spent 50 minutes obtaining H&P, ordering and reviewing labs, discussing treatment plan and medical decision making with patient.  Follow-up with PMD in several days.  To the ER if she gets worse. Using the language line, discussed labs, MDM, treatment plan, and plan for follow-up with patient. Discussed sn/sx that should prompt return to the ED. patient agrees with plan.   Meds ordered this encounter  Medications   cephALEXin (KEFLEX) 500 MG capsule    Sig: Take 2 capsules (1,000 mg total) by mouth 2 (two) times daily for 5 days.    Dispense:  20 capsule    Refill:  0   tiZANidine (ZANAFLEX) 4 MG tablet    Sig: Take 1 tablet (4 mg total) by mouth every 8 (eight)  hours as needed for muscle spasms.    Dispense:  30 tablet    Refill:  0   ibuprofen (ADVIL) 600 MG tablet     Sig: Take 1 tablet (600 mg total) by mouth every 6 (six) hours as needed.    Dispense:  30 tablet    Refill:  0       *This clinic note was created using Scientist, clinical (histocompatibility and immunogenetics). Therefore, there may be occasional mistakes despite careful proofreading.  ?    Domenick Gong, MD 04/27/21 1449    Domenick Gong, MD 04/27/21 1450

## 2021-05-04 ENCOUNTER — Telehealth: Payer: Self-pay

## 2021-05-04 NOTE — Telephone Encounter (Signed)
Interpreter # [phone] [place]  Pt given lab results per notes of Dr. Alvis Lemmings on 04/19/21. Pt verbalized understanding.   Marland Kitchen

## 2021-05-07 ENCOUNTER — Emergency Department (HOSPITAL_COMMUNITY): Payer: No Typology Code available for payment source

## 2021-05-07 ENCOUNTER — Encounter (HOSPITAL_COMMUNITY): Payer: Self-pay | Admitting: Emergency Medicine

## 2021-05-07 ENCOUNTER — Emergency Department (HOSPITAL_COMMUNITY)
Admission: EM | Admit: 2021-05-07 | Discharge: 2021-05-07 | Disposition: A | Discharge status: Home / Self Care | Payer: No Typology Code available for payment source | Attending: Emergency Medicine | Admitting: Emergency Medicine

## 2021-05-07 ENCOUNTER — Other Ambulatory Visit: Payer: Self-pay

## 2021-05-07 DIAGNOSIS — S86912A Strain of unspecified muscle(s) and tendon(s) at lower leg level, left leg, initial encounter: Secondary | ICD-10-CM | POA: Insufficient documentation

## 2021-05-07 DIAGNOSIS — S161XXA Strain of muscle, fascia and tendon at neck level, initial encounter: Secondary | ICD-10-CM | POA: Insufficient documentation

## 2021-05-07 DIAGNOSIS — E119 Type 2 diabetes mellitus without complications: Secondary | ICD-10-CM | POA: Insufficient documentation

## 2021-05-07 DIAGNOSIS — Y92410 Unspecified street and highway as the place of occurrence of the external cause: Secondary | ICD-10-CM | POA: Diagnosis not present

## 2021-05-07 DIAGNOSIS — Z794 Long term (current) use of insulin: Secondary | ICD-10-CM | POA: Diagnosis not present

## 2021-05-07 DIAGNOSIS — J45909 Unspecified asthma, uncomplicated: Secondary | ICD-10-CM | POA: Diagnosis not present

## 2021-05-07 DIAGNOSIS — S199XXA Unspecified injury of neck, initial encounter: Secondary | ICD-10-CM | POA: Diagnosis present

## 2021-05-07 DIAGNOSIS — Z7984 Long term (current) use of oral hypoglycemic drugs: Secondary | ICD-10-CM | POA: Insufficient documentation

## 2021-05-07 LAB — PREGNANCY, URINE: Preg Test, Ur: NEGATIVE

## 2021-05-07 MED ORDER — OXYCODONE-ACETAMINOPHEN 5-325 MG PO TABS
1.0000 | ORAL_TABLET | Freq: Once | ORAL | Status: AC
  Administered 2021-05-07: 1 via ORAL
  Filled 2021-05-07: qty 1
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
## 2021-05-07 NOTE — Discharge Instructions (Addendum)
Take Tylenol or Motrin as needed for pain control.  Recommend resting, taking it easy over the next couple days.  If you develop chest pain, abdominal pain, worsening neck or back pain, come back to ER for reassessment.
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
## 2021-05-07 NOTE — ED Notes (Signed)
Patient transported to X-ray 

## 2021-07-08 ENCOUNTER — Other Ambulatory Visit: Payer: Self-pay

## 2021-07-08 MED FILL — Metformin HCl Tab 1000 MG: ORAL | 30 days supply | Qty: 60 | Fill #1 | Status: CN

## 2021-07-15 ENCOUNTER — Other Ambulatory Visit: Payer: Self-pay

## 2021-07-27 ENCOUNTER — Other Ambulatory Visit: Payer: Self-pay

## 2021-07-28 ENCOUNTER — Other Ambulatory Visit: Payer: Self-pay

## 2021-09-02 ENCOUNTER — Other Ambulatory Visit: Payer: Self-pay

## 2021-09-02 ENCOUNTER — Ambulatory Visit: Payer: Self-pay

## 2021-09-02 NOTE — Telephone Encounter (Signed)
°  Chief Complaint: Chest pain Symptoms: Pain in middle of chest and in back in shoulders. Hurts when takes a deep breath. Frequency: Started 3 days ago Pertinent Negatives: Patient denies  Disposition: ED /[] Urgent Care (no appt availability in office) / [] Appointment(In office/virtual)/ []  [facility]/ [] Home Care/ [] Refused Recommended Disposition  Additional Notes: Started having sharp chest pain 3 days ago when son was admitted in the hospital. Pain comes and goes. Feels tired, hurts with deep breath. Instructed to go to ED now. Used # [id number].   Reason for Disposition  Pain also in shoulder(s) or arm(s) or jaw (Exception: pain is clearly made worse by movement)  Answer Assessment - Initial Assessment Questions 1. LOCATION: "Where does it hurt?"       Middle 2. RADIATION: "Does the pain go anywhere else?" (e.g., into neck, jaw, arms, back)     Back in shoulders 3. ONSET: "When did the chest pain begin?" (Minutes, hours or days)      3 days ago 4. PATTERN "Does the pain come and go, or has it been constant since it started?"  "Does it get worse with exertion?"      Comes and goes 5. DURATION: "How long does it last" (e.g., seconds, minutes, hours)     Minutes 6. SEVERITY: "How bad is the pain?"  (e.g., Scale 1-10; mild, moderate, or severe)    - MILD (1-3): doesn't interfere with normal activities     - MODERATE (4-7): interferes with normal activities or awakens from sleep    - SEVERE (8-10): excruciating pain, unable to do any normal activities       8 7. CARDIAC RISK FACTORS: "Do you have any history of heart problems or risk factors for heart disease?" (e.g., angina, prior heart attack; diabetes, high blood pressure, high cholesterol, smoker, or strong family history of heart disease)     Diabetes 8. PULMONARY RISK FACTORS: "Do you have any history of lung disease?"  (e.g., blood clots in lung, asthma, emphysema, birth control pills)     No 9. CAUSE: "What do you think is causing the chest pain?"     Unsure 10. OTHER SYMPTOMS: "Do you have any other symptoms?" (e.g., dizziness, nausea, vomiting, sweating, fever, difficulty breathing, cough)       Hurts to take deep breath, feels tired, headache 11. PREGNANCY: "Is there any chance you are pregnant?" "When was your last menstrual period?"       No  Protocols used: Chest Pain-A-AH

## 2021-09-03 ENCOUNTER — Encounter (HOSPITAL_COMMUNITY): Payer: Self-pay | Admitting: Emergency Medicine

## 2021-09-03 ENCOUNTER — Emergency Department (HOSPITAL_COMMUNITY)
Admission: EM | Admit: 2021-09-03 | Discharge: 2021-09-03 | Disposition: A | Discharge status: Home / Self Care | Payer: Self-pay | Attending: Emergency Medicine | Admitting: Emergency Medicine

## 2021-09-03 ENCOUNTER — Emergency Department (HOSPITAL_COMMUNITY): Payer: Self-pay

## 2021-09-03 DIAGNOSIS — Z794 Long term (current) use of insulin: Secondary | ICD-10-CM | POA: Insufficient documentation

## 2021-09-03 DIAGNOSIS — K219 Gastro-esophageal reflux disease without esophagitis: Secondary | ICD-10-CM | POA: Insufficient documentation

## 2021-09-03 DIAGNOSIS — R0789 Other chest pain: Secondary | ICD-10-CM | POA: Insufficient documentation

## 2021-09-03 DIAGNOSIS — E1169 Type 2 diabetes mellitus with other specified complication: Secondary | ICD-10-CM | POA: Insufficient documentation

## 2021-09-03 DIAGNOSIS — Z79899 Other long term (current) drug therapy: Secondary | ICD-10-CM | POA: Insufficient documentation

## 2021-09-03 DIAGNOSIS — R079 Chest pain, unspecified: Secondary | ICD-10-CM

## 2021-09-03 DIAGNOSIS — E785 Hyperlipidemia, unspecified: Secondary | ICD-10-CM | POA: Insufficient documentation

## 2021-09-03 DIAGNOSIS — J45909 Unspecified asthma, uncomplicated: Secondary | ICD-10-CM | POA: Insufficient documentation

## 2021-09-03 DIAGNOSIS — N9489 Other specified conditions associated with female genital organs and menstrual cycle: Secondary | ICD-10-CM | POA: Insufficient documentation

## 2021-09-03 DIAGNOSIS — Z7984 Long term (current) use of oral hypoglycemic drugs: Secondary | ICD-10-CM | POA: Insufficient documentation

## 2021-09-03 LAB — CBC
HCT: 43 % (ref 36.0–46.0)
Hemoglobin: 14.2 g/dL (ref 12.0–15.0)
MCH: 29.5 pg (ref 26.0–34.0)
MCHC: 33 g/dL (ref 30.0–36.0)
MCV: 89.2 fL (ref 80.0–100.0)
Platelets: 348 10*3/uL (ref 150–400)
RBC: 4.82 MIL/uL (ref 3.87–5.11)
RDW: 12.2 % (ref 11.5–15.5)
WBC: 9.4 10*3/uL (ref 4.0–10.5)
nRBC: 0 % (ref 0.0–0.2)

## 2021-09-03 LAB — URINALYSIS, ROUTINE W REFLEX MICROSCOPIC
Bilirubin Urine: NEGATIVE
Glucose, UA: >=500 mg/dL — AB
Hgb urine dipstick: NEGATIVE
Ketones, ur: NEGATIVE mg/dL
Leukocytes,Ua: NEGATIVE
Nitrite: NEGATIVE
Protein, ur: NEGATIVE mg/dL
Specific Gravity, Urine: >1.03 — ABNORMAL HIGH (ref 1.005–1.030)
pH: 5.5 (ref 5.0–8.0)

## 2021-09-03 LAB — BASIC METABOLIC PANEL
Anion gap: 9 (ref 5–15)
BUN: 10 mg/dL (ref 6–20)
CO2: 23 mmol/L (ref 22–32)
Calcium: 9.2 mg/dL (ref 8.9–10.3)
Chloride: 100 mmol/L (ref 98–111)
Creatinine, Ser: 0.64 mg/dL (ref 0.44–1.00)
GFR, Estimated: >60 mL/min (ref >60)
Glucose, Bld: 287 mg/dL — ABNORMAL HIGH (ref 70–99)
Potassium: 3.8 mmol/L (ref 3.5–5.1)
Sodium: 132 mmol/L — ABNORMAL LOW (ref 135–145)

## 2021-09-03 LAB — I-STAT BETA HCG BLOOD, ED (MC, WL, AP ONLY): I-stat hCG, quantitative: <5 m[IU]/mL (ref <5)

## 2021-09-03 LAB — TROPONIN I (HIGH SENSITIVITY)
Troponin I (High Sensitivity): 4 ng/L (ref <18)
Troponin I (High Sensitivity): <2 ng/L (ref <18)

## 2021-09-03 LAB — URINALYSIS, MICROSCOPIC (REFLEX)

## 2021-09-03 MED ORDER — ACETAMINOPHEN 325 MG PO TABS
650.0000 mg | ORAL_TABLET | Freq: Once | ORAL | Status: AC
  Administered 2021-09-03: 650 mg via ORAL
  Filled 2021-09-03: qty 2

## 2021-09-03 NOTE — ED Provider Notes (Signed)
[facility] EMERGENCY DEPARTMENT Provider Note   CSN: [id number] Arrival date & time: 09/03/21  1304     History Chief Complaint  Patient presents with   Chest Pain    Alexandra Aguirre is a 45 y.o. female.  HPI  45 year old female with past medical history of DM, GERD presents emergency department with intermittent chest pain radiation into her bilateral arms.  Currently she is chest pain-free.  Patient states she is had the symptoms once before about a year ago when her 76 year old son committed suicide.  She states around this time a year she gets stressed out and now her second son is starting to have depressive symptoms and struggling.  This has brought on intermittent chest pain.  No shortness of breath, no swelling of her lower extremities.  No history of CAD.  No recent fever, cough.  The only other symptom that she endorses intermittent dysuria.  No vaginal bleeding or discharge.  Currently she feels fatigued but baseline.  Past Medical History:  Diagnosis Date   Abnormal Pap smear of cervix 09/24/2013   AGUS   Acute non-recurrent maxillary sinusitis 11/01/2018   Acute tonsillitis 11/01/2018   Asthma    Diabetes mellitus without complication ([place])    Environmental allergies    GERD (gastroesophageal reflux disease)    Non-recurrent acute suppurative otitis media of left ear without spontaneous rupture of tympanic membrane 11/01/2018    Patient Active Problem List   Diagnosis Date Noted   Bacterial vaginitis 08/27/2020   POP-Q stage 2 cystocele 09/20/2018   Fatty liver 06/14/2018   Hyperlipidemia 04/04/2018   Dental caries 06/21/2017   Vitamin D deficiency 03/01/2017   Type 2 diabetes mellitus with complication, without long-term current use of insulin ([place]) 10/12/2016   Gastroesophageal reflux disease 10/12/2016   Generalized anxiety disorder 10/12/2016   Asthma 01/15/2014   Vertigo 11/25/2013   Pedal edema 09/30/2013   Migraine headache  09/30/2013   Gastritis and gastroduodenitis 09/30/2013   Back pain 09/30/2013    Past Surgical History:  Procedure Laterality Date   CERVICAL BIOPSY  W/ LOOP ELECTRODE EXCISION     removal of cyst Right 2011     OB History     Gravida  6   Para  5   Term  5   Preterm      AB  1   Living  4      SAB  1   IAB      Ectopic      Multiple      Live Births  5           Family History  Problem Relation Age of Onset   Cancer Mother        cervical/ovarian   Diabetes Mother    Diabetes Maternal Uncle    Diabetes Brother    Diabetes Maternal Uncle    Diabetes Maternal Uncle     Social History   Tobacco Use   Smoking status: Never   Smokeless tobacco: Never  Vaping Use   Vaping Use: Never used  Substance Use Topics   Alcohol use: No   Drug use: No    Home Medications Prior to Admission medications   Medication Sig Start Date End Date Taking? Authorizing Provider  acetaminophen (TYLENOL) 325 MG tablet Take 650 mg by mouth every 6 (six) hours as needed.    [provider]  atorvastatin (LIPITOR) 40 MG tablet TAKE 1 TABLET (40 MG TOTAL) BY  MOUTH DAILY. Patient not taking: Reported on 01/12/2021 10/15/20 10/15/21  Hoy Register, MD  cetirizine (ZYRTEC) 10 MG tablet Take 1 tablet (10 mg total) by mouth daily. 01/12/21   Hoy Register, MD  clotrimazole (LOTRIMIN) 1 % cream Apply 1 application topically 2 (two) times daily. 01/12/21   Hoy Register, MD  cyproheptadine (PERIACTIN) 4 MG tablet Take 1 tablet (4 mg total) by mouth 3 (three) times daily as needed for allergies. 02/06/21   Rodriguez-Southworth, Nettie Elm, PA-C  gabapentin (NEURONTIN) 300 MG capsule Take 1 capsule (300 mg total) by mouth at bedtime. 01/12/21   Hoy Register, MD  glipiZIDE (GLUCOTROL) 10 MG tablet TAKE 1 TABLET (10 MG TOTAL) BY MOUTH 2 (TWO) TIMES DAILY BEFORE A MEAL. Patient taking differently: Take 10 mg by mouth 2 (two) times daily before a meal. 10/12/20 10/12/21  Hoy Register, MD  hydrocortisone cream 0.5 % Apply 1 application topically 2 (two) times daily. 04/15/21   Hoy Register, MD  ibuprofen (ADVIL) 600 MG tablet Take 1 tablet (600 mg total) by mouth every 6 (six) hours as needed. Patient taking differently: Take 600 mg by mouth every 6 (six) hours as needed for mild pain. 04/26/21   Domenick Gong, MD  insulin glargine (LANTUS SOLOSTAR) 100 UNIT/ML Solostar Pen Inject 10 Units into the skin daily. 04/19/21   Hoy Register, MD  Insulin Pen Needle (PEN NEEDLES 31GX5/16") 31G X 8 MM MISC 1 Device by Does not apply route 5 (five) times daily. 11/03/20   Venora Maples, MD  Insulin Pen Needle 31G X 8 MM MISC USE AS DIRECTED 5 TIMES DAILY Patient taking differently: 1 each by Other route 5 (five) times daily. 11/04/20 11/04/21  Venora Maples, MD  meclizine (ANTIVERT) 25 MG tablet Take 1 tablet (25 mg total) by mouth 3 (three) times daily as needed for dizziness. 04/15/21   Hoy Register, MD  metFORMIN (GLUCOPHAGE) 1000 MG tablet Take 1 tablet (1,000 mg total) by mouth 2 (two) times daily with a meal. Take orally 3 tabs (1500 mg) in the morning and 2 tabs (1000 mg) in the evening Patient taking differently: Take 1,000-1,500 mg by mouth 2 (two) times daily with a meal. Take orally 3 tabs (1500 mg) in the morning and 2 tabs (1000 mg) in the evening 11/03/20   Venora Maples, MD  metFORMIN (GLUCOPHAGE) 1000 MG tablet TAKE 1 TABLET BY MOUTH 2 TIMES A DAY WITH MEALS Patient not taking: Reported on 09/03/2021 11/04/20 11/04/21  Venora Maples, MD  metFORMIN (GLUCOPHAGE) 500 MG tablet TAKE 3 TABLETS BY MOUTH IN THE MORNING AND 2 TABS IN THE EVENING Patient not taking: Reported on 09/03/2021 10/12/20 10/12/21  Hoy Register, MD  metFORMIN (GLUCOPHAGE) 500 MG tablet TAKE ORALLY 3 TABS (1500 MG) IN THE MORNING AND 2 TABS (1000 MG) IN THE EVENING Patient not taking: Reported on 09/03/2021 01/16/20 09/03/21  Hoy Register, MD  mometasone-formoterol (DULERA)  100-5 MCG/ACT AERO INHALE 2 PUFFS INTO THE LUNGS 2 (TWO) TIMES DAILY. 01/12/21 01/12/22  Hoy Register, MD  mupirocin ointment (BACTROBAN) 2 % Apply 1 application topically 2 (two) times daily. 02/06/21   Rodriguez-Southworth, Nettie Elm, PA-C  omeprazole (PRILOSEC) 10 MG capsule Take 10 mg by mouth daily.    [provider]  pantoprazole (PROTONIX) 40 MG tablet TAKE 1 TABLET (40 MG TOTAL) BY MOUTH DAILY. Patient taking differently: Take 40 mg by mouth daily. 01/12/21 01/12/22  Hoy Register, MD  silver sulfADIAZINE (SILVADENE) 1 % cream Apply 1 application topically  2 (two) times daily. For 7 days 02/06/21   Rodriguez-Southworth, Sunday Spillers, PA-C  sitaGLIPtin (JANUVIA) 100 MG tablet TAKE 1 TABLET (100 MG TOTAL) BY MOUTH DAILY. Patient taking differently: Take 100 mg by mouth daily. 01/12/21 01/12/22  Charlott Rakes, MD  tiZANidine (ZANAFLEX) 4 MG tablet Take 1 tablet (4 mg total) by mouth every 8 (eight) hours as needed for muscle spasms. 04/26/21   Melynda Ripple, MD    Allergies    Fruit & vegetable daily [nutritional supplements]  Review of Systems   Review of Systems  Constitutional:  Positive for fatigue. Negative for chills and fever.  HENT:  Negative for congestion.   Eyes:  Negative for visual disturbance.  Respiratory:  Negative for shortness of breath.   Cardiovascular:  Positive for chest pain. Negative for palpitations and leg swelling.  Gastrointestinal:  Negative for abdominal pain, diarrhea and vomiting.  Genitourinary:  Positive for dysuria.  Skin:  Negative for rash.  Neurological:  Negative for headaches.   Physical Exam Updated Vital Signs BP 109/68    Pulse 73    Temp 98.6 F (37 C)    Resp 18    SpO2 100%   Physical Exam Vitals and nursing note reviewed.  Constitutional:      Appearance: Normal appearance.     Comments: Tearful at times  HENT:     Head: Normocephalic.     Mouth/Throat:     Mouth: Mucous membranes are moist.  Cardiovascular:     Rate and  Rhythm: Normal rate.  Pulmonary:     Effort: Pulmonary effort is normal. No respiratory distress.  Abdominal:     Palpations: Abdomen is soft.     Tenderness: There is no abdominal tenderness.  Skin:    General: Skin is warm.  Neurological:     Mental Status: She is alert and oriented to person, place, and time. Mental status is at baseline.  Psychiatric:        Mood and Affect: Mood normal.    ED Results / Procedures / Treatments   Labs (all labs ordered are listed, but only abnormal results are displayed) Labs Reviewed  BASIC METABOLIC PANEL - Abnormal; Notable for the following components:      Result Value   Sodium 132 (*)    Glucose, Bld 287 (*)    All other components within normal limits  CBC  URINALYSIS, ROUTINE W REFLEX MICROSCOPIC  I-STAT BETA HCG BLOOD, ED ([facility], [facility], [facility] ONLY)  TROPONIN I (HIGH SENSITIVITY)  TROPONIN I (HIGH SENSITIVITY)    EKG EKG Interpretation  Date/Time:  Friday September 03 2021 13:13:01 EST Ventricular Rate:  81 PR Interval:  146 QRS Duration: 82 QT Interval:  380 QTC Calculation: 441 R Axis:   36 Text Interpretation: Normal sinus rhythm Normal ECG Unchanged from previous Confirmed by Lavenia Atlas [phone]) on 09/03/2021 3:25:25 PM  Radiology DG Chest 2 View  Result Date: 09/03/2021 CLINICAL DATA:  Chest pain and shortness of breath. Left arm tingling. EXAM: CHEST - 2 VIEW COMPARISON:  May 07, 2021 FINDINGS: The heart size and mediastinal contours are within normal limits. Both lungs are clear. The visualized skeletal structures are unremarkable. IMPRESSION: No active cardiopulmonary disease. Electronically Signed   By: Dorise Bullion III M.D.   On: 09/03/2021 14:15    Procedures Procedures   Medications Ordered in ED Medications - No data to display  ED Course  I have reviewed the triage vital signs and the nursing notes.  Pertinent labs &  imaging results that were available during my care of the patient were reviewed by me  and considered in my medical decision making (see chart for details).    MDM Rules/Calculators/A&P                          45 year old female presents the emergency department with intermittent, nonexertional, self resolved chest pain.  Patient is primary Spanish-speaking, interpreter services used.  She is currently chest pain-free.  EKG is unchanged for her, vitals are normal.  Chest x-ray shows no acute findings.  Blood work is reassuring no acute abnormalities. Troponin is negative x2.  Low suspicion for CAD, I believe her symptoms are emotion/stress related.  She is otherwise a low heart score, would not need admission for emergent cardiac evaluation.  Will provide outpatient cardiology follow-up and paperwork.  In regards to the intermittent dysuria, patient is pending urinalysis.  Plan to rule out UTI.  If this is negative patient may be discharged with outpatient cardiology follow-up.  This is been explained to the patient in depth.  She understands and agrees with discharge plan.     Final Clinical Impression(s) / ED Diagnoses Final diagnoses:  None    Rx / DC Orders ED Discharge Orders     None        Lorelle Gibbs, DO 09/03/21 1634

## 2021-09-03 NOTE — ED Triage Notes (Signed)
Patient here with complaint of intermittent "pulsating" chest pressure and left arm numbness for the last three days. Denies shortness of breath. Patient alert, oriented, ambulatory and in no apparent distress at this time.

## 2021-09-03 NOTE — Discharge Instructions (Addendum)
You have been seen and discharged from the emergency department.  Your heart work-up was normal.  Call to establish care with cardiac doctor.  Follow-up with your primary provider for reevaluation and further care. Take home medications as prescribed. If you have any worsening symptoms or further concerns for your health please return to an emergency department for further evaluation.

## 2021-09-03 NOTE — ED Provider Notes (Signed)
Emergency Medicine Provider Triage Evaluation Note  Alexandra Aguirre , a 45 y.o. female  was evaluated in triage.  Pt complains of chest pain, central, that began 3 days ago. Patient states the pain radiates into her left arm and describes her left arm as going "numb". Patient denies any alleviating or aggravating factors. Denies worse with exertion. Patient also complaining of white vaginal discharge for the last 5 days. Patient has history of candidal infections. Patient also states she is unsure if the chest pain is linked to the anniversary of her son committing suicide which occurred this week 1 year ago. Patient states her other son was recently in the ED due to unknown reasons and this has caused her much stress.  Review of Systems  Positive: CP, SOB, nausea, HA, suprapubic pain Negative: Dysuria, vomiting, diarrhea  Physical Exam  BP 121/64 (BP Location: Right Arm)    Pulse 83    Temp 98.6 F (37 C)    Resp 18    SpO2 98%  Gen:   Awake, no distress   Resp:  Normal effort  MSK:   Moves extremities without difficulty  Other:    Medical Decision Making  Medically screening exam initiated at 1:34 PM.  Appropriate orders placed.  Alexandra Aguirre was informed that the remainder of the evaluation will be completed by another provider, this initial triage assessment does not replace that evaluation, and the importance of remaining in the ED until their evaluation is complete.  Patient interviewed utilizing interpreter.    Al Decant, PA-C 09/03/21 1338    Horton, Clabe Seal, DO 09/09/21 1146

## 2021-10-06 ENCOUNTER — Other Ambulatory Visit: Payer: Self-pay

## 2021-10-06 ENCOUNTER — Ambulatory Visit: Payer: Self-pay | Admitting: Physician Assistant

## 2021-10-06 ENCOUNTER — Ambulatory Visit: Payer: Self-pay | Admitting: *Deleted

## 2021-10-06 VITALS — BP 120/70 | HR 68 | Temp 98.2°F | Resp 18 | Ht 62.0 in

## 2021-10-06 DIAGNOSIS — Z3202 Encounter for pregnancy test, result negative: Secondary | ICD-10-CM

## 2021-10-06 DIAGNOSIS — E118 Type 2 diabetes mellitus with unspecified complications: Secondary | ICD-10-CM

## 2021-10-06 DIAGNOSIS — N912 Amenorrhea, unspecified: Secondary | ICD-10-CM

## 2021-10-06 DIAGNOSIS — L02229 Furuncle of trunk, unspecified: Secondary | ICD-10-CM

## 2021-10-06 LAB — POCT GLYCOSYLATED HEMOGLOBIN (HGB A1C): Hemoglobin A1C: 10.2 % — AB (ref 4.0–5.6)

## 2021-10-06 LAB — POCT URINE PREGNANCY: Preg Test, Ur: NEGATIVE

## 2021-10-06 MED ORDER — DOXYCYCLINE HYCLATE 100 MG PO TABS
100.0000 mg | ORAL_TABLET | Freq: Two times a day (BID) | ORAL | 0 refills | Status: DC
Start: 2021-10-06 — End: 2022-03-18
  Filled 2021-10-06: qty 20, 10d supply, fill #0

## 2021-10-06 MED FILL — Metformin HCl Tab 1000 MG: ORAL | 30 days supply | Qty: 60 | Fill #0 | Status: AC

## 2021-10-06 NOTE — Progress Notes (Signed)
Patient has eaten today. Patient reports pain in the left side of lower abdomen from a small cyst she noticed 1 week ago that has grown in size and is now red and painful.

## 2021-10-06 NOTE — Telephone Encounter (Signed)
° ° ° ° ° °  Chief Complaint: Pain and lump lower abdomen Symptoms: Red and warm, 8/10 pian, "Growing" Chills Frequency: 1 week ago Pertinent Negatives: Patient denies  Disposition: [] ED /[] Urgent Care (no appt availability in office) / [] Appointment(In office/virtual)/ []  [facility]/ [] Home Care/ [] Refused Recommended Disposition /[x]  Mobile Bus/ []  Follow-up with PCP Additional Notes: Assisted by Interpreter Salvador # [phone]  Reason for Disposition  [1] MILD-MODERATE pain AND [2] constant AND [3] present > 2 hours  Answer Assessment - Initial Assessment Questions 1. LOCATION: "Where does it hurt?"      Lower abdomen 2. RADIATION: "Does the pain shoot anywhere else?" (e.g., chest, back)     No 3. ONSET: "When did the pain begin?" (e.g., minutes, hours or days ago)      1 week ago 4. SUDDEN: "Gradual or sudden onset?"     Gradual 5. PATTERN "Does the pain come and go, or is it constant?"    - If constant: "Is it getting better, staying the same, or worsening?"      (Note: Constant means the pain never goes away completely; most serious pain is constant and it progresses)     - If intermittent: "How long does it last?" "Do you have pain now?"     (Note: Intermittent means the pain goes away completely between bouts)     Constant 6. SEVERITY: "How bad is the pain?"  (e.g., Scale 1-10; mild, moderate, or severe)   - MILD (1-3): doesn't interfere with normal activities, abdomen soft and not tender to touch    - MODERATE (4-7): interferes with normal activities or awakens from sleep, abdomen tender to touch    - SEVERE (8-10): excruciating pain, doubled over, unable to do any normal activities      8/10 7. RECURRENT SYMPTOM: "Have you ever had this type of stomach pain before?" If Yes, ask: "When was the last time?" and "What happened that time?"      No 8. CAUSE: "What do you think is causing the stomach pain?"      9. RELIEVING/AGGRAVATING FACTORS: "What makes  it better or worse?" (e.g., movement, antacids, bowel movement)      10. OTHER SYMPTOMS: "Do you have any other symptoms?" (e.g., back pain, diarrhea, fever, urination pain, vomiting)      Area red and warm, chills  Protocols used: Abdominal Pain - Female-A-AH

## 2021-10-06 NOTE — Patient Instructions (Signed)
You are going to take doxycycline twice a day for the next 10 days, make sure you keep the area clean and dry.  Please return to the mobile unit next week for fasting labs and a checkup of your diabetes.  Please bring all of your medications with you.  Roney Jaffe, PA-C Physician Assistant [facility] [email or web address]   Absceso en la piel Skin Abscess Un absceso en la piel es una zona infectada en la piel o debajo de la piel que contiene una acumulacin de pus y Conservator, museum/gallery. Al absceso tambin se le puede llamar fornculo, carbunclo o divieso. Un absceso puede presentarse en casi cualquier parte del cuerpo. Algunos abscesos se abren (rompen) por s solos. La mayora sigue empeorando si no se Research scientist (life sciences). La infeccin puede diseminarse hacia otras partes del cuerpo y Engineer, manufacturing a la [place], lo que puede hacer que usted se sienta mal. Trout, el tratamiento consiste en el drenaje del absceso. Cules son las causas? Un absceso se produce cuando los microbios, como las bacterias, pasan a travs de la piel y causan infeccin. Las causas de esto pueden ser las siguientes: Un rasguo o un corte en la piel. Una herida por puncin a travs de la piel, incluidas una inyeccin con aguja o la picadura de un insecto. Obstruccin de las glndulas sebceas o sudorparas. Obstruccin e infeccin de folculos capilares. Un quiste que se forma debajo de la piel (quiste sebceo) y se infecta. Qu incrementa el riesgo? Es ms probable que Dietitian en las personas que: Tienen debilitado el sistema de defensa del organismo (sistema inmunitario). Tienen diabetes. Tienen la piel seca e irritada. Reciben inyecciones con frecuencia o consumen drogas ilegales por va intravenosa. Tienen un cuerpo extrao en una herida; por ejemplo, Delfino Lovett. Tienen problemas en el sistema linftico o las venas. Cules son  los signos o sntomas? Los sntomas de esta afeccin incluyen: Una protuberancia dolorosa y firme debajo de la piel. Una protuberancia con pus en la parte superior. El pus puede romper la piel y Film/video editor exterior. Otros sntomas pueden ser los siguientes: Enrojecimiento en torno al Environmental consultant del absceso. Calor. Hinchazn de los ganglios linfticos (glndulas) cerca del absceso. Dolor a Insurance claims handler. Una lcera en la piel. Cmo se diagnostica? Esta afeccin se puede diagnosticar en funcin de lo siguiente: Un examen fsico. Sus antecedentes mdicos. Lauris Poag de pus. Esta puede utilizarse para Financial risk analyst qu est causando la infeccin. Anlisis de [employer]. Estudios de diagnstico por imgenes, como ecografa, exploracin por tomografa computarizada (TC) o Health visitor (RM). Cmo se trata? Un absceso pequeo que drena por s solo puede no Network engineer. El tratamiento de los abscesos ms grandes pueden incluir lo siguiente: Aplicacin de una compresa de calor hmedo en la zona varias veces por da. Un procedimiento para drenar el absceso (incisin y drenaje). Antibiticos. Para un absceso grave, puede recibir primero antibiticos a travs de una va intravenosa y [place] a los antibiticos por boca. Siga estas indicaciones en su casa: Medicamentos  [employer] de venta libre y los recetados solamente como se lo haya indicado el mdico. Si le recetaron un antibitico, tmelo como se lo haya indicado el mdico. No deje de tomar el antibitico aunque comience a sentirse mejor. Cuidado del absceso  Si usted tiene un absceso que no ha supurado, aplique calor en la zona afectada. Use la fuente de calor que el mdico le recomiende, como una compresa de calor hmedo  o una almohadilla trmica. Coloque una toalla entre la piel y la fuente de Airline pilot. Aplique el calor durante 20 a 30 minutos. Retire la fuente de calor si la piel se pone de color rojo brillante.  Esto es muy importante si no puede Financial risk analyst, calor o fro. Puede correr un riesgo mayor de sufrir quemaduras. Siga las indicaciones del mdico acerca del cuidado del absceso. Asegrese de hacer lo siguiente: Cubra el absceso con una venda (vendaje). Cambie el vendaje o la gasa como se lo haya indicado el mdico. Lvese las manos con agua y jabn antes de cambiar el vendaje o la gasa. Use desinfectante para manos si no dispone de [place] y [place]. Controle el [employer] para observar si hay signos de que la infeccin [place]. Est atento a los siguientes signos: Aumento del enrojecimiento, la hinchazn o Chief Technology Officer. Ms lquido [place]. Calor. Mal olor o ms pus. Indicaciones generales Para evitar la propagacin de la infeccin: No comparta artculos de cuidado personal, toallas ni jacuzzis con [employer]. Evite el contacto piel con piel con [employer]. Concurra a todas las visitas de control como se lo haya indicado el mdico. Esto es importante. Comunquese con un mdico si tiene: Aumento del enrojecimiento, la hinchazn o Chief Technology Officer alrededor del absceso. Aumento de la cantidad de lquido o sangre que Manufacturing systems engineer del absceso. La piel que rodea el absceso se siente caliente. Ms pus o un mal olor que proviene del absceso. Grant Ruts. Dolores musculares. Escalofros o sensacin general de Dentist. Solicite ayuda inmediatamente si: Tiene dolor intenso. Observa lneas rojas en la piel que se extienden desde el absceso. Resumen Un absceso en la piel es una zona infectada en la piel o debajo de la piel que contiene una acumulacin de pus y Conservator, museum/gallery. Un absceso pequeo que drena por s solo puede no Network engineer. El tratamiento de los abscesos ms grandes puede incluir un procedimiento para drenar el absceso y la toma de un antibitico. Esta informacin no tiene Theme park manager el consejo del mdico. Asegrese de hacerle al mdico cualquier pregunta que  tenga. Document Revised: 12/18/2017 Document Reviewed: 12/18/2017 Elsevier Patient Education  2022 [employer].

## 2021-10-06 NOTE — Progress Notes (Signed)
Established Patient Office Visit  Subjective:  Patient ID: Alexandra Aguirre, female    DOB: [birth year]  Age: 46 y.o. MRN: [id number]  CC:  Chief Complaint  Patient presents with   Recurrent Skin Infections    HPI Alexandra Aguirre reports that she has a bump on her lower abdomen, states it has been present for the past week, states that it has gotten bigger, and has become very tender to touch.  Denies drainage, has not tried anything for relief.  Request pregnancy test completed today, states that she did not have a menses last month, and did experience a miscarriage last spring.  Due to language barrier, an interpreter was present during the history-taking and subsequent discussion (and for part of the physical exam) with this patient.    Past Medical History:  Diagnosis Date   Abnormal Pap smear of cervix 09/24/2013   AGUS   Acute non-recurrent maxillary sinusitis 11/01/2018   Acute tonsillitis 11/01/2018   Asthma    Diabetes mellitus without complication (HCC)    Environmental allergies    GERD (gastroesophageal reflux disease)    Non-recurrent acute suppurative otitis media of left ear without spontaneous rupture of tympanic membrane 11/01/2018    Past Surgical History:  Procedure Laterality Date   CERVICAL BIOPSY  W/ LOOP ELECTRODE EXCISION     removal of cyst Right 2011    Family History  Problem Relation Age of Onset   Cancer Mother        cervical/ovarian   Diabetes Mother    Diabetes Maternal Uncle    Diabetes Brother    Diabetes Maternal Uncle    Diabetes Maternal Uncle     Social History   Socioeconomic History   Marital status: Married    Spouse name: Not on file   Number of children: Not on file   Years of education: Not on file   Highest education level: 3rd grade  Occupational History   Not on file  Tobacco Use   Smoking status: Never   Smokeless tobacco: Never  Vaping Use   Vaping Use: Never used  Substance and Sexual Activity    Alcohol use: No   Drug use: No   Sexual activity: Yes    Birth control/protection: None  Other Topics Concern   Not on file  Social History Narrative   Not on file   Social Determinants of Health   Financial Resource Strain: Not on file  Food Insecurity: Food Insecurity Present   Worried About Charity fundraiser in the Last Year: Sometimes true   Arboriculturist in the Last Year: Sometimes true  Transportation Needs: Not on file  Physical Activity: Not on file  Stress: Not on file  Social Connections: Not on file  Intimate Partner Violence: Not on file    Outpatient Medications Prior to Visit  Medication Sig Dispense Refill   acetaminophen (TYLENOL) 325 MG tablet Take 650 mg by mouth every 6 (six) hours as needed.     cetirizine (ZYRTEC) 10 MG tablet Take 1 tablet (10 mg total) by mouth daily. 30 tablet 1   gabapentin (NEURONTIN) 300 MG capsule Take 1 capsule (300 mg total) by mouth at bedtime. 30 capsule 3   glipiZIDE (GLUCOTROL) 10 MG tablet TAKE 1 TABLET (10 MG TOTAL) BY MOUTH 2 (TWO) TIMES DAILY BEFORE A MEAL. (Patient taking differently: Take 10 mg by mouth 2 (two) times daily before a meal.) 60 tablet 6   ibuprofen (ADVIL) 600  MG tablet Take 1 tablet (600 mg total) by mouth every 6 (six) hours as needed. (Patient taking differently: Take 600 mg by mouth every 6 (six) hours as needed for mild pain.) 30 tablet 0   insulin glargine (LANTUS SOLOSTAR) 100 UNIT/ML Solostar Pen Inject 10 Units into the skin daily. 15 mL PRN   Insulin Pen Needle (PEN NEEDLES 31GX5/16") 31G X 8 MM MISC 1 Device by Does not apply route 5 (five) times daily. 100 each 11   Insulin Pen Needle 31G X 8 MM MISC USE AS DIRECTED 5 TIMES DAILY (Patient taking differently: 1 each by Other route 5 (five) times daily.) 100 each 11   meclizine (ANTIVERT) 25 MG tablet Take 1 tablet (25 mg total) by mouth 3 (three) times daily as needed for dizziness. 60 tablet 1   metFORMIN (GLUCOPHAGE) 1000 MG tablet Take 1 tablet  (1,000 mg total) by mouth 2 (two) times daily with a meal. Take orally 3 tabs (1500 mg) in the morning and 2 tabs (1000 mg) in the evening (Patient taking differently: Take 1,000-1,500 mg by mouth 2 (two) times daily with a meal. Take orally 3 tabs (1500 mg) in the morning and 2 tabs (1000 mg) in the evening) 60 tablet 11   mometasone-formoterol (DULERA) 100-5 MCG/ACT AERO INHALE 2 PUFFS INTO THE LUNGS 2 (TWO) TIMES DAILY. 13 g 6   mupirocin ointment (BACTROBAN) 2 % Apply 1 application topically 2 (two) times daily. 30 g 0   omeprazole (PRILOSEC) 10 MG capsule Take 10 mg by mouth daily.     pantoprazole (PROTONIX) 40 MG tablet TAKE 1 TABLET (40 MG TOTAL) BY MOUTH DAILY. (Patient taking differently: Take 40 mg by mouth daily.) 30 tablet 2   sitaGLIPtin (JANUVIA) 100 MG tablet TAKE 1 TABLET (100 MG TOTAL) BY MOUTH DAILY. (Patient taking differently: Take 100 mg by mouth daily.) 30 tablet 6   tiZANidine (ZANAFLEX) 4 MG tablet Take 1 tablet (4 mg total) by mouth every 8 (eight) hours as needed for muscle spasms. 30 tablet 0   atorvastatin (LIPITOR) 40 MG tablet TAKE 1 TABLET (40 MG TOTAL) BY MOUTH DAILY. (Patient not taking: Reported on 01/12/2021) 30 tablet 6   clotrimazole (LOTRIMIN) 1 % cream Apply 1 application topically 2 (two) times daily. (Patient not taking: Reported on 10/06/2021) 30 g 0   cyproheptadine (PERIACTIN) 4 MG tablet Take 1 tablet (4 mg total) by mouth 3 (three) times daily as needed for allergies. (Patient not taking: Reported on 10/06/2021) 30 tablet 0   hydrocortisone cream 0.5 % Apply 1 application topically 2 (two) times daily. (Patient not taking: Reported on 10/06/2021) 30 g 0   metFORMIN (GLUCOPHAGE) 1000 MG tablet TAKE 1 TABLET BY MOUTH 2 TIMES A DAY WITH MEALS (Patient not taking: Reported on 09/03/2021) 60 tablet 11   metFORMIN (GLUCOPHAGE) 500 MG tablet TAKE 3 TABLETS BY MOUTH IN THE MORNING AND 2 TABS IN THE EVENING (Patient not taking: Reported on 09/03/2021) 150 tablet 6    metFORMIN (GLUCOPHAGE) 500 MG tablet TAKE ORALLY 3 TABS (1500 MG) IN THE MORNING AND 2 TABS (1000 MG) IN THE EVENING (Patient not taking: Reported on 09/03/2021) 150 tablet 6   silver sulfADIAZINE (SILVADENE) 1 % cream Apply 1 application topically 2 (two) times daily. For 7 days (Patient not taking: Reported on 10/06/2021) 50 g 0   No facility-administered medications prior to visit.    Allergies  Allergen Reactions   Fruit & Vegetable Daily [Nutritional Supplements] Other (See Comments)    "  mouth rash" from Avocado, apple, peaches, plums, kiwi    ROS Review of Systems  Constitutional:  Negative for chills and fever.  HENT: Negative.    Eyes: Negative.   Respiratory:  Negative for shortness of breath.   Cardiovascular:  Negative for chest pain.  Gastrointestinal: Negative.   Endocrine: Negative.   Genitourinary: Negative.   Musculoskeletal: Negative.   Skin:  Positive for color change.  Allergic/Immunologic: Negative.   Neurological: Negative.   Hematological: Negative.   Psychiatric/Behavioral: Negative.       Objective:    Physical Exam Vitals and nursing note reviewed.  Constitutional:      Appearance: Normal appearance. She is obese.  HENT:     Head: Normocephalic and atraumatic.     Right Ear: External ear normal.     Left Ear: External ear normal.     Nose: Nose normal.     Mouth/Throat:     Mouth: Mucous membranes are moist.     Pharynx: Oropharynx is clear.  Eyes:     Extraocular Movements: Extraocular movements intact.     Conjunctiva/sclera: Conjunctivae normal.     Pupils: Pupils are equal, round, and reactive to light.  Cardiovascular:     Rate and Rhythm: Normal rate and regular rhythm.     Pulses: Normal pulses.     Heart sounds: Normal heart sounds.  Musculoskeletal:     Cervical back: Normal range of motion and neck supple.  Skin:    Findings: Abscess present.          Comments: Dime sized nonfluctuant, tender to slight touch erythematic  nodule  Neurological:     General: No focal deficit present.     Mental Status: She is alert and oriented to person, place, and time.  Psychiatric:        Mood and Affect: Mood normal.        Behavior: Behavior normal.        Thought Content: Thought content normal.        Judgment: Judgment normal.    BP 120/70 (BP Location: Left Arm, Patient Position: Sitting, Cuff Size: Normal)    Pulse 68    Temp 98.2 F (36.8 C) (Oral)    Resp 18    Ht 5' 2"  (1.575 m)    SpO2 100%    BMI 31.09 kg/m  Wt Readings from Last 3 Encounters:  05/07/21 170 lb (77.1 kg)  01/12/21 182 lb 9.6 oz (82.8 kg)  11/26/20 181 lb (82.1 kg)     Health Maintenance Due  Topic Date Due   COVID-19 Vaccine (1) Never done   OPHTHALMOLOGY EXAM  Never done   Hepatitis C Screening  Never done   Pneumococcal Vaccine 16-67 Years old (2 - PCV) 04/05/2019   COLONOSCOPY (Pts 45-10yr Insurance coverage will need to be confirmed)  Never done   INFLUENZA VACCINE  04/19/2021   URINE MICROALBUMIN  10/13/2021    There are no preventive care reminders to display for this patient.  Lab Results  Component Value Date   TSH 3.050 10/13/2020   Lab Results  Component Value Date   WBC 9.4 09/03/2021   HGB 14.2 09/03/2021   HCT 43.0 09/03/2021   MCV 89.2 09/03/2021   PLT 348 09/03/2021   Lab Results  Component Value Date   NA 132 (L) 09/03/2021   K 3.8 09/03/2021   CO2 23 09/03/2021   GLUCOSE 287 (H) 09/03/2021   BUN 10 09/03/2021   CREATININE 0.64 09/03/2021  BILITOT 0.4 04/15/2021   ALKPHOS 146 (H) 04/15/2021   AST 15 04/15/2021   ALT 16 04/15/2021   PROT 7.9 04/15/2021   ALBUMIN 4.4 04/15/2021   CALCIUM 9.2 09/03/2021   ANIONGAP 9 09/03/2021   EGFR 117 04/15/2021   Lab Results  Component Value Date   CHOL 184 10/13/2020   Lab Results  Component Value Date   HDL 35 (L) 10/13/2020   Lab Results  Component Value Date   LDLCALC 117 (H) 10/13/2020   Lab Results  Component Value Date   TRIG 181 (H)  10/13/2020   Lab Results  Component Value Date   CHOLHDL 5.3 (H) 10/13/2020   Lab Results  Component Value Date   HGBA1C 10.2 (A) 10/06/2021      Assessment & Plan:   Problem List Items Addressed This Visit       Endocrine   Type 2 diabetes mellitus with complication, without long-term current use of insulin (HCC)   Relevant Orders   HgB A1c (Completed)     Musculoskeletal and Integument   Boil of trunk - Primary   Relevant Medications   doxycycline (VIBRA-TABS) 100 MG tablet     Other   Amenorrhea   Relevant Orders   POCT urine pregnancy (Completed)   Other Visit Diagnoses     Pregnancy examination or test, negative result           Meds ordered this encounter  Medications   doxycycline (VIBRA-TABS) 100 MG tablet    Sig: Take 1 tablet (100 mg total) by mouth 2 (two) times daily.    Dispense:  20 tablet    Refill:  0    Order Specific Question:   Supervising Provider    Answer:   WRIGHT, PATRICK E [[id number]]  1. Boil of trunk Trial doxycycline.  Patient education given on supportive care, red flags for prompt reevaluation - doxycycline (VIBRA-TABS) 100 MG tablet; Take 1 tablet (100 mg total) by mouth 2 (two) times daily.  Dispense: 20 tablet; Refill: 0  2. Type 2 diabetes mellitus with complication, without long-term current use of insulin (HCC) A1C 10.2, 5 months ago 9.7.    Patient encouraged to return to mobile unit in 1 week for fasting labs to be completed, patient encouraged to bring medications with her as well for review.  Patient understands and agrees   - HgB A1c  3. Amenorrhea  - POCT urine pregnancy  4. Pregnancy examination or test, negative result   I have reviewed the patient's medical history (PMH, PSH, Social History, Family History, Medications, and allergies) , and have been updated if relevant. I spent 30 minutes reviewing chart and  face to face time with patient.    Follow-up: Return in about 1 week (around 10/13/2021).     Loraine Grip Mayers, PA-C

## 2021-10-11 ENCOUNTER — Other Ambulatory Visit: Payer: Self-pay

## 2021-10-11 ENCOUNTER — Other Ambulatory Visit: Payer: Self-pay | Admitting: Family Medicine

## 2021-10-11 ENCOUNTER — Ambulatory Visit: Payer: Self-pay | Attending: Family Medicine

## 2021-10-11 DIAGNOSIS — E118 Type 2 diabetes mellitus with unspecified complications: Secondary | ICD-10-CM

## 2021-10-12 ENCOUNTER — Other Ambulatory Visit: Payer: Self-pay

## 2021-10-12 ENCOUNTER — Other Ambulatory Visit: Payer: Self-pay | Admitting: Family Medicine

## 2021-10-12 LAB — CMP14+EGFR
ALT: 21 IU/L (ref 0–32)
AST: 15 IU/L (ref 0–40)
Albumin/Globulin Ratio: 1.3 (ref 1.2–2.2)
Albumin: 4.3 g/dL (ref 3.8–4.8)
Alkaline Phosphatase: 140 IU/L — ABNORMAL HIGH (ref 44–121)
BUN/Creatinine Ratio: 25 — ABNORMAL HIGH (ref 9–23)
BUN: 12 mg/dL (ref 6–24)
Bilirubin Total: 0.5 mg/dL (ref 0.0–1.2)
CO2: 21 mmol/L (ref 20–29)
Calcium: 9.4 mg/dL (ref 8.7–10.2)
Chloride: 102 mmol/L (ref 96–106)
Creatinine, Ser: 0.48 mg/dL — ABNORMAL LOW (ref 0.57–1.00)
Globulin, Total: 3.3 g/dL (ref 1.5–4.5)
Glucose: 266 mg/dL — ABNORMAL HIGH (ref 70–99)
Potassium: 4.6 mmol/L (ref 3.5–5.2)
Sodium: 137 mmol/L (ref 134–144)
Total Protein: 7.6 g/dL (ref 6.0–8.5)
eGFR: 118 mL/min/{1.73_m2} (ref >59)

## 2021-10-12 LAB — LP+NON-HDL CHOLESTEROL
Cholesterol, Total: 182 mg/dL (ref 100–199)
HDL: 36 mg/dL — ABNORMAL LOW (ref >39)
LDL Chol Calc (NIH): 121 mg/dL — ABNORMAL HIGH (ref 0–99)
Total Non-HDL-Chol (LDL+VLDL): 146 mg/dL — ABNORMAL HIGH (ref 0–129)
Triglycerides: 136 mg/dL (ref 0–149)
VLDL Cholesterol Cal: 25 mg/dL (ref 5–40)

## 2021-10-12 MED ORDER — ATORVASTATIN CALCIUM 80 MG PO TABS
80.0000 mg | ORAL_TABLET | Freq: Every day | ORAL | 6 refills | Status: DC
  Filled 2021-10-12: qty 30, 30d supply, fill #0

## 2021-10-13 ENCOUNTER — Telehealth: Payer: Self-pay

## 2021-10-13 ENCOUNTER — Other Ambulatory Visit: Payer: Self-pay

## 2021-10-13 NOTE — Telephone Encounter (Signed)
-----   Message from Hoy Register, MD sent at 10/12/2021  1:07 PM EST ----- Please inform her that cholesterol is elevated.  I have sent a prescription for Lipitor 80 mg to her pharmacy.  Please advised to continue with a low-cholesterol diet and exercise.

## 2021-10-13 NOTE — Telephone Encounter (Signed)
Patient name and DOB has been verified Patient was informed of lab results. Patient had no questions.  

## 2021-10-18 ENCOUNTER — Other Ambulatory Visit: Payer: Self-pay

## 2021-10-22 ENCOUNTER — Other Ambulatory Visit: Payer: Self-pay

## 2021-10-22 MED ORDER — MELOXICAM 15 MG PO TABS
ORAL_TABLET | ORAL | 3 refills | Status: DC
  Filled 2021-10-22: qty 30, 30d supply, fill #0

## 2021-10-26 ENCOUNTER — Other Ambulatory Visit: Payer: Self-pay

## 2021-10-27 ENCOUNTER — Ambulatory Visit: Payer: No Typology Code available for payment source | Admitting: Family Medicine

## 2021-11-08 ENCOUNTER — Other Ambulatory Visit: Payer: Self-pay | Admitting: Obstetrics and Gynecology

## 2021-11-08 DIAGNOSIS — O209 Hemorrhage in early pregnancy, unspecified: Secondary | ICD-10-CM

## 2021-12-15 ENCOUNTER — Other Ambulatory Visit: Payer: Self-pay

## 2021-12-15 ENCOUNTER — Other Ambulatory Visit: Payer: Self-pay | Admitting: Family Medicine

## 2021-12-15 MED ORDER — NAPROXEN 500 MG PO TABS: ORAL_TABLET | ORAL | 0 refills | Status: AC

## 2021-12-17 ENCOUNTER — Other Ambulatory Visit: Payer: Self-pay

## 2022-02-01 ENCOUNTER — Other Ambulatory Visit: Payer: Self-pay

## 2022-02-01 MED ORDER — MELOXICAM 7.5 MG PO TABS
7.5000 mg | ORAL_TABLET | Freq: Every day | ORAL | 1 refills | Status: DC
  Filled 2022-02-01: qty 30, 30d supply, fill #0

## 2022-02-17 ENCOUNTER — Encounter: Payer: Self-pay | Admitting: Physician Assistant

## 2022-02-17 ENCOUNTER — Other Ambulatory Visit: Payer: Self-pay

## 2022-02-17 ENCOUNTER — Ambulatory Visit (INDEPENDENT_AMBULATORY_CARE_PROVIDER_SITE_OTHER): Payer: Self-pay | Admitting: Physician Assistant

## 2022-02-17 VITALS — BP 108/69 | HR 83 | Temp 98.0°F | Resp 18 | Ht 62.0 in | Wt 178.0 lb

## 2022-02-17 DIAGNOSIS — E6609 Other obesity due to excess calories: Secondary | ICD-10-CM

## 2022-02-17 DIAGNOSIS — J302 Other seasonal allergic rhinitis: Secondary | ICD-10-CM

## 2022-02-17 DIAGNOSIS — F4321 Adjustment disorder with depressed mood: Secondary | ICD-10-CM

## 2022-02-17 DIAGNOSIS — E7800 Pure hypercholesterolemia, unspecified: Secondary | ICD-10-CM

## 2022-02-17 DIAGNOSIS — F411 Generalized anxiety disorder: Secondary | ICD-10-CM

## 2022-02-17 DIAGNOSIS — K219 Gastro-esophageal reflux disease without esophagitis: Secondary | ICD-10-CM

## 2022-02-17 DIAGNOSIS — B3731 Acute candidiasis of vulva and vagina: Secondary | ICD-10-CM

## 2022-02-17 DIAGNOSIS — E559 Vitamin D deficiency, unspecified: Secondary | ICD-10-CM

## 2022-02-17 DIAGNOSIS — Z6832 Body mass index (BMI) 32.0-32.9, adult: Secondary | ICD-10-CM

## 2022-02-17 DIAGNOSIS — Z634 Disappearance and death of family member: Secondary | ICD-10-CM

## 2022-02-17 DIAGNOSIS — E1165 Type 2 diabetes mellitus with hyperglycemia: Secondary | ICD-10-CM

## 2022-02-17 DIAGNOSIS — E1142 Type 2 diabetes mellitus with diabetic polyneuropathy: Secondary | ICD-10-CM

## 2022-02-17 MED ORDER — FLUCONAZOLE 150 MG PO TABS
150.0000 mg | ORAL_TABLET | Freq: Once | ORAL | 0 refills | Status: AC
  Filled 2022-02-17: qty 1, 1d supply, fill #0

## 2022-02-17 MED ORDER — CETIRIZINE HCL 10 MG PO TABS
10.0000 mg | ORAL_TABLET | Freq: Every day | ORAL | 11 refills | Status: DC
  Filled 2022-02-17: qty 30, 30d supply, fill #0

## 2022-02-17 MED ORDER — GABAPENTIN 300 MG PO CAPS
300.0000 mg | ORAL_CAPSULE | Freq: Every day | ORAL | 3 refills | Status: DC
Start: 2022-02-17 — End: 2022-12-07
  Filled 2022-02-17: qty 30, 30d supply, fill #0

## 2022-02-17 MED ORDER — SITAGLIPTIN PHOSPHATE 100 MG PO TABS
ORAL_TABLET | Freq: Every day | ORAL | 2 refills | Status: DC
  Filled 2022-02-17: qty 30, 30d supply, fill #0
  Filled 2022-03-14: qty 90, 90d supply, fill #0

## 2022-02-17 MED ORDER — METFORMIN HCL 500 MG PO TABS
ORAL_TABLET | ORAL | 1 refills | Status: DC
  Filled 2022-02-17: qty 150, fill #0
  Filled 2022-10-26: qty 150, 30d supply, fill #0

## 2022-02-17 MED ORDER — TRUEPLUS LANCETS 28G MISC
1.0000 | Freq: Two times a day (BID) | 11 refills | Status: AC
  Filled 2022-02-17: qty 100, 50d supply, fill #0

## 2022-02-17 MED ORDER — METFORMIN HCL 1000 MG PO TABS
ORAL_TABLET | ORAL | 1 refills | Status: DC
  Filled 2022-02-17: qty 90, fill #0

## 2022-02-17 MED ORDER — PANTOPRAZOLE SODIUM 40 MG PO TBEC
DELAYED_RELEASE_TABLET | Freq: Every day | ORAL | 2 refills | Status: AC
Start: 2022-02-17 — End: ?
  Filled 2022-02-17: qty 30, 30d supply, fill #0

## 2022-02-17 MED ORDER — TRUE METRIX METER W/DEVICE KIT
1.0000 [IU] | PACK | Freq: Two times a day (BID) | 0 refills | Status: AC
  Filled 2022-02-17: qty 1, 30d supply, fill #0

## 2022-02-17 MED ORDER — TRUE METRIX BLOOD GLUCOSE TEST VI STRP
1.0000 | ORAL_STRIP | Freq: Two times a day (BID) | 12 refills | Status: AC
  Filled 2022-02-17: qty 100, 50d supply, fill #0

## 2022-02-17 MED ORDER — ATORVASTATIN CALCIUM 80 MG PO TABS
80.0000 mg | ORAL_TABLET | Freq: Every day | ORAL | 6 refills | Status: DC
Start: 2022-02-17 — End: 2022-12-07
  Filled 2022-02-17: qty 30, 30d supply, fill #0

## 2022-02-17 NOTE — Patient Instructions (Signed)
To help with your throat discomfort, I do encourage you to start taking Protonix once daily.  I sent refills to your pharmacy along with a blood sugar monitoring machine.  Please start checking your blood sugars on a daily basis, write down the numbers and bring those with you when you follow-up on the mobile unit in 2 weeks.  We will call you with today's lab results.  Roney Jaffe, PA-C Physician Assistant [facility] [email or web address]   Cmo sobrellevar la depresin en los adultos Managing Depression, Adult La depresin es una afeccin de salud mental que puede influir en los pensamientos, los sentimientos y [place]. Recibir el diagnstico de depresin puede traerle alivio si no saba por qu se senta o se comportaba de Chiropodist. Tambin podra hacerlo sentir abrumado por la incertidumbre acerca de su futuro. Prepararse para sobrellevar los sntomas puede ayudarle a sentirse ms positivo sobre el futuro. Cmo manejar los cambios en el estilo de vida Control del estrs  El estrs es la reaccin del cuerpo ante los cambios y los acontecimientos de la vida, tanto buenos [place]. El estrs puede aumentar los sentimientos de depresin. Aprender a controlar el estrs puede ayudar a disminuir los sentimientos de depresin. Pruebe algunos de los siguientes enfoques para reducir Development worker, community (tcnicas de reduccin del estrs): Escuche msica que le guste y que lo inspire. Intente usar una aplicacin de meditacin o tome una clase de meditacin. Desarrolle una prctica que le ayude a conectarse con su ser espiritual. Camine al [employer], rece o vaya a un lugar de culto. Respire profundo. Para hacerlo, inhale lentamente por la nariz. Haga una pausa cuando alcance el mximo de la inhalacin durante unos segundos y luego exhale lentamente, dejando que los msculos se relajen. Practique yoga para relajarse y trabajar con  los msculos. Elija una tcnica para reducir el estrs que se adapte a su estilo de vida y su personalidad. Desarrollar estas tcnicas lleva tiempo y prctica. Resrvate de 5 a 15 minutos por da para hacerlas. Algunos terapeutas pueden ofrecerle capacitacin para aprenderlas. Otras cosas que puede hacer para manejar el estrs: Midwife un registro del estrs. Conocer sus lmites y Designer, jewellery que no cuando piensa que algo es [place]. Prestar atencin a cmo reacciona ante ciertas situaciones. Es posible que no pueda controlar todo, pero s puede controlar su reaccin. Aadir humor a su vida viendo comedias o programas cmicos de TV. Hacerse tiempo para las actividades que disfruta y que lo Banker.  Medicamentos Los medicamentos, como los antidepresivos, suelen ser parte del tratamiento para la depresin. Hable con su farmacutico o mdico sobre [employer], suplementos y productos a base de hierbas que toma, sus posibles efectos secundarios, y qu medicamentos y otros productos que se pueden tomar al mismo tiempo sin correr Herbalist. Es importante que informe a su mdico si tiene efectos secundarios. Las [employer] mdico puede sugerirle terapia familiar, terapia de pareja o terapia individual como parte del tratamiento. Cmo reconocer los [employer] persona tiene una respuesta distinta al tratamiento para la depresin. A medida que se recupere de la depresin, puede comenzar a hacer lo siguiente: Tener ms inters en hacer sus actividades. Sentirse menos desesperanzado. Tener ms energa. Comer en exceso con menos frecuencia o tener un mejor apetito. Tener mejor atencin mental. Es importante reconocer si la depresin no mejora o si empeora. Los sntomas que tuvo en el comienzo pueden volver, por ejemplo: Cansancio (fatiga) o poca energa. Comer demasiado o [place]  poco. Dormir demasiado o muy poco. Sentirse agotado, agitado o desesperanzado. Dificultad para concentrarse o para  tomar decisiones. Dolencias fsicas que no tienen motivo. Sentirse irritable, enojado o agresivo. Si usted o sus familiares advierten que estos sntomas regresan, infrmeselo al mdico de inmediato. Siga estas instrucciones en su casa: Actividad  Intente realizar alguna forma de ejercicio todos los [place], como caminar, Lobbyist, nadar o levantar pesas. Practique tcnicas de reduccin del estrs. Active la mente tomando clases o realizando algn trabajo voluntario. Estilo de vida [place] bien y por el Tower. No consuma ms caf, tabaco, alcohol ni otras sustancias potencialmente peligrosas. Siga una dieta saludable que incluya abundantes frutas, verduras, cereales integrales, productos lcteos descremados y protenas magras. No consuma muchos alimentos ricos en grasas slidas, azcares agregados o sal (sodio). Instrucciones generales Use los medicamentos de venta libre y los recetados solamente como se lo haya indicado el mdico. Oceanographer a todas las visitas de seguimiento como se lo haya indicado el mdico. Esto es importante. Dnde encontrar apoyo Hablar con [employer]  Los amigos y familiares pueden ser fuentes de apoyo y [place]. Hable con amigos o familiares de confianza sobre su afeccin. Explqueles cules son sus sntomas e infrmeles que est trabajando con un mdico para tratar la depresin. Dgales a sus amigos y familiares cmo tambin pueden ser de [place]. Finanzas Busque proveedores de salud mental adecuados que se adapten a su situacin financiera. Hable con el mdico sobre las opciones para obtener descuentos en los medicamentos. Dnde buscar ms informacin Puede encontrar apoyo en su zona en: Anxiety and Depression Association of [place] [ADAA] (Asociacin de Ansiedad y Depresin de los [place]): www.adaa.org Mental Health [place] (Salud Mental de los [place]): www.mentalhealthamerica.net The [employer] on Mental Illness (Alianza  Nacional Sobre Enfermedades Mentales): www.nami.org Comunquese con un mdico si: Deja de tomar los medicamentos antidepresivos y tiene alguno de estos sntomas: Nuseas. Dolor de [place]. Desvanecimiento. Escalofros y dolores corporales. Imposibilidad de dormir (insomnio). Usted o algn amigo o familiar advierten que su depresin est empeorando. Solicite ayuda de inmediato si: Piensa en lastimarse a usted mismo o a Economist. Si alguna vez siente que puede lastimarse a usted mismo o a Economist, o tiene pensamientos de poner fin a su vida, busque ayuda de inmediato. Dirjase al departamento de emergencias ms cercano o: Comunquese con el servicio de emergencias de su localidad (911 en los [place]). Llame a una lnea de asistencia al suicida y atencin en crisis como National Suicide Prevention Lifeline (Lnea Nacional de Prevencin del Suicidio) al 2392693946. Est disponible las 24 horas del da en los [place] Enve un mensaje de texto a la lnea para casos de crisis al [phone] (en los [place] U[place].). Resumen Si le diagnostican depresin, prepararse para sobrellevar los sntomas es una buena forma de sentirse positivo sobre su futuro. Trabaje con su mdico en un plan de tratamiento que incluya tcnicas de reduccin del estrs, medicamentos (si corresponde), terapia y hbitos saludables. Siga hablando con el mdico acerca de cmo funciona el tratamiento. Si tiene pensamientos acerca de quitarse la vida, llame a una lnea de asistencia al suicida o enve un mensaje de texto a una lnea de asistencia en crisis. Esta informacin no tiene Theme park manager el consejo del mdico. Asegrese de hacerle al mdico cualquier pregunta que tenga. Document Revised: 10/10/2019 Document Reviewed: 10/10/2019 Elsevier Patient Education  2023 [employer].
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
## 2022-02-18 ENCOUNTER — Other Ambulatory Visit: Payer: Self-pay

## 2022-02-18 LAB — CBC WITH DIFFERENTIAL/PLATELET
Basophils Absolute: 0.1 10*3/uL (ref 0.0–0.2)
Basos: 1 %
EOS (ABSOLUTE): 0.3 10*3/uL (ref 0.0–0.4)
Eos: 4 %
Hematocrit: 39.2 % (ref 34.0–46.6)
Hemoglobin: 13.3 g/dL (ref 11.1–15.9)
Immature Grans (Abs): 0 10*3/uL (ref 0.0–0.1)
Immature Granulocytes: 0 %
Lymphocytes Absolute: 2.1 10*3/uL (ref 0.7–3.1)
Lymphs: 28 %
MCH: 30 pg (ref 26.6–33.0)
MCHC: 33.9 g/dL (ref 31.5–35.7)
MCV: 89 fL (ref 79–97)
Monocytes Absolute: 0.6 10*3/uL (ref 0.1–0.9)
Monocytes: 8 %
Neutrophils Absolute: 4.5 10*3/uL (ref 1.4–7.0)
Neutrophils: 59 %
Platelets: 336 10*3/uL (ref 150–450)
RBC: 4.43 x10E6/uL (ref 3.77–5.28)
RDW: 12.5 % (ref 11.7–15.4)
WBC: 7.6 10*3/uL (ref 3.4–10.8)

## 2022-02-18 LAB — COMP. METABOLIC PANEL (12)
AST: 15 IU/L (ref 0–40)
Albumin/Globulin Ratio: 1.3 (ref 1.2–2.2)
Albumin: 4 g/dL (ref 3.8–4.8)
Alkaline Phosphatase: 150 IU/L — ABNORMAL HIGH (ref 44–121)
BUN/Creatinine Ratio: 23 (ref 9–23)
BUN: 10 mg/dL (ref 6–24)
Bilirubin Total: 0.3 mg/dL (ref 0.0–1.2)
Calcium: 9.3 mg/dL (ref 8.7–10.2)
Chloride: 99 mmol/L (ref 96–106)
Creatinine, Ser: 0.44 mg/dL — ABNORMAL LOW (ref 0.57–1.00)
Globulin, Total: 3.1 g/dL (ref 1.5–4.5)
Glucose: 313 mg/dL — ABNORMAL HIGH (ref 70–99)
Potassium: 4 mmol/L (ref 3.5–5.2)
Sodium: 134 mmol/L (ref 134–144)
Total Protein: 7.1 g/dL (ref 6.0–8.5)
eGFR: 121 mL/min/{1.73_m2} (ref >59)

## 2022-02-18 LAB — MICROALBUMIN / CREATININE URINE RATIO
Creatinine, Urine: 64 mg/dL
Microalb/Creat Ratio: 28 mg/g creat (ref 0–29)
Microalbumin, Urine: 17.8 ug/mL

## 2022-02-18 LAB — VITAMIN D 25 HYDROXY (VIT D DEFICIENCY, FRACTURES): Vit D, 25-Hydroxy: 21.9 ng/mL — ABNORMAL LOW (ref 30.0–100.0)

## 2022-02-18 LAB — HEMOGLOBIN A1C
Est. average glucose Bld gHb Est-mCnc: 266 mg/dL
Hgb A1c MFr Bld: 10.9 % — ABNORMAL HIGH (ref 4.8–5.6)

## 2022-02-21 ENCOUNTER — Other Ambulatory Visit: Payer: Self-pay

## 2022-02-24 ENCOUNTER — Telehealth: Payer: Self-pay

## 2022-02-24 NOTE — Telephone Encounter (Signed)
-----   Message from Roney Jaffe, [place] sent at 02/21/2022  2:50 PM EDT ----- Please call patient and let her know that her kidney function is in within normal limits, she does show signs of dehydration.  She does not show signs of anemia.  She does have an elevated liver enzyme, this does appear to be chronic, it may be due to her statin use, however patient also has fatty liver disease.  It is very important that she work hard to control her blood glucose levels.  Her vitamin D levels are below normal limits.  She needs to take 2000 units once daily.  She will purchase this over-the-counter.

## 2022-03-14 ENCOUNTER — Other Ambulatory Visit: Payer: Self-pay

## 2022-03-18 ENCOUNTER — Ambulatory Visit (HOSPITAL_COMMUNITY)
Admission: EM | Admit: 2022-03-18 | Discharge: 2022-03-18 | Disposition: A | Discharge status: Home / Self Care | Payer: No Typology Code available for payment source | Attending: Emergency Medicine | Admitting: Emergency Medicine

## 2022-03-18 ENCOUNTER — Encounter (HOSPITAL_COMMUNITY): Payer: Self-pay | Admitting: *Deleted

## 2022-03-18 ENCOUNTER — Other Ambulatory Visit: Payer: Self-pay

## 2022-03-18 ENCOUNTER — Ambulatory Visit: Payer: Self-pay

## 2022-03-18 DIAGNOSIS — E1165 Type 2 diabetes mellitus with hyperglycemia: Secondary | ICD-10-CM

## 2022-03-18 DIAGNOSIS — T7840XA Allergy, unspecified, initial encounter: Secondary | ICD-10-CM

## 2022-03-18 DIAGNOSIS — R739 Hyperglycemia, unspecified: Secondary | ICD-10-CM

## 2022-03-18 LAB — CBG MONITORING, ED: Glucose-Capillary: 267 mg/dL — ABNORMAL HIGH (ref 70–99)

## 2022-03-18 LAB — POCT URINALYSIS DIPSTICK, ED / UC
Bilirubin Urine: NEGATIVE
Glucose, UA: NEGATIVE mg/dL
Hgb urine dipstick: NEGATIVE
Leukocytes,Ua: NEGATIVE
Nitrite: NEGATIVE
Protein, ur: NEGATIVE mg/dL
Specific Gravity, Urine: 1.02 (ref 1.005–1.030)
Urobilinogen, UA: 0.2 mg/dL (ref 0.0–1.0)
pH: 8.5 — ABNORMAL HIGH (ref 5.0–8.0)

## 2022-03-18 MED ORDER — METHYLPREDNISOLONE SODIUM SUCC 125 MG IJ SOLR
INTRAMUSCULAR | Status: AC
  Filled 2022-03-18: qty 2

## 2022-03-18 MED ORDER — METHYLPREDNISOLONE SODIUM SUCC 125 MG IJ SOLR
60.0000 mg | Freq: Once | INTRAMUSCULAR | Status: AC
  Administered 2022-03-18: 60 mg via INTRAMUSCULAR

## 2022-03-18 MED ORDER — LANTUS SOLOSTAR 100 UNIT/ML ~~LOC~~ SOPN
15.0000 [IU] | PEN_INJECTOR | Freq: Every day | SUBCUTANEOUS | 99 refills | Status: DC
  Filled 2022-03-18: qty 15, 100d supply, fill #0

## 2022-03-18 MED ORDER — PREDNISONE 10 MG PO TABS
20.0000 mg | ORAL_TABLET | Freq: Every day | ORAL | 0 refills | Status: AC
  Filled 2022-03-18: qty 6, 3d supply, fill #0

## 2022-03-18 NOTE — ED Triage Notes (Signed)
Pt 's face red and swolled from what Pt reports from poison ivy. Pt also has rash on arms.

## 2022-03-18 NOTE — Telephone Encounter (Signed)
  Chief Complaint: rash and swelling Symptoms: exposure to posion ivy: hands with red spots and face and eyes with red spots rash and both face and eyes are swollen., severe itching Frequency: today Pertinent Negatives:  Disposition: [] ED /[x] Urgent Care (no appt availability in office) / [] Appointment(In office/virtual)/ []  [facility]/ [] Home Care/ [] Refused Recommended Disposition /[] [facility]/ []  Follow-up with PCP Additional Notes: UC due to pt condition  Reason for Disposition  [1] Severe poison ivy, oak, or sumac reaction in the past AND [2] face or genitals involved  Answer Assessment - Initial Assessment Questions 1. APPEARANCE of RASH: "Describe the rash."      Poison ivy source. Red spots 2. LOCATION: "Where is the rash located?"  (e.g., face, genitals, hands, legs)     Hands and face and eyes. Face and eyes are swollen and red. 3. SIZE: "How large is the rash?"      *No Answer* 4. ONSET: "When did the rash begin?"      *No Answer* 5. ITCHING: "Does the rash itch?" If Yes, ask: "How bad is it?"   - MILD - doesn't interfere with normal activities   - MODERATE-SEVERE: interferes with work, school, sleep, or other activities      Moderate to severe 6. EXPOSURE:  "How were you exposed to the plant (poison ivy, poison oak, sumac)"  "When were you exposed?"       Poison ivy 7. PAST HISTORY: "Have you had a poison ivy rash before?" If Yes, ask: "How bad was it?"     N/a 8. PREGNANCY: "Is there any chance you are pregnant?" "When was your last menstrual period?"     N/a  Protocols used: Poison Ivy - Oak - [facility]

## 2022-03-18 NOTE — Discharge Instructions (Addendum)
Please take the prednisone 20 mg (two tablets) once daily for a total of 3 days  Por favor, tome la prednisona 20 mg (2 comprimidos) todos los das con el desayuno durante los prximos 3 [address]  [address]  Inject 15 units of the Lantus into the skin once daily  Inyecte 15 unidades de Lantus en la piel una vez al da  Please check your blood glucose levels at least 3 times a day  Por favor, compruebe sus niveles de glucosa en sangre al menos 3 veces al da  Continue to take your diabetes medicine (metformin, Glucotrol, Januvia) as prescribed by your doctor.  Contine tomando su medicamento para la diabetes (metformina, Glucotrol, Januvia) segn lo prescrito por su mdico.

## 2022-03-18 NOTE — ED Provider Notes (Signed)
[facility]    CSN: [id number] Arrival date & time: 03/18/22  1235     History   Chief Complaint Chief Complaint  Patient presents with   Allergic Reaction    HPI Alexandra Aguirre is a 46 y.o. female.  Language interpreter used for this visit. Presents with 3-day history of poison ivy rash.  Patient was gardening and did not realize she was working with poison ivy.  Developed red and itchy rash to the face, bilateral arms, chest.  Has tried topical hydrocortisone cream without much relief. Denies shortness of breath or trouble breathing, no tongue or mouth swelling.  Denies abdominal pain, vomiting/diarrhea, fevers.   She does have a history of diabetes.  She takes daily metformin and glipizide.  Does not check her blood sugars at home.  Did not take her medicines this morning. Has not eaten today. Her PCP just added a third medicine, Januvia, which she picked up today. Used to take Lantus but stopped due to pregnancy. Never started again after miscarriage.   Past Medical History:  Diagnosis Date   Abnormal Pap smear of cervix 09/24/2013   AGUS   Acute non-recurrent maxillary sinusitis 11/01/2018   Acute tonsillitis 11/01/2018   Asthma    Diabetes mellitus without complication ([place])    Environmental allergies    GERD (gastroesophageal reflux disease)    Non-recurrent acute suppurative otitis media of left ear without spontaneous rupture of tympanic membrane 11/01/2018    Patient Active Problem List   Diagnosis Date Noted   Grief at loss of child 02/17/2022   Seasonal allergies 02/17/2022   Diabetic polyneuropathy associated with type 2 diabetes mellitus ([place]) 02/17/2022   Boil of trunk 10/06/2021   Amenorrhea 10/06/2021   Bacterial vaginitis 08/27/2020   POP-Q stage 2 cystocele 09/20/2018   Fatty liver 06/14/2018   Hyperlipidemia 04/04/2018   Dental caries 06/21/2017   Vitamin D deficiency 03/01/2017   Type 2 diabetes mellitus with complication, without  long-term current use of insulin ([place]) 10/12/2016   Gastroesophageal reflux disease 10/12/2016   Generalized anxiety disorder 10/12/2016   Asthma 01/15/2014   Vertigo 11/25/2013   Pedal edema 09/30/2013   Migraine headache 09/30/2013   Gastritis and gastroduodenitis 09/30/2013   Back pain 09/30/2013    Past Surgical History:  Procedure Laterality Date   CERVICAL BIOPSY  W/ LOOP ELECTRODE EXCISION     removal of cyst Right 2011    OB History     Gravida  6   Para  5   Term  5   Preterm      AB  1   Living  4      SAB  1   IAB      Ectopic      Multiple      Live Births  5            Home Medications    Prior to Admission medications   Medication Sig Start Date End Date Taking? Authorizing Provider  predniSONE (DELTASONE) 10 MG tablet Take 2 tablets (20 mg total) by mouth daily with breakfast for 3 days. 03/18/22 03/21/22 Yes Adyn Hoes, Wells Guiles, PA-C  atorvastatin (LIPITOR) 80 MG tablet Take 1 tablet (80 mg total) by mouth daily. 02/17/22   Mayers, Cari S, PA-C  Blood Glucose Monitoring Suppl (TRUE METRIX METER) w/Device KIT Use as directed to check blood sugar twice daily. 02/17/22 03/19/22  Mayers, Cari S, PA-C  cetirizine (ZYRTEC) 10 MG tablet Take 1 tablet (10  mg total) by mouth daily. 02/17/22   Mayers, Cari S, PA-C  gabapentin (NEURONTIN) 300 MG capsule Take 1 capsule (300 mg total) by mouth at bedtime. 02/17/22   Mayers, Cari S, PA-C  glipiZIDE (GLUCOTROL) 10 MG tablet TAKE 1 TABLET (10 MG TOTAL) BY MOUTH 2 (TWO) TIMES DAILY BEFORE A MEAL. Patient taking differently: Take 10 mg by mouth 2 (two) times daily before a meal. 10/12/20 10/12/21  Charlott Rakes, MD  glucose blood (TRUE METRIX BLOOD GLUCOSE TEST) test strip Use as directed to check blood sugar twice daily. 02/17/22   Mayers, Cari S, PA-C  insulin glargine (LANTUS SOLOSTAR) 100 UNIT/ML Solostar Pen Inject 15 Units into the skin daily. 03/18/22   Mitzi Lilja, Wells Guiles, PA-C  Insulin Pen Needle (PEN NEEDLES 31GX5/16")  31G X 8 MM MISC 1 Device by Does not apply route 5 (five) times daily. 11/03/20   Clarnce Flock, MD  metFORMIN (GLUCOPHAGE) 500 MG tablet Take orally 3 tabs (1500 mg) in the morning and 2 tabs (1000 mg) in the evening 02/17/22   Mayers, Cari S, PA-C  pantoprazole (PROTONIX) 40 MG tablet TAKE 1 TABLET (40 MG TOTAL) BY MOUTH DAILY. 02/17/22   Mayers, Cari S, PA-C  sitaGLIPtin (JANUVIA) 100 MG tablet TAKE 1 TABLET (100 MG TOTAL) BY MOUTH DAILY. 02/17/22 02/17/23  Mayers, Cari S, PA-C  TRUEplus Lancets 28G MISC Use as directed to check blood sugar twice daily. 02/17/22   Mayers, Loraine Grip, PA-C    Family History Family History  Problem Relation Age of Onset   Cancer Mother        cervical/ovarian   Diabetes Mother    Diabetes Maternal Uncle    Diabetes Brother    Diabetes Maternal Uncle    Diabetes Maternal Uncle     Social History Social History   Tobacco Use   Smoking status: Never   Smokeless tobacco: Never  Vaping Use   Vaping Use: Never used  Substance Use Topics   Alcohol use: No   Drug use: No     Allergies   Fruit & vegetable daily [nutritional supplements]   Review of Systems Review of Systems  Per HPI  Physical Exam Triage Vital Signs ED Triage Vitals  Enc Vitals Group     BP 03/18/22 1351 108/71     Pulse Rate 03/18/22 1351 80     Resp 03/18/22 1351 18     Temp 03/18/22 1351 98.2 F (36.8 C)     Temp src --      SpO2 03/18/22 1351 92 %     Weight --      Height --      Head Circumference --      Peak Flow --      Pain Score 03/18/22 1350 0     Pain Loc --      Pain Edu? --      Excl. in [place]? --    No data found.  Updated Vital Signs BP 108/71   Pulse 80   Temp 98.2 F (36.8 C)   Resp 18   LMP 01/31/2022 (Approximate)   SpO2 92%    Physical Exam Vitals and nursing note reviewed.  Constitutional:      General: She is not in acute distress.    Appearance: Normal appearance.  HENT:     Mouth/Throat:     Mouth: Mucous membranes are moist.      Pharynx: Oropharynx is clear. No posterior oropharyngeal erythema.  Eyes:  Conjunctiva/sclera: Conjunctivae normal.     Pupils: Pupils are equal, round, and reactive to light.  Cardiovascular:     Rate and Rhythm: Normal rate and regular rhythm.     Pulses: Normal pulses.     Heart sounds: Normal heart sounds.  Pulmonary:     Effort: Pulmonary effort is normal. No respiratory distress.     Breath sounds: Normal breath sounds. No wheezing.  Abdominal:     General: Bowel sounds are normal.     Tenderness: There is no abdominal tenderness.  Musculoskeletal:        General: Normal range of motion.     Cervical back: Normal range of motion.  Skin:    Findings: Rash present.     Comments: Erythematous rash to face, bilat arms, chest. Consistent with poison ivy  Neurological:     Mental Status: She is alert and oriented to person, place, and time.     UC Treatments / Results  Labs (all labs ordered are listed, but only abnormal results are displayed) Labs Reviewed  CBG MONITORING, ED - Abnormal; Notable for the following components:      Result Value   Glucose-Capillary 267 (*)    All other components within normal limits  POCT URINALYSIS DIPSTICK, ED / UC - Abnormal; Notable for the following components:   Ketones, ur TRACE (*)    pH 8.5 (*)    All other components within normal limits    EKG  Radiology No results found.  Procedures Procedures  Medications Ordered in UC Medications  methylPREDNISolone sodium succinate (SOLU-MEDROL) 125 mg/2 mL injection 60 mg (60 mg Intramuscular Given 03/18/22 1557)    Initial Impression / Assessment and Plan / UC Course  I have reviewed the triage vital signs and the nursing notes.  Pertinent labs & imaging results that were available during my care of the patient were reviewed by me and considered in my medical decision making (see chart for details).  Urinalysis with trace ketones. Glucose 267. She is fasting today.   Long  discussion regarding diabetes management and the possibility of increased blood sugars with steroid use. Patient voices understanding.  Solu-Medrol dose given in clinic for rash and itching.  She understands the steroid will increase her already elevated blood sugar.  She will restart her daily Lantus 15 units..  We discussed how to use this medicine.  She understands to check her blood sugars at least 3 times daily and continue her other diabetes medications.  Additionally 20 mg of prednisone daily for the next 3 days.  Patient is instructed to go to the emergency department if her poison ivy symptoms do not improve or if her blood sugar is very elevated.  She will follow-up with her primary care regarding her diabetes management. Avoid high carbohydrate foods. Return precautions discussed. Patient agrees to plan and is discharged in stable condition.  Final Clinical Impressions(s) / UC Diagnoses   Final diagnoses:  Allergic reaction, initial encounter  Hyperglycemia     Discharge Instructions      Please take the prednisone 20 mg (two tablets) once daily for a total of 3 days  Por favor, tome la prednisona 20 mg (2 comprimidos) todos los das con el desayuno durante los prximos 3 das.  Inject 15 units of the Lantus into the skin once daily  Inyecte 15 unidades de Lantus en la piel una vez al da  Please check your blood glucose levels at least 3 times a day  Por  favor, compruebe sus niveles de glucosa en sangre al menos 3 veces al da  Continue to take your diabetes medicine (metformin, Glucotrol, Januvia) as prescribed by your doctor.  Contine tomando su medicamento para la diabetes (metformina, Glucotrol, Januvia) segn lo prescrito por su mdico.     ED Prescriptions     Medication Sig Dispense Auth. Provider   insulin glargine (LANTUS SOLOSTAR) 100 UNIT/ML Solostar Pen Inject 15 Units into the skin daily. 15 mL Andi Layfield, PA-C   predniSONE (DELTASONE) 10 MG tablet  Take 2 tablets (20 mg total) by mouth daily with breakfast for 3 days. 6 tablet Shevy Yaney, Wells Guiles, PA-C      PDMP not reviewed this encounter.   Georgean Spainhower, Wells Guiles, [place] 03/18/22 0923

## 2022-04-04 ENCOUNTER — Ambulatory Visit (HOSPITAL_COMMUNITY)
Admission: EM | Admit: 2022-04-04 | Discharge: 2022-04-04 | Disposition: A | Discharge status: Home / Self Care | Payer: No Typology Code available for payment source | Attending: Physician Assistant | Admitting: Physician Assistant

## 2022-04-04 ENCOUNTER — Ambulatory Visit: Payer: Self-pay

## 2022-04-04 ENCOUNTER — Other Ambulatory Visit: Payer: Self-pay

## 2022-04-04 ENCOUNTER — Encounter (HOSPITAL_COMMUNITY): Payer: Self-pay

## 2022-04-04 DIAGNOSIS — N644 Mastodynia: Secondary | ICD-10-CM

## 2022-04-04 DIAGNOSIS — L0231 Cutaneous abscess of buttock: Secondary | ICD-10-CM

## 2022-04-04 MED ORDER — LIDOCAINE HCL (PF) 1 % IJ SOLN
INTRAMUSCULAR | Status: AC
  Filled 2022-04-04: qty 2

## 2022-04-04 MED ORDER — SULFAMETHOXAZOLE-TRIMETHOPRIM 800-160 MG PO TABS
1.0000 | ORAL_TABLET | Freq: Two times a day (BID) | ORAL | 0 refills | Status: AC
  Filled 2022-04-04: qty 14, 7d supply, fill #0

## 2022-04-04 MED ORDER — IBUPROFEN 600 MG PO TABS
600.0000 mg | ORAL_TABLET | Freq: Four times a day (QID) | ORAL | 0 refills | Status: DC | PRN
  Filled 2022-04-04: qty 30, 10d supply, fill #0

## 2022-04-04 NOTE — Discharge Instructions (Addendum)
To continue warm compresses frequently to help encourage drainage from the cyst. Advised to take the ibuprofen 600 mg 1 every 6-8 hours as needed for pain relief. Advised take the Bactrim DS 1 every 12 hours to treat the cyst and help encourage drainage. Advised to follow-up with PCP or return to urgent care if symptoms fail to improve.

## 2022-04-04 NOTE — ED Provider Notes (Signed)
[facility]    CSN: [id number] Arrival date & time: 04/04/22  1050      History   Chief Complaint Chief Complaint  Patient presents with   Abscess    HPI Alexandra Aguirre is a 46 y.o. female.   46 year old female presents with abscess left gluteal area and left breast pain.  History is taken through interpreter who is her son with language being [place].  Patient indicates for the past several weeks she has been having increasing pain and tenderness from a cyst that has developed on the lower left butt cheek.  Patient indicates the area has become red, tender and has drained intermittently.  Patient relates she is having pain when she sits.  She denies fever or chills.  Patient also indicates that she is having some left breast tenderness and intermittent tingling type pains.  Patient describes it as intermittent needles in the breast.  Patient has not had any trauma to the breast, and no unusual nipple drainage to the area.  Patient has taken Tylenol but without relief.  Patient decays she has no pain in the right breast.   Abscess   Past Medical History:  Diagnosis Date   Abnormal Pap smear of cervix 09/24/2013   AGUS   Acute non-recurrent maxillary sinusitis 11/01/2018   Acute tonsillitis 11/01/2018   Asthma    Diabetes mellitus without complication ([place])    Environmental allergies    GERD (gastroesophageal reflux disease)    Non-recurrent acute suppurative otitis media of left ear without spontaneous rupture of tympanic membrane 11/01/2018    Patient Active Problem List   Diagnosis Date Noted   Grief at loss of child 02/17/2022   Seasonal allergies 02/17/2022   Diabetic polyneuropathy associated with type 2 diabetes mellitus ([place]) 02/17/2022   Boil of trunk 10/06/2021   Amenorrhea 10/06/2021   Bacterial vaginitis 08/27/2020   POP-Q stage 2 cystocele 09/20/2018   Fatty liver 06/14/2018   Hyperlipidemia 04/04/2018   Dental caries 06/21/2017   Vitamin D  deficiency 03/01/2017   Type 2 diabetes mellitus with complication, without long-term current use of insulin ([place]) 10/12/2016   Gastroesophageal reflux disease 10/12/2016   Generalized anxiety disorder 10/12/2016   Asthma 01/15/2014   Vertigo 11/25/2013   Pedal edema 09/30/2013   Migraine headache 09/30/2013   Gastritis and gastroduodenitis 09/30/2013   Back pain 09/30/2013    Past Surgical History:  Procedure Laterality Date   CERVICAL BIOPSY  W/ LOOP ELECTRODE EXCISION     removal of cyst Right 2011    OB History     Gravida  6   Para  5   Term  5   Preterm      AB  1   Living  4      SAB  1   IAB      Ectopic      Multiple      Live Births  5            Home Medications    Prior to Admission medications   Medication Sig Start Date End Date Taking? Authorizing Provider  ibuprofen (ADVIL) 600 MG tablet Take 1 tablet (600 mg total) by mouth every 6 (six) hours as needed. To reduce pain. 04/04/22  Yes Nyoka Lint, PA-C  sulfamethoxazole-trimethoprim (BACTRIM DS) 800-160 MG tablet Take 1 tablet by mouth 2 (two) times daily for 7 days. 04/04/22 04/11/22 Yes Nyoka Lint, PA-C  atorvastatin (LIPITOR) 80 MG tablet Take 1 tablet (80  mg total) by mouth daily. 02/17/22   Mayers, Cari S, PA-C  cetirizine (ZYRTEC) 10 MG tablet Take 1 tablet (10 mg total) by mouth daily. 02/17/22   Mayers, Cari S, PA-C  gabapentin (NEURONTIN) 300 MG capsule Take 1 capsule (300 mg total) by mouth at bedtime. 02/17/22   Mayers, Cari S, PA-C  glipiZIDE (GLUCOTROL) 10 MG tablet TAKE 1 TABLET (10 MG TOTAL) BY MOUTH 2 (TWO) TIMES DAILY BEFORE A MEAL. Patient taking differently: Take 10 mg by mouth 2 (two) times daily before a meal. 10/12/20 10/12/21  Hoy Register, MD  glucose blood (TRUE METRIX BLOOD GLUCOSE TEST) test strip Use as directed to check blood sugar twice daily. 02/17/22   Mayers, Cari S, PA-C  insulin glargine (LANTUS SOLOSTAR) 100 UNIT/ML Solostar Pen Inject 15 Units into the skin  daily. 03/18/22   Rising, Lurena Joiner, PA-C  Insulin Pen Needle (PEN NEEDLES 31GX5/16") 31G X 8 MM MISC 1 Device by Does not apply route 5 (five) times daily. 11/03/20   Venora Maples, MD  metFORMIN (GLUCOPHAGE) 500 MG tablet Take orally 3 tabs (1500 mg) in the morning and 2 tabs (1000 mg) in the evening 02/17/22   Mayers, Cari S, PA-C  pantoprazole (PROTONIX) 40 MG tablet TAKE 1 TABLET (40 MG TOTAL) BY MOUTH DAILY. 02/17/22   Mayers, Cari S, PA-C  sitaGLIPtin (JANUVIA) 100 MG tablet TAKE 1 TABLET (100 MG TOTAL) BY MOUTH DAILY. 02/17/22 02/17/23  Mayers, Cari S, PA-C  TRUEplus Lancets 28G MISC Use as directed to check blood sugar twice daily. 02/17/22   Mayers, Kasandra Knudsen, PA-C    Family History Family History  Problem Relation Age of Onset   Cancer Mother        cervical/ovarian   Diabetes Mother    Diabetes Maternal Uncle    Diabetes Brother    Diabetes Maternal Uncle    Diabetes Maternal Uncle     Social History Social History   Tobacco Use   Smoking status: Never   Smokeless tobacco: Never  Vaping Use   Vaping Use: Never used  Substance Use Topics   Alcohol use: No   Drug use: No     Allergies   Fruit & vegetable daily [nutritional supplements]   Review of Systems Review of Systems  Skin:  Positive for wound (left butt check cyst).     Physical Exam Triage Vital Signs ED Triage Vitals [04/04/22 1233]  Enc Vitals Group     BP 125/76     Pulse Rate 75     Resp 18     Temp 97.8 F (36.6 C)     Temp Source Oral     SpO2 98 %     Weight      Height      Head Circumference      Peak Flow      Pain Score 10     Pain Loc      Pain Edu?      Excl. in GC?    No data found.  Updated Vital Signs BP 125/76 (BP Location: Right Arm)   Pulse 75   Temp 97.8 F (36.6 C) (Oral)   Resp 18   LMP 04/03/2022   SpO2 98%   Breastfeeding No   Visual Acuity Right Eye Distance:   Left Eye Distance:   Bilateral Distance:    Right Eye Near:   Left Eye Near:    Bilateral  Near:     Physical Exam Constitutional:  Appearance: Normal appearance.  Chest:     Comments: Breast: Left breast has mild tenderness on palpation, no masses, no nipple drainage, no unusual redness present. Right breast: There is no tenderness, mass or redness on palpation. Skin:         Comments: Gluteal: There is a 2 cm cystic formation on the lower part of the left gluteal area, with mild redness associated, the center has a soft fluid feel to it.  There is no streaking. Procedure: Patient has agreed to have the area I indeed.  The area was cleaned with Betadine, anesthetized with 1% lidocaine, incision made, and a minimal amount of material expressed, but mainly blood.  The area is still solid around the center of the cyst.  Neurological:     Mental Status: She is alert.      UC Treatments / Results  Labs (all labs ordered are listed, but only abnormal results are displayed) Labs Reviewed - No data to display  EKG   Radiology No results found.  Procedures Procedures (including critical care time)  Medications Ordered in UC Medications - No data to display  Initial Impression / Assessment and Plan / UC Course  I have reviewed the triage vital signs and the nursing notes.  Pertinent labs & imaging results that were available during my care of the patient were reviewed by me and considered in my medical decision making (see chart for details).    Plan: 1.  Advised warm compresses frequently to help the cyst drain. 2.  Advised to take the Bactrim DS 1 every 12 hours until completed. 3.  Advised take ibuprofen 600 mg 1 every 8 hours to help reduce the pain from the cyst. 4.  Advised follow-up PCP or return to urgent care if symptoms fail to improve Final Clinical Impressions(s) / UC Diagnoses   Final diagnoses:  Abscess of buttock, right  Mastodynia of left breast     Discharge Instructions      To continue warm compresses frequently to help encourage  drainage from the cyst. Advised to take the ibuprofen 600 mg 1 every 6-8 hours as needed for pain relief. Advised take the Bactrim DS 1 every 12 hours to treat the cyst and help encourage drainage. Advised to follow-up with PCP or return to urgent care if symptoms fail to improve.    ED Prescriptions     Medication Sig Dispense Auth. Provider   sulfamethoxazole-trimethoprim (BACTRIM DS) 800-160 MG tablet Take 1 tablet by mouth 2 (two) times daily for 7 days. 14 tablet Ellsworth Lennox, PA-C   ibuprofen (ADVIL) 600 MG tablet Take 1 tablet (600 mg total) by mouth every 6 (six) hours as needed. To reduce pain. 30 tablet Ellsworth Lennox, PA-C      PDMP not reviewed this encounter.   Ellsworth Lennox, PA-C 04/04/22 1316

## 2022-04-04 NOTE — Telephone Encounter (Signed)
  Chief Complaint: Lump on left leg below buttocks - raised  Symptoms: lump about 3 inches across and raised 1/2 inch, chills Frequency: 10 - 12 days Pertinent Negatives: Patient denies Disposition: [] ED /[x] Urgent Care (no appt availability in office) / [] Appointment(In office/virtual)/ []  [facility]/ [] Home Care/ [] Refused Recommended Disposition /[]  Mobile Bus/ []  Follow-up with PCP Additional Notes: Pt has had a lump on her left leg for 10 -12 days. Lump is 3-4 inches across and 1/2 tall. Pt states that it is painful. Pt may have already tried antibiotics brought from . Pt also has chills. Reason for Disposition  SEVERE pain (e.g., excruciating)  Protocols used: Boil (Skin Abscess)-A-AH

## 2022-04-04 NOTE — ED Triage Notes (Signed)
Pt c/o abscess to lt upper leg/buttocks area x10 days with redness. Denies drainage.   Pt c/o lt center breast pain x3 days with tender to touch.

## 2022-06-07 ENCOUNTER — Other Ambulatory Visit: Payer: Self-pay

## 2022-06-27 ENCOUNTER — Emergency Department (HOSPITAL_COMMUNITY): Payer: No Typology Code available for payment source

## 2022-06-27 ENCOUNTER — Emergency Department (HOSPITAL_COMMUNITY)
Admission: EM | Admit: 2022-06-27 | Discharge: 2022-06-28 | Disposition: A | Discharge status: Home / Self Care | Payer: No Typology Code available for payment source | Attending: Emergency Medicine | Admitting: Emergency Medicine

## 2022-06-27 ENCOUNTER — Encounter (HOSPITAL_COMMUNITY): Payer: Self-pay | Admitting: Emergency Medicine

## 2022-06-27 DIAGNOSIS — M545 Low back pain, unspecified: Secondary | ICD-10-CM | POA: Insufficient documentation

## 2022-06-27 DIAGNOSIS — N9489 Other specified conditions associated with female genital organs and menstrual cycle: Secondary | ICD-10-CM | POA: Insufficient documentation

## 2022-06-27 DIAGNOSIS — R0789 Other chest pain: Secondary | ICD-10-CM | POA: Insufficient documentation

## 2022-06-27 DIAGNOSIS — W19XXXA Unspecified fall, initial encounter: Secondary | ICD-10-CM | POA: Insufficient documentation

## 2022-06-27 LAB — I-STAT BETA HCG BLOOD, ED (MC, WL, AP ONLY): I-stat hCG, quantitative: <5 m[IU]/mL (ref <5)

## 2022-06-27 MED ORDER — OXYCODONE-ACETAMINOPHEN 5-325 MG PO TABS
2.0000 | ORAL_TABLET | Freq: Once | ORAL | Status: AC
  Administered 2022-06-28: 2 via ORAL
  Filled 2022-06-27: qty 2

## 2022-06-27 MED ORDER — OXYCODONE-ACETAMINOPHEN 5-325 MG PO TABS
1.0000 | ORAL_TABLET | Freq: Once | ORAL | Status: AC
  Administered 2022-06-27: 1 via ORAL
  Filled 2022-06-27: qty 1

## 2022-06-27 NOTE — ED Triage Notes (Signed)
Patient states she tripped while walking backwards through a yard and fell backward onto the concrete, hitting her buttocks and head on the concrete. Patient states she has little pain in her head but significant pain in her lower back. Denies LOC.

## 2022-06-27 NOTE — ED Provider Notes (Signed)
[facility] EMERGENCY DEPARTMENT Provider Note   CSN: [id number] Arrival date & time: 06/27/22  1732     History  Chief Complaint  Patient presents with   Georgana Curio Braulio Conte is a 46 y.o. female.   Fall     45 year old female presenting to the emergency department with back pain after fall.  The patient states that she was using a backpack leaf lower while helping out her church and walking backwards and tripped over something on the ground.  The history was provided with the aid of a Spanish language telemetry interpreter.  She fell backwards and landed on her tailbone and then hit her head on the cement.  She denies any loss of consciousness.  She has been having some pain in her left chest wall.  She completed triage of right shoulder pain and lower back pain.  She additionally complains of pain in the vicinity of her tailbone.  She denies any neurologic deficits.  She is sharp and severe pain in her lower back and is unable to sit down.  She arrived GCS 15, ABC intact.  Home Medications Prior to Admission medications   Medication Sig Start Date End Date Taking? Authorizing Provider  atorvastatin (LIPITOR) 80 MG tablet Take 1 tablet (80 mg total) by mouth daily. 02/17/22   Mayers, Cari S, PA-C  cetirizine (ZYRTEC) 10 MG tablet Take 1 tablet (10 mg total) by mouth daily. 02/17/22   Mayers, Cari S, PA-C  gabapentin (NEURONTIN) 300 MG capsule Take 1 capsule (300 mg total) by mouth at bedtime. 02/17/22   Mayers, Cari S, PA-C  glipiZIDE (GLUCOTROL) 10 MG tablet TAKE 1 TABLET (10 MG TOTAL) BY MOUTH 2 (TWO) TIMES DAILY BEFORE A MEAL. Patient taking differently: Take 10 mg by mouth 2 (two) times daily before a meal. 10/12/20 10/12/21  Charlott Rakes, MD  glucose blood (TRUE METRIX BLOOD GLUCOSE TEST) test strip Use as directed to check blood sugar twice daily. 02/17/22   Mayers, Cari S, PA-C  ibuprofen (ADVIL) 600 MG tablet Take 1 tablet (600 mg total) by mouth every 6  (six) hours as needed. To reduce pain. 04/04/22   Nyoka Lint, PA-C  insulin glargine (LANTUS SOLOSTAR) 100 UNIT/ML Solostar Pen Inject 15 Units into the skin daily. 03/18/22   Rising, Wells Guiles, PA-C  Insulin Pen Needle (PEN NEEDLES 31GX5/16") 31G X 8 MM MISC 1 Device by Does not apply route 5 (five) times daily. 11/03/20   Clarnce Flock, MD  metFORMIN (GLUCOPHAGE) 500 MG tablet Take orally 3 tabs (1500 mg) in the morning and 2 tabs (1000 mg) in the evening 02/17/22   Mayers, Cari S, PA-C  pantoprazole (PROTONIX) 40 MG tablet TAKE 1 TABLET (40 MG TOTAL) BY MOUTH DAILY. 02/17/22   Mayers, Cari S, PA-C  sitaGLIPtin (JANUVIA) 100 MG tablet TAKE 1 TABLET (100 MG TOTAL) BY MOUTH DAILY. 02/17/22 02/17/23  Mayers, Cari S, PA-C  TRUEplus Lancets 28G MISC Use as directed to check blood sugar twice daily. 02/17/22   Mayers, Cari S, PA-C      Allergies    Fruit & vegetable daily [nutritional supplements]    Review of Systems   Review of Systems  All other systems reviewed and are negative.   Physical Exam Updated Vital Signs BP 109/61 (BP Location: Right Arm)   Pulse 84   Temp 98 F (36.7 C) (Oral)   Resp 18   SpO2 97%  Physical Exam Vitals and nursing note reviewed.  Constitutional:      General: She is not in acute distress.    Appearance: She is well-developed.     Comments: GCS 15, ABC intact, in obvious discomfort  HENT:     Head: Normocephalic and atraumatic.  Eyes:     Extraocular Movements: Extraocular movements intact.     Conjunctiva/sclera: Conjunctivae normal.     Pupils: Pupils are equal, round, and reactive to light.  Neck:     Comments: No midline tenderness to palpation of the cervical spine.  Range of motion intact Cardiovascular:     Rate and Rhythm: Normal rate and regular rhythm.     Heart sounds: No murmur heard. Pulmonary:     Effort: Pulmonary effort is normal. No respiratory distress.     Breath sounds: Normal breath sounds.  Chest:     Comments: Clavicles stable  nontender to AP compression.  Chest wall stable with left-sided chest wall tenderness to palpation Abdominal:     Palpations: Abdomen is soft.     Tenderness: There is no abdominal tenderness.     Comments: Pelvis stable to lateral compression  Musculoskeletal:     Cervical back: Neck supple.     Comments: No midline tenderness to palpation of the thoracic spine.  Extremities atraumatic with intact range of motion.  Tenderness to palpation of the lower lumbar spine  Skin:    General: Skin is warm and dry.  Neurological:     Mental Status: She is alert.     Comments: Cranial nerves II through XII grossly intact.  Moving all 4 extremities spontaneously.  Sensation grossly intact all 4 extremities     ED Results / Procedures / Treatments   Labs (all labs ordered are listed, but only abnormal results are displayed) Labs Reviewed  I-STAT BETA HCG BLOOD, ED ([facility], [facility], [facility] ONLY)    EKG None  Radiology DG Lumbar Spine Complete  Result Date: 06/27/2022 CLINICAL DATA:  Back pain after a fall. EXAM: LUMBAR SPINE - COMPLETE 4+ VIEW COMPARISON:  None Available. FINDINGS: Five lumbar type vertebral bodies. Normal alignment. No vertebral compression deformities. No focal bone lesions. Degenerative changes with mild disc space narrowing and small osteophyte formation at the endplates. Visualized sacrum appears intact. IMPRESSION: Mild degenerative changes. Normal alignment. No acute displaced fractures identified. Electronically Signed   By: Burman Nieves M.D.   On: 06/27/2022 21:34   DG Shoulder Right  Result Date: 06/27/2022 CLINICAL DATA:  Right shoulder pain after slip and fall injury. EXAM: RIGHT SHOULDER - 2+ VIEW COMPARISON:  None Available. FINDINGS: Mild degenerative changes in the acromioclavicular joint. No evidence of acute fracture or subluxation. No focal bone lesion or bone destruction. Bone cortex and trabecular architecture appear intact. No radiopaque soft tissue foreign bodies.  IMPRESSION: Degenerative changes in the acromioclavicular joint. No acute displaced fractures identified. No glenohumeral dislocation. Electronically Signed   By: Burman Nieves M.D.   On: 06/27/2022 21:33   DG Pelvis 1-2 Views  Result Date: 06/27/2022 CLINICAL DATA:  Tailbone pain after slip and fall injury. EXAM: PELVIS - 1-2 VIEW COMPARISON:  CT abdomen and pelvis 10/27/2018 FINDINGS: There is no evidence of pelvic fracture or diastasis. No pelvic bone lesions are seen. IMPRESSION: Negative. Electronically Signed   By: Burman Nieves M.D.   On: 06/27/2022 21:32    Procedures Procedures    Medications Ordered in ED Medications  oxyCODONE-acetaminophen (PERCOCET/ROXICET) 5-325 MG per tablet 2 tablet (has no administration in time range)  oxyCODONE-acetaminophen (PERCOCET/ROXICET) 5-325  MG per tablet 1 tablet (1 tablet Oral Given 06/27/22 1750)    ED Course/ Medical Decision Making/ A&P                           Medical Decision Making Amount and/or Complexity of Data Reviewed Radiology: ordered.  Risk Prescription drug management.    46 year old female presenting to the emergency department with back pain after fall.  The patient states that she was using a backpack leaf lower while helping out her church and walking backwards and tripped over something on the ground.  The history was provided with the aid of a Spanish language telemetry interpreter.  She fell backwards and landed on her tailbone and then hit her head on the cement.  She denies any loss of consciousness.  She has been having some pain in her left chest wall.  She completed triage of right shoulder pain and lower back pain.  She additionally complains of pain in the vicinity of her tailbone.  She denies any neurologic deficits.  She is sharp and severe pain in her lower back and is unable to sit down.  She arrived GCS 15, ABC intact.  On arrival, the patient was vitally stable, initial x-ray imaging of the lumbar  spine, right shoulder and pelvis negative.  She was significant tenderness to palpation of the lower lumbar spine.  Suspect likely occult fracture seen on initial imaging.  Will obtain CT of the lumbar spine and pelvis and reassess.  Patient neck cleared by Nexus criteria, head cleared by [place] CT head rule.  Additionally, chest x-ray ordered to further evaluate.  Patient was administered Percocet for pain control.  Disposition pending results of imaging.  Signout given to Dr. Wilkie Aye at 0000.   Final Clinical Impression(s) / ED Diagnoses Final diagnoses:  Fall, initial encounter  Acute midline low back pain, unspecified whether sciatica present    Rx / DC Orders ED Discharge Orders     None         Ernie Avena, MD 06/27/22 2326

## 2022-06-27 NOTE — ED Provider Triage Note (Signed)
Emergency Medicine Provider Triage Evaluation Note  Alexandra Aguirre , a 46 y.o. female  was evaluated in triage.  Pt complains of lower back pain after a fall. Patient was using a backpack leaf blower and fell backwards, hitting her bottom and her head on the cement. Did not lose consciousness. Is complaining of right shoulder and lower back pain. No numbness, weakness or incontinence.   Review of Systems  As above  Physical Exam  BP (!) 141/90   Pulse 96   Temp 98 F (36.7 C)   Resp (!) 22   SpO2 98%  Gen:   Awake, no distress   Resp:  Normal effort  MSK:   Moves extremities without difficulty  Other:  Pt standing holding her lower back, does not want to sit down  Medical Decision Making  Medically screening exam initiated at 5:46 PM.  Appropriate orders placed.  Aibhlinn Kalmar was informed that the remainder of the evaluation will be completed by another provider, this initial triage assessment does not replace that evaluation, and the importance of remaining in the ED until their evaluation is complete.  Given pain medicine and images ordered   Aariz Maish T, PA-C 06/27/22 1748

## 2022-06-28 ENCOUNTER — Emergency Department (HOSPITAL_COMMUNITY): Payer: No Typology Code available for payment source

## 2022-06-28 MED ORDER — NAPROXEN 500 MG PO TABS
500.0000 mg | ORAL_TABLET | Freq: Two times a day (BID) | ORAL | 0 refills | Status: DC
  Filled 2022-06-29: qty 30, 15d supply, fill #0

## 2022-06-28 NOTE — Discharge Instructions (Signed)
Fuiste visto hoy despus de Engineer, manufacturing. Sus radiografas no muestran Geneticist, molecular. Es posible que tenga un bulto en el disco inferior. Tome naproxeno para Conservation officer, historic buildings.

## 2022-06-28 NOTE — ED Notes (Signed)
Patient transported to CT 

## 2022-06-28 NOTE — ED Provider Notes (Signed)
Patient signed out pending imaging.  Imaging reviewed and negative for acute fracture.  She does have some suggestion of disc bulge.  No sciatica symptoms but does have lower back pain.  She was advised of this.  Recommend naproxen twice daily for pain.   Merryl Hacker, MD 06/28/22 [phone]

## 2022-06-29 ENCOUNTER — Other Ambulatory Visit: Payer: Self-pay

## 2022-06-30 ENCOUNTER — Telehealth (HOSPITAL_COMMUNITY): Payer: Self-pay

## 2022-06-30 NOTE — Telephone Encounter (Signed)
Via interpreter # [phone], Returned pt.s call.  She called to have clarification on her discharge papers.  Pt. Reports that her discharge papers were reviewed in English and patient does not speak English,  All questions answered and pt verbalized understanding.

## 2022-09-21 ENCOUNTER — Ambulatory Visit: Payer: Self-pay | Admitting: *Deleted

## 2022-09-21 NOTE — Telephone Encounter (Signed)
Interpreter: [phone]  Chief Complaint: cough- blood in sputum Symptoms: cough, SOB, congestion, chest pain, fever possible Frequency: 1 week Pertinent Negatives: Patient denies trouble swallowing Disposition: [x] ED /[] Urgent Care (no appt availability in office) / [] Appointment(In office/virtual)/ []  [place] [facility]/ [] Home Care/ [] Refused Recommended Disposition /[]  Mobile Bus/ []  Follow-up with PCP Additional Notes: Advised ED due to symptoms and asthma hx  Reason for Disposition  Chest pain  (Exception: MILD central chest pain, present only when coughing.)  Answer Assessment - Initial Assessment Questions 1. ONSET: "When did the cough begin?"      Cough started 7 days ago 2. SEVERITY: "How bad is the cough today?"      Non stop 3. SPUTUM: "Describe the color of your sputum" (none, dry cough; clear, white, yellow, green)     yellowish 4. HEMOPTYSIS: "Are you coughing up any blood?" If so ask: "How much?" (flecks, streaks, tablespoons, etc.)     Blood traces 5. DIFFICULTY BREATHING: "Are you having difficulty breathing?" If Yes, ask: "How bad is it?" (e.g., mild, moderate, severe)    - MILD: No SOB at rest, mild SOB with walking, speaks normally in sentences, can lie down, no retractions, pulse < 100.    - MODERATE: SOB at rest, SOB with minimal exertion and prefers to sit, cannot lie down flat, speaks in phrases, mild retractions, audible wheezing, pulse 100-120.    - SEVERE: Very SOB at rest, speaks in single words, struggling to breathe, sitting hunched forward, retractions, pulse > 120      Tired, SOB - dizzy 6. FEVER: "Do you have a fever?" If Yes, ask: "What is your temperature, how was it measured, and when did it start?"     chills 7. CARDIAC HISTORY: "Do you have any history of heart disease?" (e.g., heart attack, congestive heart failure)      na 8. LUNG HISTORY: "Do you have any history of lung disease?"  (e.g., pulmonary embolus, asthma,  emphysema)     asthma  10. OTHER SYMPTOMS: "Do you have any other symptoms?" (e.g., runny nose, wheezing, chest pain)       Chest pain,no asthma medication , congestion, urinary incontinence with cough  Protocols used: Cough - Acute Productive-A-AH

## 2022-09-22 ENCOUNTER — Emergency Department (HOSPITAL_COMMUNITY)
Admission: EM | Admit: 2022-09-22 | Discharge: 2022-09-22 | Disposition: A | Discharge status: Home / Self Care | Payer: No Typology Code available for payment source | Attending: Emergency Medicine | Admitting: Emergency Medicine

## 2022-09-22 ENCOUNTER — Other Ambulatory Visit: Payer: Self-pay

## 2022-09-22 ENCOUNTER — Emergency Department (HOSPITAL_COMMUNITY): Payer: No Typology Code available for payment source

## 2022-09-22 DIAGNOSIS — Z20822 Contact with and (suspected) exposure to covid-19: Secondary | ICD-10-CM | POA: Insufficient documentation

## 2022-09-22 DIAGNOSIS — Z7984 Long term (current) use of oral hypoglycemic drugs: Secondary | ICD-10-CM | POA: Insufficient documentation

## 2022-09-22 DIAGNOSIS — E119 Type 2 diabetes mellitus without complications: Secondary | ICD-10-CM | POA: Insufficient documentation

## 2022-09-22 DIAGNOSIS — J111 Influenza due to unidentified influenza virus with other respiratory manifestations: Secondary | ICD-10-CM

## 2022-09-22 DIAGNOSIS — R0789 Other chest pain: Secondary | ICD-10-CM

## 2022-09-22 DIAGNOSIS — Z794 Long term (current) use of insulin: Secondary | ICD-10-CM | POA: Insufficient documentation

## 2022-09-22 DIAGNOSIS — N9489 Other specified conditions associated with female genital organs and menstrual cycle: Secondary | ICD-10-CM | POA: Insufficient documentation

## 2022-09-22 DIAGNOSIS — J101 Influenza due to other identified influenza virus with other respiratory manifestations: Secondary | ICD-10-CM | POA: Insufficient documentation

## 2022-09-22 LAB — COMPREHENSIVE METABOLIC PANEL
ALT: 24 U/L (ref 0–44)
AST: 20 U/L (ref 15–41)
Albumin: 3.5 g/dL (ref 3.5–5.0)
Alkaline Phosphatase: 129 U/L — ABNORMAL HIGH (ref 38–126)
Anion gap: 8 (ref 5–15)
BUN: 7 mg/dL (ref 6–20)
CO2: 26 mmol/L (ref 22–32)
Calcium: 8.6 mg/dL — ABNORMAL LOW (ref 8.9–10.3)
Chloride: 101 mmol/L (ref 98–111)
Creatinine, Ser: 0.46 mg/dL (ref 0.44–1.00)
GFR, Estimated: >60 mL/min (ref >60)
Glucose, Bld: 294 mg/dL — ABNORMAL HIGH (ref 70–99)
Potassium: 3.9 mmol/L (ref 3.5–5.1)
Sodium: 135 mmol/L (ref 135–145)
Total Bilirubin: 0.4 mg/dL (ref 0.3–1.2)
Total Protein: 7.4 g/dL (ref 6.5–8.1)

## 2022-09-22 LAB — CBC WITH DIFFERENTIAL/PLATELET
Abs Immature Granulocytes: 0.01 10*3/uL (ref 0.00–0.07)
Basophils Absolute: 0 10*3/uL (ref 0.0–0.1)
Basophils Relative: 1 %
Eosinophils Absolute: 0.3 10*3/uL (ref 0.0–0.5)
Eosinophils Relative: 6 %
HCT: 42.8 % (ref 36.0–46.0)
Hemoglobin: 14.6 g/dL (ref 12.0–15.0)
Immature Granulocytes: 0 %
Lymphocytes Relative: 43 %
Lymphs Abs: 1.9 10*3/uL (ref 0.7–4.0)
MCH: 30.1 pg (ref 26.0–34.0)
MCHC: 34.1 g/dL (ref 30.0–36.0)
MCV: 88.2 fL (ref 80.0–100.0)
Monocytes Absolute: 0.5 10*3/uL (ref 0.1–1.0)
Monocytes Relative: 11 %
Neutro Abs: 1.7 10*3/uL (ref 1.7–7.7)
Neutrophils Relative %: 39 %
Platelets: 284 10*3/uL (ref 150–400)
RBC: 4.85 MIL/uL (ref 3.87–5.11)
RDW: 12.1 % (ref 11.5–15.5)
WBC: 4.3 10*3/uL (ref 4.0–10.5)
nRBC: 0 % (ref 0.0–0.2)

## 2022-09-22 LAB — RESP PANEL BY RT-PCR (RSV, FLU A&B, COVID)  RVPGX2
Influenza A by PCR: POSITIVE — AB
Influenza B by PCR: NEGATIVE
Resp Syncytial Virus by PCR: NEGATIVE
SARS Coronavirus 2 by RT PCR: NEGATIVE

## 2022-09-22 LAB — URINALYSIS, ROUTINE W REFLEX MICROSCOPIC
Bilirubin Urine: NEGATIVE
Glucose, UA: >=500 mg/dL — AB
Hgb urine dipstick: NEGATIVE
Ketones, ur: NEGATIVE mg/dL
Leukocytes,Ua: NEGATIVE
Nitrite: NEGATIVE
Protein, ur: NEGATIVE mg/dL
Specific Gravity, Urine: 1.033 — ABNORMAL HIGH (ref 1.005–1.030)
pH: 6 (ref 5.0–8.0)

## 2022-09-22 LAB — D-DIMER, QUANTITATIVE: D-Dimer, Quant: <0.27 ug/mL-FEU (ref 0.00–0.50)

## 2022-09-22 LAB — I-STAT BETA HCG BLOOD, ED (MC, WL, AP ONLY): I-stat hCG, quantitative: <5 m[IU]/mL (ref <5)

## 2022-09-22 LAB — CBG MONITORING, ED: Glucose-Capillary: 281 mg/dL — ABNORMAL HIGH (ref 70–99)

## 2022-09-22 LAB — TROPONIN I (HIGH SENSITIVITY): Troponin I (High Sensitivity): <2 ng/L (ref <18)

## 2022-09-22 MED ORDER — FLUCONAZOLE 150 MG PO TABS
150.0000 mg | ORAL_TABLET | Freq: Every day | ORAL | 0 refills | Status: DC
  Filled 2022-09-22: qty 1, 1d supply, fill #0

## 2022-09-22 MED ORDER — ACETAMINOPHEN 325 MG PO TABS
ORAL_TABLET | ORAL | Status: AC
  Administered 2022-09-22: 650 mg via ORAL
  Filled 2022-09-22: qty 2

## 2022-09-22 MED ORDER — BENZONATATE 100 MG PO CAPS
100.0000 mg | ORAL_CAPSULE | Freq: Three times a day (TID) | ORAL | 0 refills | Status: DC
  Filled 2022-09-22: qty 21, 7d supply, fill #0

## 2022-09-22 MED ORDER — ACETAMINOPHEN 325 MG PO TABS
650.0000 mg | ORAL_TABLET | Freq: Once | ORAL | Status: AC
Start: 2022-09-22 — End: 2022-09-22

## 2022-09-22 NOTE — ED Triage Notes (Signed)
Pt via POV c/o SOB, cough, chills, and coughing up yellow and blood tinged mucous. S/s ongoing for past 8 days. No resp distress in triage.

## 2022-09-22 NOTE — Progress Notes (Signed)
   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 Hallway for "Patient Weeping - 2 Recent Family Deaths." Alexandra Aguirre:  47-Year-Old-Latin American Female. Patient speak and understands English, but Alexandra Aguirre was being utilized by Physician and Nurse, therefore Chaplain continued with it use Forde Dandy [phone]). Medical Examination and Treatment was conducted in [place]. Patient was comfortable for [place] to conduct our conversation in hallway and deferred moving to private room.   Alexandra Aguirre shared that her teenage son had recently committed violent suicide in her presence, by gun shot to head in front of her and other teenage son. Her son died instantly while she was holding him in her arms. Alexandra Aguirre's mother has also just recently died also.   In recent her second teenage son has been dealing with acute depression and has threatened suicide. Her husband is also experiencing significant depression and demonstrating suicide ideations - He has a rope and has threatened hanging himself. He recently cut himself. Both have been prescribed antidepressants but neither is compliant with taking them.  Patient shared her pain, loss, grief, and fear. Patient herself denies suicidal thoughts. Patient states that she is NOT  in harm's way or feel any  threat of either hurting her.   Chaplain advised patient to take suicidal threats made by husband and son serious. Especially since one son has already died. Chaplain Emphasized Importance of her Contacting 911 if she believes either is a threat of harming her or themselves. Chaplain Emphasized Urgency of Getting all three of them into Grief Counseling. Chaplain Emphasized the Importance of acknowledging one another's pain and talking about their grief. Chaplain Recommended healthy lifestyle choices that she should make for herself and encouraged her to assist family members to do same such as: adequate sleep, healthy diet, water consumption, exercise... Chaplain provided patient with referral to [place] [facility] at [facility].  Patient stated that our conversation was helpful and that she felt heard and very supported.  Patient was discharged to home by physician and nurse.  [address] [place], [place]., [phone]

## 2022-09-22 NOTE — ED Notes (Signed)
Patient revealed to this RN that her son had committed suicide 2 years ago and her mother died 45 months ago.  She also informed staff that her husband is now having concerning thoughts.  Chaplain has been at the bedside talking with patient using the interpreter phone.  Patient verbalized understanding of discharge instructions and reasons to return to the ED

## 2022-09-22 NOTE — ED Provider Notes (Signed)
[facility] EMERGENCY DEPARTMENT Provider Note   CSN: [id number] Arrival date & time: 09/22/22  0444     History  Chief Complaint  Patient presents with   Cough    Alexandra Aguirre is a 47 y.o. female.  Level 5 caveat for language barrier.  Translator is used.  Patient presents with a 8-day history of body aches, chills, cough, chest pain with coughing and abdominal pain with coughing.  States she is most concerned because over the past 3 days she has been coughing up yellow with some blood streaks and believes that she was coughing up blood.  Does have a history of asthma.  Is also diabetic.  Denies any increased work of breathing.  Does not think that she has had a fever.  Her children at home have been sick.  Her chest only hurts when she coughs.  Her chest pain is not exertional or pleuritic.  She has had congestion, runny nose, sore throat, headache, body aches, chills and chest pain with coughing.  Has been using TheraFlu at home without relief.  Denies any nausea, vomiting or diarrhea.  Denies any pain with urination or blood in the urine but does have some vaginal itching. Denies any cardiac history.  Denies any history of blood clots.  Denies any recent travel.  The history is provided by the patient. The history is limited by a language barrier. A language interpreter was used.  Cough Associated symptoms: chest pain, headaches, myalgias, rhinorrhea, shortness of breath and sore throat   Associated symptoms: no rash        Home Medications Prior to Admission medications   Medication Sig Start Date End Date Taking? Authorizing Provider  atorvastatin (LIPITOR) 80 MG tablet Take 1 tablet (80 mg total) by mouth daily. 02/17/22   Mayers, Cari S, PA-C  cetirizine (ZYRTEC) 10 MG tablet Take 1 tablet (10 mg total) by mouth daily. 02/17/22   Mayers, Cari S, PA-C  gabapentin (NEURONTIN) 300 MG capsule Take 1 capsule (300 mg total) by mouth at bedtime. 02/17/22   Mayers,  Cari S, PA-C  glipiZIDE (GLUCOTROL) 10 MG tablet TAKE 1 TABLET (10 MG TOTAL) BY MOUTH 2 (TWO) TIMES DAILY BEFORE A MEAL. Patient taking differently: Take 10 mg by mouth 2 (two) times daily before a meal. 10/12/20 10/12/21  Hoy Register, MD  glucose blood (TRUE METRIX BLOOD GLUCOSE TEST) test strip Use as directed to check blood sugar twice daily. 02/17/22   Mayers, Cari S, PA-C  ibuprofen (ADVIL) 600 MG tablet Take 1 tablet (600 mg total) by mouth every 6 (six) hours as needed. To reduce pain. 04/04/22   Ellsworth Lennox, PA-C  insulin glargine (LANTUS SOLOSTAR) 100 UNIT/ML Solostar Pen Inject 15 Units into the skin daily. 03/18/22   Rising, Lurena Joiner, PA-C  Insulin Pen Needle (PEN NEEDLES 31GX5/16") 31G X 8 MM MISC 1 Device by Does not apply route 5 (five) times daily. 11/03/20   Venora Maples, MD  metFORMIN (GLUCOPHAGE) 500 MG tablet Take orally 3 tabs (1500 mg) in the morning and 2 tabs (1000 mg) in the evening 02/17/22   Mayers, Cari S, PA-C  naproxen (NAPROSYN) 500 MG tablet Take 1 tablet (500 mg total) by mouth 2 (two) times daily. 06/28/22   Horton, Mayer Masker, MD  pantoprazole (PROTONIX) 40 MG tablet TAKE 1 TABLET (40 MG TOTAL) BY MOUTH DAILY. 02/17/22   Mayers, Cari S, PA-C  sitaGLIPtin (JANUVIA) 100 MG tablet TAKE 1 TABLET (100 MG TOTAL) BY  MOUTH DAILY. 02/17/22 02/17/23  Mayers, Cari S, PA-C  TRUEplus Lancets 28G MISC Use as directed to check blood sugar twice daily. 02/17/22   Mayers, Cari S, PA-C      Allergies    Fruit & vegetable daily [nutritional supplements]    Review of Systems   Review of Systems  Constitutional:  Positive for activity change, appetite change and fatigue.  HENT:  Positive for congestion, rhinorrhea and sore throat.   Eyes:  Negative for visual disturbance.  Respiratory:  Positive for cough, chest tightness and shortness of breath.   Cardiovascular:  Positive for chest pain.  Gastrointestinal:  Negative for abdominal pain, nausea and vomiting.  Genitourinary:  Negative  for dysuria and hematuria.  Musculoskeletal:  Positive for arthralgias and myalgias.  Skin:  Negative for rash.  Neurological:  Positive for weakness and headaches. Negative for dizziness.   all other systems are negative except as noted in the HPI and PMH.    Physical Exam Updated Vital Signs BP 122/74 (BP Location: Right Arm)   Pulse 84   Temp 98 F (36.7 C)   Resp 20   Ht 5\' 2"  (1.575 m)   Wt 80.7 kg   SpO2 97%   BMI 32.56 kg/m  Physical Exam Vitals and nursing note reviewed.  Constitutional:      General: She is not in acute distress.    Appearance: She is well-developed.  HENT:     Head: Normocephalic and atraumatic.     Mouth/Throat:     Pharynx: No oropharyngeal exudate.  Eyes:     Conjunctiva/sclera: Conjunctivae normal.     Pupils: Pupils are equal, round, and reactive to light.  Neck:     Comments: No meningismus. Cardiovascular:     Rate and Rhythm: Normal rate and regular rhythm.     Heart sounds: Normal heart sounds. No murmur heard. Pulmonary:     Effort: Pulmonary effort is normal. No respiratory distress.     Breath sounds: Normal breath sounds. No wheezing.  Abdominal:     Palpations: Abdomen is soft.     Tenderness: There is no abdominal tenderness. There is no guarding or rebound.  Musculoskeletal:        General: No tenderness. Normal range of motion.     Cervical back: Normal range of motion and neck supple.  Skin:    General: Skin is warm.  Neurological:     Mental Status: She is alert and oriented to person, place, and time.     Cranial Nerves: No cranial nerve deficit.     Motor: No abnormal muscle tone.     Coordination: Coordination normal.     Comments:  5/5 strength throughout. CN 2-12 intact.Equal grip strength.   Psychiatric:        Behavior: Behavior normal.     ED Results / Procedures / Treatments   Labs (all labs ordered are listed, but only abnormal results are displayed) Labs Reviewed  RESP PANEL BY RT-PCR (RSV, FLU A&B,  COVID)  RVPGX2 - Abnormal; Notable for the following components:      Result Value   Influenza A by PCR POSITIVE (*)    All other components within normal limits  COMPREHENSIVE METABOLIC PANEL - Abnormal; Notable for the following components:   Glucose, Bld 294 (*)    Calcium 8.6 (*)    Alkaline Phosphatase 129 (*)    All other components within normal limits  URINALYSIS, ROUTINE W REFLEX MICROSCOPIC - Abnormal; Notable for the  following components:   Specific Gravity, Urine 1.033 (*)    Glucose, UA >=500 (*)    Bacteria, UA RARE (*)    All other components within normal limits  CBG MONITORING, ED - Abnormal; Notable for the following components:   Glucose-Capillary 281 (*)    All other components within normal limits  CBC WITH DIFFERENTIAL/PLATELET  D-DIMER, QUANTITATIVE  I-STAT BETA HCG BLOOD, ED ([facility], [facility], [facility] ONLY)  TROPONIN I (HIGH SENSITIVITY)  TROPONIN I (HIGH SENSITIVITY)    EKG None  Radiology DG Chest 2 View  Result Date: 09/22/2022 CLINICAL DATA:  Cough and shortness of breath. EXAM: CHEST - 2 VIEW COMPARISON:  06/27/2022 FINDINGS: The lungs are clear without focal pneumonia, edema, pneumothorax or pleural effusion. The cardiopericardial silhouette is within normal limits for size. The visualized bony structures of the thorax are unremarkable. Tiny left upper lobe pulmonary nodule stable since a study from 11/25/2017 consistent with benign etiology such as calcified granuloma. IMPRESSION: No active cardiopulmonary disease. Electronically Signed   By: Misty Stanley M.D.   On: 09/22/2022 05:48    Procedures Procedures    Medications Ordered in ED Medications - No data to display  ED Course/ Medical Decision Making/ A&P                           Medical Decision Making Amount and/or Complexity of Data Reviewed Labs: ordered.  Risk OTC drugs.   8 days of influenza-like illness.  Sick contacts at home.  No hypoxia or increased work of breathing.  Some chest pain  with coughing.  EKG is sinus rhythm.  No acute ST changes.  Chest x-ray obtained in triage is negative for infiltrate.  Results reviewed interpreted by me.  Influenza swab is positive.  She is 8 days of symptoms and of the Tamiflu window.  Troponin negative, low suspicion for ACS.  D-dimer is negative.  Low suspicion for PE.  Patient not hypoxic without increased work of breathing and lungs are clear.  Follow-up with PCP.  Discussed supportive care at home for influenza with p.o. hydration, antipyretics.  No indication for Tamiflu at this time.  Return to the ED with exertional chest pain, pain associate with shortness of breath, nausea, vomiting or sweating or other concerns        Final Clinical Impression(s) / ED Diagnoses Final diagnoses:  None    Rx / DC Orders ED Discharge Orders     None         Ladan Vanderzanden, Annie Main, MD 09/22/22 1420

## 2022-09-22 NOTE — Discharge Instructions (Signed)
Keep yourself hydrated.  Use Tylenol or Motrin as needed for aches and for fever.  Follow-up with your primary doctor.  Return to the ED with exertional chest pain, pain associate with shortness of breath, nausea, vomiting, sweating or other concerns.

## 2022-09-22 NOTE — ED Provider Triage Note (Signed)
Emergency Medicine Provider Triage Evaluation Note  Shaquna Geigle , a 47 y.o. female  was evaluated in triage.  Pt complains of cough, chest pain, body aches, blood-tinged mucus.  No known ill contact..  Review of Systems  Positive: As above Negative: As above  Physical Exam  BP 129/80 (BP Location: Left Arm)   Pulse 90   Temp 98.6 F (37 C) (Oral)   Resp 15   Ht 5\' 2"  (1.575 m)   Wt 80.7 kg   SpO2 99%   BMI 32.56 kg/m  Gen:   Awake, no distress   Resp:  Normal effort  MSK:   Moves extremities without difficulty  Other:  RRR no setters is G.  Dry cough throughout.  Medical Decision Making  Medically screening exam initiated at 5:05 AM.  Appropriate orders placed.  Lexia Vandevender was informed that the remainder of the evaluation will be completed by another provider, this initial triage assessment does not replace that evaluation, and the importance of remaining in the ED until their evaluation is complete.  This chart was dictated using voice recognition software, Dragon. Despite the best efforts of this provider to proofread and correct errors, errors may still occur which can change documentation meaning.    Emeline Darling, PA-C 09/22/22 [phone]

## 2022-10-26 ENCOUNTER — Other Ambulatory Visit: Payer: Self-pay | Admitting: Pharmacist

## 2022-10-26 ENCOUNTER — Other Ambulatory Visit: Payer: Self-pay

## 2022-10-26 NOTE — Progress Notes (Signed)
Patient seen by Angus Seller, PharmD Candidate on 10/26/22 while they were picking up prescriptions at [place] [facility].   Blood pressure today was: 106/75, HR 73   Patient does not have an automated home blood pressure machine. Unsure what readings are at home.   Medication review was not able to be performed due to difficulties translating. Patient does not seem to have a diagnosis of HTN and does not take any BP lowering meds based on her chart.   The following barriers to adherence were noted:  - They do not have cost concerns.  - They do have questions or concerns about their medications. Does not seem to check her blood sugar consistently at home and seems to need blood glucose testing supplies.  - They do not have follow up scheduled with their primary care provider/cardiologist.   The following interventions were completed:  - Patient was educated on goal blood pressures and that her blood pressure was within goal.   The patient has follow up scheduled: Do not see follow up scheduled  PCP: Dr. Marcell Anger, PharmD Candidate  [place], [employer] of [employer]24   Catie Hedwig Morton, PharmD, [place], [place] [phone]

## 2022-11-04 ENCOUNTER — Other Ambulatory Visit: Payer: Self-pay

## 2022-11-07 ENCOUNTER — Other Ambulatory Visit: Payer: Self-pay

## 2022-11-09 ENCOUNTER — Ambulatory Visit: Payer: Self-pay

## 2022-11-09 NOTE — Telephone Encounter (Signed)
     Used Spanish interpreter # [id number]. Chief Complaint: Left middle finger is red and painful after cutting nail. Request appointment today or tomorrow. No availability. Symptoms: Above Frequency: Friday Pertinent Negatives: Patient denies fever Disposition: []$ ED /[]$ Urgent Care (no appt availability in office) / []$ Appointment(In office/virtual)/ []$  [place] [facility]/ []$ Home Care/ []$ Refused Recommended Disposition /[]$ [place] [facility]/ [x]$  Follow-up with PCP Additional Notes: Please advise pt.  Reason for Disposition  [1] After 3 days AND [2] pain not improved  Answer Assessment - Initial Assessment Questions 1. MECHANISM: "How did the injury happen?"      Cut fingernail 2. ONSET: "When did the injury happen?" (Minutes or hours ago)      Friday 3. LOCATION: "What part of the finger is injured?" "Is the nail damaged?"      Nail - left hand , middle 4. APPEARANCE of the INJURY: "What does the injury look like?"      Finger is red 5. SEVERITY: "Can you use the hand normally?"  "Can you bend your fingers into a ball and then fully open them?"     Numbness to hand 6. SIZE: For cuts, bruises, or swelling, ask: "How large is it?" (e.g., inches or centimeters;  entire finger)      Small 7. PAIN: "Is there pain?" If Yes, ask: "How bad is the pain?"    (e.g., Scale 1-10; or mild, moderate, severe)  - NONE (0): no pain.  - MILD (1-3): doesn't interfere with normal activities.   - MODERATE (4-7): interferes with normal activities or awakens from sleep.  - SEVERE (8-10): excruciating pain, unable to hold a glass of water or bend finger even a little.     Severe 8. TETANUS: For any breaks in the skin, ask: "When was the last tetanus booster?"     Unsure 9. OTHER SYMPTOMS: "Do you have any other symptoms?"     No 10. PREGNANCY: "Is there any chance you are pregnant?" "When was your last menstrual period?"       No  Protocols used: Finger Injury-A-AH

## 2022-11-11 NOTE — Telephone Encounter (Signed)
Noted  

## 2022-12-07 ENCOUNTER — Telehealth: Payer: Self-pay | Admitting: Family Medicine

## 2022-12-07 ENCOUNTER — Encounter: Payer: Self-pay | Admitting: Family Medicine

## 2022-12-07 ENCOUNTER — Other Ambulatory Visit: Payer: Self-pay

## 2022-12-07 ENCOUNTER — Ambulatory Visit: Payer: Self-pay | Attending: Family Medicine | Admitting: Family Medicine

## 2022-12-07 VITALS — BP 104/69 | HR 85 | Temp 97.9°F | Ht 62.0 in | Wt 175.4 lb

## 2022-12-07 DIAGNOSIS — Z634 Disappearance and death of family member: Secondary | ICD-10-CM

## 2022-12-07 DIAGNOSIS — Z91199 Patient's noncompliance with other medical treatment and regimen due to unspecified reason: Secondary | ICD-10-CM

## 2022-12-07 DIAGNOSIS — E118 Type 2 diabetes mellitus with unspecified complications: Secondary | ICD-10-CM

## 2022-12-07 DIAGNOSIS — J302 Other seasonal allergic rhinitis: Secondary | ICD-10-CM

## 2022-12-07 DIAGNOSIS — F4321 Adjustment disorder with depressed mood: Secondary | ICD-10-CM

## 2022-12-07 DIAGNOSIS — F411 Generalized anxiety disorder: Secondary | ICD-10-CM

## 2022-12-07 DIAGNOSIS — E1142 Type 2 diabetes mellitus with diabetic polyneuropathy: Secondary | ICD-10-CM

## 2022-12-07 DIAGNOSIS — E7800 Pure hypercholesterolemia, unspecified: Secondary | ICD-10-CM

## 2022-12-07 DIAGNOSIS — Z1211 Encounter for screening for malignant neoplasm of colon: Secondary | ICD-10-CM

## 2022-12-07 LAB — POCT GLYCOSYLATED HEMOGLOBIN (HGB A1C): HbA1c, POC (controlled diabetic range): 11.4 % — AB (ref 0.0–7.0)

## 2022-12-07 MED ORDER — GABAPENTIN 300 MG PO CAPS
300.0000 mg | ORAL_CAPSULE | Freq: Every day | ORAL | 1 refills | Status: DC
  Filled 2022-12-07: qty 30, 30d supply, fill #0

## 2022-12-07 MED ORDER — FLUCONAZOLE 150 MG PO TABS
150.0000 mg | ORAL_TABLET | Freq: Every day | ORAL | 0 refills | Status: DC
Start: 2022-12-07 — End: 2023-04-06
  Filled 2022-12-07: qty 1, 1d supply, fill #0

## 2022-12-07 MED ORDER — ATORVASTATIN CALCIUM 80 MG PO TABS
80.0000 mg | ORAL_TABLET | Freq: Every day | ORAL | 1 refills | Status: AC
  Filled 2022-12-07: qty 30, 30d supply, fill #0
  Filled 2023-05-03: qty 30, 30d supply, fill #1

## 2022-12-07 MED ORDER — ALBUTEROL SULFATE HFA 108 (90 BASE) MCG/ACT IN AERS
2.0000 | INHALATION_SPRAY | Freq: Four times a day (QID) | RESPIRATORY_TRACT | 2 refills | Status: AC | PRN
  Filled 2022-12-07: qty 6.7, 25d supply, fill #0
  Filled 2023-05-03: qty 6.7, 25d supply, fill #1

## 2022-12-07 MED ORDER — FLUOXETINE HCL 20 MG PO TABS
20.0000 mg | ORAL_TABLET | Freq: Every day | ORAL | 1 refills | Status: DC
  Filled 2022-12-07: qty 90, 90d supply, fill #0

## 2022-12-07 MED ORDER — GLIPIZIDE 10 MG PO TABS
10.0000 mg | ORAL_TABLET | Freq: Two times a day (BID) | ORAL | 1 refills | Status: AC
  Filled 2022-12-07: qty 60, 30d supply, fill #0
  Filled 2023-05-03: qty 60, 30d supply, fill #1

## 2022-12-07 MED ORDER — METFORMIN HCL 500 MG PO TABS
1000.0000 mg | ORAL_TABLET | Freq: Two times a day (BID) | ORAL | 1 refills | Status: AC
  Filled 2022-12-07: qty 120, 30d supply, fill #0
  Filled 2023-05-03: qty 120, 30d supply, fill #1

## 2022-12-07 MED ORDER — CETIRIZINE HCL 10 MG PO TABS
10.0000 mg | ORAL_TABLET | Freq: Every day | ORAL | 11 refills | Status: AC
  Filled 2022-12-07: qty 30, 30d supply, fill #0
  Filled 2023-05-03: qty 30, 30d supply, fill #1

## 2022-12-07 MED ORDER — LANTUS SOLOSTAR 100 UNIT/ML ~~LOC~~ SOPN
15.0000 [IU] | PEN_INJECTOR | Freq: Every day | SUBCUTANEOUS | 3 refills | Status: AC
  Filled 2022-12-07: qty 6, 40d supply, fill #0
  Filled 2023-05-03: qty 6, 40d supply, fill #1

## 2022-12-07 NOTE — Progress Notes (Signed)
Subjective:  Patient ID: Alexandra Aguirre, female    DOB: 1975-10-04  Age: 47 y.o. MRN: [id number]  CC: Diabetes   HPI Alexandra Aguirre is a 47 y.o. year old female with a history of type 2 diabetes mellitus (A1c 9.1), hyperlipidemia, GERD, gastritis, asthma, bereaved of her son in the spring of 2021 seen for follow-up visit today.   Interval History:  Complains of since the onset of the pollen she has been congested and does not have any medications. She has been coughing and feels like she is choking.  A1c is 11.4 up from 9.1 and she has only been taking Metformin and Glipizide but not administering Lantus because 'she is scared to use Lantus'.  Endorses presence of neuropathy in her hands.  Gabapentin appears on her med list but she has not been taking this.  She has no visual concerns or hypoglycemia.  Adherence with her statin cannot be ascertained.  She is not adherent with a diabetic diet or exercise either. She has noticed some vaginal itching but no discharge. She Complains of being under a lot of stress as her Mom passed away and she is dealing with the loss of her son and her Dad is sick.  She breaks down crying. Past Medical History:  Diagnosis Date   Abnormal Pap smear of cervix 09/24/2013   AGUS   Acute non-recurrent maxillary sinusitis 11/01/2018   Acute tonsillitis 11/01/2018   Asthma    Diabetes mellitus without complication (HCC)    Environmental allergies    GERD (gastroesophageal reflux disease)    Non-recurrent acute suppurative otitis media of left ear without spontaneous rupture of tympanic membrane 11/01/2018    Past Surgical History:  Procedure Laterality Date   CERVICAL BIOPSY  W/ LOOP ELECTRODE EXCISION     removal of cyst Right 2011    Family History  Problem Relation Age of Onset   Cancer Mother        cervical/ovarian   Diabetes Mother    Diabetes Maternal Uncle    Diabetes Brother    Diabetes Maternal Uncle    Diabetes Maternal Uncle      Social History   Socioeconomic History   Marital status: Married    Spouse name: Not on file   Number of children: Not on file   Years of education: Not on file   Highest education level: 3rd grade  Occupational History   Not on file  Tobacco Use   Smoking status: Never   Smokeless tobacco: Never  Vaping Use   Vaping Use: Never used  Substance and Sexual Activity   Alcohol use: No   Drug use: No   Sexual activity: Yes    Birth control/protection: None  Other Topics Concern   Not on file  Social History Narrative   Not on file   Social Determinants of Health   Financial Resource Strain: Not on file  Food Insecurity: Food Insecurity Present (11/03/2020)   Hunger Vital Sign    Worried About Running Out of Food in the Last Year: Sometimes true    Ran Out of Food in the Last Year: Sometimes true  Transportation Needs: No Transportation Needs (06/15/2020)   PRAPARE - Hydrologist (Medical): No    Lack of Transportation (Non-Medical): No  Physical Activity: Not on file  Stress: Not on file  Social Connections: Not on file    Allergies  Allergen Reactions   Fruit & Vegetable Daily [Nutritional Supplements]  Other (See Comments)    "mouth rash" from Avocado, apple, peaches, plums, kiwi    Outpatient Medications Prior to Visit  Medication Sig Dispense Refill   glucose blood (TRUE METRIX BLOOD GLUCOSE TEST) test strip Use as directed to check blood sugar twice daily. 100 each 12   ibuprofen (ADVIL) 600 MG tablet Take 1 tablet (600 mg total) by mouth every 6 (six) hours as needed. To reduce pain. 30 tablet 0   Insulin Pen Needle (PEN NEEDLES 31GX5/16") 31G X 8 MM MISC 1 Device by Does not apply route 5 (five) times daily. 100 each 11   naproxen (NAPROSYN) 500 MG tablet Take 1 tablet (500 mg total) by mouth 2 (two) times daily. 30 tablet 0   pantoprazole (PROTONIX) 40 MG tablet TAKE 1 TABLET (40 MG TOTAL) BY MOUTH DAILY. (Patient taking  differently: Take 40 mg by mouth daily.) 30 tablet 2   TRUEplus Lancets 28G MISC Use as directed to check blood sugar twice daily. 100 each 11   atorvastatin (LIPITOR) 80 MG tablet Take 1 tablet (80 mg total) by mouth daily. 30 tablet 6   cetirizine (ZYRTEC) 10 MG tablet Take 1 tablet (10 mg total) by mouth daily. 30 tablet 11   gabapentin (NEURONTIN) 300 MG capsule Take 1 capsule (300 mg total) by mouth at bedtime. 30 capsule 3   insulin glargine (LANTUS SOLOSTAR) 100 UNIT/ML Solostar Pen Inject 15 Units into the skin daily. 15 mL PRN   metFORMIN (GLUCOPHAGE) 500 MG tablet Take 3 tablets (1,500 mg total) by mouth in the morning for 30 days, THEN 2 tablets (1,000 mg total) every evening. 150 tablet 1   sitaGLIPtin (JANUVIA) 100 MG tablet TAKE 1 TABLET (100 MG TOTAL) BY MOUTH DAILY. 30 tablet 2   benzonatate (TESSALON) 100 MG capsule Take 1 capsule (100 mg total) by mouth every 8 (eight) hours. (Patient not taking: Reported on 12/07/2022) 21 capsule 0   fluconazole (DIFLUCAN) 150 MG tablet Take 1 tablet (150 mg total) by mouth daily. (Patient not taking: Reported on 12/07/2022) 1 tablet 0   glipiZIDE (GLUCOTROL) 10 MG tablet TAKE 1 TABLET (10 MG TOTAL) BY MOUTH 2 (TWO) TIMES DAILY BEFORE A MEAL. (Patient taking differently: Take 10 mg by mouth 2 (two) times daily before a meal.) 60 tablet 6   No facility-administered medications prior to visit.     ROS Review of Systems  Constitutional:  Negative for activity change and appetite change.  HENT:  Negative for sinus pressure and sore throat.   Respiratory:  Negative for chest tightness, shortness of breath and wheezing.   Cardiovascular:  Negative for chest pain and palpitations.  Gastrointestinal:  Negative for abdominal distention, abdominal pain and constipation.  Genitourinary: Negative.   Musculoskeletal: Negative.   Neurological:  Positive for numbness.  Psychiatric/Behavioral:  Positive for dysphoric mood. Negative for behavioral problems.      Objective:  BP 104/69   Pulse 85   Temp 97.9 F (36.6 C) (Oral)   Ht 5\' 2"  (1.575 m)   Wt 175 lb 6.4 oz (79.6 kg)   SpO2 96%   BMI 32.08 kg/m      12/07/2022   10:53 AM 09/22/2022   12:57 PM 09/22/2022   12:55 PM  BP/Weight  Systolic BP 123456 [id number]  Diastolic BP 69 69 72  Wt. (Lbs) 175.4    BMI 32.08 kg/m2        Physical Exam Constitutional:      Appearance: She is well-developed.  Cardiovascular:     Rate and Rhythm: Normal rate.     Heart sounds: Normal heart sounds. No murmur heard. Pulmonary:     Effort: Pulmonary effort is normal.     Breath sounds: Normal breath sounds. No wheezing or rales.  Chest:     Chest wall: No tenderness.  Abdominal:     General: Bowel sounds are normal. There is no distension.     Palpations: Abdomen is soft. There is no mass.     Tenderness: There is no abdominal tenderness.  Musculoskeletal:        General: Normal range of motion.     Right lower leg: No edema.     Left lower leg: No edema.  Neurological:     Mental Status: She is alert and oriented to person, place, and time.  Psychiatric:     Comments: Dysphoric mood        Latest Ref Rng & Units 09/22/2022    5:24 AM 02/17/2022    3:20 PM 10/11/2021   11:07 AM  CMP  Glucose 70 - 99 mg/dL 294  313  266   BUN 6 - 20 mg/dL 7  10  12    Creatinine 0.44 - 1.00 mg/dL 0.46  0.44  0.48   Sodium 135 - 145 mmol/L 135  134  137   Potassium 3.5 - 5.1 mmol/L 3.9  4.0  4.6   Chloride 98 - 111 mmol/L 101  99  102   CO2 22 - 32 mmol/L 26   21   Calcium 8.9 - 10.3 mg/dL 8.6  9.3  9.4   Total Protein 6.5 - 8.1 g/dL 7.4  7.1  7.6   Total Bilirubin 0.3 - 1.2 mg/dL 0.4  0.3  0.5   Alkaline Phos 38 - 126 U/L 129  150  140   AST 15 - 41 U/L 20  15  15    ALT 0 - 44 U/L 24   21     Lipid Panel     Component Value Date/Time   CHOL 182 10/11/2021 1107   TRIG 136 10/11/2021 1107   HDL 36 (L) 10/11/2021 1107   CHOLHDL 5.3 (H) 10/13/2020 0930   CHOLHDL 6.2 03/09/2018 1147   VLDL 38  03/09/2018 1147   LDLCALC 121 (H) 10/11/2021 1107    CBC    Component Value Date/Time   WBC 4.3 09/22/2022 0524   RBC 4.85 09/22/2022 0524   HGB 14.6 09/22/2022 0524   HGB 13.3 02/17/2022 1520   HCT 42.8 09/22/2022 0524   HCT 39.2 02/17/2022 1520   PLT 284 09/22/2022 0524   PLT 336 02/17/2022 1520   MCV 88.2 09/22/2022 0524   MCV 89 02/17/2022 1520   MCH 30.1 09/22/2022 0524   MCHC 34.1 09/22/2022 0524   RDW 12.1 09/22/2022 0524   RDW 12.5 02/17/2022 1520   LYMPHSABS 1.9 09/22/2022 0524   LYMPHSABS 2.1 02/17/2022 1520   MONOABS 0.5 09/22/2022 0524   EOSABS 0.3 09/22/2022 0524   EOSABS 0.3 02/17/2022 1520   BASOSABS 0.0 09/22/2022 0524   BASOSABS 0.1 02/17/2022 1520    Lab Results  Component Value Date   HGBA1C 11.4 (A) 12/07/2022    Assessment & Plan:  1. Type 2 diabetes mellitus with complication, without long-term current use of insulin (HCC) Uncontrolled with A1c of 11.4, goal is less than 7.0 Medication nonadherence contributing We have discussed the consequences of her nonadherence and she is motivated to restart her insulin Counseled on  Diabetic diet, my plate method, [id number] minutes of moderate intensity exercise/week Blood sugar logs with fasting goals of 80-120 mg/dl, random of less than 180 and in the event of sugars less than 60 mg/dl or greater than 400 mg/dl encouraged to notify the clinic. Advised on the need for annual eye exams, annual foot exams, Pneumonia vaccine. - POCT glycosylated hemoglobin (Hb A1C) - glipiZIDE (GLUCOTROL) 10 MG tablet; Take 1 tablet (10 mg total) by mouth 2 (two) times daily before a meal.  Dispense: 180 tablet; Refill: 1 - insulin glargine (LANTUS SOLOSTAR) 100 UNIT/ML Solostar Pen; Inject 15 Units into the skin daily.  Dispense: 15 mL; Refill: 3 - metFORMIN (GLUCOPHAGE) 500 MG tablet; Take 2 tablets (1,000 mg total) by mouth 2 (two) times daily with a meal.  Dispense: 360 tablet; Refill: 1  2. Seasonal  allergies Uncontrolled Restart antihistamine, nasal steroid spray and inhaler - albuterol (VENTOLIN HFA) 108 (90 Base) MCG/ACT inhaler; Inhale 2 puffs into the lungs every 6 (six) hours as needed for wheezing or shortness of breath.  Dispense: 8 g; Refill: 2 - cetirizine (ZYRTEC) 10 MG tablet; Take 1 tablet (10 mg total) by mouth daily.  Dispense: 30 tablet; Refill: 11  3. Non-adherence to medical treatment Discussed complications of untreated diabetes mellitus She is motivated to be more adherent  4. Pure hypercholesterolemia Uncontrolled due to medication nonadherence Restart atorvastatin Low-cholesterol diet - atorvastatin (LIPITOR) 80 MG tablet; Take 1 tablet (80 mg total) by mouth daily.  Dispense: 90 tablet; Refill: 1  5. Diabetic polyneuropathy associated with type 2 diabetes mellitus ([place]) Uncontrolled due to medication nonadherence Restart gabapentin - gabapentin (NEURONTIN) 300 MG capsule; Take 1 capsule (300 mg total) by mouth at bedtime.  Dispense: 90 capsule; Refill: 1  6. Screening for colon cancer - Fecal occult blood, imunochemical([facility])  7. Generalized anxiety disorder Placed on Prozac Will place on LCSW schedule for therapy Anxiety and depression complicated by loss of child by suicide in 2021 coupled with loss of mom and psychosocial stressors  8. Grief at loss of child Son committed suicide 3 years ago She will benefit from psychotherapy See #7 above    Meds ordered this encounter  Medications   albuterol (VENTOLIN HFA) 108 (90 Base) MCG/ACT inhaler    Sig: Inhale 2 puffs into the lungs every 6 (six) hours as needed for wheezing or shortness of breath.    Dispense:  8 g    Refill:  2   cetirizine (ZYRTEC) 10 MG tablet    Sig: Take 1 tablet (10 mg total) by mouth daily.    Dispense:  30 tablet    Refill:  11   fluconazole (DIFLUCAN) 150 MG tablet    Sig: Take 1 tablet (150 mg total) by mouth daily.    Dispense:  1 tablet    Refill:  0    atorvastatin (LIPITOR) 80 MG tablet    Sig: Take 1 tablet (80 mg total) by mouth daily.    Dispense:  90 tablet    Refill:  1   gabapentin (NEURONTIN) 300 MG capsule    Sig: Take 1 capsule (300 mg total) by mouth at bedtime.    Dispense:  90 capsule    Refill:  1   glipiZIDE (GLUCOTROL) 10 MG tablet    Sig: Take 1 tablet (10 mg total) by mouth 2 (two) times daily before a meal.    Dispense:  180 tablet    Refill:  1   insulin glargine (LANTUS SOLOSTAR)  100 UNIT/ML Solostar Pen    Sig: Inject 15 Units into the skin daily.    Dispense:  15 mL    Refill:  3   metFORMIN (GLUCOPHAGE) 500 MG tablet    Sig: Take 2 tablets (1,000 mg total) by mouth 2 (two) times daily with a meal.    Dispense:  360 tablet    Refill:  1    Change in script   FLUoxetine (PROZAC) 20 MG tablet    Sig: Take 1 tablet (20 mg total) by mouth daily.    Dispense:  90 tablet    Refill:  1    Follow-up: Return in about 3 months (around 03/09/2023) for Chronic medical conditions.       Charlott Rakes, MD, FAAFP. [facility] and [place], [place]   12/07/2022, 12:32 PM

## 2022-12-07 NOTE — Patient Instructions (Signed)
Plan de acci?n para la diabetes mellitus ?Diabetes Mellitus Action Plan ?Seguir un plan de acci?n para la diabetes es una forma de controlar sus s?ntomas de diabetes (diabetes mellitus). El plan se codifica con colores para ayudarlo a comprender qu? acciones necesita tomar en funci?n de los s?ntomas que est? teniendo. ?Si tiene s?ntomas pertenecientes a la zona roja, necesita buscar atenci?n m?dica inmediatamente. ?Si tiene s?ntomas pertenecientes a la zona amarilla, est? teniendo problemas. ?Si tiene s?ntomas pertenecientes a la zona verde, significa que se encuentra bien. ?Aprender y comprender la diabetes puede llevar tiempo. Siga el plan que elabor? con el m?dico. Conozca el rango deseado para su nivel de az?car en la sangre (glucosa), y revise su plan de tratamiento con su m?dico en cada visita. ?El rango deseado para mi nivel de az?car en la sangre es __________________________ mg/dl. ?Zona roja ?Obtenga ayuda de inmediato si observa cualquiera de estos s?ntomas: ?Un resultado de az?car en la sangre que est? por debajo de 54 mg/dl (3 mmol/l). ?Un nivel de az?car en la sangre mayor o igual que 240 mg/dl (13,3 mmol/l) durante 2 d?as seguidos. ?Confusi?n o dificultad para pensar con claridad. ?Dificultad para respirar. ?Malestar o fiebre durante 2 o m?s d?as que no mejora. ?Niveles moderados o altos de cetonas en la orina. ?Sentirse cansado o sin energ?a. ?Si tiene cualquiera de los s?ntomas pertenecientes a la zona roja, no espere para ver si desaparecen. Solicite atenci?n m?dica de inmediato. Comun?quese con el servicio de emergencias de su localidad (911 en los [place]). No conduzca por sus propios medios hasta el hospital. ?Si tiene un nivel de az?car en la sangre muy bajo (hipoglucemia grave) y no puede ingerir ning?n alimento ni bebida, tal vez necesite glucag?n. Aseg?rese de que un familiar o amigo sepa controlarle el nivel de az?car en la sangre y aplicarle glucag?n. Puede necesitar tratamiento en  un hospital para esta afecci?n. ?Zona amarilla ?Si tiene alguno de los siguientes s?ntomas, su diabetes no est? controlada y usted necesitar? hacer algunos cambios: ?Un nivel de az?car en la sangre mayor o igual que 240 mg/dl (13,3 mmol/l) durante 2 d?as seguidos. ?Un resultado de az?car en la sangre que est? por debajo de 70 mg/dl (3,9 mmol/l). ?Otros s?ntomas de hipoglucemia, como: ?Temblores o sensaci?n de desvanecimiento. ?Confusi?n o irritabilidad. ?Sensaci?n de hambre. ?Latidos card?acos acelerados. ?Si tiene alg?n s?ntoma de la zona amarilla: ?Trate la hipoglucemia comiendo o bebiendo 15 gramos de hidratos de carbono de acci?n r?pida. Siga las regla 15:15: ?Consuma 15 gramos de hidratos de carbono de acci?n r?pida, como: ?1 pomo de glucosa en gel. ?4 comprimidos de glucosa. ?4 onzas (120 ml) de jugo de frutas. ?4 onzas (120 ml) de refresco com?n (no diet?tico). ?Controle su nivel de az?car en la sangre 15 minutos despu?s de ingerir el hidrato de carbono. ?Si este nuevo nivel de az?car en la sangre todav?a es igual o menor que 70 mg/dl (3,9 mmol/l), ingiera nuevamente 15 gramos de un hidrato de carbono. ?Si el nivel de az?car en la sangre no supera los 70 mg/dl (3,9 mmol/l) despu?s de 3 intentos, solicite ayuda m?dica de inmediato. ?Ingiera una comida o un refrigerio en el transcurso de 1 hora despu?s de que el nivel de az?car en la sangre se haya normalizado. ?Siga tomando los medicamentos diarios como se lo haya indicado el m?dico. ?Controle su nivel de az?car en la sangre con m?s frecuencia que lo har?a normalmente. ?Anote los resultados. ?Llame al m?dico si tiene problemas para mantener el nivel de az?car   en la sangre dentro del rango deseado. ? ?Zona verde ?Estos signos significan que se encuentra bien y puede continuar haciendo lo que est? haciendo para controlar la diabetes: ?Su nivel de az?car en la sangre est? en el rango deseado. En la mayor?a de las personas, el nivel de az?car en la sangre antes de  una comida (preprandial) deber?a ser de 80 a 130 mg/dl (de 4.4 a 7.2 mmol/l). ?Se siente bien, y pueda volver a realizar las actividades diarias. ?Si se encuentra en la zona verde, contin?e controlando su diabetes como se lo haya indicado el m?dico. Para hacer esto: ?Siga una dieta saludable. ?Haga ejercicio regularmente. ?Controle su nivel de az?car en la sangre como se lo haya indicado el m?dico. ?Tome los medicamentos como se lo haya indicado el m?dico. ? ?D?nde buscar m?s informaci?n ?American Diabetes Association (ADA) (Asociaci?n [place] de la Diabetes): diabetes.org ?Association of Diabetes Care & Education Specialists (ADCES) (Asociaci?n de Especialistas en Atenci?n y Educaci?n sobre la Diabetes): diabeteseducator.org ?Resumen ?Seguir un plan de acci?n para la diabetes, es una forma de controlar sus s?ntomas de diabetes. El plan se codifica con colores para ayudarlo a comprender qu? acciones necesita tomar en funci?n de los s?ntomas que est? teniendo. ?Siga el plan que elabor? con el m?dico. Aseg?rese de conocer su nivel deseado de az?car en la sangre. ?Revise el plan de tratamiento con el m?dico en todas las consultas. ?Esta informaci?n no tiene como fin reemplazar el consejo del m?dico. Aseg?rese de hacerle al m?dico cualquier pregunta que tenga. ?Document Revised: 04/16/2020 Document Reviewed: 04/16/2020 ?Elsevier Patient Education ? 2023 Elsevier Inc. ? ?

## 2022-12-07 NOTE — Progress Notes (Signed)
Vaginal itching Allergies

## 2022-12-07 NOTE — Telephone Encounter (Signed)
Please evaluate for psychotherapy.  Son committed suicide in 2021 and she recently suffered the loss of her mom.  I started her on Prozac at her last visit.  Thank you.

## 2022-12-15 NOTE — Telephone Encounter (Signed)
LCSWA called patient today using interpreter [phone] to introduce herself and to assess patients' mental health needs. Patient denies being a pt of [place]. Patient was referred by PCP for  evaluate for psychotherapy.  Son committed suicide in 2021 and she recently suffered the loss of her mom.  I started her on Prozac at her last visit.

## 2023-03-08 ENCOUNTER — Other Ambulatory Visit: Payer: Self-pay

## 2023-03-08 ENCOUNTER — Telehealth: Payer: Self-pay | Admitting: Family Medicine

## 2023-03-08 ENCOUNTER — Ambulatory Visit: Payer: Self-pay | Attending: Family Medicine | Admitting: Family Medicine

## 2023-03-08 VITALS — BP 115/74 | HR 82 | Temp 98.0°F | Ht 62.0 in | Wt 170.8 lb

## 2023-03-08 DIAGNOSIS — Z1211 Encounter for screening for malignant neoplasm of colon: Secondary | ICD-10-CM

## 2023-03-08 DIAGNOSIS — Z91199 Patient's noncompliance with other medical treatment and regimen due to unspecified reason: Secondary | ICD-10-CM

## 2023-03-08 DIAGNOSIS — E118 Type 2 diabetes mellitus with unspecified complications: Secondary | ICD-10-CM

## 2023-03-08 DIAGNOSIS — G8929 Other chronic pain: Secondary | ICD-10-CM

## 2023-03-08 DIAGNOSIS — K089 Disorder of teeth and supporting structures, unspecified: Secondary | ICD-10-CM

## 2023-03-08 DIAGNOSIS — Z1159 Encounter for screening for other viral diseases: Secondary | ICD-10-CM

## 2023-03-08 DIAGNOSIS — R1032 Left lower quadrant pain: Secondary | ICD-10-CM

## 2023-03-08 DIAGNOSIS — L659 Nonscarring hair loss, unspecified: Secondary | ICD-10-CM

## 2023-03-08 DIAGNOSIS — L0292 Furuncle, unspecified: Secondary | ICD-10-CM

## 2023-03-08 DIAGNOSIS — Z7984 Long term (current) use of oral hypoglycemic drugs: Secondary | ICD-10-CM

## 2023-03-08 DIAGNOSIS — Z794 Long term (current) use of insulin: Secondary | ICD-10-CM

## 2023-03-08 LAB — POCT GLYCOSYLATED HEMOGLOBIN (HGB A1C): HbA1c, POC (controlled diabetic range): 10.8 % — AB (ref 0.0–7.0)

## 2023-03-08 MED ORDER — CEPHALEXIN 500 MG PO CAPS
500.0000 mg | ORAL_CAPSULE | Freq: Two times a day (BID) | ORAL | 0 refills | Status: DC
Start: 2023-03-08 — End: 2023-04-06
  Filled 2023-03-08: qty 14, 7d supply, fill #0

## 2023-03-08 NOTE — Progress Notes (Signed)
Subjective:  Patient ID: Alexandra Aguirre, female    DOB: Sep 16, 1976  Age: 47 y.o. MRN: [id number]  CC: Diabetes   HPI Alexandra Aguirre is a 47 y.o. year old female with a history of type 2 diabetes mellitus (A1c 10.8), hyperlipidemia, GERD, gastritis, asthma, bereaved of a son in the spring of 2021 seen for follow-up visit today.   Interval History:  Today she Complains of feeling sad due to passing of her Dad 2 months ago in addition to her Mom 8 months ago and she still struggles with her son's suicide. I had referred her to the LCSW and notes indicate she had denied being a patient of [facility]. I had initiated Prozac but she has not been taking it. She states her son was on antidepressants which made his symptoms worse resulting in suicide so she would rather not take anything.  She has not been adherent with her insulin states she feels good.  Today she complains of hair loss which has been present over the last few months. She has also had intermittent LLQ pain with no associated nausea, vomiting, diarrhea ,constipation.  She also noticed a painful bump in her perineum. Past Medical History:  Diagnosis Date   Abnormal Pap smear of cervix 09/24/2013   AGUS   Acute non-recurrent maxillary sinusitis 11/01/2018   Acute tonsillitis 11/01/2018   Asthma    Diabetes mellitus without complication (HCC)    Environmental allergies    GERD (gastroesophageal reflux disease)    Non-recurrent acute suppurative otitis media of left ear without spontaneous rupture of tympanic membrane 11/01/2018    Past Surgical History:  Procedure Laterality Date   CERVICAL BIOPSY  W/ LOOP ELECTRODE EXCISION     removal of cyst Right 2011    Family History  Problem Relation Age of Onset   Cancer Mother        cervical/ovarian   Diabetes Mother    Diabetes Maternal Uncle    Diabetes Brother    Diabetes Maternal Uncle    Diabetes Maternal Uncle     Social History   Socioeconomic History   Marital  status: Married    Spouse name: Not on file   Number of children: Not on file   Years of education: Not on file   Highest education level: 3rd grade  Occupational History   Not on file  Tobacco Use   Smoking status: Never   Smokeless tobacco: Never  Vaping Use   Vaping Use: Never used  Substance and Sexual Activity   Alcohol use: No   Drug use: No   Sexual activity: Yes    Birth control/protection: None  Other Topics Concern   Not on file  Social History Narrative   Not on file   Social Determinants of Health   Financial Resource Strain: Not on file  Food Insecurity: Food Insecurity Present (11/03/2020)   Hunger Vital Sign    Worried About Running Out of Food in the Last Year: Sometimes true    Ran Out of Food in the Last Year: Sometimes true  Transportation Needs: No Transportation Needs (06/15/2020)   PRAPARE - Administrator, Civil Service (Medical): No    Lack of Transportation (Non-Medical): No  Physical Activity: Not on file  Stress: Not on file  Social Connections: Not on file    Allergies  Allergen Reactions   Fruit & Vegetable Daily [Nutritional Supplements] Other (See Comments)    "mouth rash" from Avocado, apple, peaches, plums,  kiwi    Outpatient Medications Prior to Visit  Medication Sig Dispense Refill   albuterol (VENTOLIN HFA) 108 (90 Base) MCG/ACT inhaler Inhale 2 puffs into the lungs every 6 (six) hours as needed for wheezing or shortness of breath. 8 g 2   atorvastatin (LIPITOR) 80 MG tablet Take 1 tablet (80 mg total) by mouth daily. 90 tablet 1   cetirizine (ZYRTEC) 10 MG tablet Take 1 tablet (10 mg total) by mouth daily. 30 tablet 11   FLUoxetine (PROZAC) 20 MG tablet Take 1 tablet (20 mg total) by mouth daily. 90 tablet 1   gabapentin (NEURONTIN) 300 MG capsule Take 1 capsule (300 mg total) by mouth at bedtime. 90 capsule 1   glipiZIDE (GLUCOTROL) 10 MG tablet Take 1 tablet (10 mg total) by mouth 2 (two) times daily before a meal. 180  tablet 1   glucose blood (TRUE METRIX BLOOD GLUCOSE TEST) test strip Use as directed to check blood sugar twice daily. 100 each 12   ibuprofen (ADVIL) 600 MG tablet Take 1 tablet (600 mg total) by mouth every 6 (six) hours as needed. To reduce pain. 30 tablet 0   insulin glargine (LANTUS SOLOSTAR) 100 UNIT/ML Solostar Pen Inject 15 Units into the skin daily. 15 mL 3   Insulin Pen Needle (PEN NEEDLES 31GX5/16") 31G X 8 MM MISC 1 Device by Does not apply route 5 (five) times daily. 100 each 11   metFORMIN (GLUCOPHAGE) 500 MG tablet Take 2 tablets (1,000 mg total) by mouth 2 (two) times daily with a meal. 360 tablet 1   naproxen (NAPROSYN) 500 MG tablet Take 1 tablet (500 mg total) by mouth 2 (two) times daily. 30 tablet 0   pantoprazole (PROTONIX) 40 MG tablet TAKE 1 TABLET (40 MG TOTAL) BY MOUTH DAILY. (Patient taking differently: Take 40 mg by mouth daily.) 30 tablet 2   TRUEplus Lancets 28G MISC Use as directed to check blood sugar twice daily. 100 each 11   benzonatate (TESSALON) 100 MG capsule Take 1 capsule (100 mg total) by mouth every 8 (eight) hours. (Patient not taking: Reported on 12/07/2022) 21 capsule 0   fluconazole (DIFLUCAN) 150 MG tablet Take 1 tablet (150 mg total) by mouth daily. (Patient not taking: Reported on 03/08/2023) 1 tablet 0   No facility-administered medications prior to visit.     ROS Review of Systems  Constitutional:  Negative for activity change and appetite change.  HENT:  Negative for sinus pressure and sore throat.   Respiratory:  Negative for chest tightness, shortness of breath and wheezing.   Cardiovascular:  Negative for chest pain and palpitations.  Gastrointestinal:  Positive for abdominal pain. Negative for abdominal distention and constipation.  Genitourinary: Negative.   Musculoskeletal: Negative.   Psychiatric/Behavioral:  Negative for behavioral problems and dysphoric mood.    Objective:  BP 115/74   Pulse 82   Temp 98 F (36.7 C) (Oral)    Ht 5\' 2"  (1.575 m)   Wt 170 lb 12.8 oz (77.5 kg)   SpO2 98%   BMI 31.24 kg/m      03/08/2023   11:14 AM 12/07/2022   10:53 AM 09/22/2022   12:57 PM  BP/Weight  Systolic BP 115 104 126  Diastolic BP 74 69 69  Wt. (Lbs) 170.8 175.4   BMI 31.24 kg/m2 32.08 kg/m2       Physical Exam Constitutional:      Appearance: She is well-developed.  Cardiovascular:     Rate and Rhythm: Normal  rate.     Heart sounds: Normal heart sounds. No murmur heard. Pulmonary:     Effort: Pulmonary effort is normal.     Breath sounds: Normal breath sounds. No wheezing or rales.  Chest:     Chest wall: No tenderness.  Abdominal:     General: Bowel sounds are normal. There is no distension.     Palpations: Abdomen is soft. There is no mass.     Tenderness: There is abdominal tenderness (slight LLQ TTP).  Genitourinary:    Comments: Furuncle on lower half inferior to left labia majora. No discharge Musculoskeletal:        General: Normal range of motion.     Right lower leg: No edema.     Left lower leg: No edema.  Neurological:     Mental Status: She is alert and oriented to person, place, and time.  Psychiatric:        Mood and Affect: Mood normal.        Latest Ref Rng & Units 09/22/2022    5:24 AM 02/17/2022    3:20 PM 10/11/2021   11:07 AM  CMP  Glucose 70 - 99 mg/dL 409  811  914   BUN 6 - 20 mg/dL 7  10  12    Creatinine 0.44 - 1.00 mg/dL 7.82  9.56  2.13   Sodium 135 - 145 mmol/L 135  134  137   Potassium 3.5 - 5.1 mmol/L 3.9  4.0  4.6   Chloride 98 - 111 mmol/L 101  99  102   CO2 22 - 32 mmol/L 26   21   Calcium 8.9 - 10.3 mg/dL 8.6  9.3  9.4   Total Protein 6.5 - 8.1 g/dL 7.4  7.1  7.6   Total Bilirubin 0.3 - 1.2 mg/dL 0.4  0.3  0.5   Alkaline Phos 38 - 126 U/L 129  150  140   AST 15 - 41 U/L 20  15  15    ALT 0 - 44 U/L 24   21     Lipid Panel     Component Value Date/Time   CHOL 182 10/11/2021 1107   TRIG 136 10/11/2021 1107   HDL 36 (L) 10/11/2021 1107   CHOLHDL 5.3 (H)  10/13/2020 0930   CHOLHDL 6.2 03/09/2018 1147   VLDL 38 03/09/2018 1147   LDLCALC 121 (H) 10/11/2021 1107    CBC    Component Value Date/Time   WBC 4.3 09/22/2022 0524   RBC 4.85 09/22/2022 0524   HGB 14.6 09/22/2022 0524   HGB 13.3 02/17/2022 1520   HCT 42.8 09/22/2022 0524   HCT 39.2 02/17/2022 1520   PLT 284 09/22/2022 0524   PLT 336 02/17/2022 1520   MCV 88.2 09/22/2022 0524   MCV 89 02/17/2022 1520   MCH 30.1 09/22/2022 0524   MCHC 34.1 09/22/2022 0524   RDW 12.1 09/22/2022 0524   RDW 12.5 02/17/2022 1520   LYMPHSABS 1.9 09/22/2022 0524   LYMPHSABS 2.1 02/17/2022 1520   MONOABS 0.5 09/22/2022 0524   EOSABS 0.3 09/22/2022 0524   EOSABS 0.3 02/17/2022 1520   BASOSABS 0.0 09/22/2022 0524   BASOSABS 0.1 02/17/2022 1520    Lab Results  Component Value Date   HGBA1C 10.8 (A) 03/08/2023    Lab Results  Component Value Date   TSH 3.050 10/13/2020    Assessment & Plan:  1. Type 2 diabetes mellitus with complication, without long-term current use of insulin (HCC) Uncontrolled with A1c  of 10.8, goal is less than 7.0 Medication nonadherence strongly contributing She is  nonchalant regarding this and I have discussed with her the complications of uncontrolled diabetes mellitus - POCT glycosylated hemoglobin (Hb A1C) - Microalbumin/Creatinine Ratio, Urine - Basic Metabolic Panel  2. Long term current use of oral hypoglycemic drug See #1 above  3. Long term current use of insulin (HCC) See #1 above  4. Non-adherence to medical treatment She continues to be nonadherent with her medication regimen  5. Hair loss Recent stresses could contribute to this Will check thyroid labs - T4, free - TSH - T3  6. Furuncle - cephALEXin (KEFLEX) 500 MG capsule; Take 1 capsule (500 mg total) by mouth 2 (two) times daily.  Dispense: 14 capsule; Refill: 0  7. Chronic dental pain - Ambulatory referral to Dentistry  8. Screening for colon cancer - Fecal occult blood,  imunochemical  9. Left lower quadrant abdominal pain Will need to exclude diverticulitis - CT Abdomen Pelvis W Contrast; Future  10. Need for hepatitis C screening test - HCV Ab w Reflex to Quant PCR    Meds ordered this encounter  Medications   cephALEXin (KEFLEX) 500 MG capsule    Sig: Take 1 capsule (500 mg total) by mouth 2 (two) times daily.    Dispense:  14 capsule    Refill:  0    Follow-up: Return in about 3 months (around 06/08/2023) for Chronic medical conditions.       Hoy Register, MD, FAAFP. [facility] [place], [place] [phone]   03/08/2023, 12:37 PM

## 2023-03-08 NOTE — Telephone Encounter (Signed)
Patient needs psychotherapy.  Ongoing depression, she declines taking Prozac.  Son committed suicide 3 years ago and she recently lost both parents.  Per your last note she denied being a patient of [facility] but she denies knowledge of the call and is open to therapy. Thanks

## 2023-03-08 NOTE — Patient Instructions (Signed)
Complicaciones frecuentes de la diabetes En este video, aprender [employer] se presentan con frecuencia cuando no se controla debidamente la diabetes. Aprender cmo evitar estas complicaciones o reducir su gravedad a travs de cambios en el estilo de vida y manteniendo un cuidadoso control de la glucosa sangunea. To view the content, go to this web address: [email or web address]  This video will expire on: 11/16/2024. If you need access to this video following this date, please reach out to the healthcare provider who assigned it to you. This information is not intended to replace advice given to you by your health care provider. Make sure you discuss any questions you have with your health care provider. Elsevier Patient Education  2024 [employer].

## 2023-03-08 NOTE — Progress Notes (Signed)
Abdominal pain Bump in vaginal area Hair loss

## 2023-03-09 LAB — TSH: TSH: 2.95 u[IU]/mL (ref 0.450–4.500)

## 2023-03-09 LAB — BASIC METABOLIC PANEL
BUN/Creatinine Ratio: 17 (ref 9–23)
BUN: 8 mg/dL (ref 6–24)
CO2: 21 mmol/L (ref 20–29)
Calcium: 8.9 mg/dL (ref 8.7–10.2)
Chloride: 98 mmol/L (ref 96–106)
Creatinine, Ser: 0.47 mg/dL — ABNORMAL LOW (ref 0.57–1.00)
Glucose: 299 mg/dL — ABNORMAL HIGH (ref 70–99)
Potassium: 4.2 mmol/L (ref 3.5–5.2)
Sodium: 133 mmol/L — ABNORMAL LOW (ref 134–144)
eGFR: 118 mL/min/{1.73_m2} (ref >59)

## 2023-03-09 LAB — T3: T3, Total: 126 ng/dL (ref 71–180)

## 2023-03-09 LAB — T4, FREE: Free T4: 1.12 ng/dL (ref 0.82–1.77)

## 2023-03-09 LAB — HCV INTERPRETATION

## 2023-03-09 LAB — HCV AB W REFLEX TO QUANT PCR: HCV Ab: NONREACTIVE

## 2023-03-13 NOTE — Telephone Encounter (Signed)
#  [id number] Interpreter: LCSWA called patient today to introduce herself and to assess patients' mental health needs. Patient was referred by PCP for depression. Pt was able to get on LCSWA schedule for July 3rd.

## 2023-03-22 ENCOUNTER — Institutional Professional Consult (permissible substitution): Payer: Self-pay | Admitting: Licensed Clinical Social Worker

## 2023-03-22 ENCOUNTER — Telehealth: Payer: Self-pay | Admitting: Licensed Clinical Social Worker

## 2023-03-22 NOTE — Telephone Encounter (Signed)
Copied from CRM [id number]. Topic: Appointment Scheduling - Scheduling Inquiry for Clinic >> Mar 22, 2023  1:38 PM Franchot Heidelberg wrote: Reason for CRM: Pt called and needs to reschedule her appt today for [facility]. Please advise

## 2023-03-27 ENCOUNTER — Telehealth: Payer: Self-pay | Admitting: Licensed Clinical Social Worker

## 2023-03-27 NOTE — Telephone Encounter (Signed)
Using Interpreter [phone] Called client to reschedule her missed appointment.

## 2023-03-29 ENCOUNTER — Ambulatory Visit: Payer: Self-pay | Attending: Licensed Clinical Social Worker | Admitting: Licensed Clinical Social Worker

## 2023-03-29 DIAGNOSIS — F411 Generalized anxiety disorder: Secondary | ICD-10-CM

## 2023-03-29 DIAGNOSIS — F4321 Adjustment disorder with depressed mood: Secondary | ICD-10-CM

## 2023-03-29 DIAGNOSIS — Z634 Disappearance and death of family member: Secondary | ICD-10-CM

## 2023-04-03 NOTE — BH Specialist Note (Signed)
Integrated Behavioral Health Initial In-Person Visit  MRN: [id number] Name: Alexandra Aguirre  Number of Integrated Behavioral Health Clinician visits: 1- Initial Visit  Session Start time: 1055    Session End time: 1153  Total time in minutes: 58   Types of Service: Individual psychotherapy  Interpretor:Yes.   Interpretor Name and Language: Spanish interpretor    Warm Hand Off Completed.    Subjective: Alexandra Aguirre is a 47 y.o. female accompanied by  himself and interpretor Patient was referred by PCP for Depression and anxiety. Patient reports the following symptoms/concerns: symptoms of depression, tired, always worried about her family and their mental health. Duration of problem: for about 5 years; Severity of problem: moderate  Objective: Mood: Anxious and Depressed and Affect: Appropriate, Depressed, and Tearful Risk of harm to self or others: No plan to harm self or others  Life Context: Family and Social: Pt lives with her husband and sons. Pt has family in another country as well School/Work: n/a Self-Care: church and her religion  Life Changes: pt recently lost her mother and father, and her son about 2 years ago to self harm.   Patient and/or Family's Strengths/Protective Factors: Concrete supports in place (healthy food, safe environments, etc.)  Goals Addressed: Patient will: Reduce symptoms of: anxiety and depression Increase knowledge and/or ability of: coping skills and stress reduction  Demonstrate ability to: Increase healthy adjustment to current life circumstances and Begin healthy grieving over loss  Progress towards Goals: Ongoing  Interventions: Interventions utilized: Mindfulness or Relaxation Training and Supportive Counseling  Standardized Assessments completed: GAD-7 and PHQ 9  Patient and/or Family Response: pt shared several stories of things that her and her family have been through. Unfortunately, she witnessed her son  committing suicide. This has been a huge impact on her and her husband and other children. There is a family history of depression.  Patient Centered Plan: Patient is on the following Treatment Plan(s):  Depression  Assessment: Patient currently experiencing depressions and sadness, worry that something will happen to her family if they don't get help.   Patient may benefit from grief counseling and family counseling.  Plan: Follow up with behavioral health clinician on : 4 weeks Behavioral recommendations: grief counseling and family counseling and remain getting support from her church  Referral(s): Integrated [employer] (In Clinic), Counselor, and grief counselor Mickie Kay [phone] Mondays at 6 pm.  "From scale of 1-10, how likely are you to follow plan?": 10  Vassie Loll, LCSWA

## 2023-04-06 ENCOUNTER — Encounter (HOSPITAL_COMMUNITY): Payer: Self-pay | Admitting: *Deleted

## 2023-04-06 ENCOUNTER — Ambulatory Visit (INDEPENDENT_AMBULATORY_CARE_PROVIDER_SITE_OTHER): Payer: Self-pay

## 2023-04-06 ENCOUNTER — Ambulatory Visit (HOSPITAL_COMMUNITY)
Admission: EM | Admit: 2023-04-06 | Discharge: 2023-04-06 | Disposition: A | Discharge status: Home / Self Care | Payer: Self-pay | Attending: Physician Assistant | Admitting: Physician Assistant

## 2023-04-06 ENCOUNTER — Ambulatory Visit: Payer: Self-pay

## 2023-04-06 DIAGNOSIS — S9030XA Contusion of unspecified foot, initial encounter: Secondary | ICD-10-CM

## 2023-04-06 DIAGNOSIS — S90129A Contusion of unspecified lesser toe(s) without damage to nail, initial encounter: Secondary | ICD-10-CM

## 2023-04-06 DIAGNOSIS — S91209A Unspecified open wound of unspecified toe(s) with damage to nail, initial encounter: Secondary | ICD-10-CM

## 2023-04-06 DIAGNOSIS — F4321 Adjustment disorder with depressed mood: Secondary | ICD-10-CM

## 2023-04-06 DIAGNOSIS — W57XXXA Bitten or stung by nonvenomous insect and other nonvenomous arthropods, initial encounter: Secondary | ICD-10-CM

## 2023-04-06 DIAGNOSIS — L089 Local infection of the skin and subcutaneous tissue, unspecified: Secondary | ICD-10-CM

## 2023-04-06 DIAGNOSIS — S20461A Insect bite (nonvenomous) of right back wall of thorax, initial encounter: Secondary | ICD-10-CM

## 2023-04-06 DIAGNOSIS — Z23 Encounter for immunization: Secondary | ICD-10-CM

## 2023-04-06 MED ORDER — TETANUS-DIPHTH-ACELL PERTUSSIS 5-2.5-18.5 LF-MCG/0.5 IM SUSY
PREFILLED_SYRINGE | INTRAMUSCULAR | Status: AC
  Filled 2023-04-06: qty 0.5

## 2023-04-06 MED ORDER — DOXYCYCLINE HYCLATE 100 MG PO TABS
100.0000 mg | ORAL_TABLET | Freq: Two times a day (BID) | ORAL | 0 refills | Status: AC
  Filled 2023-04-06: qty 28, 14d supply, fill #0

## 2023-04-06 MED ORDER — TETANUS-DIPHTH-ACELL PERTUSSIS 5-2.5-18.5 LF-MCG/0.5 IM SUSY
0.5000 mL | PREFILLED_SYRINGE | Freq: Once | INTRAMUSCULAR | Status: AC
  Administered 2023-04-06: 0.5 mL via INTRAMUSCULAR

## 2023-04-06 MED ORDER — MUPIROCIN 2 % EX OINT
1.0000 | TOPICAL_OINTMENT | Freq: Two times a day (BID) | CUTANEOUS | 0 refills | Status: AC
  Filled 2023-04-06: qty 22, 11d supply, fill #0

## 2023-04-06 MED ORDER — IBUPROFEN 600 MG PO TABS
600.0000 mg | ORAL_TABLET | Freq: Three times a day (TID) | ORAL | 0 refills | Status: AC | PRN
  Filled 2023-04-06: qty 30, 10d supply, fill #0

## 2023-04-06 NOTE — Discharge Instructions (Addendum)
Your x-rays were normal with no evidence for fracture.  This is great news.  Take ibuprofen 600 mg up to 3 times a day for pain relief.  Do not take NSAIDs with this medication including aspirin, ibuprofen/Advil, naproxen/Aleve.  You can use Tylenol/acetaminophen for additional pain relief.  We are going to use doxycycline as this will cover for a skin infection.  Take this twice daily for 14 days.  Stay out of the sun while on this medication.  I would like you to follow-up with podiatry (foot specialist) given your history of diabetes to make sure that this is healing appropriately.  Keep this area clean with soap and water and apply Bactroban ointment with dressing changes twice daily.  If anything worsens you have drainage, fever, redness, swelling, increasing pain, fever you need to be seen immediately.  Keep the tick bite clean.  We are using doxycycline as this would also cover for tickborne illness.  Stay out of the sun while on this medication.  If you have any worsening symptoms including heart racing, body aches, fever, spread of rash you need to be seen immediately.  Follow-up with your primary care within a week.  I am so sorry for all of your loss.  Please follow-up with a psychologist as you are scheduled.  Follow-up with your primary care as well.  If anything worsens be seen immediately.  Sus radiografas fueron normales sin evidencia de Surveyor, minerals.  Esta es una gran noticia.  Tome ibuprofeno 600 mg hasta 3 veces al da para Engineer, materials.  No tome AINE con este medicamento, incluidos aspirina, ibuprofeno/Advil, naproxeno/Aleve.  Puede utilizar Tylenol/acetaminofeno para Paramedic an ms Chief Technology Officer.  Vamos a Chemical engineer doxiciclina ya que cubrir una infeccin de la piel.  Tome [employer] veces al da durante [address]  Mantngase alejado del sol mientras toma este medicamento.  Me gustara que hiciera un seguimiento con podlogo (especialista en pies) dado su historial de diabetes para asegurarse de  que esta est sanando adecuadamente.  Mantenga esta rea limpia con agua y [place] y aplique ungento Bactroban con cambios de vendaje dos veces al da.  Si algo empeora, tiene secrecin, fiebre, enrojecimiento, hinchazn, dolor creciente y [place], debe ser atendido de inmediato.  Mantenga limpia la picadura de la garrapata.  Estamos usando doxiciclina ya que esto tambin cubrira las enfermedades transmitidas por garrapatas.  Mantngase alejado del sol mientras toma este medicamento.  Si tiene algn sntoma que empeora, como palpitaciones, dolores corporales, fiebre o sarpullido extendido, debe ser atendido de inmediato.  Haga un seguimiento con su atencin primaria dentro de [place].  Lamento mucho toda tu prdida.  Por favor haga un seguimiento con un psiclogo segn lo programado.  Haga un seguimiento con su atencin primaria tambin.  Si algo empeora avise inmediatamente.

## 2023-04-06 NOTE — ED Triage Notes (Signed)
Adult nurse service used for clinical intake.   Pt states she had a tick on her back 4 days ago now the area is itching.   She states that she has a left 3rd digit toe injury. She states metal fell on her toe and the toenail came off 2 days ago. She is taking IBU  She has a couple of spots on her chest and ABD that came after [place] of [place].

## 2023-04-06 NOTE — Telephone Encounter (Signed)
  Chief Complaint: Infected looking tick bite, HA and possible fever. Also has injured both feet.  Symptoms: Fever, redness, lost toenail,  Frequency: Past few days Pertinent Negatives: Patient denies  Disposition: [] ED /[x] Urgent Care (no appt availability in office) / [] Appointment(In office/virtual)/ []  [facility]/ [] Home Care/ [] Refused Recommended Disposition /[] [facility]/ []  Follow-up with PCP Additional Notes: Pt's husband found an engorged large tick on pt's back and removed it. Area now looks infected. Pt had another person's Amoxicillin and has started taking it. Pt states she has chills, possible fever and woke up with a HA.  Pt dropped a metal bar on one foot and plywood on the other. She has lost a toenail and foot is strange color.    No appts available in office.  Pt will go to UC for care today.   Reason for Disposition  [1] 2 to 14 days following tick bite AND [2] widespread rash or headache AND [3] no fever  Answer Assessment - Initial Assessment Questions 1. ATTACHED:  "Is the tick still on the skin?"  (e.g., yes, no, unsure)     no 2. ONSET - TICK STILL ATTACHED:  "How long do you think the tick has been on your skin?" (e.g., hours, days, unsure)  Note:  Is there a recent activity (camping, hiking) where the caller may have been exposed?     unsure 3. ONSET - TICK NOT STILL ATTACHED: "If the tick has been removed, how long do you think the tick was attached before you removed it?" (e.g., 5 hours, 2 days). "When was this?"     unsure 4. LOCATION: "Where is the tick bite located?" (e.g., arm, leg)     Back 5. TYPE of TICK: "Is it a wood tick or a deer tick?" (e.g., deer tick, wood tick; unsure)     Unsure 6. SIZE of TICK: "How big is the tick?" (e.g., size of poppy seed, apple seed, watermelon seed; unsure) Note: Deer ticks can be the size of a poppy seed (nymph) or an apple seed (adult).       Bigger 7. ENGORGED: "Did the tick look flat or  engorged (full, swollen)?" (e.g., flat, engorged; unsure)     Yes - engorged 8. OTHER SYMPTOMS: "Do you have any other symptoms?" (e.g., fever, rash, redness at bite area, red ring around bite)     Infected - red/purple  Protocols used: Tick Bite-A-AH

## 2023-04-06 NOTE — ED Provider Notes (Addendum)
[facility]    CSN: [id number] Arrival date & time: 04/06/23  1631      History   Chief Complaint No chief complaint on file.   HPI Alexandra Aguirre is a 47 y.o. female.   Patient presents today with several concerns.  She is Spanish-speaking but interpreter was utilized during visit.  She reports that approximately 4 to 5 days ago she noticed some itching placed on her back.  She had her husband look at this and he removed a tick.  She believes that it was removed in its entirety.  It is since become more itchy and developed redness around it.  She is also developed a headache, subjective fever, body aches.  She has tried ibuprofen with temporary improvement of symptoms.  She is also taken several doses of tetracycline that she got from her last trip to [place].  She denies history of tickborne illness.  She is able to perform her daily duties despite symptoms.  In addition, patient reports that 2 days ago she was lifting a metal object when it fell onto her left third toe and right second toe.  She has had ongoing pain since that time.  This is rated 8/9 on 0-10 pain scale, described as throbbing/aching, no aggravating or alleviating factors identified.  She reports that the toenail came off on the right foot due to the injury.  She does have a history of diabetes and her blood sugars are not well-controlled; her last A1c in June 2024 was 10.8%.  She denies any recent antibiotics outside of the 2 doses of tetracycline she is taking recently.  She is having difficulty walking because of the pain.  At the end of the visit patient reported that she occasionally has trouble remembering what she was doing and has to focus on reminding herself what her to do list is.  She wonders if this could be related to the diabetes.  She is also concerned that it could be related to all the recent traumas.  Reports that her son killed himself in front of her and then she has also lost her mother  and father recently.  She does have a primary care and has reached out to them about the symptoms and they are arranging therapy.  She denies any thoughts of suicide/self-harm or trying to harm anyone else.    Past Medical History:  Diagnosis Date   Abnormal Pap smear of cervix 09/24/2013   AGUS   Acute non-recurrent maxillary sinusitis 11/01/2018   Acute tonsillitis 11/01/2018   Asthma    Diabetes mellitus without complication (HCC)    Environmental allergies    GERD (gastroesophageal reflux disease)    Non-recurrent acute suppurative otitis media of left ear without spontaneous rupture of tympanic membrane 11/01/2018    Patient Active Problem List   Diagnosis Date Noted   Grief at loss of child 02/17/2022   Seasonal allergies 02/17/2022   Diabetic polyneuropathy associated with type 2 diabetes mellitus (HCC) 02/17/2022   Boil of trunk 10/06/2021   Amenorrhea 10/06/2021   Bacterial vaginitis 08/27/2020   POP-Q stage 2 cystocele 09/20/2018   Fatty liver 06/14/2018   Hyperlipidemia 04/04/2018   Dental caries 06/21/2017   Vitamin D deficiency 03/01/2017   Type 2 diabetes mellitus with complication, without long-term current use of insulin (HCC) 10/12/2016   Gastroesophageal reflux disease 10/12/2016   Generalized anxiety disorder 10/12/2016   Asthma 01/15/2014   Vertigo 11/25/2013   Pedal edema 09/30/2013   Migraine  headache 09/30/2013   Gastritis and gastroduodenitis 09/30/2013   Back pain 09/30/2013    Past Surgical History:  Procedure Laterality Date   CERVICAL BIOPSY  W/ LOOP ELECTRODE EXCISION     removal of cyst Right 2011    OB History     Gravida  6   Para  5   Term  5   Preterm      AB  1   Living  4      SAB  1   IAB      Ectopic      Multiple      Live Births  5            Home Medications    Prior to Admission medications   Medication Sig Start Date End Date Taking? Authorizing Provider  atorvastatin (LIPITOR) 80 MG tablet Take 1  tablet (80 mg total) by mouth daily. 12/07/22  Yes Hoy Register, MD  cetirizine (ZYRTEC) 10 MG tablet Take 1 tablet (10 mg total) by mouth daily. 12/07/22  Yes Hoy Register, MD  doxycycline (VIBRAMYCIN) 100 MG capsule Take 1 capsule (100 mg total) by mouth 2 (two) times daily for 14 days. 04/06/23 04/20/23 Yes Shantoria Ellwood K, PA-C  glipiZIDE (GLUCOTROL) 10 MG tablet Take 1 tablet (10 mg total) by mouth 2 (two) times daily before a meal. 12/07/22  Yes Newlin, Enobong, MD  glucose blood (TRUE METRIX BLOOD GLUCOSE TEST) test strip Use as directed to check blood sugar twice daily. 02/17/22  Yes Mayers, Cari S, PA-C  ibuprofen (ADVIL) 600 MG tablet Take 1 tablet (600 mg total) by mouth every 8 (eight) hours as needed. 04/06/23  Yes Kelsey Edman, Denny Peon K, PA-C  metFORMIN (GLUCOPHAGE) 500 MG tablet Take 2 tablets (1,000 mg total) by mouth 2 (two) times daily with a meal. 12/07/22  Yes Newlin, Enobong, MD  mupirocin ointment (BACTROBAN) 2 % Apply 1 Application topically 2 (two) times daily. 04/06/23  Yes Sumaiya Arruda K, PA-C  TRUEplus Lancets 28G MISC Use as directed to check blood sugar twice daily. 02/17/22  Yes Mayers, Cari S, PA-C  albuterol (VENTOLIN HFA) 108 (90 Base) MCG/ACT inhaler Inhale 2 puffs into the lungs every 6 (six) hours as needed for wheezing or shortness of breath. 12/07/22   Hoy Register, MD  insulin glargine (LANTUS SOLOSTAR) 100 UNIT/ML Solostar Pen Inject 15 Units into the skin daily. 12/07/22   Hoy Register, MD  Insulin Pen Needle (PEN NEEDLES 31GX5/16") 31G X 8 MM MISC 1 Device by Does not apply route 5 (five) times daily. 11/03/20   Venora Maples, MD  pantoprazole (PROTONIX) 40 MG tablet TAKE 1 TABLET (40 MG TOTAL) BY MOUTH DAILY. Patient taking differently: Take 40 mg by mouth daily. 02/17/22   Mayers, Kasandra Knudsen, PA-C    Family History Family History  Problem Relation Age of Onset   Cancer Mother        cervical/ovarian   Diabetes Mother    Diabetes Maternal Uncle    Diabetes Brother     Diabetes Maternal Uncle    Diabetes Maternal Uncle     Social History Social History   Tobacco Use   Smoking status: Never   Smokeless tobacco: Never  Vaping Use   Vaping status: Never Used  Substance Use Topics   Alcohol use: No   Drug use: No     Allergies   Fruit & vegetable daily [nutritional supplements]   Review of Systems Review of Systems  Constitutional:  Positive for activity change and fever. Negative for appetite change and fatigue.  Respiratory:  Negative for cough and shortness of breath.   Cardiovascular:  Negative for chest pain.  Gastrointestinal:  Negative for abdominal pain, diarrhea, nausea and vomiting.  Musculoskeletal:  Positive for arthralgias and myalgias.  Skin:  Positive for wound. Negative for color change.  Neurological:  Negative for dizziness, light-headedness and headaches.  Psychiatric/Behavioral:  Negative for confusion, dysphoric mood and suicidal ideas.      Physical Exam Triage Vital Signs ED Triage Vitals  Encounter Vitals Group     BP 04/06/23 1812 108/69     Systolic BP Percentile --      Diastolic BP Percentile --      Pulse Rate 04/06/23 1812 86     Resp 04/06/23 1812 18     Temp 04/06/23 1812 97.9 F (36.6 C)     Temp Source 04/06/23 1812 Oral     SpO2 04/06/23 1812 97 %     Weight --      Height --      Head Circumference --      Peak Flow --      Pain Score 04/06/23 1808 9     Pain Loc --      Pain Education --      Exclude from Growth Chart --    No data found.  Updated Vital Signs BP 108/69 (BP Location: Left Arm)   Pulse 86   Temp 97.9 F (36.6 C) (Oral)   Resp 18   LMP 04/06/2023 (Exact Date)   SpO2 97%   Visual Acuity Right Eye Distance:   Left Eye Distance:   Bilateral Distance:    Right Eye Near:   Left Eye Near:    Bilateral Near:     Physical Exam Vitals reviewed.  Constitutional:      General: She is awake. She is not in acute distress.    Appearance: Normal appearance. She is  well-developed. She is not ill-appearing.     Comments: Very pleasant female appears stated age in no acute distress sitting comfortably in exam room  HENT:     Head: Normocephalic and atraumatic.  Cardiovascular:     Rate and Rhythm: Normal rate and regular rhythm.     Heart sounds: Normal heart sounds, S1 normal and S2 normal. No murmur heard.    Comments: Capillary fill within 2 seconds bilateral toes Pulmonary:     Effort: Pulmonary effort is normal.     Breath sounds: Normal breath sounds. No wheezing, rhonchi or rales.     Comments: Clear to auscultation bilaterally Abdominal:     Palpations: Abdomen is soft.     Tenderness: There is no abdominal tenderness.  Musculoskeletal:     Right lower leg: No edema.     Left lower leg: No edema.  Feet:     Right foot:     Protective Sensation: 10 sites tested.  10 sites sensed.     Toenail Condition: Right toenails are abnormally thick.     Left foot:     Protective Sensation: 10 sites tested.  10 sites sensed.     Toenail Condition: Left toenails are abnormally thick.     Comments: Right foot: Complete nail avulsion noted right third toe.  Significant tenderness palpation over distal right third toe.  No deformity noted.  Foot neurovascularly intact.  Left toe: Thick toenails.  Tenderness palpation over distal second toe.  No deformity noted.  Foot  neurovascularly intact. Skin:         Comments: 1 cm x 1 cm wound with surrounding erythema noted right thoracic back.  No obvious erythema migrans.  Evidence of excoriation.  Psychiatric:        Mood and Affect: Affect is tearful.        Behavior: Behavior is cooperative.     Comments: Appropriate speech.  Normal appearance/hygiene.  Tearful when discussing the loss of her family members.      UC Treatments / Results  Labs (all labs ordered are listed, but only abnormal results are displayed) Labs Reviewed - No data to display  EKG   Radiology DG Toe 2nd Right  Result Date:  04/06/2023 CLINICAL DATA:  Right second toe crush injury EXAM: RIGHT SECOND TOE COMPARISON:  None Available. FINDINGS: There is no evidence of fracture or dislocation. There is no evidence of arthropathy or other focal bone abnormality. Soft tissues are unremarkable. IMPRESSION: Negative. Electronically Signed   By: Helyn Numbers M.D.   On: 04/06/2023 19:14   DG Toe 3rd Left  Result Date: 04/06/2023 CLINICAL DATA:  Left third toe crush injury EXAM: LEFT THIRD TOE COMPARISON:  None Available. FINDINGS: There is no evidence of fracture or dislocation. There is no evidence of arthropathy or other focal bone abnormality. Soft tissues are unremarkable. IMPRESSION: Negative. Electronically Signed   By: Helyn Numbers M.D.   On: 04/06/2023 19:14    Procedures Procedures (including critical care time)  Medications Ordered in UC Medications  Tdap (BOOSTRIX) injection 0.5 mL (0.5 mLs Intramuscular Given 04/06/23 1850)    Initial Impression / Assessment and Plan / UC Course  I have reviewed the triage vital signs and the nursing notes.  Pertinent labs & imaging results that were available during my care of the patient were reviewed by me and considered in my medical decision making (see chart for details).     Patient is well-appearing, afebrile, nontoxic, nontachycardic.  Tick appears to be removed in its entirety.  Concern for possible tickborne illness such as Lyme disease given her clinical presentation including arthralgias, headache, subjective fever.  Lyme titers were deferred as she is only been bit by the tick within the past week.  Will treat with doxycycline 100 mg twice daily for 2 weeks.  Discussed that she is to avoid prolonged sun exposure while on this medication due to photosensitivity.  Recommend close follow-up with her primary care.  She was given ibuprofen to help manage arthralgias and we discussed that she is not to take NSAIDs with this medication due to risk of GI bleeding.  She  can use Tylenol for breakthrough pain.  Discussed that if she has any worsening or changing symptoms she needs to be seen immediately to which she expressed understanding.  X-ray of affected toes were obtained given recent trauma that showed no acute osseous abnormality.  No obvious infection on physical exam but we did discuss that she is at high risk for infection given her uncontrolled diabetes.  Doxycycline should cover for skin infection.  She was encouraged to keep area clean and apply Bactroban ointment with dressing changes.  Tetanus was updated today.  She was given ibuprofen for pain relief with instruction not to take NSAIDs with this medication but can take Tylenol for breakthrough pain.  Given injury and history of diabetes with open wound I did recommend she follow-up with podiatry.  She was given contact information for local provider with instruction to call to schedule  an appointment.  Discussed that if she has any worsening or changing symptoms she should return for reevaluation.  We discussed that depression and grief can lead to memory issues.  Encouraged her to follow-up with psychologist as arranged through her primary care.  She denies any thoughts of suicide or self-harm or harming others and contracts for safety today.  We did discuss that if she has any severe symptoms she can follow-up with the behavioral health urgent care.  Strict return precautions given to which she expressed understanding.  Final Clinical Impressions(s) / UC Diagnoses   Final diagnoses:  Contusion of foot including toes, unspecified laterality, initial encounter  Avulsion of toenail, initial encounter  Tick bite of right back wall of thorax, initial encounter  Skin infection  Grief     Discharge Instructions      Your x-rays were normal with no evidence for fracture.  This is great news.  Take ibuprofen 600 mg up to 3 times a day for pain relief.  Do not take NSAIDs with this medication including  aspirin, ibuprofen/Advil, naproxen/Aleve.  You can use Tylenol/acetaminophen for additional pain relief.  We are going to use doxycycline as this will cover for a skin infection.  Take this twice daily for 14 days.  Stay out of the sun while on this medication.  I would like you to follow-up with podiatry (foot specialist) given your history of diabetes to make sure that this is healing appropriately.  Keep this area clean with soap and water and apply Bactroban ointment with dressing changes twice daily.  If anything worsens you have drainage, fever, redness, swelling, increasing pain, fever you need to be seen immediately.  Keep the tick bite clean.  We are using doxycycline as this would also cover for tickborne illness.  Stay out of the sun while on this medication.  If you have any worsening symptoms including heart racing, body aches, fever, spread of rash you need to be seen immediately.  Follow-up with your primary care within a week.  I am so sorry for all of your loss.  Please follow-up with a psychologist as you are scheduled.  Follow-up with your primary care as well.  If anything worsens be seen immediately.  Sus radiografas fueron normales sin evidencia de Surveyor, minerals.  Esta es una gran noticia.  Tome ibuprofeno 600 mg hasta 3 veces al da para Engineer, materials.  No tome AINE con este medicamento, incluidos aspirina, ibuprofeno/Advil, naproxeno/Aleve.  Puede utilizar Tylenol/acetaminofeno para Paramedic an ms Chief Technology Officer.  Vamos a Chemical engineer doxiciclina ya que cubrir una infeccin de la piel.  Tome [employer] veces al da durante [address]  Mantngase alejado del sol mientras toma este medicamento.  Me gustara que hiciera un seguimiento con podlogo (especialista en pies) dado su historial de diabetes para asegurarse de que esta est sanando adecuadamente.  Mantenga esta rea limpia con agua y [place] y aplique ungento Bactroban con cambios de vendaje dos veces al da.  Si algo empeora, tiene secrecin,  fiebre, enrojecimiento, hinchazn, dolor creciente y [place], debe ser atendido de inmediato.  Mantenga limpia la picadura de la garrapata.  Estamos usando doxiciclina ya que esto tambin cubrira las enfermedades transmitidas por garrapatas.  Mantngase alejado del sol mientras toma este medicamento.  Si tiene algn sntoma que empeora, como palpitaciones, dolores corporales, fiebre o sarpullido extendido, debe ser atendido de inmediato.  Haga un seguimiento con su atencin primaria dentro de [place].  Lamento mucho toda tu prdida.  Por favor haga un seguimiento con un psiclogo segn lo programado.  Haga un seguimiento con su atencin primaria tambin.  Si algo empeora avise inmediatamente.     ED Prescriptions     Medication Sig Dispense Auth. Provider   doxycycline (VIBRAMYCIN) 100 MG capsule Take 1 capsule (100 mg total) by mouth 2 (two) times daily for 14 days. 28 capsule Jerrick Farve K, PA-C   mupirocin ointment (BACTROBAN) 2 % Apply 1 Application topically 2 (two) times daily. 22 g Deontae Robson K, PA-C   ibuprofen (ADVIL) 600 MG tablet Take 1 tablet (600 mg total) by mouth every 8 (eight) hours as needed. 30 tablet Randee Upchurch, Noberto Retort, PA-C      PDMP not reviewed this encounter.   Jeani Hawking, PA-C 04/06/23 1944    Shanise Balch, Noberto Retort, PA-C 04/06/23 1945

## 2023-04-07 ENCOUNTER — Other Ambulatory Visit: Payer: Self-pay

## 2023-04-07 NOTE — Telephone Encounter (Signed)
Noted . Patient in ED. 

## 2023-05-02 ENCOUNTER — Ambulatory Visit (HOSPITAL_COMMUNITY)
Admission: RE | Admit: 2023-05-02 | Discharge: 2023-05-02 | Disposition: A | Discharge status: Home / Self Care | Payer: Self-pay | Source: Ambulatory Visit | Attending: Family Medicine | Admitting: Family Medicine

## 2023-05-02 DIAGNOSIS — R1032 Left lower quadrant pain: Secondary | ICD-10-CM | POA: Insufficient documentation

## 2023-05-02 LAB — POCT I-STAT CREATININE: Creatinine, Ser: 0.3 mg/dL — ABNORMAL LOW (ref 0.44–1.00)

## 2023-05-02 MED ORDER — IOHEXOL 350 MG/ML SOLN
75.0000 mL | Freq: Once | INTRAVENOUS | Status: AC | PRN
  Administered 2023-05-02: 75 mL via INTRAVENOUS

## 2023-05-03 ENCOUNTER — Other Ambulatory Visit: Payer: Self-pay

## 2023-05-03 ENCOUNTER — Ambulatory Visit: Payer: Self-pay | Attending: Licensed Clinical Social Worker | Admitting: Licensed Clinical Social Worker

## 2023-05-03 DIAGNOSIS — Z5941 Food insecurity: Secondary | ICD-10-CM

## 2023-05-03 DIAGNOSIS — F4321 Adjustment disorder with depressed mood: Secondary | ICD-10-CM

## 2023-05-03 DIAGNOSIS — Z634 Disappearance and death of family member: Secondary | ICD-10-CM

## 2023-05-03 DIAGNOSIS — F411 Generalized anxiety disorder: Secondary | ICD-10-CM

## 2023-05-03 DIAGNOSIS — Z599 Problem related to housing and economic circumstances, unspecified: Secondary | ICD-10-CM

## 2023-05-03 NOTE — BH Specialist Note (Signed)
Integrated Behavioral Health Follow Up In-Person Visit  MRN: [id number] Name: Alexandra Aguirre  Number of Integrated Behavioral Health Clinician visits: 2- Second Visit  Session Start time: 1005   Session End time: 1038  Total time in minutes: 33   Types of Service: Individual psychotherapy  Interpretor:Yes.   Interpretor Name and Language: Spanish  Subjective: Alexandra Aguirre is a 47 y.o. female accompanied by  himself and interpretor Patient was referred by PCP for Depression and anxiety. Patient reports the following symptoms/concerns: symptoms of depression, tired, always worried about her family and their mental health. Duration of problem: for about 5 years; Severity of problem: moderate   Objective: Mood: Euthymic and Affect: Appropriate Risk of harm to self or others: No plan to harm self or others  Life Context: Family and Social: Pt lives with her husband and sons. Pt has family in another country as well School/Work: n/a Self-Care: church and her religion  Life Changes: pt recently lost her mother and father, and her son about 2 years ago to self harm.  Patient and/or Family's Strengths/Protective Factors: Concrete supports in place (healthy food, safe environments, etc.) and Sense of purpose  Goals Addressed: Patient will:  Reduce symptoms of: anxiety and depression   Increase knowledge and/or ability of: coping skills and stress reduction   Demonstrate ability to: Increase healthy adjustment to current life circumstances and Begin healthy grieving over loss  Progress towards Goals: Ongoing  Interventions: Interventions utilized:  Supportive Counseling Standardized Assessments completed: GAD-7 and PHQ 9  Patient and/or Family Response: Patient shared that she is doing ok. She stated that yesterday was the one year anniversary of her mothers death. It was hard for her and her daughter who is back at home. She reports that her daughter often talks about  not living anymore, she is having a difficult time because she was raised by her grandparents. Alexandra Aguirre also explained that her 51 year old son recently lost his finger due to an accident at work. She states that her and her husband are doing well with their grief of their son and her parents. She is open to hearing more self care ways.  Patient Centered Plan: Patient is on the following Treatment Plan(s): Depression   Assessment: Patient currently experiencing anxiousness,. Little interest or pleasure in doing things, always worrying, trouble relaxing.  Patient may benefit from continued support from [facility].  Plan: Follow up with behavioral health clinician on : 4 weeks Behavioral recommendations: start more morning and afternoon walks.  Referral(s): Integrated Art gallery manager (In Clinic) and [facility]:  Food "From scale of 1-10, how likely are you to follow plan?": 7  [address] [place], [address]

## 2023-05-08 ENCOUNTER — Other Ambulatory Visit: Payer: Self-pay | Admitting: Internal Medicine

## 2023-05-08 MED ORDER — POLYETHYLENE GLYCOL 3350 17 GM/SCOOP PO POWD
17.0000 g | Freq: Every day | ORAL | 0 refills | Status: AC | PRN
  Filled 2023-05-08: qty 510, 30d supply, fill #0

## 2023-05-09 ENCOUNTER — Other Ambulatory Visit: Payer: Self-pay

## 2023-05-12 ENCOUNTER — Other Ambulatory Visit: Payer: Self-pay

## 2023-06-05 ENCOUNTER — Ambulatory Visit: Payer: Self-pay | Admitting: Licensed Clinical Social Worker

## 2023-09-11 ENCOUNTER — Ambulatory Visit: Payer: Self-pay | Admitting: *Deleted

## 2023-09-11 NOTE — Telephone Encounter (Signed)
Interpreter [place], [place] # [phone] Chief Complaint: fell Friday and now c/o left knee pain, low abdominal pain and headaches. Denies hitting head. Requesting appt.  Symptoms: fell after trying to stand hitting left knee on floor. Reports laying in bed feeling like bed moving in circles. Waiting for it to stop and then when getting up fell. Low abdominal pain and pain with walking now . Reports headache worsening comes and goes. X 2 weeks . Can be driving and at stop light forgets where to go.  Frequency: Friday . Pertinent Negatives: Patient denies chest pain no difficulty breathing  Disposition: [] ED /[] Urgent Care (no appt availability in office) / [] Appointment(In office/virtual)/ []  [facility]/ [] Home Care/ [] Refused Recommended Disposition /[x]  Mobile Bus/ []  Follow-up with PCP Additional Notes:   No available appt until Jan 22. Recommended mobile unit today due to sx not severe headache. Recommended if sx  worsen go to ED. Patient will try mobile unit. Please advise and call back for scheduling appt. Would like to f/u with PCP.      Reason for Disposition  [1] MODERATE weakness (i.e., interferes with work, school, normal activities) AND [2] new-onset or worsening  Answer Assessment - Initial Assessment Questions 1. MECHANISM: "How did the fall happen?"     Laying in bed and bed moving in circles. When getting up to walk fell hitting left knee and now other areas hurt 2. DOMESTIC VIOLENCE AND ELDER ABUSE SCREENING: "Did you fall because someone pushed you or tried to hurt you?" If Yes, ask: "Are you safe now?"     na 3. ONSET: "When did the fall happen?" (e.g., minutes, hours, or days ago)     Friday 09/08/23 4. LOCATION: "What part of the body hit the ground?" (e.g., back, buttocks, head, hips, knees, hands, head, stomach)     Left knee, low abdomen  5. INJURY: "Did you hurt (injure) yourself when you fell?" If Yes, ask: "What did you injure? Tell me more about  this?" (e.g., body area; type of injury; pain severity)"     Yes left knee , low abdomen. 6. PAIN: "Is there any pain?" If Yes, ask: "How bad is the pain?" (e.g., Scale 1-10; or mild,  moderate, severe)   - NONE (0): No pain   - MILD (1-3): Doesn't interfere with normal activities    - MODERATE (4-7): Interferes with normal activities or awakens from sleep    - SEVERE (8-10): Excruciating pain, unable to do any normal activities      Pain level 8  7. SIZE: For cuts, bruises, or swelling, ask: "How large is it?" (e.g., inches or centimeters)      Left knee 8. PREGNANCY: "Is there any chance you are pregnant?" "When was your last menstrual period?"     na 9. OTHER SYMPTOMS: "Do you have any other symptoms?" (e.g., dizziness, fever, weakness; new onset or worsening).      Pain left knee, headache comes and goes. Treat with ibuprofen with some relief but headache returns . Low abdominal pain  10. CAUSE: "What do you think caused the fall (or falling)?" (e.g., tripped, dizzy spell)       Not sure hx diabetes and has not been checking blood sugars.  Protocols used: Falls and [facility]

## 2023-09-11 NOTE — Telephone Encounter (Addendum)
Spoke with patient. Patient voices she dizzy and when she stands she fall she going to  fall. advised to got to UC based on s/s.  Interpreter # [phone]

## 2024-03-04 ENCOUNTER — Emergency Department (HOSPITAL_COMMUNITY): Payer: Self-pay

## 2024-03-04 ENCOUNTER — Emergency Department (HOSPITAL_COMMUNITY)
Admission: EM | Admit: 2024-03-04 | Discharge: 2024-03-05 | Disposition: A | Discharge status: Home / Self Care | Payer: Self-pay | Attending: Emergency Medicine | Admitting: Emergency Medicine

## 2024-03-04 ENCOUNTER — Other Ambulatory Visit: Payer: Self-pay

## 2024-03-04 ENCOUNTER — Encounter (HOSPITAL_COMMUNITY): Payer: Self-pay

## 2024-03-04 DIAGNOSIS — S80212A Abrasion, left knee, initial encounter: Secondary | ICD-10-CM | POA: Insufficient documentation

## 2024-03-04 DIAGNOSIS — Y92481 Parking lot as the place of occurrence of the external cause: Secondary | ICD-10-CM | POA: Insufficient documentation

## 2024-03-04 DIAGNOSIS — W19XXXA Unspecified fall, initial encounter: Secondary | ICD-10-CM

## 2024-03-04 DIAGNOSIS — Z23 Encounter for immunization: Secondary | ICD-10-CM | POA: Insufficient documentation

## 2024-03-04 DIAGNOSIS — M79605 Pain in left leg: Secondary | ICD-10-CM

## 2024-03-04 DIAGNOSIS — W010XXA Fall on same level from slipping, tripping and stumbling without subsequent striking against object, initial encounter: Secondary | ICD-10-CM | POA: Insufficient documentation

## 2024-03-04 MED ORDER — TETANUS-DIPHTH-ACELL PERTUSSIS 5-2.5-18.5 LF-MCG/0.5 IM SUSY
0.5000 mL | PREFILLED_SYRINGE | Freq: Once | INTRAMUSCULAR | Status: AC
  Administered 2024-03-04: 0.5 mL via INTRAMUSCULAR
  Filled 2024-03-04: qty 0.5

## 2024-03-04 MED ORDER — OXYCODONE-ACETAMINOPHEN 5-325 MG PO TABS
2.0000 | ORAL_TABLET | Freq: Once | ORAL | Status: AC
  Administered 2024-03-04: 2 via ORAL
  Filled 2024-03-04: qty 2

## 2024-03-04 MED ORDER — NAPROXEN 250 MG PO TABS
500.0000 mg | ORAL_TABLET | Freq: Once | ORAL | Status: AC
  Administered 2024-03-04: 500 mg via ORAL
  Filled 2024-03-04: qty 2

## 2024-03-04 MED ORDER — BACITRACIN ZINC 500 UNIT/GM EX OINT
TOPICAL_OINTMENT | Freq: Two times a day (BID) | CUTANEOUS | Status: DC
  Administered 2024-03-04: 1 via TOPICAL
  Filled 2024-03-04: qty 0.9

## 2024-03-04 NOTE — ED Notes (Signed)
Interpreter to bedside.

## 2024-03-04 NOTE — ED Triage Notes (Signed)
 [place] spanish interpretor used to assist with triage . Pt reports mechanical fall , reports she slipped and fell in the parking lot, pt now c/o left knee pain and left foot pain. Open area noted to left knee

## 2024-03-04 NOTE — ED Provider Triage Note (Signed)
 Emergency Medicine Provider Triage Evaluation Note  Alexandra Aguirre , a 48 y.o. female  was evaluated in triage.  Pt complains of patient presented for a mechanical fall when she slipped and fell in the grocery store parking lot.  Patient reports right knee, lower leg, and ankle pain. Spanish interpreter used  Review of Systems  Positive: Pain, injury Negative: Numbness, weakness, deformity  Physical Exam  BP 94/67 (BP Location: Right Arm)   Pulse 85   Temp 98.2 F (36.8 C)   Resp (!) 22   SpO2 99%  Gen:   Awake, no distress   Resp:  Normal effort  MSK:   Moves extremities without difficulty  Other:  Abrasion to anterior left knee, mild swelling noted at left knee.  No obvious deformity or ecchymosis noted  Medical Decision Making  Medically screening exam initiated at 7:34 PM.  Appropriate orders placed.  Alexandra Aguirre was informed that the remainder of the evaluation will be completed by another provider, this initial triage assessment does not replace that evaluation, and the importance of remaining in the ED until their evaluation is complete.  Imaging ordered   Alexandra Aguirre 03/04/24 [id number]

## 2024-03-05 NOTE — ED Provider Notes (Signed)
 [place] [facility] Provider Note   CSN: [id number] Arrival date & time: 03/04/24  1900     Patient presents with: Fall, Knee Pain (left), and Foot Pain (left)   Alexandra Aguirre is a 48 y.o. female.   Patient slipped and fell because of the rain earlier today landing on her left knee with pain to knee/tib/fib/ankle and abrasion/swelling to knee as well. No injuries elsewhere.    Fall  Knee Pain Foot Pain       Prior to Admission medications   Medication Sig Start Date End Date Taking? Authorizing Provider  albuterol  (VENTOLIN  HFA) 108 (90 Base) MCG/ACT inhaler Inhale 2 puffs into the lungs every 6 (six) hours as needed for wheezing or shortness of breath. 12/07/22   Joaquin Mulberry, MD  atorvastatin  (LIPITOR) 80 MG tablet Take 1 tablet (80 mg total) by mouth daily. 12/07/22   Newlin, Enobong, MD  cetirizine  (ZYRTEC ) 10 MG tablet Take 1 tablet (10 mg total) by mouth daily. 12/07/22   Newlin, Enobong, MD  glipiZIDE  (GLUCOTROL ) 10 MG tablet Take 1 tablet (10 mg total) by mouth 2 (two) times daily before a meal. 12/07/22   Newlin, Enobong, MD  glucose blood (TRUE METRIX BLOOD GLUCOSE TEST) test strip Use as directed to check blood sugar twice daily. 02/17/22   Mayers, Cari S, PA-C  ibuprofen  (ADVIL ) 600 MG tablet Take 1 tablet (600 mg total) by mouth every 8 (eight) hours as needed. 04/06/23   Raspet, Betsey Brow, PA-C  insulin  glargine (LANTUS  SOLOSTAR) 100 UNIT/ML Solostar Pen Inject 15 Units into the skin daily. 12/07/22   Newlin, Enobong, MD  Insulin  Pen Needle (PEN NEEDLES 31GX5/16) 31G X 8 MM MISC 1 Device by Does not apply route 5 (five) times daily. 11/03/20   Teena Feast, MD  metFORMIN  (GLUCOPHAGE ) 500 MG tablet Take 2 tablets (1,000 mg total) by mouth 2 (two) times daily with a meal. 12/07/22   Joaquin Mulberry, MD  mupirocin  ointment (BACTROBAN ) 2 % Apply 1 Application topically 2 (two) times daily. 04/06/23   Raspet, Erin K, PA-C   pantoprazole  (PROTONIX ) 40 MG tablet TAKE 1 TABLET (40 MG TOTAL) BY MOUTH DAILY. Patient taking differently: Take 40 mg by mouth daily. 02/17/22   Mayers, Cari S, PA-C  polyethylene glycol powder (GLYCOLAX /MIRALAX ) 17 GM/SCOOP powder Take 17 g by mouth daily as needed for mild constipation. 05/08/23   Lawrance Presume, MD  TRUEplus Lancets 28G MISC Use as directed to check blood sugar twice daily. 02/17/22   Mayers, Cari S, PA-C    Allergies: Fruit & vegetable daily [nutritional supplements]    Review of Systems  Updated Vital Signs BP 114/62 (BP Location: Right Arm)   Pulse 70   Temp (!) 97.3 F (36.3 C) (Temporal)   Resp 20   Ht 5' 2 (1.575 m)   Wt 77.1 kg   SpO2 100%   BMI 31.09 kg/m   Physical Exam Vitals and nursing note reviewed.  Constitutional:      Appearance: She is well-developed.  HENT:     Head: Normocephalic and atraumatic.   Cardiovascular:     Rate and Rhythm: Normal rate and regular rhythm.  Pulmonary:     Effort: No respiratory distress.     Breath sounds: No stridor.  Abdominal:     General: There is no distension.   Musculoskeletal:     Cervical back: Normal range of motion.   Skin:    Comments: Abrasion over  left patella with associated swelling, NVI distally   Neurological:     Mental Status: She is alert.     (all labs ordered are listed, but only abnormal results are displayed) Labs Reviewed - No data to display  EKG: None  Radiology: DG Tibia/Fibula Left Result Date: 03/04/2024 CLINICAL DATA:  Marvell Slider, pain EXAM: LEFT TIBIA AND FIBULA - 2 VIEW COMPARISON:  05/07/2021 FINDINGS: Frontal and lateral views of the left tibia and fibula are obtained. No acute fracture. Alignment is anatomic. Soft tissues are unremarkable. IMPRESSION: 1. Unremarkable left tibia and fibula. Electronically Signed   By: Bobbye Burrow M.D.   On: 03/04/2024 20:25   DG Ankle Complete Left Result Date: 03/04/2024 CLINICAL DATA:  Marvell Slider, pain EXAM: LEFT ANKLE COMPLETE  - 3+ VIEW COMPARISON:  04/06/2023 FINDINGS: Frontal, oblique, lateral views of the left ankle are obtained. No fracture, subluxation, or dislocation. Joint spaces are relatively well preserved. Soft tissues are unremarkable. IMPRESSION: 1. Unremarkable left ankle. Electronically Signed   By: Bobbye Burrow M.D.   On: 03/04/2024 20:24   DG Knee Complete 4 Views Left Result Date: 03/04/2024 CLINICAL DATA:  Marvell Slider, left knee pain EXAM: LEFT KNEE - COMPLETE 4+ VIEW COMPARISON:  05/07/2021 FINDINGS: Frontal, bilateral oblique, lateral views are obtained. No fracture, subluxation, or dislocation. Mild medial and patellofemoral compartmental osteoarthritis. No joint effusion. Soft tissues are unremarkable. IMPRESSION: 1. Mild osteoarthritis.  No acute fracture. Electronically Signed   By: Bobbye Burrow M.D.   On: 03/04/2024 20:23     Procedures   Medications Ordered in the ED  bacitracin ointment (1 Application Topical Given 03/04/24 2350)  Tdap (BOOSTRIX ) injection 0.5 mL (0.5 mLs Intramuscular Given 03/04/24 2351)  naproxen  (NAPROSYN ) tablet 500 mg (500 mg Oral Given 03/04/24 2350)  oxyCODONE -acetaminophen  (PERCOCET/ROXICET) 5-325 MG per tablet 2 tablet (2 tablets Oral Given 03/04/24 2351)                                    Medical Decision Making Risk OTC drugs. Prescription drug management.   No injuries on my interpretation of the xr. Soft tissue injuries on exam. Wound care per nursing. Tdap updated.      Final diagnoses:  Fall, initial encounter  Pain of left lower extremity    ED Discharge Orders     None          Corydon Schweiss, Reymundo Caulk, MD 03/05/24 [phone]
